# Patient Record
Sex: Female | Born: 1935
Health system: Southern US, Community
[De-identification: ages and names within clinical notes are randomized; demographics above are authoritative.]

## PROBLEM LIST (undated history)

## (undated) DIAGNOSIS — K745 Biliary cirrhosis, unspecified: Secondary | ICD-10-CM

## (undated) DIAGNOSIS — M109 Gout, unspecified: Secondary | ICD-10-CM

## (undated) DIAGNOSIS — Z85828 Personal history of other malignant neoplasm of skin: Secondary | ICD-10-CM

## (undated) DIAGNOSIS — N183 Chronic kidney disease, stage 3 unspecified: Secondary | ICD-10-CM

## (undated) DIAGNOSIS — E785 Hyperlipidemia, unspecified: Secondary | ICD-10-CM

## (undated) DIAGNOSIS — C44301 Unspecified malignant neoplasm of skin of nose: Secondary | ICD-10-CM

## (undated) DIAGNOSIS — L0390 Cellulitis, unspecified: Secondary | ICD-10-CM

## (undated) DIAGNOSIS — C44701 Unspecified malignant neoplasm of skin of unspecified lower limb, including hip: Secondary | ICD-10-CM

## (undated) DIAGNOSIS — E039 Hypothyroidism, unspecified: Secondary | ICD-10-CM

## (undated) DIAGNOSIS — K449 Diaphragmatic hernia without obstruction or gangrene: Secondary | ICD-10-CM

## (undated) DIAGNOSIS — N2581 Secondary hyperparathyroidism of renal origin: Secondary | ICD-10-CM

## (undated) DIAGNOSIS — Z8719 Personal history of other diseases of the digestive system: Secondary | ICD-10-CM

## (undated) DIAGNOSIS — I10 Essential (primary) hypertension: Secondary | ICD-10-CM

## (undated) HISTORY — DX: Chronic kidney disease, stage 3 (moderate): N18.3

## (undated) HISTORY — DX: Secondary hyperparathyroidism of renal origin: N25.81

## (undated) HISTORY — DX: Unspecified malignant neoplasm of skin of unspecified lower limb, including hip: C44.701

## (undated) HISTORY — PX: CATARACT EXTRACTION: SUR2

## (undated) HISTORY — PX: HEMORRHOID SURGERY: SHX153

## (undated) HISTORY — DX: Personal history of other diseases of the digestive system: Z87.19

## (undated) HISTORY — DX: Personal history of other malignant neoplasm of skin: Z85.828

## (undated) HISTORY — DX: Diaphragmatic hernia without obstruction or gangrene: K44.9

## (undated) HISTORY — DX: Unspecified malignant neoplasm of skin of nose: C44.301

## (undated) HISTORY — DX: Cellulitis, unspecified: L03.90

## (undated) HISTORY — DX: Hyperlipidemia, unspecified: E78.5

## (undated) HISTORY — DX: Hypothyroidism, unspecified: E03.9

## (undated) HISTORY — DX: Gout, unspecified: M10.9

## (undated) HISTORY — DX: Chronic kidney disease, stage 3 unspecified: N18.30

---

## 1993-09-26 HISTORY — PX: ABDOMINAL HYSTERECTOMY: SHX81

## 1997-09-26 HISTORY — PX: THYROID SURGERY: SHX805

## 1998-09-26 DIAGNOSIS — Z8719 Personal history of other diseases of the digestive system: Secondary | ICD-10-CM

## 1998-09-26 HISTORY — DX: Personal history of other diseases of the digestive system: Z87.19

## 1998-09-26 HISTORY — PX: FOOT SURGERY: SHX648

## 1998-11-05 ENCOUNTER — Ambulatory Visit (HOSPITAL_COMMUNITY): Admission: RE | Admit: 1998-11-05 | Discharge: 1998-11-05 | Payer: Self-pay

## 1999-11-09 ENCOUNTER — Ambulatory Visit (HOSPITAL_COMMUNITY): Admission: RE | Admit: 1999-11-09 | Discharge: 1999-11-09 | Payer: Self-pay | Admitting: Family Medicine

## 2004-11-13 ENCOUNTER — Emergency Department: Payer: Self-pay | Admitting: Unknown Physician Specialty

## 2004-11-14 ENCOUNTER — Ambulatory Visit: Payer: Self-pay

## 2006-06-12 ENCOUNTER — Ambulatory Visit: Payer: Self-pay | Admitting: Internal Medicine

## 2008-08-28 ENCOUNTER — Ambulatory Visit: Payer: Self-pay | Admitting: Internal Medicine

## 2012-12-26 ENCOUNTER — Ambulatory Visit: Payer: Self-pay | Admitting: Unknown Physician Specialty

## 2012-12-26 LAB — CREATININE, SERUM
Creatinine: 1.26 mg/dL (ref 0.60–1.30)
EGFR (African American): 47 — ABNORMAL LOW
EGFR (Non-African Amer.): 40 — ABNORMAL LOW

## 2014-06-03 ENCOUNTER — Ambulatory Visit: Payer: Self-pay | Admitting: Family Medicine

## 2014-06-03 DIAGNOSIS — R0602 Shortness of breath: Secondary | ICD-10-CM

## 2014-06-26 DIAGNOSIS — K449 Diaphragmatic hernia without obstruction or gangrene: Secondary | ICD-10-CM | POA: Insufficient documentation

## 2014-06-26 HISTORY — DX: Diaphragmatic hernia without obstruction or gangrene: K44.9

## 2014-07-21 ENCOUNTER — Ambulatory Visit: Payer: Self-pay | Admitting: Family Medicine

## 2014-09-05 ENCOUNTER — Ambulatory Visit: Payer: Self-pay | Admitting: Family Medicine

## 2015-03-10 ENCOUNTER — Emergency Department
Admission: EM | Admit: 2015-03-10 | Discharge: 2015-03-10 | Disposition: A | Discharge status: Home / Self Care | Payer: Commercial Managed Care - HMO | Attending: Emergency Medicine | Admitting: Emergency Medicine

## 2015-03-10 ENCOUNTER — Encounter: Payer: Self-pay | Admitting: Emergency Medicine

## 2015-03-10 ENCOUNTER — Telehealth: Payer: Self-pay | Admitting: Family Medicine

## 2015-03-10 DIAGNOSIS — J029 Acute pharyngitis, unspecified: Secondary | ICD-10-CM | POA: Diagnosis present

## 2015-03-10 DIAGNOSIS — I10 Essential (primary) hypertension: Secondary | ICD-10-CM | POA: Diagnosis not present

## 2015-03-10 DIAGNOSIS — J209 Acute bronchitis, unspecified: Secondary | ICD-10-CM | POA: Diagnosis not present

## 2015-03-10 HISTORY — DX: Essential (primary) hypertension: I10

## 2015-03-10 HISTORY — DX: Biliary cirrhosis, unspecified: K74.5

## 2015-03-10 LAB — POCT RAPID STREP A: Streptococcus, Group A Screen (Direct): NEGATIVE

## 2015-03-10 MED ORDER — MAGIC MOUTHWASH W/LIDOCAINE: 5.0000 mL | Freq: Four times a day (QID) | ORAL | Status: DC

## 2015-03-10 MED ORDER — PSEUDOEPH-BROMPHEN-DM 30-2-10 MG/5ML PO SYRP
5.0000 mL | ORAL_SOLUTION | Freq: Four times a day (QID) | ORAL | Status: DC | PRN
Start: 2015-03-10 — End: 2015-06-19

## 2015-03-10 MED ORDER — SULFAMETHOXAZOLE-TRIMETHOPRIM 800-160 MG PO TABS: 1.0000 | ORAL_TABLET | Freq: Two times a day (BID) | ORAL | Status: DC

## 2015-03-10 NOTE — Telephone Encounter (Signed)
No, I would not have any idea what to call in for her without evaluating her I agree with previous recommendation for urgent care We hope she is feeling better soon

## 2015-03-10 NOTE — ED Provider Notes (Signed)
[facility] Emergency Department Provider Note  ____________________________________________  Time seen: Approximately 12:39 PM  I have reviewed the triage vital signs and the nursing notes.   HISTORY  Chief Complaint Sore Throat    HPI Caitlin Jenkins is a 81 y.o. female chief complaint of sore throat for 3 days. Patient states she has a productive greenish brown cough. Patient states she's been having fever but no chills she denies any nausea vomiting diarrhea. Patient states she's been using throat spray and warm salt water gargles but has not helped at this time.   Past Medical History  Diagnosis Date  . Hypertension   . Biliary cirrhosis     There are no active problems to display for this patient.   History reviewed. No pertinent past surgical history.  No current outpatient prescriptions on file.  Allergies Review of patient's allergies indicates not on file.  No family history on file.  Social History History  Substance Use Topics  . Smoking status: Never Smoker   . Smokeless tobacco: Not on file  . Alcohol Use: No    Review of Systems Constitutional: No fever/chills Eyes: No visual changes. ENT sore throat Cardiovascular: Denies chest pain. Respiratory: Denies shortness of breath. Productive cough Gastrointestinal: No abdominal pain.  No nausea, no vomiting.  No diarrhea.  No constipation. Genitourinary: Negative for dysuria. Musculoskeletal: Negative for back pain. Skin: Negative for rash. Neurological: Negative for headaches, focal weakness or numbness. 10-point ROS otherwise negative.  ____________________________________________   PHYSICAL EXAM:  VITAL SIGNS: ED Triage Vitals  Enc Vitals Group     BP 03/10/15 1214 136/69 mmHg     Pulse --      Resp 03/10/15 1214 18     Temp 03/10/15 1214 97.9 F (36.6 C)     Temp Source 03/10/15 1214 Oral     SpO2 03/10/15 1214 97 %     Weight 03/10/15 1214 161 lb (73.029  kg)     Height 03/10/15 1214 5\' 5"  (1.651 m)     Head Cir --      Peak Flow --      Pain Score 03/10/15 1222 5     Pain Loc --      Pain Edu? --      Excl. in [place]? --     Constitutional: Alert and oriented. Well appearing and in no acute distress. Eyes: Conjunctivae are normal. PERRL. EOMI. Head: Atraumatic. Nose: No congestion/rhinnorhea. Mouth/Throat: Mucous membranes are moist.  Oropharynx non-erythematous. Neck: No stridor.  No deformity for nuchal range of motion nontender palpation. Hematological/Lymphatic/Immunilogical: No cervical lymphadenopathy. Cardiovascular: Normal rate, regular rhythm. Grossly normal heart sounds.  Good peripheral circulation. Respiratory: Normal respiratory effort.  No retractions. Lungs CTAB. Gastrointestinal: Soft and nontender. No distention. No abdominal bruits. No CVA tenderness. Musculoskeletal: No lower extremity tenderness nor edema.  No joint effusions. Neurologic:  Normal speech and language. No gross focal neurologic deficits are appreciated. Speech is normal. No gait instability. Skin:  Skin is warm, dry and intact. No rash noted. Psychiatric: Mood and affect are normal. Speech and behavior are normal.  ____________________________________________   LABS (all labs ordered are listed, but only abnormal results are displayed)  Labs Reviewed  CULTURE, GROUP A STREP ([facility] ONLY)   ____________________________________________  EKG  ____________________________________________  RADIOLOGY   ____________________________________________   PROCEDURES  Procedure(s) performed: None  Critical Care performed: No  ____________________________________________   INITIAL IMPRESSION / ASSESSMENT AND PLAN / ED COURSE  Pertinent labs &  imaging results that were available during my care of the patient were reviewed by me and considered in my medical decision making (see chart for details).  Pharyngitis and  bronchitis. ____________________________________________   FINAL CLINICAL IMPRESSION(S) / ED DIAGNOSES  Final diagnoses:  Acute pharyngitis, unspecified pharyngitis type  Acute bronchitis, unspecified organism      Sable Feil, PA-C 03/10/15 [place], PA-C 03/10/15 [place], MD 03/10/15 [phone]

## 2015-03-10 NOTE — Telephone Encounter (Signed)
Left message for patient to go to urgent care, no antibiotics called in without an appt.

## 2015-03-10 NOTE — ED Notes (Signed)
Pt reports that she developed a sore throat on Sunday. Pt has a cough that the phlem is greenish brown. She states that she has been running a fever. She has been using throat spray warm salt water and it isnt helping. She is able to speak in complete clear sentences.

## 2015-03-10 NOTE — Discharge Instructions (Signed)

## 2015-03-10 NOTE — Telephone Encounter (Signed)
Pt called stated she is very sick. Pt states she is having fever, chills, and a bad sore throat. I instructed pt we do not have any openings today and she may want to [facility] or an urgent care. Pt wants to know if something can be called in for her. Pharm is [facility]. Thanks.

## 2015-03-12 DIAGNOSIS — N183 Chronic kidney disease, stage 3 unspecified: Secondary | ICD-10-CM | POA: Insufficient documentation

## 2015-03-12 DIAGNOSIS — I10 Essential (primary) hypertension: Secondary | ICD-10-CM | POA: Insufficient documentation

## 2015-03-12 DIAGNOSIS — M109 Gout, unspecified: Secondary | ICD-10-CM | POA: Insufficient documentation

## 2015-03-12 DIAGNOSIS — E039 Hypothyroidism, unspecified: Secondary | ICD-10-CM | POA: Insufficient documentation

## 2015-03-12 DIAGNOSIS — E785 Hyperlipidemia, unspecified: Secondary | ICD-10-CM | POA: Insufficient documentation

## 2015-03-12 LAB — CULTURE, GROUP A STREP (THRC)

## 2015-03-13 ENCOUNTER — Encounter: Payer: Self-pay | Admitting: Family Medicine

## 2015-03-13 ENCOUNTER — Ambulatory Visit
Admission: RE | Admit: 2015-03-13 | Discharge: 2015-03-13 | Disposition: A | Discharge status: Home / Self Care | Payer: Commercial Managed Care - HMO | Source: Ambulatory Visit | Attending: Family Medicine | Admitting: Family Medicine

## 2015-03-13 ENCOUNTER — Ambulatory Visit (INDEPENDENT_AMBULATORY_CARE_PROVIDER_SITE_OTHER): Payer: Commercial Managed Care - HMO | Admitting: Family Medicine

## 2015-03-13 VITALS — BP 132/78 | HR 87 | Temp 97.6°F | Ht 63.0 in | Wt 161.0 lb

## 2015-03-13 DIAGNOSIS — J180 Bronchopneumonia, unspecified organism: Secondary | ICD-10-CM | POA: Diagnosis not present

## 2015-03-13 DIAGNOSIS — J189 Pneumonia, unspecified organism: Secondary | ICD-10-CM | POA: Diagnosis not present

## 2015-03-13 DIAGNOSIS — M1A0720 Idiopathic chronic gout, left ankle and foot, without tophus (tophi): Secondary | ICD-10-CM

## 2015-03-13 DIAGNOSIS — K449 Diaphragmatic hernia without obstruction or gangrene: Secondary | ICD-10-CM | POA: Insufficient documentation

## 2015-03-13 DIAGNOSIS — I10 Essential (primary) hypertension: Secondary | ICD-10-CM | POA: Diagnosis not present

## 2015-03-13 DIAGNOSIS — D485 Neoplasm of uncertain behavior of skin: Secondary | ICD-10-CM

## 2015-03-13 DIAGNOSIS — K743 Primary biliary cirrhosis: Secondary | ICD-10-CM | POA: Diagnosis not present

## 2015-03-13 DIAGNOSIS — E785 Hyperlipidemia, unspecified: Secondary | ICD-10-CM

## 2015-03-13 DIAGNOSIS — R05 Cough: Secondary | ICD-10-CM | POA: Diagnosis not present

## 2015-03-13 DIAGNOSIS — N183 Chronic kidney disease, stage 3 unspecified: Secondary | ICD-10-CM

## 2015-03-13 DIAGNOSIS — R059 Cough, unspecified: Secondary | ICD-10-CM

## 2015-03-13 DIAGNOSIS — E890 Postprocedural hypothyroidism: Secondary | ICD-10-CM

## 2015-03-13 NOTE — Assessment & Plan Note (Signed)
Suspect acute attack; check uric acid; avoid purines

## 2015-03-13 NOTE — Assessment & Plan Note (Signed)
BP controlled; avoid HCTZ

## 2015-03-13 NOTE — Progress Notes (Signed)
BP 132/78 mmHg  Pulse 87  Temp(Src) 97.6 F (36.4 C)  Ht 5\' 3"  (1.6 m)  Wt 161 lb (73.029 kg)  BMI 28.53 kg/m2  SpO2 96%   Subjective:    Patient ID: Caitlin Jenkins, female    DOB: Mar 04, 1932, 81 y.o.   MRN: [id number]  HPI: Caitlin Jenkins is a 81 y.o. female  Chief Complaint  Patient presents with  . URI     since Sunday, went to the ER and was given meds  . Foot Injury    knot on foot, gout?   She has been sick since Sunday, today is Friday Coughing and coughing, bringing up green ugly stuff; fever and chills She doesn't think she has been sicker than she has been this week She went to the ER earlier this week; was seen there and is better now; she was put on doxycycline; no chest xray; throat tests were negative for strep; throat is better now No fevers since starting antibiotic but had some chills No rash No travel No recent visits to hospital or nursing home Daughter was with her on trip to [place] and had a sore throat, no bronchitis; one of daughter's grandchildren who lived with her had had strep  She also has something on her left foot and right index finger; ankles hurt so bad, all she could to walk to the bathroom; was using a walker yesterday; right index finger swollen and painful and limited ROM with flexion; tender spot on medial left foot; she used up all of her biofreeze and used some arthritis cream on her foot twice; quit using it when it turned red; not taking any gout medicine though; previous attack of gout was a long time ago since eating cherries; she says she is the "gout queen"; taking tylenol; has colchicine to take when flares come up  She has been taking only one of the ursodiol and would like liver tests checked again  Relevant past medical, surgical, family and social history reviewed and updated as indicated. Interim medical history since our last visit reviewed. Allergies and medications reviewed and updated.  Review of Systems   Constitutional: Positive for fever (but not since starting antibiotics), chills and fatigue.  HENT: Positive for sneezing (just once in a while) and sore throat. Negative for ear pain and mouth sores.   Eyes: Positive for discharge (worse in the mornings).  Respiratory: Positive for cough (productive of greenish sputum) and shortness of breath (trying to take deep breaths).   Skin:       Bump on the right ear, been there six months   Per HPI unless specifically indicated above  The patient does not have a history of falls Depression screen [facility] 2/9 03/13/2015  Decreased Interest 0  Down, Depressed, Hopeless 0  PHQ - 2 Score 0       Objective:    BP 132/78 mmHg  Pulse 87  Temp(Src) 97.6 F (36.4 C)  Ht 5\' 3"  (1.6 m)  Wt 161 lb (73.029 kg)  BMI 28.53 kg/m2  SpO2 96%  Wt Readings from Last 3 Encounters:  03/13/15 161 lb (73.029 kg)  11/24/14 167 lb (75.751 kg)  03/10/15 161 lb (73.029 kg)    Physical Exam  Constitutional: She appears well-developed and well-nourished. No distress.  HENT:  Head: Normocephalic and atraumatic.  Right Ear: There is drainage and swelling. Tympanic membrane is injected, erythematous and retracted. A middle ear effusion is present. Decreased hearing is noted.  Left Ear: There is drainage and swelling. Tympanic membrane is injected, erythematous and retracted. A middle ear effusion is present. Decreased hearing is noted.  Nose: No mucosal edema, rhinorrhea or septal deviation. No epistaxis.  Mouth/Throat: Uvula is midline, oropharynx is clear and moist and mucous membranes are normal.  Slightly erythematous papule on the lower antehelix of the right ear, telangiectasia present  Eyes: EOM are normal. No scleral icterus.  Neck: Neck supple. No tracheal deviation present. No thyromegaly present.  Cardiovascular: Normal rate, regular rhythm and normal heart sounds.   No murmur heard. Pulmonary/Chest: Effort normal. No respiratory distress. She has no  wheezes. She has rales (right lower lobe with decreased breath sounds).  Abdominal: Soft. She exhibits no distension.  Musculoskeletal: Normal range of motion. She exhibits no edema.  Lymphadenopathy:    She has no cervical adenopathy.  Neurological: She is alert. She exhibits normal muscle tone.  Skin: Skin is warm and dry. She is not diaphoretic. No pallor.  Psychiatric: She has a normal mood and affect. Her behavior is normal. Judgment and thought content normal.   Reviewed ER labs: Results for orders placed or performed during the hospital encounter of 03/10/15  Culture, group A strep ([place] only)  Result Value Ref Range   Specimen Description THROAT    Special Requests NONE    Culture      NO BETA HEMOLYTIC STREPTOCOCCI ISOLATED IN 64 HOURS   Report Status 03/12/2015 FINAL   POCT rapid strep A [facility])  Result Value Ref Range   Streptococcus, Group A Screen (Direct) NEGATIVE NEGATIVE      Assessment & Plan:   Problem List Items Addressed This Visit      Cardiovascular and Mediastinum   Hypertension    BP controlled; avoid HCTZ        Digestive   Primary biliary cirrhosis    Pt only taking ursodiol once a day; check levels of liver enzymes at her request      Relevant Orders   Comprehensive metabolic panel     Endocrine   Hypothyroidism    Last TSH in Feb was normal; check yearly or if symptomatic        Genitourinary   CKD (chronic kidney disease) stage 3, GFR 30-59 ml/min    Avoid NSAIDs; stay hydrated; check GFR and Cr today        Other   Hyperlipidemia    Patient wishes to avoid pills; avoid / limit saturated fats; politely declined offer to check lipids today      Gout    Suspect acute attack; check uric acid; avoid purines      Relevant Orders   Uric acid    Other Visit Diagnoses    Cough    -  Primary    Relevant Orders    DG Chest 2 View    CBC with Differential/Platelet    Bronchopneumonia        Relevant Medications     doxycycline (VIBRAMYCIN) 100 MG capsule    Other Relevant Orders    DG Chest 2 View    CBC with Differential/Platelet    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 doxycycline (VIBRAMYCIN) 100 MG capsule  Sig:   . DISCONTD: STOOL SOFTENER 100 MG capsule    Sig: 2 (two) times daily.  . Multiple Vitamins-Minerals (SENIOR MULTIVITAMIN PLUS PO)    Sig:      Follow up plan: Return in about 3 months (around 06/13/2015) for regular follow-up.

## 2015-03-13 NOTE — Assessment & Plan Note (Signed)
Patient wishes to avoid pills; avoid / limit saturated fats; politely declined offer to check lipids today

## 2015-03-13 NOTE — Assessment & Plan Note (Signed)
Pt only taking ursodiol once a day; check levels of liver enzymes at her request

## 2015-03-13 NOTE — Assessment & Plan Note (Signed)
Last TSH in Feb was normal; check yearly or if symptomatic

## 2015-03-13 NOTE — Assessment & Plan Note (Signed)
Avoid NSAIDs; stay hydrated; check GFR and Cr today

## 2015-03-13 NOTE — Patient Instructions (Addendum)
Please have the chest xray done If you feel worse over the weekend, contact our nurse triage line through our main number [phone] and ask to speak to the doctor covering if needed Hydration, vitamin C, green tea, and rest are recommended We'll let you know about the lab results and chest xray results when they are received Avoid NSAIDs If you need something for aches or pains, try to use Tylenol (acetaminphen) instead of non-steroidals (which include Aleve, ibuprofen, Advil, Motrin, and naproxen); non-steroidals can cause long-term kidney damage You can safely take up to 2,000 mg of acetaminophen if you absolutely need something for pain and/or fever Avoid / limit foods rich in purines (chicken, chicken soup, [place], gravies, etc.)   Gout Gout is an inflammatory arthritis caused by a buildup of uric acid crystals in the joints. Uric acid is a chemical that is normally present in the blood. When the level of uric acid in the blood is too high it can form crystals that deposit in your joints and tissues. This causes joint redness, soreness, and swelling (inflammation). Repeat attacks are common. Over time, uric acid crystals can form into masses (tophi) near a joint, destroying bone and causing disfigurement. Gout is treatable and often preventable. CAUSES  The disease begins with elevated levels of uric acid in the blood. Uric acid is produced by your body when it breaks down a naturally found substance called purines. Certain foods you eat, such as meats and fish, contain high amounts of purines. Causes of an elevated uric acid level include:  Being passed down from parent to child (heredity).  Diseases that cause increased uric acid production (such as obesity, psoriasis, and certain cancers).  Excessive alcohol use.  Diet, especially diets rich in meat and seafood.  Medicines, including certain cancer-fighting medicines (chemotherapy), water pills (diuretics), and aspirin.  Chronic  kidney disease. The kidneys are no longer able to remove uric acid well.  Problems with metabolism. Conditions strongly associated with gout include:  Obesity.  High blood pressure.  High cholesterol.  Diabetes. Not everyone with elevated uric acid levels gets gout. It is not understood why some people get gout and others do not. Surgery, joint injury, and eating too much of certain foods are some of the factors that can lead to gout attacks. SYMPTOMS   An attack of gout comes on quickly. It causes intense pain with redness, swelling, and warmth in a joint.  Fever can occur.  Often, only one joint is involved. Certain joints are more commonly involved:  Base of the big toe.  Knee.  Ankle.  Wrist.  Finger. Without treatment, an attack usually goes away in a few days to weeks. Between attacks, you usually will not have symptoms, which is different from many other forms of arthritis. DIAGNOSIS  Your caregiver will suspect gout based on your symptoms and exam. In some cases, tests may be recommended. The tests may include:  Blood tests.  Urine tests.  X-rays.  Joint fluid exam. This exam requires a needle to remove fluid from the joint (arthrocentesis). Using a microscope, gout is confirmed when uric acid crystals are seen in the joint fluid. TREATMENT  There are two phases to gout treatment: treating the sudden onset (acute) attack and preventing attacks (prophylaxis).  Treatment of an Acute Attack.  Medicines are used. These include anti-inflammatory medicines or steroid medicines.  An injection of steroid medicine into the affected joint is sometimes necessary.  The painful joint is rested. Movement can worsen the  arthritis.  You may use warm or cold treatments on painful joints, depending which works best for you.  Treatment to Prevent Attacks.  If you suffer from frequent gout attacks, your caregiver may advise preventive medicine. These medicines are started  after the acute attack subsides. These medicines either help your kidneys eliminate uric acid from your body or decrease your uric acid production. You may need to stay on these medicines for a very long time.  The early phase of treatment with preventive medicine can be associated with an increase in acute gout attacks. For this reason, during the first few months of treatment, your caregiver may also advise you to take medicines usually used for acute gout treatment. Be sure you understand your caregiver's directions. Your caregiver may make several adjustments to your medicine dose before these medicines are effective.  Discuss dietary treatment with your caregiver or dietitian. Alcohol and drinks high in sugar and fructose and foods such as meat, poultry, and seafood can increase uric acid levels. Your caregiver or dietitian can advise you on drinks and foods that should be limited. HOME CARE INSTRUCTIONS   Do not take aspirin to relieve pain. This raises uric acid levels.  Only take over-the-counter or prescription medicines for pain, discomfort, or fever as directed by your caregiver.  Rest the joint as much as possible. When in bed, keep sheets and blankets off painful areas.  Keep the affected joint raised (elevated).  Apply warm or cold treatments to painful joints. Use of warm or cold treatments depends on which works best for you.  Use crutches if the painful joint is in your leg.  Drink enough fluids to keep your urine clear or pale yellow. This helps your body get rid of uric acid. Limit alcohol, sugary drinks, and fructose drinks.  Follow your dietary instructions. Pay careful attention to the amount of protein you eat. Your daily diet should emphasize fruits, vegetables, whole grains, and fat-free or low-fat milk products. Discuss the use of coffee, vitamin C, and cherries with your caregiver or dietitian. These may be helpful in lowering uric acid levels.  Maintain a healthy  body weight. SEEK MEDICAL CARE IF:   You develop diarrhea, vomiting, or any side effects from medicines.  You do not feel better in 24 hours, or you are getting worse. SEEK IMMEDIATE MEDICAL CARE IF:   Your joint becomes suddenly more tender, and you have chills or a fever. MAKE SURE YOU:   Understand these instructions.  Will watch your condition.  Will get help right away if you are not doing well or get worse. Document Released: 09/09/2000 Document Revised: 01/27/2014 Document Reviewed: 04/25/2012 [facility] Patient Information 2015 [place], [place]. This information is not intended to replace advice given to you by your health care provider. Make sure you discuss any questions you have with your health care provider.

## 2015-03-14 LAB — CBC WITH DIFFERENTIAL/PLATELET
BASOS ABS: 0 10*3/uL (ref 0.0–0.2)
Basos: 0 %
EOS (ABSOLUTE): 0.2 10*3/uL (ref 0.0–0.4)
EOS: 2 %
HEMOGLOBIN: 12.3 g/dL (ref 11.1–15.9)
Hematocrit: 36.9 % (ref 34.0–46.6)
Immature Grans (Abs): 0.2 10*3/uL — ABNORMAL HIGH (ref 0.0–0.1)
Immature Granulocytes: 1 %
Lymphocytes Absolute: 2 10*3/uL (ref 0.7–3.1)
Lymphs: 15 %
MCH: 27.1 pg (ref 26.6–33.0)
MCHC: 33.3 g/dL (ref 31.5–35.7)
MCV: 81 fL (ref 79–97)
MONOS ABS: 1.4 10*3/uL — AB (ref 0.1–0.9)
Monocytes: 10 %
NEUTROS ABS: 9.9 10*3/uL — AB (ref 1.4–7.0)
NEUTROS PCT: 72 %
Platelets: 385 10*3/uL — ABNORMAL HIGH (ref 150–379)
RBC: 4.54 x10E6/uL (ref 3.77–5.28)
RDW: 14.1 % (ref 12.3–15.4)
WBC: 13.8 10*3/uL — ABNORMAL HIGH (ref 3.4–10.8)

## 2015-03-14 LAB — URIC ACID: Uric Acid: 8.3 mg/dL — ABNORMAL HIGH (ref 2.5–7.1)

## 2015-03-14 LAB — COMPREHENSIVE METABOLIC PANEL
A/G RATIO: 1.1 (ref 1.1–2.5)
ALT: 7 IU/L (ref 0–32)
AST: 12 IU/L (ref 0–40)
Albumin: 3.9 g/dL (ref 3.5–4.7)
Alkaline Phosphatase: 128 IU/L — ABNORMAL HIGH (ref 39–117)
BILIRUBIN TOTAL: 0.3 mg/dL (ref 0.0–1.2)
BUN/Creatinine Ratio: 18 (ref 11–26)
BUN: 27 mg/dL (ref 8–27)
CO2: 23 mmol/L (ref 18–29)
Calcium: 9.8 mg/dL (ref 8.7–10.3)
Chloride: 93 mmol/L — ABNORMAL LOW (ref 97–108)
Creatinine, Ser: 1.46 mg/dL — ABNORMAL HIGH (ref 0.57–1.00)
GFR, EST AFRICAN AMERICAN: 39 mL/min/{1.73_m2} — AB (ref >59)
GFR, EST NON AFRICAN AMERICAN: 34 mL/min/{1.73_m2} — AB (ref >59)
GLUCOSE: 106 mg/dL — AB (ref 65–99)
Globulin, Total: 3.4 g/dL (ref 1.5–4.5)
Potassium: 4.3 mmol/L (ref 3.5–5.2)
SODIUM: 129 mmol/L — AB (ref 134–144)
TOTAL PROTEIN: 7.3 g/dL (ref 6.0–8.5)

## 2015-03-16 ENCOUNTER — Telehealth: Payer: Self-pay | Admitting: Family Medicine

## 2015-03-16 DIAGNOSIS — J181 Lobar pneumonia, unspecified organism: Principal | ICD-10-CM

## 2015-03-16 DIAGNOSIS — J189 Pneumonia, unspecified organism: Secondary | ICD-10-CM

## 2015-03-16 NOTE — Telephone Encounter (Signed)
Please let patient know I tried to call her a couple of times Saturday, and there was a problem with her line I think I'd like to see her back in the office next week mid-week to see her again, recheck some labs, and have her repeat a CXR IF she'd like to get the CXR a few hours before her visit or even the day before, we would have it to go over

## 2015-03-17 ENCOUNTER — Other Ambulatory Visit: Payer: Self-pay

## 2015-03-17 DIAGNOSIS — D485 Neoplasm of uncertain behavior of skin: Secondary | ICD-10-CM

## 2015-03-17 MED ORDER — LOSARTAN POTASSIUM 100 MG PO TABS: 100.0000 mg | ORAL_TABLET | Freq: Every day | ORAL | Status: DC

## 2015-03-17 NOTE — Telephone Encounter (Signed)
She needs a 90 day supply of Losartan sent to mail order. She also needs a referral to Dr. Evorn Gong at [facility] for skin check. She has appointment scheduled, just needs referral.

## 2015-03-17 NOTE — Telephone Encounter (Signed)
Patient notified, appointment scheduled for Monday, June 27th. She will go get xray before she comes here.

## 2015-03-20 ENCOUNTER — Telehealth: Payer: Self-pay | Admitting: Family Medicine

## 2015-03-20 ENCOUNTER — Ambulatory Visit: Admission: RE | Admit: 2015-03-20 | Payer: Commercial Managed Care - HMO | Source: Ambulatory Visit

## 2015-03-20 ENCOUNTER — Ambulatory Visit
Admission: RE | Admit: 2015-03-20 | Discharge: 2015-03-20 | Disposition: A | Discharge status: Home / Self Care | Payer: Commercial Managed Care - HMO | Source: Ambulatory Visit | Attending: Family Medicine | Admitting: Family Medicine

## 2015-03-20 DIAGNOSIS — J189 Pneumonia, unspecified organism: Secondary | ICD-10-CM | POA: Diagnosis not present

## 2015-03-20 DIAGNOSIS — J181 Lobar pneumonia, unspecified organism: Principal | ICD-10-CM

## 2015-03-20 DIAGNOSIS — K449 Diaphragmatic hernia without obstruction or gangrene: Secondary | ICD-10-CM | POA: Diagnosis not present

## 2015-03-20 NOTE — Telephone Encounter (Signed)
I talked with patient; pneumonia is improving She will come in to appt on monday

## 2015-03-23 ENCOUNTER — Ambulatory Visit (INDEPENDENT_AMBULATORY_CARE_PROVIDER_SITE_OTHER): Payer: Commercial Managed Care - HMO | Admitting: Family Medicine

## 2015-03-23 ENCOUNTER — Encounter: Payer: Self-pay | Admitting: Family Medicine

## 2015-03-23 VITALS — BP 143/78 | HR 86 | Temp 97.5°F | Wt 161.0 lb

## 2015-03-23 DIAGNOSIS — J189 Pneumonia, unspecified organism: Secondary | ICD-10-CM | POA: Diagnosis not present

## 2015-03-23 DIAGNOSIS — E878 Other disorders of electrolyte and fluid balance, not elsewhere classified: Secondary | ICD-10-CM

## 2015-03-23 DIAGNOSIS — E871 Hypo-osmolality and hyponatremia: Secondary | ICD-10-CM

## 2015-03-23 DIAGNOSIS — N183 Chronic kidney disease, stage 3 unspecified: Secondary | ICD-10-CM

## 2015-03-23 DIAGNOSIS — J181 Lobar pneumonia, unspecified organism: Secondary | ICD-10-CM

## 2015-03-23 DIAGNOSIS — M1A0721 Idiopathic chronic gout, left ankle and foot, with tophus (tophi): Secondary | ICD-10-CM

## 2015-03-23 DIAGNOSIS — K743 Primary biliary cirrhosis: Secondary | ICD-10-CM | POA: Diagnosis not present

## 2015-03-23 LAB — MICROALBUMIN, URINE WAIVED
Creatinine, Urine Waived: 50 mg/dL (ref 10–300)
Microalb, Ur Waived: 30 mg/L — ABNORMAL HIGH (ref 0–19)

## 2015-03-23 LAB — MICROSCOPIC EXAMINATION: RBC MICROSCOPIC, UA: NONE SEEN /HPF (ref 0–?)

## 2015-03-23 MED ORDER — LEVOTHYROXINE SODIUM 112 MCG PO TABS
112.0000 ug | ORAL_TABLET | Freq: Every day | ORAL | Status: DC
Start: 2015-03-23 — End: 2015-06-19

## 2015-03-23 NOTE — Assessment & Plan Note (Signed)
Reviewed SGOT, SGPT, and alk phos and much better than last fall; pt to check in with GI doctor to see if appt needed every 6-12 months

## 2015-03-23 NOTE — Assessment & Plan Note (Signed)
Chronic and probably strong genetic component, likely getting worse as kidney function declined; suggest nephrology visit for consultation and medication recommendations; avoid NSAIDs; she is already following a diet low in purines

## 2015-03-23 NOTE — Assessment & Plan Note (Signed)
Avoid NSAIDs; recheck creatinine and GFR today; hydration encourged; refer to kidney doctor for consultation for renal function in conjunction with gout

## 2015-03-23 NOTE — Progress Notes (Signed)
BP 143/78 mmHg  Pulse 86  Temp(Src) 97.5 F (36.4 C)  Wt 161 lb (73.029 kg)  SpO2 98%   Subjective:    Patient ID: Caitlin Jenkins, female    DOB: 1932-01-03, 81 y.o.   MRN: [id number]  HPI: KALIANA ALBINO is a 81 y.o. female  Chief Complaint  Patient presents with  . Pneumonia    recheck   Patient here for f/u of RLL pneumonia; feeling much better Energy is back to about 85% of the original level No discolored phlegm No fevers No rash Finished up antibiotics on Thursday She had her f/u CXR last week, already discussed  She's had gout chronically for years Reviewed uric acid; last gout flare a week ago or a little more; while sick with pneumonia; she is really watching her diet, knows to avoid [place] and gravies, etc.  CKD, stage 3; avoiding NSAIDs; reviewed recent readings; she has never seen a kidney doctor   Relevant past medical, surgical, family and social history reviewed and updated as indicated. Interim medical history since our last visit reviewed. Allergies and medications reviewed and updated.  Review of Systems  Constitutional: Positive for fatigue (energy level back to 85% original level). Negative for fever.   Per HPI unless specifically indicated above     Objective:    BP 143/78 mmHg  Pulse 86  Temp(Src) 97.5 F (36.4 C)  Wt 161 lb (73.029 kg)  SpO2 98%  Wt Readings from Last 3 Encounters:  03/23/15 161 lb (73.029 kg)  03/13/15 161 lb (73.029 kg)  11/24/14 167 lb (75.751 kg)    Physical Exam  Constitutional: She appears well-developed and well-nourished. No distress.  HENT:  Head: Normocephalic and atraumatic.  Mouth/Throat: No oropharyngeal exudate.  Eyes: EOM are normal. No scleral icterus.  Neck: No JVD present.  Pulmonary/Chest: Effort normal and breath sounds normal. No respiratory distress. She has no wheezes. She has no rales.  Abdominal: She exhibits no distension.  Musculoskeletal: She exhibits no edema.       Left  foot: There is deformity (bony enlargement medial aspect left foot).  Neurological: She is alert.  Skin: She is not diaphoretic.   Results for orders placed or performed in visit on 03/13/15  Uric acid  Result Value Ref Range   Uric Acid 8.3 (H) 2.5 - 7.1 mg/dL  Comprehensive metabolic panel  Result Value Ref Range   Glucose 106 (H) 65 - 99 mg/dL   BUN 27 8 - 27 mg/dL   Creatinine, Ser 1.46 (H) 0.57 - 1.00 mg/dL   GFR calc non Af Amer 34 (L) >59 mL/min/1.73   GFR calc Af Amer 39 (L) >59 mL/min/1.73   BUN/Creatinine Ratio 18 11 - 26   Sodium 129 (L) 134 - 144 mmol/L   Potassium 4.3 3.5 - 5.2 mmol/L   Chloride 93 (L) 97 - 108 mmol/L   CO2 23 18 - 29 mmol/L   Calcium 9.8 8.7 - 10.3 mg/dL   Total Protein 7.3 6.0 - 8.5 g/dL   Albumin 3.9 3.5 - 4.7 g/dL   Globulin, Total 3.4 1.5 - 4.5 g/dL   Albumin/Globulin Ratio 1.1 1.1 - 2.5   Bilirubin Total 0.3 0.0 - 1.2 mg/dL   Alkaline Phosphatase 128 (H) 39 - 117 IU/L   AST 12 0 - 40 IU/L   ALT 7 0 - 32 IU/L  CBC with Differential/Platelet  Result Value Ref Range   WBC 13.8 (H) 3.4 - 10.8 x10E3/uL  RBC 4.54 3.77 - 5.28 x10E6/uL   Hemoglobin 12.3 11.1 - 15.9 g/dL   Hematocrit 36.9 34.0 - 46.6 %   MCV 81 79 - 97 fL   MCH 27.1 26.6 - 33.0 pg   MCHC 33.3 31.5 - 35.7 g/dL   RDW 14.1 12.3 - 15.4 %   Platelets 385 (H) 150 - 379 x10E3/uL   NEUTROPHILS 72 %   Lymphs 15 %   Monocytes 10 %   Eos 2 %   Basos 0 %   Neutrophils Absolute 9.9 (H) 1.4 - 7.0 x10E3/uL   Lymphocytes Absolute 2.0 0.7 - 3.1 x10E3/uL   Monocytes Absolute 1.4 (H) 0.1 - 0.9 x10E3/uL   EOS (ABSOLUTE) 0.2 0.0 - 0.4 x10E3/uL   Basophils Absolute 0.0 0.0 - 0.2 x10E3/uL   Immature Granulocytes 1 %   Immature Grans (Abs) 0.2 (H) 0.0 - 0.1 x10E3/uL      Assessment & Plan:   Problem List Items Addressed This Visit      Digestive   Primary biliary cirrhosis    Reviewed SGOT, SGPT, and alk phos and much better than last fall; pt to check in with GI doctor to see if appt  needed every 6-12 months        Genitourinary   CKD (chronic kidney disease) stage 3, GFR 30-59 ml/min    Avoid NSAIDs; recheck creatinine and GFR today; hydration encourged; refer to kidney doctor for consultation for renal function in conjunction with gout      Relevant Orders   Basic Metabolic Panel (BMET)   UA/M w/rflx Culture, Routine   Microalbumin / creatinine urine ratio     Other   Gout - Primary    Chronic and probably strong genetic component, likely getting worse as kidney function declined; suggest nephrology visit for consultation and medication recommendations; avoid NSAIDs; she is already following a diet low in purines       Other Visit Diagnoses    Right lower lobe pneumonia        so glad this is resolving; PCV-13 and PPSV-23 are up-to-date    Hyponatremia        recheck BMP today, suspect related to recent illness    Hypochloremia        recheck BMP today, suspect related to recent illness       Orders Placed This Encounter  Procedures  . Basic Metabolic Panel (BMET)  . UA/M w/rflx Culture, Routine  . Microalbumin / creatinine urine ratio    Follow up plan: Return in about 3 months (around 06/23/2015) for regular follow-up. An After Visit Summary was printed and given to the patient.

## 2015-03-23 NOTE — Patient Instructions (Signed)
We'll let you know about the appointment to see the kidney doctor (kidney function and gout) We'll let you know about the lab results Avoid NSAIDs and just use acetaminophen if needed for aches and pains, limit to 2,000 mg per day Do call Dr. Vira Agar to see if he'd like to see you every 6-12 months or so

## 2015-03-24 LAB — BASIC METABOLIC PANEL
BUN/Creatinine Ratio: 23 (ref 11–26)
BUN: 26 mg/dL (ref 8–27)
CO2: 19 mmol/L (ref 18–29)
CREATININE: 1.11 mg/dL — AB (ref 0.57–1.00)
Calcium: 9.4 mg/dL (ref 8.7–10.3)
Chloride: 98 mmol/L (ref 97–108)
GFR, EST AFRICAN AMERICAN: 54 mL/min/{1.73_m2} — AB (ref >59)
GFR, EST NON AFRICAN AMERICAN: 47 mL/min/{1.73_m2} — AB (ref >59)
GLUCOSE: 103 mg/dL — AB (ref 65–99)
Potassium: 5.3 mmol/L — ABNORMAL HIGH (ref 3.5–5.2)
Sodium: 136 mmol/L (ref 134–144)

## 2015-03-25 LAB — UA/M W/RFLX CULTURE, ROUTINE

## 2015-06-15 ENCOUNTER — Ambulatory Visit: Payer: Commercial Managed Care - HMO | Admitting: Family Medicine

## 2015-06-19 ENCOUNTER — Encounter: Payer: Self-pay | Admitting: Family Medicine

## 2015-06-19 ENCOUNTER — Ambulatory Visit: Payer: Commercial Managed Care - HMO | Admitting: Family Medicine

## 2015-06-19 ENCOUNTER — Ambulatory Visit (INDEPENDENT_AMBULATORY_CARE_PROVIDER_SITE_OTHER): Payer: Commercial Managed Care - HMO | Admitting: Family Medicine

## 2015-06-19 VITALS — BP 156/89 | HR 68 | Ht 63.25 in | Wt 164.0 lb

## 2015-06-19 DIAGNOSIS — J189 Pneumonia, unspecified organism: Secondary | ICD-10-CM | POA: Diagnosis not present

## 2015-06-19 DIAGNOSIS — Z23 Encounter for immunization: Secondary | ICD-10-CM | POA: Diagnosis not present

## 2015-06-19 DIAGNOSIS — I10 Essential (primary) hypertension: Secondary | ICD-10-CM | POA: Diagnosis not present

## 2015-06-19 DIAGNOSIS — R809 Proteinuria, unspecified: Secondary | ICD-10-CM | POA: Insufficient documentation

## 2015-06-19 DIAGNOSIS — R55 Syncope and collapse: Secondary | ICD-10-CM | POA: Diagnosis not present

## 2015-06-19 DIAGNOSIS — E890 Postprocedural hypothyroidism: Secondary | ICD-10-CM

## 2015-06-19 DIAGNOSIS — N183 Chronic kidney disease, stage 3 unspecified: Secondary | ICD-10-CM

## 2015-06-19 DIAGNOSIS — K743 Primary biliary cirrhosis: Secondary | ICD-10-CM

## 2015-06-19 DIAGNOSIS — E875 Hyperkalemia: Secondary | ICD-10-CM | POA: Diagnosis not present

## 2015-06-19 DIAGNOSIS — C44202 Unspecified malignant neoplasm of skin of right ear and external auricular canal: Secondary | ICD-10-CM

## 2015-06-19 DIAGNOSIS — J181 Lobar pneumonia, unspecified organism: Secondary | ICD-10-CM

## 2015-06-19 MED ORDER — LEVOTHYROXINE SODIUM 112 MCG PO TABS: 112.0000 ug | ORAL_TABLET | Freq: Every day | ORAL | Status: DC

## 2015-06-19 MED ORDER — AMLODIPINE BESYLATE 10 MG PO TABS: 10.0000 mg | ORAL_TABLET | Freq: Every day | ORAL | Status: DC

## 2015-06-19 MED ORDER — LOSARTAN POTASSIUM 100 MG PO TABS: 100.0000 mg | ORAL_TABLET | Freq: Every day | ORAL | Status: DC

## 2015-06-19 NOTE — Assessment & Plan Note (Signed)
Last TSH reviewed, normal; due for TSH recheck in Feb 2017 or just after; refills of meds given

## 2015-06-19 NOTE — Assessment & Plan Note (Signed)
She is awaiting Moh's procedure for this (likely [place])

## 2015-06-19 NOTE — Assessment & Plan Note (Signed)
Avoid fruit smoothies, high-K+ foods, explained why with her being on an ARB that she can retain more potassium than another person not on ARB

## 2015-06-19 NOTE — Progress Notes (Signed)
BP 156/89 mmHg  Pulse 68  Ht 5' 3.25" (1.607 m)  Wt 164 lb (74.39 kg)  BMI 28.81 kg/m2  SpO2 99%   Subjective:    Patient ID: Caitlin Jenkins, female    DOB: 12-11-1931, 81 y.o.   MRN: [id number]  HPI: Caitlin Jenkins is a 80 y.o. female  Chief Complaint  Patient presents with  . Hypertension    Needs refills Losartin and Amodipine  . Hypothyroidism    Needs refills on med   She went to see Dr. Vira Agar; she thinks she'll doing well; he said so too; kept her on the medicine; we could not find his August note in [place] or Care Everywhere; he will see again in March, no date set yet; she wonders if she needs another referral; soreness across her abdomen, sore; she did not talk to Dr. Vira Agar about it; he did check her liver and said it was not enlarged; she says she will just watch it, did not want to do anything  She is going to a new dentist; not sure if she needs a referral; she just goes once a year; no dental problems  She saw Dr. Evorn Gong, every six months for follow-up; hx of skin cancer and goes for regular follow-ups; he sent her to [facility] specialist but not in the network; she talked to [facility], and they have called her back from [facility] and it has been approved, she will go in October; she was advised by me to call [facility] and confirm; right ear (tragus)  Hypertension; insurance stuff gets her upset; she does not check blood pressure at home; trying to watch salt intake; does not resalt anything; does not cook much with salt, it would be in the food that eats out; does not want to cook often for just one person; she uses loratidine, just plain, no decongestants; has some postnasal drip and phlegm, maybe coming from milk  Hypothyroidism; weight has been stable, lost 10 pounds with pneumonia and kept it off; always has constipation, takes two colace every day; last colonoscopy years ago, does not want another; no blood in the stool  She was walking along in a store, then everything  went black as could be, like she would have passed out or fallen; her lips felt cold and tingly; it has happened before, been a good while; happens when working in the yard; no funny heart beats, no abnormal rhythm; she says it passes and she is okay; she held on to the cart; just lasted several seconds, maybe a minute; no chest pain; no weakness or numbness in extremities; no headache, no trouble speaking or swallowing; did not feel confused; sometimes she yawns and it catches in the right side of the neck; she then says that that episode was 6 months ago and she has not had it since; she thinks maybe dehydrated; she thinks she didn't skip a meal; only happens when working out in the yard or hot  Relevant past medical, surgical, family and social history reviewed and updated as indicated. Interim medical history since our last visit reviewed. Allergies and medications reviewed and updated.  Review of Systems Per HPI unless specifically indicated above     Objective:    BP 156/89 mmHg  Pulse 68  Ht 5' 3.25" (1.607 m)  Wt 164 lb (74.39 kg)  BMI 28.81 kg/m2  SpO2 99%  Wt Readings from Last 3 Encounters:  06/19/15 164 lb (74.39 kg)  03/23/15 161 lb (73.029 kg)  03/13/15 161 lb (73.029 kg)    Today's Vitals   06/19/15 1042 06/19/15 1135  BP: 166/88 156/89  Pulse: 80 68  Height: 5' 3.25" (1.607 m)   Weight: 164 lb (74.39 kg)   SpO2: 99%     Physical Exam  Constitutional: She appears well-developed and well-nourished. No distress.  HENT:  Head: Normocephalic and atraumatic.  Nose: No rhinorrhea.  Mouth/Throat: Mucous membranes are normal.  Domed flesh-to-erythematous colored papule on the left ear with telangiectasia  Eyes: EOM are normal. No scleral icterus.  Neck: Carotid bruit is not present. No thyromegaly present.  Cardiovascular: Normal rate, regular rhythm and normal heart sounds.   No murmur heard. Pulmonary/Chest: Effort normal and breath sounds normal. No respiratory  distress. She has no wheezes.  Abdominal: Soft. Bowel sounds are normal. She exhibits no distension.  Musculoskeletal: Normal range of motion. She exhibits no edema.  Neurological: She is alert. She exhibits normal muscle tone.  Skin: Skin is warm and dry. She is not diaphoretic. No pallor.  Psychiatric: She has a normal mood and affect. Her behavior is normal. Judgment and thought content normal.   Results for orders placed or performed in visit on 03/23/15  Microscopic Examination  Result Value Ref Range   WBC, UA 0-5 0 -  5 /hpf   RBC, UA None seen 0 -  2 /hpf   Epithelial Cells (non renal) 0-10 0 - 10 /hpf   Renal Epithel, UA 0-10 (A) None seen /hpf   Mucus, UA Present Not Estab.   Bacteria, UA Few None seen/Few  Basic Metabolic Panel (BMET)  Result Value Ref Range   Glucose 103 (H) 65 - 99 mg/dL   BUN 26 8 - 27 mg/dL   Creatinine, Ser 1.11 (H) 0.57 - 1.00 mg/dL   GFR calc non Af Amer 47 (L) >59 mL/min/1.73   GFR calc Af Amer 54 (L) >59 mL/min/1.73   BUN/Creatinine Ratio 23 11 - 26   Sodium 136 134 - 144 mmol/L   Potassium 5.3 (H) 3.5 - 5.2 mmol/L   Chloride 98 97 - 108 mmol/L   CO2 19 18 - 29 mmol/L   Calcium 9.4 8.7 - 10.3 mg/dL  UA/M w/rflx Culture, Routine  Result Value Ref Range   Urine Culture, Routine Final report    Urine Culture result 1 Comment   Microalbumin, Urine Waived  Result Value Ref Range   Microalb, Ur Waived 30 (H) 0 - 19 mg/L   Creatinine, Urine Waived 50 10 - 300 mg/dL   Microalb/Creat Ratio 30-300 (H) <30 mg/g      Assessment & Plan:   Problem List Items Addressed This Visit      Cardiovascular and Mediastinum   Near syncope    6 months ago; sounds as though she might have been dehydrated; EKG done today; did not show any evidence of cardiac conduction problems; no carotid bruits; I am reluctant to put her on a holter monitor if she hasn't had any symptoms for 6 months; she stay hydrated and seek care for new symptoms      Relevant  Medications   losartan (COZAAR) 100 MG tablet   amLODipine (NORVASC) 10 MG tablet   Other Relevant Orders   EKG 12-Lead (Completed)   Essential hypertension, benign - Primary    Not to goal; 1st BP was 166/88, recheck BP was 156/89; increase amlodipine; with her ARB, do NOT use salt substitutes; limit sodium, check sodium amount at restaurants; do not add extra  salt to food      Relevant Medications   losartan (COZAAR) 100 MG tablet   amLODipine (NORVASC) 10 MG tablet     Digestive   Primary biliary cirrhosis    She is worried about insurance issues, so I am documenting that I give unlimited approval for patient to see Dr. Vira Agar (GI) for this condition; should not need a new referral with every visit, but we can do so if needed; this is a chronic condition and she will need lifelong monitoring by a GI specialist        Endocrine   Hypothyroidism    Last TSH reviewed, normal; due for TSH recheck in Feb 2017 or just after; refills of meds given      Relevant Medications   levothyroxine (SYNTHROID, LEVOTHROID) 112 MCG tablet     Nervous and Auditory   Malignant neoplasm of right external ear    She is awaiting Moh's procedure for this (likely [facility])        Genitourinary   CKD (chronic kidney disease) stage 3, GFR 30-59 ml/min    Limit animal protein; stay hydrated; recheck GFR and creatinine on or after Sept 27th      Relevant Orders   Basic metabolic panel   Microalbumin / creatinine urine ratio     Other   Hyperkalemia    Avoid fruit smoothies, high-K+ foods, explained why with her being on an ARB that she can retain more potassium than another person not on ARB      Relevant Orders   Basic metabolic panel   Microalbuminuria    Continue ARB; limit animal protein      Relevant Orders   Basic metabolic panel   Microalbumin / creatinine urine ratio    Other Visit Diagnoses    Encounter for immunization        flu shot given today    Right lower lobe pneumonia            Follow up plan: Return after Sept 27th for labs, for early January to see me for visit.  Orders Placed This Encounter  Procedures  . Flu Vaccine QUAD 36+ mos IM  . Basic metabolic panel  . Microalbumin / creatinine urine ratio  . EKG 12-Lead   Meds ordered this encounter  Medications  . losartan (COZAAR) 100 MG tablet    Sig: Take 1 tablet (100 mg total) by mouth daily. Avoid salt substitutes, no fruit smoothies    Dispense:  90 tablet    Refill:  1  . levothyroxine (SYNTHROID, LEVOTHROID) 112 MCG tablet    Sig: Take 1 tablet (112 mcg total) by mouth daily before breakfast. Lab due October 2016    Dispense:  90 tablet    Refill:  1  . amLODipine (NORVASC) 10 MG tablet    Sig: Take 1 tablet (10 mg total) by mouth daily.    Dispense:  90 tablet    Refill:  3    I am increasing the dose to 10 mg

## 2015-06-19 NOTE — Assessment & Plan Note (Signed)
Continue ARB; limit animal protein

## 2015-06-19 NOTE — Patient Instructions (Addendum)
Try going completely dairy free for 2 weeks, all foods and liquids You can use dairy substitutes that are made from soy, cashews, almonds, coconut, etc. Check out [employer] NON-dairy ice cream You can try soy milk or almond milk [employer] Balance makes a butter / margarine substitute Limit red meat and try to get more plant-based proteins (kidney beans, chickpeas, lima beans, peas, tofu, tempeh, seitan, substitute hot dog, chick patties, veggie burgers Stay hydrated Avoid salt substitutes and fruit smoothies Return on or after Sept 27th for labs; check with Dr. Vira Agar to see if he wants me to do liver tests with my bloodwork Increase amlodipine from 5 mg to 10 mg daily Return end of December or early January

## 2015-06-19 NOTE — Assessment & Plan Note (Signed)
Not to goal; 1st BP was 166/88, recheck BP was 156/89; increase amlodipine; with her ARB, do NOT use salt substitutes; limit sodium, check sodium amount at restaurants; do not add extra salt to food

## 2015-06-19 NOTE — Assessment & Plan Note (Addendum)
She is worried about insurance issues, so I am documenting that I give unlimited approval for patient to see Dr. Vira Agar (GI) for this condition; should not need a new referral with every visit, but we can do so if needed; this is a chronic condition and she will need lifelong monitoring by a GI specialist

## 2015-06-19 NOTE — Assessment & Plan Note (Signed)
6 months ago; sounds as though she might have been dehydrated; EKG done today; did not show any evidence of cardiac conduction problems; no carotid bruits; I am reluctant to put her on a holter monitor if she hasn't had any symptoms for 6 months; she stay hydrated and seek care for new symptoms

## 2015-06-19 NOTE — Assessment & Plan Note (Signed)
Limit animal protein; stay hydrated; recheck GFR and creatinine on or after Sept 27th

## 2015-06-23 ENCOUNTER — Telehealth: Payer: Self-pay | Admitting: Family Medicine

## 2015-06-23 DIAGNOSIS — M1A0721 Idiopathic chronic gout, left ankle and foot, with tophus (tophi): Secondary | ICD-10-CM

## 2015-06-23 DIAGNOSIS — K743 Primary biliary cirrhosis: Secondary | ICD-10-CM

## 2015-06-23 NOTE — Telephone Encounter (Signed)
Pt wanted to let Lada know that Dr Tiffany Kocher would like to get labs for liver panel and she would like to be checked out for gout(euric acid)

## 2015-06-24 NOTE — Telephone Encounter (Signed)
New orders added; she still has old orders as well; can have all drawn together

## 2015-06-24 NOTE — Telephone Encounter (Signed)
Routing to provider  

## 2015-06-24 NOTE — Telephone Encounter (Signed)
Left detailed message for patient.

## 2015-06-24 NOTE — Assessment & Plan Note (Signed)
Check uric acid level 

## 2015-06-24 NOTE — Assessment & Plan Note (Signed)
Check LFTs 

## 2015-06-26 ENCOUNTER — Other Ambulatory Visit: Payer: Commercial Managed Care - HMO

## 2015-06-26 DIAGNOSIS — R809 Proteinuria, unspecified: Secondary | ICD-10-CM

## 2015-06-26 DIAGNOSIS — N183 Chronic kidney disease, stage 3 unspecified: Secondary | ICD-10-CM

## 2015-06-26 DIAGNOSIS — M1A0721 Idiopathic chronic gout, left ankle and foot, with tophus (tophi): Secondary | ICD-10-CM

## 2015-06-26 DIAGNOSIS — K743 Primary biliary cirrhosis: Secondary | ICD-10-CM

## 2015-06-26 DIAGNOSIS — E875 Hyperkalemia: Secondary | ICD-10-CM

## 2015-06-27 LAB — URIC ACID: Uric Acid: 8.2 mg/dL — ABNORMAL HIGH (ref 2.5–7.1)

## 2015-06-27 LAB — HEPATIC FUNCTION PANEL
ALT: 11 IU/L (ref 0–32)
AST: 21 IU/L (ref 0–40)
Albumin: 3.9 g/dL (ref 3.5–4.7)
Alkaline Phosphatase: 101 IU/L (ref 39–117)
BILIRUBIN, DIRECT: 0.1 mg/dL (ref 0.00–0.40)
Bilirubin Total: 0.4 mg/dL (ref 0.0–1.2)
Total Protein: 7 g/dL (ref 6.0–8.5)

## 2015-06-27 LAB — BASIC METABOLIC PANEL
BUN / CREAT RATIO: 14 (ref 11–26)
BUN: 18 mg/dL (ref 8–27)
CHLORIDE: 96 mmol/L — AB (ref 97–108)
CO2: 22 mmol/L (ref 18–29)
Calcium: 9.6 mg/dL (ref 8.7–10.3)
Creatinine, Ser: 1.28 mg/dL — ABNORMAL HIGH (ref 0.57–1.00)
GFR calc Af Amer: 45 mL/min/{1.73_m2} — ABNORMAL LOW (ref >59)
GFR calc non Af Amer: 39 mL/min/{1.73_m2} — ABNORMAL LOW (ref >59)
GLUCOSE: 121 mg/dL — AB (ref 65–99)
POTASSIUM: 4.9 mmol/L (ref 3.5–5.2)
SODIUM: 134 mmol/L (ref 134–144)

## 2015-06-27 LAB — MICROALBUMIN / CREATININE URINE RATIO
CREATININE, UR: 40.4 mg/dL
MICROALB/CREAT RATIO: 15.6 mg/g creat (ref 0.0–30.0)
Microalbumin, Urine: 6.3 ug/mL

## 2015-06-30 ENCOUNTER — Telehealth: Payer: Self-pay | Admitting: Family Medicine

## 2015-06-30 NOTE — Telephone Encounter (Signed)
I left msg, calling about labs

## 2015-07-01 NOTE — Telephone Encounter (Signed)
I left msg, calling about labs

## 2015-07-03 NOTE — Telephone Encounter (Signed)
I talked with patient She slacked off a little with her cherries; we discused meds vs dietary options for controlling uric acid; she will watch her diet instead of starting pill Liver enzymes normal Kidneys hanging steady; avoid NSAIDs She gave me her cell [phone] to try in future if needed (she was on vacation)

## 2015-08-27 ENCOUNTER — Telehealth: Payer: Self-pay

## 2015-08-27 NOTE — Telephone Encounter (Signed)
I spoke with patient, she was getting her hair done this morning, started feeling sick, got very hot. Managed to get home and immediately started having diarrhea and vomiting. She said she had sat on a stool to change her clothes and she states she "passed out" and woke up and she was on the floor. She said she was feeling a little better now. I advised patient that with her rapid symptoms and syncope episode and the fact that she is home alone, she needs to call 911 for further treatment and eval. I advised her that she could get worse and possibly pass out again and hit her head and no one would know, she seemed to understand and agreed to call 911 for further treatment.

## 2015-08-27 NOTE — Telephone Encounter (Signed)
I got a voice mail from a lady stating that the patient was very sick this morning and to call her. She did not leave her name or any contact info.

## 2015-09-10 ENCOUNTER — Telehealth: Payer: Self-pay | Admitting: Family Medicine

## 2015-09-10 NOTE — Telephone Encounter (Signed)
Pt called has question about labs she was supposed to have in October. Please call pt ASAP. Thanks.

## 2015-09-11 NOTE — Telephone Encounter (Signed)
I spoke with patient, she says she had a bottle of her thyroid med that said she needed labs in Oct., but she had enough and did not come in and since she has an appointment here in Jan, she will just wait and get labs done then.

## 2015-10-07 DIAGNOSIS — X32XXXA Exposure to sunlight, initial encounter: Secondary | ICD-10-CM | POA: Diagnosis not present

## 2015-10-07 DIAGNOSIS — C44612 Basal cell carcinoma of skin of right upper limb, including shoulder: Secondary | ICD-10-CM | POA: Diagnosis not present

## 2015-10-07 DIAGNOSIS — Z85828 Personal history of other malignant neoplasm of skin: Secondary | ICD-10-CM | POA: Diagnosis not present

## 2015-10-07 DIAGNOSIS — D485 Neoplasm of uncertain behavior of skin: Secondary | ICD-10-CM | POA: Diagnosis not present

## 2015-10-07 DIAGNOSIS — L570 Actinic keratosis: Secondary | ICD-10-CM | POA: Diagnosis not present

## 2015-10-07 DIAGNOSIS — L821 Other seborrheic keratosis: Secondary | ICD-10-CM | POA: Diagnosis not present

## 2015-10-09 ENCOUNTER — Ambulatory Visit
Admission: RE | Admit: 2015-10-09 | Discharge: 2015-10-09 | Disposition: A | Discharge status: Home / Self Care | Payer: PPO | Source: Ambulatory Visit | Attending: Family Medicine | Admitting: Family Medicine

## 2015-10-09 ENCOUNTER — Encounter: Payer: Self-pay | Admitting: Family Medicine

## 2015-10-09 ENCOUNTER — Ambulatory Visit (INDEPENDENT_AMBULATORY_CARE_PROVIDER_SITE_OTHER): Payer: PPO | Admitting: Family Medicine

## 2015-10-09 VITALS — BP 130/80 | HR 70 | Ht 63.25 in | Wt 165.0 lb

## 2015-10-09 DIAGNOSIS — K743 Primary biliary cirrhosis: Secondary | ICD-10-CM

## 2015-10-09 DIAGNOSIS — M2550 Pain in unspecified joint: Secondary | ICD-10-CM | POA: Insufficient documentation

## 2015-10-09 DIAGNOSIS — I779 Disorder of arteries and arterioles, unspecified: Secondary | ICD-10-CM | POA: Diagnosis not present

## 2015-10-09 DIAGNOSIS — E785 Hyperlipidemia, unspecified: Secondary | ICD-10-CM | POA: Diagnosis not present

## 2015-10-09 DIAGNOSIS — K449 Diaphragmatic hernia without obstruction or gangrene: Secondary | ICD-10-CM | POA: Insufficient documentation

## 2015-10-09 DIAGNOSIS — M1A0721 Idiopathic chronic gout, left ankle and foot, with tophus (tophi): Secondary | ICD-10-CM

## 2015-10-09 DIAGNOSIS — E890 Postprocedural hypothyroidism: Secondary | ICD-10-CM | POA: Diagnosis not present

## 2015-10-09 DIAGNOSIS — L609 Nail disorder, unspecified: Secondary | ICD-10-CM | POA: Insufficient documentation

## 2015-10-09 DIAGNOSIS — R55 Syncope and collapse: Secondary | ICD-10-CM | POA: Diagnosis not present

## 2015-10-09 DIAGNOSIS — Z5181 Encounter for therapeutic drug level monitoring: Secondary | ICD-10-CM

## 2015-10-09 DIAGNOSIS — I739 Peripheral vascular disease, unspecified: Principal | ICD-10-CM

## 2015-10-09 DIAGNOSIS — Z8701 Personal history of pneumonia (recurrent): Secondary | ICD-10-CM

## 2015-10-09 DIAGNOSIS — J189 Pneumonia, unspecified organism: Secondary | ICD-10-CM | POA: Diagnosis not present

## 2015-10-09 MED ORDER — AMLODIPINE BESYLATE 5 MG PO TABS: 5.0000 mg | ORAL_TABLET | Freq: Every day | ORAL | Status: DC

## 2015-10-09 NOTE — Assessment & Plan Note (Signed)
Order carotid US, she had previous screening test through [facility] that showed mild to moderate plaque

## 2015-10-09 NOTE — Patient Instructions (Addendum)
Decrease your amlodipine from 10 mg a day to 5 mg daily; you can cut the 10 mg pill in half to use up the pills you have, and then there will be 5 mg pills at your mail order pharmacy for the next prescription Monitor your blood pressure at home and call me if trending up above 150 on top We'll get the scan of your carotid arteries soon If you have another episode, call 911 and go to the hospital Try to limit saturated fats in your diet (bologna, hot dogs, barbeque, cheeseburgers, hamburgers, steak, bacon, sausage, cheese, etc.) and get more fresh fruits, vegetables, and whole grains We'll get labs today If you have not heard anything from my staff in a week about any orders/referrals/studies from today, please contact us here to follow-up ([phone] Please go to [facility] for the last chest xray

## 2015-10-09 NOTE — Progress Notes (Signed)
BP 130/80 mmHg  Pulse 70  Ht 5' 3.25" (1.607 m)  Wt 165 lb (74.844 kg)  BMI 28.98 kg/m2  SpO2 97%   Subjective:    Patient ID: Caitlin Jenkins, female    DOB: 1931/12/27, 82 y.o.   MRN: [id number]  HPI: [name] is a 82 y.o. female  Chief Complaint  Patient presents with  . Hypertension    "she's just checking me up"  . Hypothyroidism    wonders if her nail growth could be caused by her thyroid issues    Hypothyroidism; she notices nail changes; joint pains but no worse  Ganglion cyst on the right hand, affecting nerve in the right thumb; felt electric shock; cyst in the right  Chronic kidney disease; just aspirin and acetaminophen; stays well-hydrated (except for the GI incident, see below)  Hypertension; controlled today; uses regular salt if at all, just a little  Primary biliary cirrhosis; doing well; on medicine; sees GI doctor Dr. Vira Agar, last visit was in the fall, everything was good  I asked about fatty foods; she eats about 3 eggs per week; does cheese, not many hamburgers or cheeseburgers; does not eat steak often; found man who makes homemade sausage and has been having some of that Had a deviled egg this morning, sausage ball, pumpkin bread, coffee (black)  Relevant past medical, surgical, family and social history reviewed and updated as indicated Past Medical History  Diagnosis Date  . Biliary cirrhosis ([place])   . Hypertension   . Hyperlipidemia   . Hypothyroidism   . Gout   . Skin cancer of nose   . Cancer of skin of leg   . CKD (chronic kidney disease) stage 3, GFR 30-59 ml/min   . Hiatal hernia Oct 2015    8.5cm  . History of diverticulitis 2000   Interim medical history since our last visit reviewed. Phone note dated 08/27/15 said that patient had syncopal episode and was supposed to call 911 She went to get her hair fixed and when she got out of there, she got real hot; actually started home and didn't stop for food like she usually  dose; when she hit the back door, she hit the bathroom and the diarrhea started and she started vomiting; vomited all day and had diarrhea all day; went in her dressed room to change clothes again; sat on the stool and then passed out; she woke up and came to, then got up out of the floor, then called Butch Penny who used to be a [employer] scientist; she was so sick, she had friend call here, and staff here suggested she call EMS; EMS offered to take her to the hospital; she refused to go and brought in papers; the very next day, every thing was okay; the vomiting and diarrhea was all gone the next day; she thinks it was out of date applesauce, ate all of it; had a fried egg too before going; BP per EMS was 110/70, heart rate 78, respirations 20, and blood sugar 120; she has felt like she was going to pass out in the summer when she is working in the yard and bends over and then bends back up; that is not unusual that she feels like things are going black; she says they have checked her heart and she has had all the testing, the EKG, the echocardiogram, holter monitor; she does not want any more heart work-up  Allergies and medications reviewed and updated.  Review of Systems Per HPI  unless specifically indicated above     Objective:    BP 130/80 mmHg  Pulse 70  Ht 5' 3.25" (1.607 m)  Wt 165 lb (74.844 kg)  BMI 28.98 kg/m2  SpO2 97%  Wt Readings from Last 3 Encounters:  10/09/15 165 lb (74.844 kg)  06/19/15 164 lb (74.39 kg)  03/23/15 161 lb (73.029 kg)    Physical Exam  Constitutional: She appears well-developed and well-nourished. No distress.  HENT:  Head: Normocephalic and atraumatic.  Eyes: EOM are normal. No scleral icterus.  Neck: No thyromegaly present.  Cardiovascular: Normal rate, regular rhythm and normal heart sounds.   No murmur heard. Pulmonary/Chest: Effort normal and breath sounds normal. No respiratory distress. She has no wheezes.  Abdominal: Soft. She exhibits no distension. There is no  tenderness.  Musculoskeletal: She exhibits no edema.       Right hand: She exhibits decreased range of motion and bony tenderness. Decreased sensation noted.  Neurological: She is alert. She exhibits normal muscle tone.  Reflex Scores:      Patellar reflexes are 2+ on the right side and 2+ on the left side. Skin: Skin is warm and dry. No rash noted. She is not diaphoretic. No pallor.  Psychiatric: She has a normal mood and affect. Her behavior is normal. Judgment and thought content normal.      Assessment & Plan:   Problem List Items Addressed This Visit      Cardiovascular and Mediastinum   Near syncope    Happened again; reviewed EKG, echocardiogram, patient says she has had holter monitor; does not desire to see heart doctor; advised garden cart and to raise up slowly; stay hydrated; will check carotids      Relevant Medications   amLODipine (NORVASC) 5 MG tablet   Other Relevant Orders   CBC with Differential/Platelet (Completed)   Carotid artery disease ([place]) - Primary    Order carotid US, she had previous screening test through [facility] that showed mild to moderate plaque      Relevant Medications   amLODipine (NORVASC) 5 MG tablet   Other Relevant Orders   Lipid Panel w/o Chol/HDL Ratio (Completed)   US Carotid Duplex Bilateral (Completed)     Digestive   Primary biliary cirrhosis ([place])    Follow with Dr. Vira Agar, check labs today      Relevant Orders   Comprehensive metabolic panel (Completed)     Endocrine   Hypothyroidism    Check TSH today      Relevant Orders   TSH (Completed)     Musculoskeletal and Integument   Nail abnormalities   Relevant Orders   ANA w/Reflex if Positive (Completed)     Other   Hyperlipidemia    Check lipids; with known carotic atherosclerosis, new LDL goal is less than 70      Relevant Medications   amLODipine (NORVASC) 5 MG tablet   Gout    Check uric acid, continue tart cherries      Relevant Orders   Uric acid  (Completed)   Medication monitoring encounter   Arthralgia of multiple joints   Relevant Orders   ANA w/Reflex if Positive (Completed)   Hx of bacterial pneumonia   Relevant Orders   DG Chest 2 View (Completed)      Follow up plan: Return in about 3 weeks (around 10/30/2015).   An after-visit summary was printed and given to the patient at [facility].  Please see the patient instructions which may contain other information  and recommendations beyond what is mentioned above in the assessment and plan.  Orders Placed This Encounter  Procedures  . DG Chest 2 View  . US Carotid Duplex Bilateral  . Lipid Panel w/o Chol/HDL Ratio  . CBC with Differential/Platelet  . TSH  . Comprehensive metabolic panel  . Uric acid  . ANA w/Reflex if Positive

## 2015-10-09 NOTE — Assessment & Plan Note (Signed)
Check TSH today

## 2015-10-09 NOTE — Assessment & Plan Note (Signed)
Check lipids; with known carotic atherosclerosis, new LDL goal is less than 70

## 2015-10-09 NOTE — Assessment & Plan Note (Signed)
Check uric acid, continue tart cherries

## 2015-10-09 NOTE — Assessment & Plan Note (Signed)
Follow with Dr. Vira Agar, check labs today

## 2015-10-09 NOTE — Assessment & Plan Note (Signed)
Happened again; reviewed EKG, echocardiogram, patient says she has had holter monitor; does not desire to see heart doctor; advised garden cart and to raise up slowly; stay hydrated; will check carotids

## 2015-10-10 LAB — COMPREHENSIVE METABOLIC PANEL
A/G RATIO: 1.7 (ref 1.1–2.5)
ALT: 12 IU/L (ref 0–32)
AST: 24 IU/L (ref 0–40)
Albumin: 4.3 g/dL (ref 3.5–4.7)
Alkaline Phosphatase: 111 IU/L (ref 39–117)
BUN/Creatinine Ratio: 21 (ref 11–26)
BUN: 25 mg/dL (ref 8–27)
Bilirubin Total: 0.3 mg/dL (ref 0.0–1.2)
CALCIUM: 9.7 mg/dL (ref 8.7–10.3)
CO2: 22 mmol/L (ref 18–29)
CREATININE: 1.18 mg/dL — AB (ref 0.57–1.00)
Chloride: 99 mmol/L (ref 96–106)
GFR, EST AFRICAN AMERICAN: 50 mL/min/{1.73_m2} — AB (ref >59)
GFR, EST NON AFRICAN AMERICAN: 43 mL/min/{1.73_m2} — AB (ref >59)
Globulin, Total: 2.6 g/dL (ref 1.5–4.5)
Glucose: 109 mg/dL — ABNORMAL HIGH (ref 65–99)
POTASSIUM: 4.5 mmol/L (ref 3.5–5.2)
Sodium: 138 mmol/L (ref 134–144)
TOTAL PROTEIN: 6.9 g/dL (ref 6.0–8.5)

## 2015-10-10 LAB — LIPID PANEL W/O CHOL/HDL RATIO
CHOLESTEROL TOTAL: 236 mg/dL — AB (ref 100–199)
HDL: 50 mg/dL (ref >39)
LDL Calculated: 143 mg/dL — ABNORMAL HIGH (ref 0–99)
TRIGLYCERIDES: 215 mg/dL — AB (ref 0–149)
VLDL CHOLESTEROL CAL: 43 mg/dL — AB (ref 5–40)

## 2015-10-10 LAB — CBC WITH DIFFERENTIAL/PLATELET
BASOS ABS: 0 10*3/uL (ref 0.0–0.2)
BASOS: 0 %
EOS (ABSOLUTE): 0.3 10*3/uL (ref 0.0–0.4)
Eos: 3 %
HEMOGLOBIN: 12.6 g/dL (ref 11.1–15.9)
Hematocrit: 37.1 % (ref 34.0–46.6)
IMMATURE GRANS (ABS): 0 10*3/uL (ref 0.0–0.1)
Immature Granulocytes: 0 %
LYMPHS: 24 %
Lymphocytes Absolute: 2.2 10*3/uL (ref 0.7–3.1)
MCH: 28.2 pg (ref 26.6–33.0)
MCHC: 34 g/dL (ref 31.5–35.7)
MCV: 83 fL (ref 79–97)
MONOCYTES: 7 %
Monocytes Absolute: 0.6 10*3/uL (ref 0.1–0.9)
NEUTROS ABS: 6.1 10*3/uL (ref 1.4–7.0)
Neutrophils: 66 %
Platelets: 324 10*3/uL (ref 150–379)
RBC: 4.47 x10E6/uL (ref 3.77–5.28)
RDW: 16.5 % — ABNORMAL HIGH (ref 12.3–15.4)
WBC: 9.2 10*3/uL (ref 3.4–10.8)

## 2015-10-10 LAB — URIC ACID: Uric Acid: 7.8 mg/dL — ABNORMAL HIGH (ref 2.5–7.1)

## 2015-10-10 LAB — TSH: TSH: 0.95 u[IU]/mL (ref 0.450–4.500)

## 2015-10-10 LAB — ANA W/REFLEX IF POSITIVE: Anti Nuclear Antibody(ANA): NEGATIVE

## 2015-10-13 ENCOUNTER — Telehealth: Payer: Self-pay | Admitting: Family Medicine

## 2015-10-13 NOTE — Telephone Encounter (Signed)
I left msg; calling about labs

## 2015-10-20 ENCOUNTER — Ambulatory Visit
Admission: RE | Admit: 2015-10-20 | Discharge: 2015-10-20 | Disposition: A | Discharge status: Home / Self Care | Payer: PPO | Source: Ambulatory Visit | Attending: Family Medicine | Admitting: Family Medicine

## 2015-10-20 ENCOUNTER — Telehealth: Payer: Self-pay | Admitting: Family Medicine

## 2015-10-20 DIAGNOSIS — I779 Disorder of arteries and arterioles, unspecified: Secondary | ICD-10-CM | POA: Insufficient documentation

## 2015-10-20 DIAGNOSIS — I6523 Occlusion and stenosis of bilateral carotid arteries: Secondary | ICD-10-CM | POA: Insufficient documentation

## 2015-10-20 DIAGNOSIS — I739 Peripheral vascular disease, unspecified: Principal | ICD-10-CM

## 2015-10-20 DIAGNOSIS — Z5181 Encounter for therapeutic drug level monitoring: Secondary | ICD-10-CM

## 2015-10-20 NOTE — Telephone Encounter (Signed)
I spoke with patient We talked about cholesterol; she is worried about side effects of the cholesterol medicine; lower saturated fats; encouraged healthier eating; if she has plaque (carotids today), then I'll twist her arm and really press her to reconsider, b/c we'll want LDL under 70 She'll try to bring down uric acid on her own; discussed Uloric, but she'd like to try diet changes (low purine diet) and recheck uric acid in 3 months

## 2015-10-20 NOTE — Telephone Encounter (Signed)
Amy, Please call her GI doctor first -- see if any problem with me starting her on atorvastatin 10 mg and rechecking liver enzymes in 6 weeks THEN... Please let patient know that her carotid scan showed some blockages in both sides of the neck, but the good news is that both sides are LESS than 50% It takes years for these build-ups to happen, so less than 106% at 82 years of age is not bad However, we want to keep them from building more if we can help it We would usually put people on a statin like Lipitor or Crestor; we talked about medicine and she's not a big fan of medicine, but would she be willing to try a low dose instead of the usual higher dose? I would be willing to start with just 10 mg instead of 40 mg and then recheck her cholesterol in [redacted] weeks along with her liver enzymes and see how that does; I'm willing to use the lowest dose we can get away with; goal LDL under 70 Back to me if she opts for the cholesterol medicine

## 2015-10-21 NOTE — Telephone Encounter (Signed)
Left a detailed message for Dr. Percell Boston nurse. They will call me back.

## 2015-10-21 NOTE — Telephone Encounter (Signed)
Dr. Vira Agar said that would be fine to start.

## 2015-10-22 NOTE — Telephone Encounter (Signed)
Patient states she is willing to try the low dose of atorvastatin.

## 2015-10-23 MED ORDER — ATORVASTATIN CALCIUM 10 MG PO TABS: 10.0000 mg | ORAL_TABLET | Freq: Every day | ORAL | Status: DC

## 2015-10-23 NOTE — Assessment & Plan Note (Signed)
Start low dose statin; recheck fasting lipids and liver function panel in 6-8 weeks

## 2015-10-23 NOTE — Telephone Encounter (Signed)
Rx sent Lab orders entered Please let patient know to return for fasting labs on or right around March 14th If she gets a gout flare and has to use colchicine, STOP the cholesterol medicine for 4-5 days; they interact with each other

## 2015-10-23 NOTE — Telephone Encounter (Signed)
Patient notified

## 2015-10-23 NOTE — Assessment & Plan Note (Signed)
Check liver function tests in 6-8 weeks after initiation of statin

## 2015-10-28 ENCOUNTER — Other Ambulatory Visit: Payer: Self-pay

## 2015-10-28 NOTE — Telephone Encounter (Signed)
Pharmacy wants to know if you can change her Lipitor to a 90 day supply.

## 2015-10-29 NOTE — Telephone Encounter (Signed)
No, we're going to just do local refill for short time and recheck labs; 90 days may not be appropriate; if follow-up labs are normal, then I would be comfortable doing a 90 day supply; not right now

## 2015-10-29 NOTE — Telephone Encounter (Signed)
Pharmacy notified.

## 2015-10-30 ENCOUNTER — Ambulatory Visit: Payer: PPO | Admitting: Family Medicine

## 2015-11-04 DIAGNOSIS — L570 Actinic keratosis: Secondary | ICD-10-CM | POA: Diagnosis not present

## 2015-11-04 DIAGNOSIS — C44612 Basal cell carcinoma of skin of right upper limb, including shoulder: Secondary | ICD-10-CM | POA: Diagnosis not present

## 2015-11-05 ENCOUNTER — Encounter: Payer: Self-pay | Admitting: Family Medicine

## 2015-11-05 ENCOUNTER — Ambulatory Visit (INDEPENDENT_AMBULATORY_CARE_PROVIDER_SITE_OTHER): Payer: PPO | Admitting: Family Medicine

## 2015-11-05 VITALS — BP 158/82 | HR 86 | Temp 98.2°F | Wt 168.8 lb

## 2015-11-05 DIAGNOSIS — J208 Acute bronchitis due to other specified organisms: Secondary | ICD-10-CM | POA: Diagnosis not present

## 2015-11-05 DIAGNOSIS — E785 Hyperlipidemia, unspecified: Secondary | ICD-10-CM | POA: Diagnosis not present

## 2015-11-05 DIAGNOSIS — I10 Essential (primary) hypertension: Secondary | ICD-10-CM | POA: Diagnosis not present

## 2015-11-05 DIAGNOSIS — N183 Chronic kidney disease, stage 3 unspecified: Secondary | ICD-10-CM

## 2015-11-05 DIAGNOSIS — K743 Primary biliary cirrhosis: Secondary | ICD-10-CM

## 2015-11-05 DIAGNOSIS — E890 Postprocedural hypothyroidism: Secondary | ICD-10-CM | POA: Diagnosis not present

## 2015-11-05 MED ORDER — BENZONATATE 100 MG PO CAPS: 100.0000 mg | ORAL_CAPSULE | Freq: Three times a day (TID) | ORAL | Status: DC | PRN

## 2015-11-05 MED ORDER — AMLODIPINE BESYLATE 5 MG PO TABS: 7.5000 mg | ORAL_TABLET | Freq: Every day | ORAL | Status: DC

## 2015-11-05 MED ORDER — DOXYCYCLINE HYCLATE 100 MG PO TABS: 100.0000 mg | ORAL_TABLET | Freq: Two times a day (BID) | ORAL | Status: DC

## 2015-11-05 NOTE — Patient Instructions (Addendum)
Try to use PLAIN allergy medicine without the decongestant Avoid: phenylephrine, phenylpropanolamine, and pseudoephredine Okay to use Mucinex plain -- do NOT use Mucinex DM with the other cold medicine Okay to use Coricidin HBP with DM and chlorpheniramine Use the antibiotic ONLY if needed If you start the antibiotics... Please do eat yogurt daily or take a probiotic daily for the next month or two We want to replace the healthy germs in the gut If you notice foul, watery diarrhea in the next two months, schedule an appointment RIGHT AWAY  Feel free to call and speak with the provider on-call over the weekend or at night if needed Go to Urgent Care if you are getting much worse Try vitamin C (orange juice if not diabetic or vitamin C tablets) and drink green tea to help your immune system during your illness Get plenty of rest and hydration Increase the amlodipine from 5 mg daily to 7.5 mg daily (that's one and one-half pills daily)  Try to limit saturated fats in your diet (bologna, hot dogs, barbeque, cheeseburgers, hamburgers, steak, bacon, sausage, cheese, etc.) and get more fresh fruits, vegetables, and whole grains  DASH Eating Plan DASH stands for "Dietary Approaches to Stop Hypertension." The DASH eating plan is a healthy eating plan that has been shown to reduce high blood pressure (hypertension). Additional health benefits may include reducing the risk of type 2 diabetes mellitus, heart disease, and stroke. The DASH eating plan may also help with weight loss. WHAT DO I NEED TO KNOW ABOUT THE DASH EATING PLAN? For the DASH eating plan, you will follow these general guidelines:  Choose foods with a percent daily value for sodium of less than 5% (as listed on the food label).  Use salt-free seasonings or herbs instead of table salt or sea salt.  Check with your health care provider or pharmacist before using salt substitutes.  Eat lower-sodium products, often labeled as "lower  sodium" or "no salt added."  Eat fresh foods.  Eat more vegetables, fruits, and low-fat dairy products.  Choose whole grains. Look for the word "whole" as the first word in the ingredient list.  Choose fish and skinless chicken or [place] more often than red meat. Limit fish, poultry, and meat to 6 oz (170 g) each day.  Limit sweets, desserts, sugars, and sugary drinks.  Choose heart-healthy fats.  Limit cheese to 1 oz (28 g) per day.  Eat more home-cooked food and less restaurant, buffet, and fast food.  Limit fried foods.  Cook foods using methods other than frying.  Limit canned vegetables. If you do use them, rinse them well to decrease the sodium.  When eating at a restaurant, ask that your food be prepared with less salt, or no salt if possible. WHAT FOODS CAN I EAT? Seek help from a dietitian for individual calorie needs. Grains Whole grain or whole wheat bread. Brown rice. Whole grain or whole wheat pasta. Quinoa, bulgur, and whole grain cereals. Low-sodium cereals. Corn or whole wheat flour tortillas. Whole grain cornbread. Whole grain crackers. Low-sodium crackers. Vegetables Fresh or frozen vegetables (raw, steamed, roasted, or grilled). Low-sodium or reduced-sodium tomato and vegetable juices. Low-sodium or reduced-sodium tomato sauce and paste. Low-sodium or reduced-sodium canned vegetables.  Fruits All fresh, canned (in natural juice), or frozen fruits. Meat and Other Protein Products Ground beef (85% or leaner), grass-fed beef, or beef trimmed of fat. Skinless chicken or [place]. Ground chicken or [place]. Pork trimmed of fat. All fish and seafood. Eggs. Dried  beans, peas, or lentils. Unsalted nuts and seeds. Unsalted canned beans. Dairy Low-fat dairy products, such as skim or 1% milk, 2% or reduced-fat cheeses, low-fat ricotta or cottage cheese, or plain low-fat yogurt. Low-sodium or reduced-sodium cheeses. Fats and Oils Tub margarines without trans fats. Light or  reduced-fat mayonnaise and salad dressings (reduced sodium). Avocado. Safflower, olive, or canola oils. Natural peanut or almond butter. Other Unsalted popcorn and pretzels. The items listed above may not be a complete list of recommended foods or beverages. Contact your dietitian for more options. WHAT FOODS ARE NOT RECOMMENDED? Grains White bread. White pasta. White rice. Refined cornbread. Bagels and croissants. Crackers that contain trans fat. Vegetables Creamed or fried vegetables. Vegetables in a cheese sauce. Regular canned vegetables. Regular canned tomato sauce and paste. Regular tomato and vegetable juices. Fruits Dried fruits. Canned fruit in light or heavy syrup. Fruit juice. Meat and Other Protein Products Fatty cuts of meat. Ribs, chicken wings, bacon, sausage, bologna, salami, chitterlings, fatback, hot dogs, bratwurst, and packaged luncheon meats. Salted nuts and seeds. Canned beans with salt. Dairy Whole or 2% milk, cream, half-and-half, and cream cheese. Whole-fat or sweetened yogurt. Full-fat cheeses or blue cheese. Nondairy creamers and whipped toppings. Processed cheese, cheese spreads, or cheese curds. Condiments Onion and garlic salt, seasoned salt, table salt, and sea salt. Canned and packaged gravies. Worcestershire sauce. Tartar sauce. Barbecue sauce. Teriyaki sauce. Soy sauce, including reduced sodium. Steak sauce. Fish sauce. Oyster sauce. Cocktail sauce. Horseradish. Ketchup and mustard. Meat flavorings and tenderizers. Bouillon cubes. Hot sauce. Tabasco sauce. Marinades. Taco seasonings. Relishes. Fats and Oils Butter, stick margarine, lard, shortening, ghee, and bacon fat. Coconut, palm kernel, or palm oils. Regular salad dressings. Other Pickles and olives. Salted popcorn and pretzels. The items listed above may not be a complete list of foods and beverages to avoid. Contact your dietitian for more information. WHERE CAN I FIND MORE INFORMATION? National Heart,  Lung, and Blood Institute: travelstabloid.com   This information is not intended to replace advice given to you by your health care provider. Make sure you discuss any questions you have with your health care provider.   Document Released: 09/01/2011 Document Revised: 10/03/2014 Document Reviewed: 07/17/2013 Elsevier Interactive Patient Education [employer].

## 2015-11-05 NOTE — Progress Notes (Signed)
BP 158/82 mmHg  Pulse 86  Temp(Src) 98.2 F (36.8 C)  Wt 168 lb 12.8 oz (76.567 kg)  SpO2 96%   Subjective:    Patient ID: Caitlin Jenkins, female    DOB: 1931/11/23, 82 y.o.   MRN: [id number]  HPI: Caitlin Jenkins is a 82 y.o. female  Chief Complaint  Patient presents with  . Follow-up    follow up from near syncope episodes. She states she is doing better, no other episodes.  [name] URI    she's developed a scratchy throat last Friday, and now cough, chest congestion, some runny nose. wants to make sure she doesn't get bronchitis. She's been taking OTC meds and had an old partial bottle of Tussionex and finished it and would like a refill.  . Hyperlipidemia    She got the Atorvastatin rx in yesterday and she is really afriad to start it due to the side effects listed.   No more feelings of near syncope; completely resolved, feeling fine  Started with scratchy throat; coughing, having coughing spasms; taking coricidin HBP and plain mucinex-D She has hoarseness; no rash; no visits to hospitals and daycares; no travel No new ear problems No swollen glands  She got the prescription of the atorvastatin and read about things Having issues with high blood pressure; up and down; checking at home; 133/99, 142/117, 169/101 Her bottom number with her own machine today against ours was 92 versus 82 100/72 on Jan 25th with a pulse 73 She was on 10 mg am  Relevant past medical, surgical, family and social history reviewed and updated as indicated. Interim medical history since our last visit reviewed. She went to the dermatologist this week and they took off place and she'll go back in June; he did the one on her arm that was cancer, and he thought he got it all this time; she saw Dr. Lovell Sheehan (eyes) the last time; has a new dentist, Dr. Ruthy Dick  Allergies and medications reviewed and updated.  Review of Systems Per HPI unless specifically indicated above     Objective:    BP  158/82 mmHg  Pulse 86  Temp(Src) 98.2 F (36.8 C)  Wt 168 lb 12.8 oz (76.567 kg)  SpO2 96%  Wt Readings from Last 3 Encounters:  11/05/15 168 lb 12.8 oz (76.567 kg)  10/09/15 165 lb (74.844 kg)  06/19/15 164 lb (74.39 kg)    Physical Exam  Constitutional: She appears well-developed and well-nourished. No distress.  Eyes: EOM are normal. No scleral icterus.  Neck: No thyromegaly present.  Cardiovascular: Normal rate.   Pulmonary/Chest: Effort normal.  Abdominal: She exhibits no distension.  Skin: No pallor.  Psychiatric: She has a normal mood and affect. Her behavior is normal. Judgment and thought content normal.    Results for orders placed or performed in visit on 10/09/15  Lipid Panel w/o Chol/HDL Ratio  Result Value Ref Range   Cholesterol, Total 236 (H) 100 - 199 mg/dL   Triglycerides 215 (H) 0 - 149 mg/dL   HDL 50 >39 mg/dL   VLDL Cholesterol Cal 43 (H) 5 - 40 mg/dL   LDL Calculated 143 (H) 0 - 99 mg/dL  CBC with Differential/Platelet  Result Value Ref Range   WBC 9.2 3.4 - 10.8 x10E3/uL   RBC 4.47 3.77 - 5.28 x10E6/uL   Hemoglobin 12.6 11.1 - 15.9 g/dL   Hematocrit 37.1 34.0 - 46.6 %   MCV 83 79 - 97 fL   MCH 28.2  26.6 - 33.0 pg   MCHC 34.0 31.5 - 35.7 g/dL   RDW 16.5 (H) 12.3 - 15.4 %   Platelets 324 150 - 379 x10E3/uL   Neutrophils 66 %   Lymphs 24 %   Monocytes 7 %   Eos 3 %   Basos 0 %   Neutrophils Absolute 6.1 1.4 - 7.0 x10E3/uL   Lymphocytes Absolute 2.2 0.7 - 3.1 x10E3/uL   Monocytes Absolute 0.6 0.1 - 0.9 x10E3/uL   EOS (ABSOLUTE) 0.3 0.0 - 0.4 x10E3/uL   Basophils Absolute 0.0 0.0 - 0.2 x10E3/uL   Immature Granulocytes 0 %   Immature Grans (Abs) 0.0 0.0 - 0.1 x10E3/uL  TSH  Result Value Ref Range   TSH 0.950 0.450 - 4.500 uIU/mL  Comprehensive metabolic panel  Result Value Ref Range   Glucose 109 (H) 65 - 99 mg/dL   BUN 25 8 - 27 mg/dL   Creatinine, Ser 1.18 (H) 0.57 - 1.00 mg/dL   GFR calc non Af Amer 43 (L) >59 mL/min/1.73   GFR calc Af  Amer 50 (L) >59 mL/min/1.73   BUN/Creatinine Ratio 21 11 - 26   Sodium 138 134 - 144 mmol/L   Potassium 4.5 3.5 - 5.2 mmol/L   Chloride 99 96 - 106 mmol/L   CO2 22 18 - 29 mmol/L   Calcium 9.7 8.7 - 10.3 mg/dL   Total Protein 6.9 6.0 - 8.5 g/dL   Albumin 4.3 3.5 - 4.7 g/dL   Globulin, Total 2.6 1.5 - 4.5 g/dL   Albumin/Globulin Ratio 1.7 1.1 - 2.5   Bilirubin Total 0.3 0.0 - 1.2 mg/dL   Alkaline Phosphatase 111 39 - 117 IU/L   AST 24 0 - 40 IU/L   ALT 12 0 - 32 IU/L  Uric acid  Result Value Ref Range   Uric Acid 7.8 (H) 2.5 - 7.1 mg/dL  ANA w/Reflex if Positive  Result Value Ref Range   Anit Nuclear Antibody(ANA) Negative Negative      Assessment & Plan:   Problem List Items Addressed This Visit      Cardiovascular and Mediastinum   Hypertension    Avoid decongestants; not to goal; DASH guidelines; f/u in 3 weeks; see med list      Relevant Medications   amLODipine (NORVASC) 5 MG tablet     Digestive   Primary biliary cirrhosis ([place])    Managed by GI; reviewed recent labs with her        Endocrine   Hypothyroidism    Recent TSH 0.95; continue med at same dose        Genitourinary   CKD (chronic kidney disease) stage 3, GFR 30-59 ml/min    GFR 43, dose meds accordingly; avoid NSAIDs        Other   Hyperlipidemia    We talked about statin; healthy eating encouraged; GI had given okay for this; up to patient      Relevant Medications   amLODipine (NORVASC) 5 MG tablet    Other Visit Diagnoses    Acute bronchitis due to other specified organisms    -  Primary    rest, hydration, doxy, vit C, green tea; call if needed       Follow up plan: Return in about 3 weeks (around 11/26/2015) for hypertension.  Meds ordered this encounter  Medications  . dextromethorphan-guaiFENesin (MUCINEX DM) 30-600 MG 12hr tablet    Sig: Take 1 tablet by mouth 2 (two) times daily as needed for cough.  [name]  Chlorpheniramine-DM (CORICIDIN COUGH/COLD) 4-30 MG TABS    Sig: Take  by mouth daily as needed.  . Ascorbic Acid (VITAMIN C) 1000 MG tablet    Sig: Take 1,000 mg by mouth daily.  [name] amLODipine (NORVASC) 5 MG tablet    Sig: Take 1.5 tablets (7.5 mg total) by mouth daily.    Dispense:  135 tablet    Refill:  1    Pharmacy - I am changing her dose, please cancel old 10 mg refills  . doxycycline (VIBRA-TABS) 100 MG tablet    Sig: Take 1 tablet (100 mg total) by mouth 2 (two) times daily.    Dispense:  20 tablet    Refill:  0  . benzonatate (TESSALON PERLES) 100 MG capsule    Sig: Take 1 capsule (100 mg total) by mouth every 8 (eight) hours as needed for cough.    Dispense:  30 capsule    Refill:  0   An after-visit summary was printed and given to the patient at [facility].  Please see the patient instructions which may contain other information and recommendations beyond what is mentioned above in the assessment and plan.

## 2015-11-21 NOTE — Assessment & Plan Note (Signed)
Recent TSH 0.95; continue med at same dose

## 2015-11-21 NOTE — Assessment & Plan Note (Signed)
Avoid decongestants; not to goal; DASH guidelines; f/u in 3 weeks; see med list

## 2015-11-21 NOTE — Assessment & Plan Note (Signed)
GFR 43, dose meds accordingly; avoid NSAIDs

## 2015-11-21 NOTE — Assessment & Plan Note (Signed)
We talked about statin; healthy eating encouraged; GI had given okay for this; up to patient

## 2015-11-21 NOTE — Assessment & Plan Note (Signed)
Managed by GI; reviewed recent labs with her

## 2015-11-26 ENCOUNTER — Ambulatory Visit (INDEPENDENT_AMBULATORY_CARE_PROVIDER_SITE_OTHER): Payer: PPO | Admitting: Family Medicine

## 2015-11-26 ENCOUNTER — Encounter: Payer: Self-pay | Admitting: Family Medicine

## 2015-11-26 VITALS — BP 137/80 | HR 86 | Ht 63.35 in | Wt 166.0 lb

## 2015-11-26 DIAGNOSIS — E663 Overweight: Secondary | ICD-10-CM | POA: Diagnosis not present

## 2015-11-26 DIAGNOSIS — I10 Essential (primary) hypertension: Secondary | ICD-10-CM | POA: Diagnosis not present

## 2015-11-26 DIAGNOSIS — E785 Hyperlipidemia, unspecified: Secondary | ICD-10-CM | POA: Diagnosis not present

## 2015-11-26 MED ORDER — ATORVASTATIN CALCIUM 10 MG PO TABS: 10.0000 mg | ORAL_TABLET | Freq: Every day | ORAL | Status: DC

## 2015-11-26 NOTE — Progress Notes (Signed)
BP 137/80 mmHg  Pulse 86  Ht 5' 3.35" (1.609 m)  Wt 166 lb (75.297 kg)  BMI 29.08 kg/m2  SpO2 99%   Subjective:    Patient ID: Caitlin Jenkins, female    DOB: [birth year], 82 y.o.   MRN: [id number]  HPI: Caitlin Jenkins is a 82 y.o. female  Chief Complaint  Patient presents with  . Hypertension    follow up  . Medication Refill    she needs a refill on Atorvastatin   She is taking the atorvastatin; no problems, no abd pain, no aches, no jaundice She is doing so good with her diet, eating oatmeal; walking 1.5 miles every day She'll pick up the 2nd bottle of the cholesterol medicine soon  She is monitoring blood pressure and brought her readings with her for review; first day of checking was high, 173/102 on Feb 7th Pressures have come down gradually and nicely to 150s then 140s and now 130/77 at home yesterday No ankle swelling; constipation is not new; taking two colace every day Will start fiber  Overweight; we reviewed her BMI and got her height without shoes  Relevant past medical, surgical history reviewed Past Medical History  Diagnosis Date  . Biliary cirrhosis ([place])   . Hypertension   . Hyperlipidemia   . Hypothyroidism   . Gout   . Skin cancer of nose   . Cancer of skin of leg   . CKD (chronic kidney disease) stage 3, GFR 30-59 ml/min   . Hiatal hernia Oct 2015    8.5cm  . History of diverticulitis 2000   Past Surgical History  Procedure Laterality Date  . Abdominal hysterectomy  1995  . Hemorrhoid surgery    . Cataract extraction    . Foot surgery  2000  . Thyroid surgery  1999    para thyroid   Allergies and medications reviewed  Review of Systems Per HPI unless specifically indicated above     Objective:    BP 137/80 mmHg  Pulse 86  Ht 5' 3.35" (1.609 m)  Wt 166 lb (75.297 kg)  BMI 29.08 kg/m2  SpO2 99%  Wt Readings from Last 3 Encounters:  11/26/15 166 lb (75.297 kg)  11/05/15 168 lb 12.8 oz (76.567 kg)  10/09/15 165 lb (74.844  kg)    Physical Exam  Constitutional: She appears well-developed and well-nourished. No distress.  Weight down 2+ pounds since last visit 4 weeks ago  Eyes: No scleral icterus.  Cardiovascular: Normal rate and regular rhythm.   Pulmonary/Chest: Effort normal and breath sounds normal.  Abdominal: She exhibits no distension.  Musculoskeletal: She exhibits no edema.  Psychiatric: She has a normal mood and affect.    Results for orders placed or performed in visit on 10/09/15  Lipid Panel w/o Chol/HDL Ratio  Result Value Ref Range   Cholesterol, Total 236 (H) 100 - 199 mg/dL   Triglycerides 215 (H) 0 - 149 mg/dL   HDL 50 >39 mg/dL   VLDL Cholesterol Cal 43 (H) 5 - 40 mg/dL   LDL Calculated 143 (H) 0 - 99 mg/dL  CBC with Differential/Platelet  Result Value Ref Range   WBC 9.2 3.4 - 10.8 x10E3/uL   RBC 4.47 3.77 - 5.28 x10E6/uL   Hemoglobin 12.6 11.1 - 15.9 g/dL   Hematocrit 37.1 34.0 - 46.6 %   MCV 83 79 - 97 fL   MCH 28.2 26.6 - 33.0 pg   MCHC 34.0 31.5 - 35.7 g/dL   RDW  16.5 (H) 12.3 - 15.4 %   Platelets 324 150 - 379 x10E3/uL   Neutrophils 66 %   Lymphs 24 %   Monocytes 7 %   Eos 3 %   Basos 0 %   Neutrophils Absolute 6.1 1.4 - 7.0 x10E3/uL   Lymphocytes Absolute 2.2 0.7 - 3.1 x10E3/uL   Monocytes Absolute 0.6 0.1 - 0.9 x10E3/uL   EOS (ABSOLUTE) 0.3 0.0 - 0.4 x10E3/uL   Basophils Absolute 0.0 0.0 - 0.2 x10E3/uL   Immature Granulocytes 0 %   Immature Grans (Abs) 0.0 0.0 - 0.1 x10E3/uL  TSH  Result Value Ref Range   TSH 0.950 0.450 - 4.500 uIU/mL  Comprehensive metabolic panel  Result Value Ref Range   Glucose 109 (H) 65 - 99 mg/dL   BUN 25 8 - 27 mg/dL   Creatinine, Ser 1.18 (H) 0.57 - 1.00 mg/dL   GFR calc non Af Amer 43 (L) >59 mL/min/1.73   GFR calc Af Amer 50 (L) >59 mL/min/1.73   BUN/Creatinine Ratio 21 11 - 26   Sodium 138 134 - 144 mmol/L   Potassium 4.5 3.5 - 5.2 mmol/L   Chloride 99 96 - 106 mmol/L   CO2 22 18 - 29 mmol/L   Calcium 9.7 8.7 - 10.3 mg/dL    Total Protein 6.9 6.0 - 8.5 g/dL   Albumin 4.3 3.5 - 4.7 g/dL   Globulin, Total 2.6 1.5 - 4.5 g/dL   Albumin/Globulin Ratio 1.7 1.1 - 2.5   Bilirubin Total 0.3 0.0 - 1.2 mg/dL   Alkaline Phosphatase 111 39 - 117 IU/L   AST 24 0 - 40 IU/L   ALT 12 0 - 32 IU/L  Uric acid  Result Value Ref Range   Uric Acid 7.8 (H) 2.5 - 7.1 mg/dL  ANA w/Reflex if Positive  Result Value Ref Range   Anit Nuclear Antibody(ANA) Negative Negative      Assessment & Plan:   Problem List Items Addressed This Visit      Cardiovascular and Mediastinum   Essential hypertension, benign - Primary    Doing much better with dose adjustment, along with healthy eating, weight loss, activity; praise given for her healthy lifestyle      Relevant Medications   atorvastatin (LIPITOR) 10 MG tablet     Other   Hyperlipidemia    Tolerating statin; will see for close f/u in 4-6 weeks to recheck liver functions and lipid panel; praise given for activity, weight loss, healthy eating      Relevant Medications   atorvastatin (LIPITOR) 10 MG tablet   Overweight (BMI 25.0-29.9)    Praised her for walking, working on weight loss, and healthier eating         Follow up plan: Return 4-6 weeks, for thirty minute follow-up with fasting labs.  An after-visit summary was printed and given to the patient at [place].  Please see the patient instructions which may contain other information and recommendations beyond what is mentioned above in the assessment and plan.

## 2015-11-26 NOTE — Patient Instructions (Addendum)
I am so pleased with your blood pressure Continue the cholesterol medicine Recheck liver enzymes and fasting cholesterol in about 4-6 weeks; we'll include a uric acid level then too Keep up the great job you are doing with diet and weight loss Avoid chicken stock; use vegetable stock instead I'll be moving to another practice, [facility], on December 28, 2015 I'll be here until December 25, 2015

## 2015-11-29 DIAGNOSIS — E663 Overweight: Secondary | ICD-10-CM | POA: Insufficient documentation

## 2015-11-29 NOTE — Assessment & Plan Note (Signed)
Tolerating statin; will see for close f/u in 4-6 weeks to recheck liver functions and lipid panel; praise given for activity, weight loss, healthy eating

## 2015-11-29 NOTE — Assessment & Plan Note (Signed)
Praised her for walking, working on weight loss, and healthier eating

## 2015-11-29 NOTE — Assessment & Plan Note (Signed)
Doing much better with dose adjustment, along with healthy eating, weight loss, activity; praise given for her healthy lifestyle

## 2015-12-21 ENCOUNTER — Ambulatory Visit (INDEPENDENT_AMBULATORY_CARE_PROVIDER_SITE_OTHER): Payer: PPO | Admitting: Family Medicine

## 2015-12-21 ENCOUNTER — Encounter: Payer: Self-pay | Admitting: Family Medicine

## 2015-12-21 VITALS — BP 136/87 | HR 75 | Wt 164.0 lb

## 2015-12-21 DIAGNOSIS — E663 Overweight: Secondary | ICD-10-CM

## 2015-12-21 DIAGNOSIS — K743 Primary biliary cirrhosis: Secondary | ICD-10-CM | POA: Diagnosis not present

## 2015-12-21 DIAGNOSIS — I10 Essential (primary) hypertension: Secondary | ICD-10-CM

## 2015-12-21 DIAGNOSIS — E785 Hyperlipidemia, unspecified: Secondary | ICD-10-CM

## 2015-12-21 DIAGNOSIS — I739 Peripheral vascular disease, unspecified: Secondary | ICD-10-CM

## 2015-12-21 DIAGNOSIS — Z5181 Encounter for therapeutic drug level monitoring: Secondary | ICD-10-CM | POA: Diagnosis not present

## 2015-12-21 DIAGNOSIS — I779 Disorder of arteries and arterioles, unspecified: Secondary | ICD-10-CM

## 2015-12-21 NOTE — Assessment & Plan Note (Addendum)
Well-controlled today; reviewed her recent readings; limit sodium

## 2015-12-21 NOTE — Assessment & Plan Note (Signed)
Tolerating statin well; check liver function today

## 2015-12-21 NOTE — Patient Instructions (Addendum)
Keep up the great work Try to limit saturated fats in your diet (bologna, hot dogs, barbeque, cheeseburgers, hamburgers, steak, bacon, sausage, cheese, etc.) and get more fresh fruits, vegetables, and whole grains We'll contact you with the lab results If you have not heard anything from my staff in a week about any orders/referrals/studies from today, please contact us here to follow-up [phone] through March 31st; call me at the new office April 3rd or later at [phone] Next carotid scan will be due in January of 2018 Limit salt

## 2015-12-21 NOTE — Progress Notes (Signed)
BP 136/87 mmHg  Pulse 75  Wt 164 lb (74.39 kg)  SpO2 98%   Subjective:    Patient ID: Caitlin Jenkins, female    DOB: Apr 05, 1932, 82 y.o.   MRN: [id number]  HPI: Caitlin Jenkins is a 82 y.o. female  Chief Complaint  Patient presents with  . Hyperlipidemia    follow up, labs due  . Hypertension    follow up   Patient is here for f/u of hypertension She is checking her pressures at home, brought in readings; we reviewed them together; one of her readings was off, but she says her machine was misbehaving She does not re-salt anything; not much fast food She knows to watch sodium content in things Ankles swell sometimes in the evenings  Still going to the gym 6 days a week and is very active  Taking cholesterol medicine; no pain in the legs or arms; tolerating statin just fine; she has started eating oatmeal for breakfast; lots of fresh fruits and cottage cheese; limiting bacon and sausage; eating avocados and loves them; her for recheck of lipids and sgpt now that's been on the statin; has carotid artery atherosclerosis; we reviewed last scan from Jan 2017, less than 50% blockages  She sees Dr. Vira Agar for primary biliary cirrhosis; getting due for follow-up  Relevant past medical, surgical, family and social history reviewed and updated as indicated. Interim medical history since our last visit reviewed. Allergies and medications reviewed and updated.  Review of Systems Per HPI unless specifically indicated above     Objective:    BP 136/87 mmHg  Pulse 75  Wt 164 lb (74.39 kg)  SpO2 98%  Wt Readings from Last 3 Encounters:  12/21/15 164 lb (74.39 kg)  11/26/15 166 lb (75.297 kg)  11/05/15 168 lb 12.8 oz (76.567 kg)    Physical Exam  Constitutional: She appears well-developed and well-nourished.  HENT:  Mouth/Throat: Mucous membranes are normal.  Eyes: EOM are normal. No scleral icterus.  Cardiovascular: Normal rate and regular rhythm.   Pulmonary/Chest: Effort  normal and breath sounds normal.  Abdominal: Soft. She exhibits no distension.  Musculoskeletal: She exhibits no edema.  Neurological: She is alert.  Psychiatric: She has a normal mood and affect. Her behavior is normal.   Results for orders placed or performed in visit on 10/09/15  Lipid Panel w/o Chol/HDL Ratio  Result Value Ref Range   Cholesterol, Total 236 (H) 100 - 199 mg/dL   Triglycerides 215 (H) 0 - 149 mg/dL   HDL 50 >39 mg/dL   VLDL Cholesterol Cal 43 (H) 5 - 40 mg/dL   LDL Calculated 143 (H) 0 - 99 mg/dL  CBC with Differential/Platelet  Result Value Ref Range   WBC 9.2 3.4 - 10.8 x10E3/uL   RBC 4.47 3.77 - 5.28 x10E6/uL   Hemoglobin 12.6 11.1 - 15.9 g/dL   Hematocrit 37.1 34.0 - 46.6 %   MCV 83 79 - 97 fL   MCH 28.2 26.6 - 33.0 pg   MCHC 34.0 31.5 - 35.7 g/dL   RDW 16.5 (H) 12.3 - 15.4 %   Platelets 324 150 - 379 x10E3/uL   Neutrophils 66 %   Lymphs 24 %   Monocytes 7 %   Eos 3 %   Basos 0 %   Neutrophils Absolute 6.1 1.4 - 7.0 x10E3/uL   Lymphocytes Absolute 2.2 0.7 - 3.1 x10E3/uL   Monocytes Absolute 0.6 0.1 - 0.9 x10E3/uL   EOS (ABSOLUTE) 0.3 0.0 - 0.4  x10E3/uL   Basophils Absolute 0.0 0.0 - 0.2 x10E3/uL   Immature Granulocytes 0 %   Immature Grans (Abs) 0.0 0.0 - 0.1 x10E3/uL  TSH  Result Value Ref Range   TSH 0.950 0.450 - 4.500 uIU/mL  Comprehensive metabolic panel  Result Value Ref Range   Glucose 109 (H) 65 - 99 mg/dL   BUN 25 8 - 27 mg/dL   Creatinine, Ser 1.18 (H) 0.57 - 1.00 mg/dL   GFR calc non Af Amer 43 (L) >59 mL/min/1.73   GFR calc Af Amer 50 (L) >59 mL/min/1.73   BUN/Creatinine Ratio 21 11 - 26   Sodium 138 134 - 144 mmol/L   Potassium 4.5 3.5 - 5.2 mmol/L   Chloride 99 96 - 106 mmol/L   CO2 22 18 - 29 mmol/L   Calcium 9.7 8.7 - 10.3 mg/dL   Total Protein 6.9 6.0 - 8.5 g/dL   Albumin 4.3 3.5 - 4.7 g/dL   Globulin, Total 2.6 1.5 - 4.5 g/dL   Albumin/Globulin Ratio 1.7 1.1 - 2.5   Bilirubin Total 0.3 0.0 - 1.2 mg/dL   Alkaline  Phosphatase 111 39 - 117 IU/L   AST 24 0 - 40 IU/L   ALT 12 0 - 32 IU/L  Uric acid  Result Value Ref Range   Uric Acid 7.8 (H) 2.5 - 7.1 mg/dL  ANA w/Reflex if Positive  Result Value Ref Range   Anit Nuclear Antibody(ANA) Negative Negative      Assessment & Plan:   Problem List Items Addressed This Visit      Cardiovascular and Mediastinum   Essential hypertension, benign - Primary    Well-controlled today; reviewed her recent readings; limit sodium      Carotid artery disease ([facility])    Monitor lipids today; goal to get LDL under 70 ideally; next carotid scan Jan 2018        Digestive   Primary biliary cirrhosis ([place])    Tolerating statin well; check liver function today        Other   Hyperlipidemia    Praise given for her activity level, trying to eat right, weight loss; check lipids today on statin; goal LDL under 100 at least and under 70 ideally; decrease saturated fats      Medication monitoring encounter    Check liver function on statin      Overweight (BMI 25.0-29.9)    Encouragement given; so glad she feels well, able to exercise         Follow up plan: Return in about 6 months (around 06/22/2016) for fasting labs and visit with Dr. Sanda [facility] at [facility].  An after-visit summary was printed and given to the patient at [place].  Please see the patient instructions which may contain other information and recommendations beyond what is mentioned above in the assessment and plan.

## 2015-12-21 NOTE — Assessment & Plan Note (Addendum)
Praise given for her activity level, trying to eat right, weight loss; check lipids today on statin; goal LDL under 100 at least and under 70 ideally; decrease saturated fats

## 2015-12-21 NOTE — Assessment & Plan Note (Signed)
Check liver function on statin

## 2015-12-21 NOTE — Assessment & Plan Note (Signed)
Encouragement given; so glad she feels well, able to exercise

## 2015-12-21 NOTE — Assessment & Plan Note (Signed)
Monitor lipids today; goal to get LDL under 70 ideally; next carotid scan Jan 2018

## 2015-12-22 ENCOUNTER — Other Ambulatory Visit: Payer: Self-pay | Admitting: Family Medicine

## 2015-12-22 LAB — HEPATIC FUNCTION PANEL
ALT: 18 IU/L (ref 0–32)
AST: 31 IU/L (ref 0–40)
Albumin: 4.4 g/dL (ref 3.5–4.7)
Alkaline Phosphatase: 121 IU/L — ABNORMAL HIGH (ref 39–117)
BILIRUBIN TOTAL: 0.4 mg/dL (ref 0.0–1.2)
BILIRUBIN, DIRECT: 0.12 mg/dL (ref 0.00–0.40)
Total Protein: 7.3 g/dL (ref 6.0–8.5)

## 2015-12-22 LAB — LIPID PANEL W/O CHOL/HDL RATIO
CHOLESTEROL TOTAL: 142 mg/dL (ref 100–199)
HDL: 52 mg/dL (ref >39)
LDL Calculated: 63 mg/dL (ref 0–99)
Triglycerides: 135 mg/dL (ref 0–149)
VLDL Cholesterol Cal: 27 mg/dL (ref 5–40)

## 2015-12-22 MED ORDER — ATORVASTATIN CALCIUM 10 MG PO TABS: 10.0000 mg | ORAL_TABLET | Freq: Every day | ORAL | Status: DC

## 2016-01-26 ENCOUNTER — Other Ambulatory Visit: Payer: Self-pay

## 2016-01-26 MED ORDER — LEVOTHYROXINE SODIUM 112 MCG PO TABS: 112.0000 ug | ORAL_TABLET | Freq: Every day | ORAL | Status: DC

## 2016-01-26 NOTE — Telephone Encounter (Signed)
Normal TSH 10/09/15; Rx approved

## 2016-02-02 ENCOUNTER — Other Ambulatory Visit: Payer: Self-pay | Admitting: Nurse Practitioner

## 2016-02-02 DIAGNOSIS — K743 Primary biliary cirrhosis: Secondary | ICD-10-CM | POA: Diagnosis not present

## 2016-02-03 ENCOUNTER — Other Ambulatory Visit: Payer: Self-pay

## 2016-02-03 ENCOUNTER — Other Ambulatory Visit
Admission: RE | Admit: 2016-02-03 | Discharge: 2016-02-03 | Disposition: A | Discharge status: Home / Self Care | Payer: PPO | Source: Ambulatory Visit | Attending: *Deleted | Admitting: *Deleted

## 2016-02-03 ENCOUNTER — Telehealth: Payer: Self-pay

## 2016-02-03 ENCOUNTER — Ambulatory Visit
Admission: RE | Admit: 2016-02-03 | Discharge: 2016-02-03 | Disposition: A | Discharge status: Home / Self Care | Payer: PPO | Source: Ambulatory Visit | Attending: Family Medicine | Admitting: Family Medicine

## 2016-02-03 ENCOUNTER — Other Ambulatory Visit: Payer: Self-pay | Admitting: Family Medicine

## 2016-02-03 DIAGNOSIS — E878 Other disorders of electrolyte and fluid balance, not elsewhere classified: Secondary | ICD-10-CM | POA: Insufficient documentation

## 2016-02-03 DIAGNOSIS — R05 Cough: Secondary | ICD-10-CM | POA: Insufficient documentation

## 2016-02-03 DIAGNOSIS — R059 Cough, unspecified: Secondary | ICD-10-CM | POA: Insufficient documentation

## 2016-02-03 DIAGNOSIS — E871 Hypo-osmolality and hyponatremia: Secondary | ICD-10-CM | POA: Insufficient documentation

## 2016-02-03 DIAGNOSIS — K449 Diaphragmatic hernia without obstruction or gangrene: Secondary | ICD-10-CM | POA: Insufficient documentation

## 2016-02-03 LAB — CBC WITH DIFFERENTIAL/PLATELET
Basophils Absolute: 0 10*3/uL (ref 0–0.1)
EOS ABS: 0.2 10*3/uL (ref 0–0.7)
Eosinophils Relative: 2 %
HCT: 36.9 % (ref 35.0–47.0)
HEMOGLOBIN: 12.8 g/dL (ref 12.0–16.0)
LYMPHS ABS: 2.4 10*3/uL (ref 1.0–3.6)
Lymphocytes Relative: 23 %
MCH: 29.2 pg (ref 26.0–34.0)
MCHC: 34.7 g/dL (ref 32.0–36.0)
MCV: 84.2 fL (ref 80.0–100.0)
MONO ABS: 0.8 10*3/uL (ref 0.2–0.9)
Neutro Abs: 6.7 10*3/uL — ABNORMAL HIGH (ref 1.4–6.5)
Neutrophils Relative %: 66 %
Platelets: 361 10*3/uL (ref 150–440)
RBC: 4.38 MIL/uL (ref 3.80–5.20)
RDW: 14.5 % (ref 11.5–14.5)
WBC: 10.1 10*3/uL (ref 3.6–11.0)

## 2016-02-03 LAB — BASIC METABOLIC PANEL
Anion gap: 9 (ref 5–15)
BUN: 23 mg/dL — AB (ref 6–20)
CALCIUM: 9.5 mg/dL (ref 8.9–10.3)
CHLORIDE: 97 mmol/L — AB (ref 101–111)
CO2: 24 mmol/L (ref 22–32)
CREATININE: 1.34 mg/dL — AB (ref 0.44–1.00)
GFR calc Af Amer: 42 mL/min — ABNORMAL LOW (ref >60)
GFR calc non Af Amer: 36 mL/min — ABNORMAL LOW (ref >60)
GLUCOSE: 164 mg/dL — AB (ref 65–99)
Potassium: 5 mmol/L (ref 3.5–5.1)
Sodium: 130 mmol/L — ABNORMAL LOW (ref 135–145)

## 2016-02-03 NOTE — Telephone Encounter (Signed)
Pt was yesterday at [facility] they did blood work and wanted to let you know her sodium was low at 128

## 2016-02-03 NOTE — Telephone Encounter (Signed)
Pt.notified

## 2016-02-03 NOTE — Telephone Encounter (Signed)
Please have patient go for repeat lab today or tomorrow morning at the hospital to recheck this Find out if any fluid pills have been prescribed to her Also find out if she is drinking excessive liquids; if so, limit fluids to 1500 ml per day Let's recheck this before I order more tests

## 2016-02-04 ENCOUNTER — Telehealth: Payer: Self-pay | Admitting: Family Medicine

## 2016-02-04 DIAGNOSIS — N183 Chronic kidney disease, stage 3 unspecified: Secondary | ICD-10-CM

## 2016-02-04 DIAGNOSIS — E871 Hypo-osmolality and hyponatremia: Secondary | ICD-10-CM

## 2016-02-04 DIAGNOSIS — E878 Other disorders of electrolyte and fluid balance, not elsewhere classified: Secondary | ICD-10-CM

## 2016-02-04 MED ORDER — LOSARTAN POTASSIUM 100 MG PO TABS: 100.0000 mg | ORAL_TABLET | Freq: Every day | ORAL | Status: DC

## 2016-02-04 MED ORDER — ATORVASTATIN CALCIUM 10 MG PO TABS: 10.0000 mg | ORAL_TABLET | Freq: Every day | ORAL | Status: DC

## 2016-02-04 MED ORDER — BENZONATATE 100 MG PO CAPS: 100.0000 mg | ORAL_CAPSULE | Freq: Three times a day (TID) | ORAL | Status: DC | PRN

## 2016-02-04 NOTE — Telephone Encounter (Signed)
Labs were not sent to me through our system; reported to office manager I called patient Na+ better but still low, Cl- better but still low; GFR worse, Cr worse Will refer to nephrologist Discussed hiatal hernia, CXR results

## 2016-02-05 ENCOUNTER — Ambulatory Visit
Admission: RE | Admit: 2016-02-05 | Discharge: 2016-02-05 | Disposition: A | Discharge status: Home / Self Care | Payer: PPO | Source: Ambulatory Visit | Attending: Nurse Practitioner | Admitting: Nurse Practitioner

## 2016-02-05 ENCOUNTER — Encounter: Payer: Self-pay | Admitting: Family Medicine

## 2016-02-05 ENCOUNTER — Telehealth: Payer: Self-pay | Admitting: Family Medicine

## 2016-02-05 DIAGNOSIS — K743 Primary biliary cirrhosis: Secondary | ICD-10-CM | POA: Insufficient documentation

## 2016-02-05 DIAGNOSIS — K7460 Unspecified cirrhosis of liver: Secondary | ICD-10-CM | POA: Diagnosis not present

## 2016-02-05 NOTE — Telephone Encounter (Signed)
Pt states her Tessalon should be sent to [facility].

## 2016-02-08 NOTE — Telephone Encounter (Signed)
Miel, can you help with this? This was one of those cases where the labs were never sent to me. I had to go hunt them down. Can you get those to her and respond to her and help figure out what happened? Thank you

## 2016-03-02 DIAGNOSIS — D225 Melanocytic nevi of trunk: Secondary | ICD-10-CM | POA: Diagnosis not present

## 2016-03-02 DIAGNOSIS — D2262 Melanocytic nevi of left upper limb, including shoulder: Secondary | ICD-10-CM | POA: Diagnosis not present

## 2016-03-02 DIAGNOSIS — X32XXXA Exposure to sunlight, initial encounter: Secondary | ICD-10-CM | POA: Diagnosis not present

## 2016-03-02 DIAGNOSIS — D485 Neoplasm of uncertain behavior of skin: Secondary | ICD-10-CM | POA: Diagnosis not present

## 2016-03-02 DIAGNOSIS — D045 Carcinoma in situ of skin of trunk: Secondary | ICD-10-CM | POA: Diagnosis not present

## 2016-03-02 DIAGNOSIS — Z85828 Personal history of other malignant neoplasm of skin: Secondary | ICD-10-CM | POA: Diagnosis not present

## 2016-03-02 DIAGNOSIS — L570 Actinic keratosis: Secondary | ICD-10-CM | POA: Diagnosis not present

## 2016-03-02 DIAGNOSIS — D2271 Melanocytic nevi of right lower limb, including hip: Secondary | ICD-10-CM | POA: Diagnosis not present

## 2016-03-07 DIAGNOSIS — E871 Hypo-osmolality and hyponatremia: Secondary | ICD-10-CM | POA: Diagnosis not present

## 2016-03-07 DIAGNOSIS — I129 Hypertensive chronic kidney disease with stage 1 through stage 4 chronic kidney disease, or unspecified chronic kidney disease: Secondary | ICD-10-CM | POA: Diagnosis not present

## 2016-03-07 DIAGNOSIS — E785 Hyperlipidemia, unspecified: Secondary | ICD-10-CM | POA: Diagnosis not present

## 2016-03-07 DIAGNOSIS — E781 Pure hyperglyceridemia: Secondary | ICD-10-CM | POA: Diagnosis not present

## 2016-03-07 DIAGNOSIS — N183 Chronic kidney disease, stage 3 (moderate): Secondary | ICD-10-CM | POA: Diagnosis not present

## 2016-03-08 DIAGNOSIS — N183 Chronic kidney disease, stage 3 (moderate): Secondary | ICD-10-CM | POA: Diagnosis not present

## 2016-03-21 ENCOUNTER — Other Ambulatory Visit: Payer: Self-pay | Admitting: Family Medicine

## 2016-03-21 ENCOUNTER — Encounter: Payer: Self-pay | Admitting: Family Medicine

## 2016-03-21 MED ORDER — BENZONATATE 100 MG PO CAPS: 100.0000 mg | ORAL_CAPSULE | Freq: Three times a day (TID) | ORAL | Status: DC | PRN

## 2016-03-21 NOTE — Telephone Encounter (Signed)
I sent another Rx; it has been sent, then faxed and I'm sending again "normal", not print

## 2016-04-12 DIAGNOSIS — D045 Carcinoma in situ of skin of trunk: Secondary | ICD-10-CM | POA: Diagnosis not present

## 2016-04-15 DIAGNOSIS — N2581 Secondary hyperparathyroidism of renal origin: Secondary | ICD-10-CM | POA: Diagnosis not present

## 2016-04-15 DIAGNOSIS — M109 Gout, unspecified: Secondary | ICD-10-CM | POA: Diagnosis not present

## 2016-04-15 DIAGNOSIS — N183 Chronic kidney disease, stage 3 (moderate): Secondary | ICD-10-CM | POA: Diagnosis not present

## 2016-04-15 DIAGNOSIS — I129 Hypertensive chronic kidney disease with stage 1 through stage 4 chronic kidney disease, or unspecified chronic kidney disease: Secondary | ICD-10-CM | POA: Diagnosis not present

## 2016-05-24 ENCOUNTER — Encounter: Payer: Self-pay | Admitting: Family Medicine

## 2016-05-24 ENCOUNTER — Other Ambulatory Visit: Payer: Self-pay | Admitting: Family Medicine

## 2016-05-24 ENCOUNTER — Ambulatory Visit (INDEPENDENT_AMBULATORY_CARE_PROVIDER_SITE_OTHER): Payer: PPO | Admitting: Family Medicine

## 2016-05-24 VITALS — BP 128/84 | HR 81 | Temp 97.9°F | Wt 164.0 lb

## 2016-05-24 DIAGNOSIS — I779 Disorder of arteries and arterioles, unspecified: Secondary | ICD-10-CM

## 2016-05-24 DIAGNOSIS — E663 Overweight: Secondary | ICD-10-CM | POA: Diagnosis not present

## 2016-05-24 DIAGNOSIS — E785 Hyperlipidemia, unspecified: Secondary | ICD-10-CM | POA: Diagnosis not present

## 2016-05-24 DIAGNOSIS — N183 Chronic kidney disease, stage 3 unspecified: Secondary | ICD-10-CM

## 2016-05-24 DIAGNOSIS — Z5181 Encounter for therapeutic drug level monitoring: Secondary | ICD-10-CM

## 2016-05-24 DIAGNOSIS — E890 Postprocedural hypothyroidism: Secondary | ICD-10-CM

## 2016-05-24 DIAGNOSIS — I10 Essential (primary) hypertension: Secondary | ICD-10-CM

## 2016-05-24 DIAGNOSIS — M2550 Pain in unspecified joint: Secondary | ICD-10-CM | POA: Diagnosis not present

## 2016-05-24 DIAGNOSIS — I739 Peripheral vascular disease, unspecified: Secondary | ICD-10-CM

## 2016-05-24 DIAGNOSIS — M1A0721 Idiopathic chronic gout, left ankle and foot, with tophus (tophi): Secondary | ICD-10-CM

## 2016-05-24 DIAGNOSIS — K743 Primary biliary cirrhosis: Secondary | ICD-10-CM | POA: Diagnosis not present

## 2016-05-24 DIAGNOSIS — Z23 Encounter for immunization: Secondary | ICD-10-CM

## 2016-05-24 MED ORDER — FLUTICASONE PROPIONATE 50 MCG/ACT NA SUSP: 2.0000 | Freq: Every day | NASAL | 6 refills | Status: DC

## 2016-05-24 NOTE — Patient Instructions (Addendum)
Stop loratidine Start fluticasone for sinus drainage and allergies We'll get labs today Try to limit saturated fats in your diet (bologna, hot dogs, barbeque, cheeseburgers, hamburgers, steak, bacon, sausage, cheese, etc.) and get more fresh fruits, vegetables, and whole grains   Gout Gout is an inflammatory arthritis caused by a buildup of uric acid crystals in the joints. Uric acid is a chemical that is normally present in the blood. When the level of uric acid in the blood is too high it can form crystals that deposit in your joints and tissues. This causes joint redness, soreness, and swelling (inflammation). Repeat attacks are common. Over time, uric acid crystals can form into masses (tophi) near a joint, destroying bone and causing disfigurement. Gout is treatable and often preventable. CAUSES  The disease begins with elevated levels of uric acid in the blood. Uric acid is produced by your body when it breaks down a naturally found substance called purines. Certain foods you eat, such as meats and fish, contain high amounts of purines. Causes of an elevated uric acid level include:  Being passed down from parent to child (heredity).  Diseases that cause increased uric acid production (such as obesity, psoriasis, and certain cancers).  Excessive alcohol use.  Diet, especially diets rich in meat and seafood.  Medicines, including certain cancer-fighting medicines (chemotherapy), water pills (diuretics), and aspirin.  Chronic kidney disease. The kidneys are no longer able to remove uric acid well.  Problems with metabolism. Conditions strongly associated with gout include:  Obesity.  High blood pressure.  High cholesterol.  Diabetes. Not everyone with elevated uric acid levels gets gout. It is not understood why some people get gout and others do not. Surgery, joint injury, and eating too much of certain foods are some of the factors that can lead to gout attacks. SYMPTOMS    An attack of gout comes on quickly. It causes intense pain with redness, swelling, and warmth in a joint.  Fever can occur.  Often, only one joint is involved. Certain joints are more commonly involved:  Base of the big toe.  Knee.  Ankle.  Wrist.  Finger. Without treatment, an attack usually goes away in a few days to weeks. Between attacks, you usually will not have symptoms, which is different from many other forms of arthritis. DIAGNOSIS  Your caregiver will suspect gout based on your symptoms and exam. In some cases, tests may be recommended. The tests may include:  Blood tests.  Urine tests.  X-rays.  Joint fluid exam. This exam requires a needle to remove fluid from the joint (arthrocentesis). Using a microscope, gout is confirmed when uric acid crystals are seen in the joint fluid. TREATMENT  There are two phases to gout treatment: treating the sudden onset (acute) attack and preventing attacks (prophylaxis).  Treatment of an Acute Attack.  Medicines are used. These include anti-inflammatory medicines or steroid medicines.  An injection of steroid medicine into the affected joint is sometimes necessary.  The painful joint is rested. Movement can worsen the arthritis.  You may use warm or cold treatments on painful joints, depending which works best for you.  Treatment to Prevent Attacks.  If you suffer from frequent gout attacks, your caregiver may advise preventive medicine. These medicines are started after the acute attack subsides. These medicines either help your kidneys eliminate uric acid from your body or decrease your uric acid production. You may need to stay on these medicines for a very long time.  The early phase  of treatment with preventive medicine can be associated with an increase in acute gout attacks. For this reason, during the first few months of treatment, your caregiver may also advise you to take medicines usually used for acute gout  treatment. Be sure you understand your caregiver's directions. Your caregiver may make several adjustments to your medicine dose before these medicines are effective.  Discuss dietary treatment with your caregiver or dietitian. Alcohol and drinks high in sugar and fructose and foods such as meat, poultry, and seafood can increase uric acid levels. Your caregiver or dietitian can advise you on drinks and foods that should be limited. HOME CARE INSTRUCTIONS   Do not take aspirin to relieve pain. This raises uric acid levels.  Only take over-the-counter or prescription medicines for pain, discomfort, or fever as directed by your caregiver.  Rest the joint as much as possible. When in bed, keep sheets and blankets off painful areas.  Keep the affected joint raised (elevated).  Apply warm or cold treatments to painful joints. Use of warm or cold treatments depends on which works best for you.  Use crutches if the painful joint is in your leg.  Drink enough fluids to keep your urine clear or pale yellow. This helps your body get rid of uric acid. Limit alcohol, sugary drinks, and fructose drinks.  Follow your dietary instructions. Pay careful attention to the amount of protein you eat. Your daily diet should emphasize fruits, vegetables, whole grains, and fat-free or low-fat milk products. Discuss the use of coffee, vitamin C, and cherries with your caregiver or dietitian. These may be helpful in lowering uric acid levels.  Maintain a healthy body weight. SEEK MEDICAL CARE IF:   You develop diarrhea, vomiting, or any side effects from medicines.  You do not feel better in 24 hours, or you are getting worse. SEEK IMMEDIATE MEDICAL CARE IF:   Your joint becomes suddenly more tender, and you have chills or a fever. MAKE SURE YOU:   Understand these instructions.  Will watch your condition.  Will get help right away if you are not doing well or get worse.   This information is not  intended to replace advice given to you by your health care provider. Make sure you discuss any questions you have with your health care provider.   Document Released: 09/09/2000 Document Revised: 10/03/2014 Document Reviewed: 04/25/2012 Elsevier Interactive Patient Education 2016 [place] DASH stands for "Dietary Approaches to Stop Hypertension." The DASH eating plan is a healthy eating plan that has been shown to reduce high blood pressure (hypertension). Additional health benefits may include reducing the risk of type 2 diabetes mellitus, heart disease, and stroke. The DASH eating plan may also help with weight loss. WHAT DO I NEED TO KNOW ABOUT THE DASH EATING PLAN? For the DASH eating plan, you will follow these general guidelines:  Choose foods with a percent daily value for sodium of less than 5% (as listed on the food label).  Use salt-free seasonings or herbs instead of table salt or sea salt.  Check with your health care provider or pharmacist before using salt substitutes.  Eat lower-sodium products, often labeled as "lower sodium" or "no salt added."  Eat fresh foods.  Eat more vegetables, fruits, and low-fat dairy products.  Choose whole grains. Look for the word "whole" as the first word in the ingredient list.  Choose fish and skinless chicken or [place] more often than red meat. Limit fish, poultry, and  meat to 6 oz (170 g) each day.  Limit sweets, desserts, sugars, and sugary drinks.  Choose heart-healthy fats.  Limit cheese to 1 oz (28 g) per day.  Eat more home-cooked food and less restaurant, buffet, and fast food.  Limit fried foods.  Cook foods using methods other than frying.  Limit canned vegetables. If you do use them, rinse them well to decrease the sodium.  When eating at a restaurant, ask that your food be prepared with less salt, or no salt if possible. WHAT FOODS CAN I EAT? Seek help from a dietitian for individual calorie  needs. Grains Whole grain or whole wheat bread. Brown rice. Whole grain or whole wheat pasta. Quinoa, bulgur, and whole grain cereals. Low-sodium cereals. Corn or whole wheat flour tortillas. Whole grain cornbread. Whole grain crackers. Low-sodium crackers. Vegetables Fresh or frozen vegetables (raw, steamed, roasted, or grilled). Low-sodium or reduced-sodium tomato and vegetable juices. Low-sodium or reduced-sodium tomato sauce and paste. Low-sodium or reduced-sodium canned vegetables.  Fruits All fresh, canned (in natural juice), or frozen fruits. Meat and Other Protein Products Ground beef (85% or leaner), grass-fed beef, or beef trimmed of fat. Skinless chicken or [place]. Ground chicken or [place]. Pork trimmed of fat. All fish and seafood. Eggs. Dried beans, peas, or lentils. Unsalted nuts and seeds. Unsalted canned beans. Dairy Low-fat dairy products, such as skim or 1% milk, 2% or reduced-fat cheeses, low-fat ricotta or cottage cheese, or plain low-fat yogurt. Low-sodium or reduced-sodium cheeses. Fats and Oils Tub margarines without trans fats. Light or reduced-fat mayonnaise and salad dressings (reduced sodium). Avocado. Safflower, olive, or canola oils. Natural peanut or almond butter. Other Unsalted popcorn and pretzels. The items listed above may not be a complete list of recommended foods or beverages. Contact your dietitian for more options. WHAT FOODS ARE NOT RECOMMENDED? Grains White bread. White pasta. White rice. Refined cornbread. Bagels and croissants. Crackers that contain trans fat. Vegetables Creamed or fried vegetables. Vegetables in a cheese sauce. Regular canned vegetables. Regular canned tomato sauce and paste. Regular tomato and vegetable juices. Fruits Dried fruits. Canned fruit in light or heavy syrup. Fruit juice. Meat and Other Protein Products Fatty cuts of meat. Ribs, chicken wings, bacon, sausage, bologna, salami, chitterlings, fatback, hot dogs, bratwurst,  and packaged luncheon meats. Salted nuts and seeds. Canned beans with salt. Dairy Whole or 2% milk, cream, half-and-half, and cream cheese. Whole-fat or sweetened yogurt. Full-fat cheeses or blue cheese. Nondairy creamers and whipped toppings. Processed cheese, cheese spreads, or cheese curds. Condiments Onion and garlic salt, seasoned salt, table salt, and sea salt. Canned and packaged gravies. Worcestershire sauce. Tartar sauce. Barbecue sauce. Teriyaki sauce. Soy sauce, including reduced sodium. Steak sauce. Fish sauce. Oyster sauce. Cocktail sauce. Horseradish. Ketchup and mustard. Meat flavorings and tenderizers. Bouillon cubes. Hot sauce. Tabasco sauce. Marinades. Taco seasonings. Relishes. Fats and Oils Butter, stick margarine, lard, shortening, ghee, and bacon fat. Coconut, palm kernel, or palm oils. Regular salad dressings. Other Pickles and olives. Salted popcorn and pretzels. The items listed above may not be a complete list of foods and beverages to avoid. Contact your dietitian for more information. WHERE CAN I FIND MORE INFORMATION? National Heart, Lung, and Blood Institute: travelstabloid.com   This information is not intended to replace advice given to you by your health care provider. Make sure you discuss any questions you have with your health care provider.   Document Released: 09/01/2011 Document Revised: 10/03/2014 Document Reviewed: 07/17/2013 Elsevier Interactive Patient Education [employer].

## 2016-05-24 NOTE — Assessment & Plan Note (Signed)
Tylenol; offered referral to rheum if worse

## 2016-05-24 NOTE — Assessment & Plan Note (Addendum)
Avoid chicken soup, liver, [place], gravies; do not take statin if gout flare and using colchicine

## 2016-05-24 NOTE — Assessment & Plan Note (Signed)
Check TSH today

## 2016-05-24 NOTE — Progress Notes (Signed)
BP 128/84   Pulse 81   Temp 97.9 F (36.6 C) (Oral)   Wt 164 lb (74.4 kg)   SpO2 95%   BMI 28.73 kg/m    Subjective:    Patient ID: Caitlin Jenkins, female    DOB: [birth year], 82 y.o.   MRN: [id number]  HPI: Caitlin Jenkins is a 82 y.o. female  Chief Complaint  Patient presents with  . Follow-up   She is here for follow-up  She saw Dr. Juleen China about two months ago and he reduced the amlodipine; she had been swelling in the ankles but none now; no excessive salt but not avoiding completely; not using artificial salts  She has not been back to see GI recently; had the [place] and saw him after that;   She was taking acetaminophen for arthritis, now taking one a day; that worked for about two weeks, then cut it in half again and then had a bad flare of gout or arthritis; had pain in her knees, couldn't hardly walk; took three acetaminophen that day; taking 650 mg tylenol twice a day, will try to cut it down to 325 mg twice a day; no belly pain or jaundice on the tylenol  Instead of fish oil, she is using omega red  She is using loratidine; not taking take care of this drainage; never used a nasal spray  Thyroid is okay; energy is fair; no weight gain; stools are regular  High cholesterol; on statin; does eat [employer] sausage biscuit 1-2x a week; does eat cheese; no more than 3 eggs a week  Depression screen [facility] 2/9 05/24/2016 03/13/2015  Decreased Interest 0 0  Down, Depressed, Hopeless 0 0  PHQ - 2 Score 0 0   Relevant past medical, surgical, family and social history reviewed Past Medical History:  Diagnosis Date  . Biliary cirrhosis ([place])   . Cancer of skin of leg   . CKD (chronic kidney disease) stage 3, GFR 30-59 ml/min   . Gout   . Hiatal hernia Oct 2015   8.5cm  . History of diverticulitis 2000  . Hyperlipidemia   . Hypertension   . Hypothyroidism   . Skin cancer of nose    Past Surgical History:  Procedure Laterality Date  . ABDOMINAL HYSTERECTOMY   1995  . CATARACT EXTRACTION    . FOOT SURGERY  2000  . HEMORRHOID SURGERY    . THYROID SURGERY  1999   para thyroid   Family History  Problem Relation Age of Onset  . Emphysema Father   . Asthma Father   . Hypertension Daughter   . Hypertension Son   . Cancer Son     prostate   Social History  Substance Use Topics  . Smoking status: Never Smoker  . Smokeless tobacco: Never Used  . Alcohol use No    Interim medical history since last visit reviewed. Allergies and medications reviewed  Review of Systems Per HPI unless specifically indicated above     Objective:    BP 128/84   Pulse 81   Temp 97.9 F (36.6 C) (Oral)   Wt 164 lb (74.4 kg)   SpO2 95%   BMI 28.73 kg/m   Wt Readings from Last 3 Encounters:  05/24/16 164 lb (74.4 kg)  12/21/15 164 lb (74.4 kg)  11/26/15 166 lb (75.3 kg)    Physical Exam  Constitutional: She appears well-developed and well-nourished.  HENT:  Mouth/Throat: Mucous membranes are normal.  Eyes: EOM are normal.  No scleral icterus.  Cardiovascular: Normal rate and regular rhythm.   Pulmonary/Chest: Effort normal and breath sounds normal.  Abdominal: Soft. She exhibits no distension. There is no tenderness.  Musculoskeletal: She exhibits no edema.  Few heberdens/bouchards nodes; no MCP deviation or swelling  Neurological: She is alert.  Skin: Skin is warm and dry. No pallor.  Psychiatric: She has a normal mood and affect. Her behavior is normal.   Results for orders placed or performed during the hospital encounter of 0000000  Basic metabolic panel  Result Value Ref Range   Sodium 130 (L) 135 - 145 mmol/L   Potassium 5.0 3.5 - 5.1 mmol/L   Chloride 97 (L) 101 - 111 mmol/L   CO2 24 22 - 32 mmol/L   Glucose, Bld 164 (H) 65 - 99 mg/dL   BUN 23 (H) 6 - 20 mg/dL   Creatinine, Ser 1.34 (H) 0.44 - 1.00 mg/dL   Calcium 9.5 8.9 - 10.3 mg/dL   GFR calc non Af Amer 36 (L) >60 mL/min   GFR calc Af Amer 42 (L) >60 mL/min   Anion gap 9 5 -  15  CBC with Differential/Platelet  Result Value Ref Range   WBC 10.1 3.6 - 11.0 K/uL   RBC 4.38 3.80 - 5.20 MIL/uL   Hemoglobin 12.8 12.0 - 16.0 g/dL   HCT 36.9 35.0 - 47.0 %   MCV 84.2 80.0 - 100.0 fL   MCH 29.2 26.0 - 34.0 pg   MCHC 34.7 32.0 - 36.0 g/dL   RDW 14.5 11.5 - 14.5 %   Platelets 361 150 - 440 K/uL   Neutrophils Relative % 66% %   Neutro Abs 6.7 (H) 1.4 - 6.5 K/uL   Lymphocytes Relative 23% %   Lymphs Abs 2.4 1.0 - 3.6 K/uL   Monocytes Relative 8% %   Monocytes Absolute 0.8 0.2 - 0.9 K/uL   Eosinophils Relative 2% %   Eosinophils Absolute 0.2 0 - 0.7 K/uL   Basophils Relative 1% %   Basophils Absolute 0.0 0 - 0.1 K/uL      Assessment & Plan:   Problem List Items Addressed This Visit      Cardiovascular and Mediastinum   Essential hypertension, benign    Well-controlled; try DASH guidelines       Relevant Medications   amLODipine (NORVASC) 5 MG tablet   Carotid artery disease (HCC)    Check lipids today; next scan in Jan 2018; try limit saturated fats; aspirin daily      Relevant Medications   amLODipine (NORVASC) 5 MG tablet   Other Relevant Orders   Lipid panel     Digestive   Primary biliary cirrhosis (HCC)    Check LFTs and f/u with GI as directed      Relevant Orders   COMPLETE METABOLIC PANEL WITH GFR     Endocrine   Hypothyroidism    Check TSH today      Relevant Orders   TSH     Genitourinary   CKD (chronic kidney disease) stage 3, GFR 30-59 ml/min    Monitored by Dr. Juleen China; avoid NSAIDs        Other   Overweight (BMI 25.0-29.9)    stable      Hyperlipidemia - Primary    Check lipids      Relevant Medications   amLODipine (NORVASC) 5 MG tablet   Other Relevant Orders   Lipid panel   Gout    Avoid chicken soup, liver, [place],  gravies; do not take statin if gout flare and using colchicine      Relevant Orders   Uric acid   Encounter for medication monitoring    Check labs today      Relevant Orders   CBC  with Differential/Platelet   Arthralgia of multiple joints    Tylenol; offered referral to rheum if worse       Other Visit Diagnoses    Needs flu shot       Relevant Orders   Flu vaccine HIGH DOSE PF (Fluzone High dose) (Completed)       Follow up plan: Return in about 6 months (around 11/23/2016) for follow-up; sooner if needed.  An after-visit summary was printed and given to the patient at [facility].  Please see the patient instructions which may contain other information and recommendations beyond what is mentioned above in the assessment and plan.  Meds ordered this encounter  Medications  . meclizine (ANTIVERT) 25 MG tablet    Sig: Take by mouth.  . quiNINE (QUALAQUIN) 324 MG capsule    Sig: Take by mouth.  . fluticasone (FLONASE) 50 MCG/ACT nasal spray    Sig: Place 2 sprays into both nostrils daily.    Dispense:  16 g    Refill:  6  . amLODipine (NORVASC) 5 MG tablet    Sig: Take 5 mg by mouth daily.    Orders Placed This Encounter  Procedures  . Flu vaccine HIGH DOSE PF (Fluzone High dose)  . TSH  . CBC with Differential/Platelet  . Lipid panel  . Uric acid  . COMPLETE METABOLIC PANEL WITH GFR

## 2016-05-24 NOTE — Assessment & Plan Note (Signed)
Check lipids today; next scan in Jan 2018; try limit saturated fats; aspirin daily

## 2016-05-24 NOTE — Assessment & Plan Note (Signed)
Monitored by Dr. Juleen China; avoid NSAIDs

## 2016-05-24 NOTE — Assessment & Plan Note (Signed)
Well-controlled; try DASH guidelines 

## 2016-05-24 NOTE — Assessment & Plan Note (Signed)
Check labs today.

## 2016-05-24 NOTE — Assessment & Plan Note (Signed)
stable °

## 2016-05-24 NOTE — Assessment & Plan Note (Signed)
Check lipids 

## 2016-05-24 NOTE — Assessment & Plan Note (Signed)
Check LFTs and f/u with GI as directed

## 2016-05-25 LAB — CBC WITH DIFFERENTIAL/PLATELET
BASOS: 0 %
Basophils Absolute: 0 10*3/uL (ref 0.0–0.2)
EOS (ABSOLUTE): 0.3 10*3/uL (ref 0.0–0.4)
EOS: 3 %
HEMATOCRIT: 38.9 % (ref 34.0–46.6)
HEMOGLOBIN: 13.2 g/dL (ref 11.1–15.9)
IMMATURE GRANULOCYTES: 0 %
Immature Grans (Abs): 0 10*3/uL (ref 0.0–0.1)
LYMPHS ABS: 2.2 10*3/uL (ref 0.7–3.1)
Lymphs: 24 %
MCH: 28.3 pg (ref 26.6–33.0)
MCHC: 33.9 g/dL (ref 31.5–35.7)
MCV: 83 fL (ref 79–97)
MONOCYTES: 8 %
Monocytes Absolute: 0.7 10*3/uL (ref 0.1–0.9)
NEUTROS PCT: 65 %
Neutrophils Absolute: 6 10*3/uL (ref 1.4–7.0)
Platelets: 345 10*3/uL (ref 150–379)
RBC: 4.67 x10E6/uL (ref 3.77–5.28)
RDW: 14.1 % (ref 12.3–15.4)
WBC: 9.2 10*3/uL (ref 3.4–10.8)

## 2016-05-25 LAB — COMPREHENSIVE METABOLIC PANEL
A/G RATIO: 1.3 (ref 1.2–2.2)
ALBUMIN: 4.3 g/dL (ref 3.5–4.7)
ALT: 14 IU/L (ref 0–32)
AST: 26 IU/L (ref 0–40)
Alkaline Phosphatase: 129 IU/L — ABNORMAL HIGH (ref 39–117)
BUN / CREAT RATIO: 19 (ref 12–28)
BUN: 24 mg/dL (ref 8–27)
Bilirubin Total: 0.4 mg/dL (ref 0.0–1.2)
CALCIUM: 9.8 mg/dL (ref 8.7–10.3)
CO2: 21 mmol/L (ref 18–29)
CREATININE: 1.26 mg/dL — AB (ref 0.57–1.00)
Chloride: 97 mmol/L (ref 96–106)
GFR, EST AFRICAN AMERICAN: 46 mL/min/{1.73_m2} — AB (ref >59)
GFR, EST NON AFRICAN AMERICAN: 40 mL/min/{1.73_m2} — AB (ref >59)
GLOBULIN, TOTAL: 3.2 g/dL (ref 1.5–4.5)
Glucose: 95 mg/dL (ref 65–99)
Potassium: 5.2 mmol/L (ref 3.5–5.2)
SODIUM: 135 mmol/L (ref 134–144)
Total Protein: 7.5 g/dL (ref 6.0–8.5)

## 2016-05-25 LAB — LIPID PANEL W/O CHOL/HDL RATIO
Cholesterol, Total: 146 mg/dL (ref 100–199)
HDL: 43 mg/dL (ref >39)
LDL CALC: 73 mg/dL (ref 0–99)
Triglycerides: 152 mg/dL — ABNORMAL HIGH (ref 0–149)
VLDL Cholesterol Cal: 30 mg/dL (ref 5–40)

## 2016-05-25 LAB — TSH: TSH: 0.421 u[IU]/mL — AB (ref 0.450–4.500)

## 2016-05-25 LAB — URIC ACID: Uric Acid: 8.4 mg/dL — ABNORMAL HIGH (ref 2.5–7.1)

## 2016-06-02 ENCOUNTER — Other Ambulatory Visit: Payer: Self-pay | Admitting: Family Medicine

## 2016-06-02 DIAGNOSIS — E890 Postprocedural hypothyroidism: Secondary | ICD-10-CM

## 2016-06-02 MED ORDER — LEVOTHYROXINE SODIUM 100 MCG PO TABS: 100.0000 ug | ORAL_TABLET | Freq: Every day | ORAL | 1 refills | Status: DC

## 2016-06-02 NOTE — Progress Notes (Signed)
Lowering dose of thyroid med; recheck TSH in 6-8 weeks

## 2016-08-01 DIAGNOSIS — N2581 Secondary hyperparathyroidism of renal origin: Secondary | ICD-10-CM | POA: Diagnosis not present

## 2016-08-01 DIAGNOSIS — M109 Gout, unspecified: Secondary | ICD-10-CM | POA: Diagnosis not present

## 2016-08-01 DIAGNOSIS — N183 Chronic kidney disease, stage 3 (moderate): Secondary | ICD-10-CM | POA: Diagnosis not present

## 2016-08-01 DIAGNOSIS — I129 Hypertensive chronic kidney disease with stage 1 through stage 4 chronic kidney disease, or unspecified chronic kidney disease: Secondary | ICD-10-CM | POA: Diagnosis not present

## 2016-08-04 ENCOUNTER — Encounter: Payer: Self-pay | Admitting: Family Medicine

## 2016-08-08 MED ORDER — FLUTICASONE PROPIONATE 50 MCG/ACT NA SUSP: 2.0000 | Freq: Every day | NASAL | 3 refills | Status: DC

## 2016-08-08 MED ORDER — AMLODIPINE BESYLATE 5 MG PO TABS: 5.0000 mg | ORAL_TABLET | Freq: Every day | ORAL | 3 refills | Status: DC

## 2016-08-08 MED ORDER — ATORVASTATIN CALCIUM 10 MG PO TABS: 10.0000 mg | ORAL_TABLET | Freq: Every day | ORAL | 1 refills | Status: DC

## 2016-08-08 MED ORDER — LOSARTAN POTASSIUM 100 MG PO TABS: 100.0000 mg | ORAL_TABLET | Freq: Every day | ORAL | 1 refills | Status: DC

## 2016-08-08 MED ORDER — LEVOTHYROXINE SODIUM 100 MCG PO TABS: 100.0000 ug | ORAL_TABLET | Freq: Every day | ORAL | 1 refills | Status: DC

## 2016-08-11 ENCOUNTER — Telehealth: Payer: Self-pay | Admitting: Family Medicine

## 2016-08-11 ENCOUNTER — Encounter: Payer: Self-pay | Admitting: Family Medicine

## 2016-08-11 ENCOUNTER — Other Ambulatory Visit: Payer: Self-pay

## 2016-08-11 DIAGNOSIS — K743 Primary biliary cirrhosis: Secondary | ICD-10-CM | POA: Diagnosis not present

## 2016-08-11 DIAGNOSIS — E890 Postprocedural hypothyroidism: Secondary | ICD-10-CM

## 2016-08-11 MED ORDER — LORATADINE 10 MG PO TABS: 10.0000 mg | ORAL_TABLET | Freq: Every day | ORAL | 3 refills | Status: DC

## 2016-08-11 NOTE — Telephone Encounter (Signed)
done

## 2016-08-11 NOTE — Telephone Encounter (Signed)
Pt forgot that she also needs her Loratadine 10mg  refilled. She forgot when she told you of her others. Pharm is [facility] order.

## 2016-09-05 DIAGNOSIS — Z961 Presence of intraocular lens: Secondary | ICD-10-CM | POA: Diagnosis not present

## 2016-09-12 DIAGNOSIS — D225 Melanocytic nevi of trunk: Secondary | ICD-10-CM | POA: Diagnosis not present

## 2016-09-12 DIAGNOSIS — Z85828 Personal history of other malignant neoplasm of skin: Secondary | ICD-10-CM | POA: Diagnosis not present

## 2016-09-12 DIAGNOSIS — D224 Melanocytic nevi of scalp and neck: Secondary | ICD-10-CM | POA: Diagnosis not present

## 2016-09-12 DIAGNOSIS — D2261 Melanocytic nevi of right upper limb, including shoulder: Secondary | ICD-10-CM | POA: Diagnosis not present

## 2016-10-04 ENCOUNTER — Encounter: Payer: Self-pay | Admitting: Family Medicine

## 2016-10-04 ENCOUNTER — Ambulatory Visit (INDEPENDENT_AMBULATORY_CARE_PROVIDER_SITE_OTHER): Payer: PPO | Admitting: Family Medicine

## 2016-10-04 DIAGNOSIS — E890 Postprocedural hypothyroidism: Secondary | ICD-10-CM

## 2016-10-04 DIAGNOSIS — E782 Mixed hyperlipidemia: Secondary | ICD-10-CM

## 2016-10-04 DIAGNOSIS — N183 Chronic kidney disease, stage 3 unspecified: Secondary | ICD-10-CM

## 2016-10-04 DIAGNOSIS — I779 Disorder of arteries and arterioles, unspecified: Secondary | ICD-10-CM

## 2016-10-04 DIAGNOSIS — I739 Peripheral vascular disease, unspecified: Secondary | ICD-10-CM

## 2016-10-04 DIAGNOSIS — K743 Primary biliary cirrhosis: Secondary | ICD-10-CM | POA: Diagnosis not present

## 2016-10-04 DIAGNOSIS — M1A0721 Idiopathic chronic gout, left ankle and foot, with tophus (tophi): Secondary | ICD-10-CM | POA: Diagnosis not present

## 2016-10-04 LAB — TSH: TSH: 0.44 m[IU]/L

## 2016-10-04 NOTE — Assessment & Plan Note (Signed)
Managed by nephrologist; avoid NSAID

## 2016-10-04 NOTE — Assessment & Plan Note (Signed)
Much improved with diet and medicine; check lipids

## 2016-10-04 NOTE — Assessment & Plan Note (Signed)
Check TSH and adjust medicine if needed 

## 2016-10-04 NOTE — Assessment & Plan Note (Addendum)
Check lipids; order carotid US for monitoring; continue daily aspirin, try to keep LDL down under 100 at least, under 70 would be ideal

## 2016-10-04 NOTE — Patient Instructions (Addendum)
Avoid chicken stock and beef stock Avoid gravies Let's get labs If you have not heard anything from my staff in a week about any orders/referrals/studies from today, please contact us here to follow-up [phone] Monitor your blood pressure and call me if dizzy or light-headed or pressures going low If you do have a day with low pressure and feeling light-headed, do not take your amlodipine and call us

## 2016-10-04 NOTE — Assessment & Plan Note (Signed)
Check uric acid. 

## 2016-10-04 NOTE — Assessment & Plan Note (Signed)
Managed by GI; check liver enzymes and send to them

## 2016-10-04 NOTE — Progress Notes (Signed)
BP 116/74   Pulse 97   Temp 98.1 F (36.7 C) (Oral)   Resp 14   Wt 165 lb (74.8 kg)   SpO2 97%   BMI 28.91 kg/m    Subjective:    Patient ID: Caitlin Jenkins, female    DOB: [birth year], 83 y.o.   MRN: [id number]  HPI: Caitlin Jenkins is a 83 y.o. female  Chief Complaint  Patient presents with  . Follow-up   She is here for follow-up; doing well overall, nothing major since last visit  She has had BP issues for years, on medicine, feeling fine; controlled today; does not feel dizzy, though today's reading a little lower for her than normal  Hypothyroidism; s/p parathyroidectomy, since then needed replacement; energy level is not too good, she's lazy, just too cold to do a lot; been reading some good books, like to read, relax, no worries  Primary biliary cirrhosis followed by GI; no RUQ pain other than along ribs along both sides, going on for many years  High cholesterol; taking omega red instead of fish oil; last lipids reviewed;   Spell of gout or something middle finger on the left hand, right index and right ring; sore and stiff; fever and swollen in the fingers; juice from aloe plants  Depression screen [facility] 2/9 10/04/2016 05/24/2016 03/13/2015  Decreased Interest 0 0 0  Down, Depressed, Hopeless 0 0 0  PHQ - 2 Score 0 0 0   Relevant past medical, surgical, family and social history reviewed Past Medical History:  Diagnosis Date  . Biliary cirrhosis ([place])   . Cancer of skin of leg   . CKD (chronic kidney disease) stage 3, GFR 30-59 ml/min   . Gout   . Hiatal hernia Oct 2015   8.5cm  . History of diverticulitis 2000  . Hyperlipidemia   . Hypertension   . Hypothyroidism   . Skin cancer of nose    Past Surgical History:  Procedure Laterality Date  . ABDOMINAL HYSTERECTOMY  1995  . CATARACT EXTRACTION    . FOOT SURGERY  2000  . HEMORRHOID SURGERY    . THYROID SURGERY  1999   para thyroid   Family History  Problem Relation Age of Onset  . Emphysema  Father   . Asthma Father   . Hypertension Daughter   . Hypertension Son   . Cancer Son     prostate   Social History  Substance Use Topics  . Smoking status: Never Smoker  . Smokeless tobacco: Never Used  . Alcohol use No    Interim medical history since last visit reviewed. Allergies and medications reviewed  Review of Systems Per HPI unless specifically indicated above     Objective:    BP 116/74   Pulse 97   Temp 98.1 F (36.7 C) (Oral)   Resp 14   Wt 165 lb (74.8 kg)   SpO2 97%   BMI 28.91 kg/m   Wt Readings from Last 3 Encounters:  10/04/16 165 lb (74.8 kg)  05/24/16 164 lb (74.4 kg)  12/21/15 164 lb (74.4 kg)    Physical Exam  Constitutional: She appears well-developed and well-nourished.  HENT:  Mouth/Throat: Mucous membranes are normal.  Eyes: EOM are normal. No scleral icterus.  Cardiovascular: Normal rate and regular rhythm.   Pulmonary/Chest: Effort normal and breath sounds normal.  Abdominal: Soft. She exhibits no distension. There is no tenderness.  Musculoskeletal: She exhibits no edema.  Few heberdens/bouchards nodes; no MCP deviation  or swelling  Neurological: She is alert.  Skin: Skin is warm and dry. No pallor.  Psychiatric: She has a normal mood and affect. Her behavior is normal.      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[place])    Managed by GI; check liver enzymes and send to them      Relevant Orders   COMPLETE METABOLIC PANEL WITH GFR (Completed)     Endocrine   Hypothyroidism    Check TSH and adjust medicine if needed      Relevant Orders   TSH (Completed)      Genitourinary   CKD (chronic kidney disease) stage 3, GFR 30-59 ml/min    Managed by nephrologist; avoid NSAID        Other   Hyperlipidemia    Much improved with diet and medicine; check lipids      Relevant Orders   Lipid panel (Completed)   Gout    Check uric acid      Relevant Orders   Uric acid (Completed)      Follow up plan: Return in about 6 months (around 04/03/2017) for follow-up and fasting labs.  An after-visit summary was printed and given to the patient at [place].  Please see the patient instructions which may contain other information and recommendations beyond what is mentioned above in the assessment and plan.  Meds ordered this encounter  Medications  . magnesium 30 MG tablet    Sig: Take 30 mg by mouth 2 (two) times daily.    Orders Placed This Encounter  Procedures  . US Carotid Bilateral  . Lipid panel  . COMPLETE METABOLIC PANEL WITH GFR  . TSH  . Uric acid

## 2016-10-05 LAB — LIPID PANEL
CHOL/HDL RATIO: 3.9 ratio (ref <5.0)
CHOLESTEROL: 152 mg/dL (ref <200)
HDL: 39 mg/dL — AB (ref >50)
LDL Cholesterol: 61 mg/dL (ref <100)
Triglycerides: 261 mg/dL — ABNORMAL HIGH (ref <150)
VLDL: 52 mg/dL — ABNORMAL HIGH (ref <30)

## 2016-10-05 LAB — COMPLETE METABOLIC PANEL WITH GFR
ALBUMIN: 4.1 g/dL (ref 3.6–5.1)
ALK PHOS: 100 U/L (ref 33–130)
ALT: 19 U/L (ref 6–29)
AST: 34 U/L (ref 10–35)
BUN: 18 mg/dL (ref 7–25)
CALCIUM: 9.6 mg/dL (ref 8.6–10.4)
CO2: 22 mmol/L (ref 20–31)
Chloride: 103 mmol/L (ref 98–110)
Creat: 1.26 mg/dL — ABNORMAL HIGH (ref 0.60–0.88)
GFR, Est African American: 46 mL/min — ABNORMAL LOW (ref >=60)
GFR, Est Non African American: 39 mL/min — ABNORMAL LOW (ref >=60)
Glucose, Bld: 96 mg/dL (ref 65–99)
POTASSIUM: 5 mmol/L (ref 3.5–5.3)
Sodium: 140 mmol/L (ref 135–146)
Total Bilirubin: 0.5 mg/dL (ref 0.2–1.2)
Total Protein: 7.4 g/dL (ref 6.1–8.1)

## 2016-10-05 LAB — URIC ACID: Uric Acid, Serum: 7.8 mg/dL — ABNORMAL HIGH (ref 2.5–7.0)

## 2016-10-10 ENCOUNTER — Ambulatory Visit
Admission: RE | Admit: 2016-10-10 | Discharge: 2016-10-10 | Disposition: A | Discharge status: Home / Self Care | Payer: PPO | Source: Ambulatory Visit | Attending: Family Medicine | Admitting: Family Medicine

## 2016-10-10 DIAGNOSIS — I6523 Occlusion and stenosis of bilateral carotid arteries: Secondary | ICD-10-CM | POA: Insufficient documentation

## 2016-11-02 DIAGNOSIS — C44219 Basal cell carcinoma of skin of left ear and external auricular canal: Secondary | ICD-10-CM | POA: Diagnosis not present

## 2016-11-02 DIAGNOSIS — L608 Other nail disorders: Secondary | ICD-10-CM | POA: Diagnosis not present

## 2016-11-02 DIAGNOSIS — D485 Neoplasm of uncertain behavior of skin: Secondary | ICD-10-CM | POA: Diagnosis not present

## 2016-12-13 ENCOUNTER — Encounter: Payer: Self-pay | Admitting: Family Medicine

## 2016-12-13 DIAGNOSIS — R899 Unspecified abnormal finding in specimens from other organs, systems and tissues: Secondary | ICD-10-CM | POA: Diagnosis not present

## 2016-12-13 DIAGNOSIS — K743 Primary biliary cirrhosis: Secondary | ICD-10-CM | POA: Diagnosis not present

## 2016-12-13 MED ORDER — ATORVASTATIN CALCIUM 10 MG PO TABS: 10.0000 mg | ORAL_TABLET | Freq: Every day | ORAL | 2 refills | Status: DC

## 2016-12-13 MED ORDER — LEVOTHYROXINE SODIUM 100 MCG PO TABS: 100.0000 ug | ORAL_TABLET | Freq: Every day | ORAL | 2 refills | Status: DC

## 2016-12-13 MED ORDER — LOSARTAN POTASSIUM 100 MG PO TABS: 100.0000 mg | ORAL_TABLET | Freq: Every day | ORAL | 2 refills | Status: DC

## 2016-12-13 NOTE — Telephone Encounter (Signed)
Last lipids, sgpt, TSH, Cr, and K+ reviewed Rxs approved

## 2016-12-14 ENCOUNTER — Encounter: Payer: Self-pay | Admitting: Family Medicine

## 2016-12-14 ENCOUNTER — Ambulatory Visit (INDEPENDENT_AMBULATORY_CARE_PROVIDER_SITE_OTHER): Payer: PPO | Admitting: Family Medicine

## 2016-12-14 VITALS — BP 138/82 | HR 87 | Temp 98.4°F | Resp 16 | Wt 167.4 lb

## 2016-12-14 DIAGNOSIS — R252 Cramp and spasm: Secondary | ICD-10-CM | POA: Diagnosis not present

## 2016-12-14 DIAGNOSIS — M1A0610 Idiopathic chronic gout, right knee, without tophus (tophi): Secondary | ICD-10-CM | POA: Diagnosis not present

## 2016-12-14 DIAGNOSIS — H6123 Impacted cerumen, bilateral: Secondary | ICD-10-CM

## 2016-12-14 MED ORDER — BENZONATATE 100 MG PO CAPS: 100.0000 mg | ORAL_CAPSULE | ORAL | 3 refills | Status: DC | PRN

## 2016-12-14 MED ORDER — MECLIZINE HCL 25 MG PO TABS: 25.0000 mg | ORAL_TABLET | Freq: Three times a day (TID) | ORAL | 3 refills | Status: DC | PRN

## 2016-12-14 MED ORDER — COLCHICINE 0.6 MG PO TABS: ORAL_TABLET | ORAL | 3 refills | Status: DC

## 2016-12-14 NOTE — Progress Notes (Signed)
BP 138/82   Pulse 87   Temp 98.4 F (36.9 C) (Oral)   Resp 16   Wt 167 lb 6.4 oz (75.9 kg)   SpO2 95%   BMI 29.33 kg/m    Subjective:    Patient ID: Caitlin Jenkins, female    DOB: 11-27-1931, 83 y.o.   MRN: [id number]  HPI: Caitlin Jenkins is a 83 y.o. female  Chief Complaint  Patient presents with  . Cerumen Impaction  . Gout    left knee   She had her hearing aides checked yesterday and they said she had wax and needs to be cleaned out  She says last night, she was reading and sitting in her chair; felt like she might have twisted her knee a little bit; biofreeze on the left knee; overnight, it turned into full blown gout  She would also like a refill of the medicine she uses for occasional cough; no weight loss, no night sweats  Occasionally gets muscle cramps, just wants to know what to take  Depression screen [facility] 2/9 12/14/2016 10/04/2016 05/24/2016 03/13/2015  Decreased Interest 0 0 0 0  Down, Depressed, Hopeless 0 0 0 0  PHQ - 2 Score 0 0 0 0   Relevant past medical, surgical, family and social history reviewed Past Medical History:  Diagnosis Date  . Biliary cirrhosis ([place])   . Cancer of skin of leg   . CKD (chronic kidney disease) stage 3, GFR 30-59 ml/min   . Gout   . Hiatal hernia Oct 2015   8.5cm  . History of diverticulitis 2000  . Hyperlipidemia   . Hypertension   . Hypothyroidism   . Skin cancer of nose    Family History  Problem Relation Age of Onset  . Emphysema Father   . Asthma Father   . Hypertension Daughter   . Hypertension Son   . Cancer Son     prostate   Social History  Substance Use Topics  . Smoking status: Never Smoker  . Smokeless tobacco: Never Used  . Alcohol use No   Interim medical history since last visit reviewed. Allergies and medications reviewed  Review of Systems Per HPI unless specifically indicated above     Objective:    BP 138/82   Pulse 87   Temp 98.4 F (36.9 C) (Oral)   Resp 16   Wt 167 lb  6.4 oz (75.9 kg)   SpO2 95%   BMI 29.33 kg/m   Wt Readings from Last 3 Encounters:  12/14/16 167 lb 6.4 oz (75.9 kg)  10/04/16 165 lb (74.8 kg)  05/24/16 164 lb (74.4 kg)    Physical Exam  Constitutional: She appears well-developed and well-nourished.  HENT:  Right Ear: Decreased hearing is noted.  Left Ear: Decreased hearing is noted.  Mouth/Throat: Oropharynx is clear and moist.  Both ears with cerumen impaction; removed with irriigation, tolerated well  Cardiovascular: Normal rate.   Pulmonary/Chest: Effort normal.  Musculoskeletal:       Left knee: She exhibits swelling. She exhibits no erythema.  Lymphadenopathy:    She has no cervical adenopathy.      Assessment & Plan:   Problem List Items Addressed This Visit      Other   Gout    Colchicine for flares; avoidance of purine-rich foods       Other Visit Diagnoses    Bilateral impacted cerumen    -  Primary   irrigated, removed, tolerated well  Relevant Orders   Ear Lavage   Muscle cramps       may try magnesium lower dose or tonic water; if persistent, let me know and we'll get labs       Follow up plan: No Follow-up on file.  An after-visit summary was printed and given to the patient at [place].  Please see the patient instructions which may contain other information and recommendations beyond what is mentioned above in the assessment and plan.  Meds ordered this encounter  Medications  . DISCONTD: benzonatate (TESSALON) 100 MG capsule    Sig: Take 100 mg by mouth as needed for cough.  . colchicine 0.6 MG tablet    Sig: Take two pills at onset of gout flare, followed by one more one hour later, max of three pills per flare.    Dispense:  15 tablet    Refill:  3  . meclizine (ANTIVERT) 25 MG tablet    Sig: Take 1 tablet (25 mg total) by mouth 3 (three) times daily as needed.    Dispense:  30 tablet    Refill:  3  . benzonatate (TESSALON) 100 MG capsule    Sig: Take 1 capsule (100 mg total) by  mouth as needed for cough.    Dispense:  30 capsule    Refill:  3    Orders Placed This Encounter  Procedures  . Ear Lavage

## 2016-12-14 NOTE — Patient Instructions (Signed)
If you do take the colchicine, don't take your cholesterol pill for 3 days Try magnesium oxide 250 mg daily if needed for cramps You could also try tonic water, just 2-4 ounces once a day, for cramps

## 2016-12-27 DIAGNOSIS — C44311 Basal cell carcinoma of skin of nose: Secondary | ICD-10-CM | POA: Diagnosis not present

## 2017-01-02 NOTE — Assessment & Plan Note (Signed)
Colchicine for flares; avoidance of purine-rich foods

## 2017-01-30 DIAGNOSIS — R809 Proteinuria, unspecified: Secondary | ICD-10-CM | POA: Diagnosis not present

## 2017-01-30 DIAGNOSIS — I129 Hypertensive chronic kidney disease with stage 1 through stage 4 chronic kidney disease, or unspecified chronic kidney disease: Secondary | ICD-10-CM | POA: Diagnosis not present

## 2017-01-30 DIAGNOSIS — N183 Chronic kidney disease, stage 3 (moderate): Secondary | ICD-10-CM | POA: Diagnosis not present

## 2017-01-30 DIAGNOSIS — M109 Gout, unspecified: Secondary | ICD-10-CM | POA: Diagnosis not present

## 2017-03-20 ENCOUNTER — Other Ambulatory Visit: Payer: Self-pay | Admitting: Nurse Practitioner

## 2017-03-20 DIAGNOSIS — K743 Primary biliary cirrhosis: Secondary | ICD-10-CM | POA: Diagnosis not present

## 2017-03-23 ENCOUNTER — Ambulatory Visit
Admission: RE | Admit: 2017-03-23 | Discharge: 2017-03-23 | Disposition: A | Discharge status: Home / Self Care | Payer: PPO | Source: Ambulatory Visit | Attending: Nurse Practitioner | Admitting: Nurse Practitioner

## 2017-03-23 DIAGNOSIS — K769 Liver disease, unspecified: Secondary | ICD-10-CM | POA: Diagnosis not present

## 2017-03-23 DIAGNOSIS — K760 Fatty (change of) liver, not elsewhere classified: Secondary | ICD-10-CM | POA: Diagnosis not present

## 2017-03-23 DIAGNOSIS — K743 Primary biliary cirrhosis: Secondary | ICD-10-CM | POA: Diagnosis not present

## 2017-03-23 DIAGNOSIS — N289 Disorder of kidney and ureter, unspecified: Secondary | ICD-10-CM | POA: Insufficient documentation

## 2017-03-24 ENCOUNTER — Encounter: Payer: Self-pay | Admitting: Family Medicine

## 2017-03-24 DIAGNOSIS — M1A0610 Idiopathic chronic gout, right knee, without tophus (tophi): Secondary | ICD-10-CM

## 2017-03-24 DIAGNOSIS — E782 Mixed hyperlipidemia: Secondary | ICD-10-CM

## 2017-03-24 DIAGNOSIS — I779 Disorder of arteries and arterioles, unspecified: Secondary | ICD-10-CM

## 2017-03-24 DIAGNOSIS — E890 Postprocedural hypothyroidism: Secondary | ICD-10-CM

## 2017-03-24 DIAGNOSIS — I739 Peripheral vascular disease, unspecified: Principal | ICD-10-CM

## 2017-03-24 DIAGNOSIS — Z5181 Encounter for therapeutic drug level monitoring: Secondary | ICD-10-CM

## 2017-03-30 NOTE — Addendum Note (Signed)
Addended by: LADA, Satira Anis on: 03/30/2017 05:27 PM   Modules accepted: Orders

## 2017-03-31 DIAGNOSIS — E782 Mixed hyperlipidemia: Secondary | ICD-10-CM | POA: Diagnosis not present

## 2017-03-31 DIAGNOSIS — M1A0610 Idiopathic chronic gout, right knee, without tophus (tophi): Secondary | ICD-10-CM | POA: Diagnosis not present

## 2017-03-31 DIAGNOSIS — Z5181 Encounter for therapeutic drug level monitoring: Secondary | ICD-10-CM | POA: Diagnosis not present

## 2017-03-31 DIAGNOSIS — E890 Postprocedural hypothyroidism: Secondary | ICD-10-CM | POA: Diagnosis not present

## 2017-04-01 LAB — BASIC METABOLIC PANEL
BUN / CREAT RATIO: 18 (ref 12–28)
BUN: 22 mg/dL (ref 8–27)
CO2: 23 mmol/L (ref 20–29)
CREATININE: 1.25 mg/dL — AB (ref 0.57–1.00)
Calcium: 10 mg/dL (ref 8.7–10.3)
Chloride: 94 mmol/L — ABNORMAL LOW (ref 96–106)
GFR calc Af Amer: 46 mL/min/{1.73_m2} — ABNORMAL LOW (ref >59)
GFR, EST NON AFRICAN AMERICAN: 40 mL/min/{1.73_m2} — AB (ref >59)
Glucose: 100 mg/dL — ABNORMAL HIGH (ref 65–99)
Potassium: 5 mmol/L (ref 3.5–5.2)
SODIUM: 131 mmol/L — AB (ref 134–144)

## 2017-04-01 LAB — LIPID PANEL
CHOL/HDL RATIO: 2.8 ratio (ref 0.0–4.4)
Cholesterol, Total: 149 mg/dL (ref 100–199)
HDL: 54 mg/dL (ref >39)
LDL CALC: 75 mg/dL (ref 0–99)
Triglycerides: 102 mg/dL (ref 0–149)
VLDL Cholesterol Cal: 20 mg/dL (ref 5–40)

## 2017-04-01 LAB — URIC ACID: Uric Acid: 6.6 mg/dL (ref 2.5–7.1)

## 2017-04-01 LAB — TSH: TSH: 2.96 u[IU]/mL (ref 0.450–4.500)

## 2017-04-04 ENCOUNTER — Ambulatory Visit (INDEPENDENT_AMBULATORY_CARE_PROVIDER_SITE_OTHER): Payer: PPO | Admitting: Family Medicine

## 2017-04-04 ENCOUNTER — Encounter: Payer: Self-pay | Admitting: Family Medicine

## 2017-04-04 DIAGNOSIS — I10 Essential (primary) hypertension: Secondary | ICD-10-CM | POA: Diagnosis not present

## 2017-04-04 DIAGNOSIS — N183 Chronic kidney disease, stage 3 unspecified: Secondary | ICD-10-CM

## 2017-04-04 DIAGNOSIS — L609 Nail disorder, unspecified: Secondary | ICD-10-CM | POA: Diagnosis not present

## 2017-04-04 DIAGNOSIS — K743 Primary biliary cirrhosis: Secondary | ICD-10-CM

## 2017-04-04 DIAGNOSIS — M2550 Pain in unspecified joint: Secondary | ICD-10-CM

## 2017-04-04 DIAGNOSIS — M1A0610 Idiopathic chronic gout, right knee, without tophus (tophi): Secondary | ICD-10-CM

## 2017-04-04 DIAGNOSIS — E782 Mixed hyperlipidemia: Secondary | ICD-10-CM | POA: Diagnosis not present

## 2017-04-04 MED ORDER — AMLODIPINE BESYLATE 5 MG PO TABS: 5.0000 mg | ORAL_TABLET | Freq: Every day | ORAL | 3 refills | Status: DC

## 2017-04-04 NOTE — Assessment & Plan Note (Signed)
Consider turmeric instead of NSAIDs (cannot take b/c of renal disease)

## 2017-04-04 NOTE — Assessment & Plan Note (Signed)
Has seen specialist; does not wish for procedure to left thumbnail

## 2017-04-04 NOTE — Assessment & Plan Note (Signed)
Well-controlled; continue medicine; refills sent

## 2017-04-04 NOTE — Assessment & Plan Note (Signed)
Managed by liver specialist; reviewed [place] with patient

## 2017-04-04 NOTE — Progress Notes (Signed)
BP 134/74 (BP Location: Left Arm, Patient Position: Sitting, Cuff Size: Normal)   Pulse 94   Temp 97.7 F (36.5 C) (Oral)   Resp 16   Ht 5\' 3"  (1.6 m)   Wt 164 lb 9.6 oz (74.7 kg)   SpO2 99%   BMI 29.16 kg/m    Subjective:    Patient ID: Caitlin Jenkins, female    DOB: 02-24-1932, 83 y.o.   MRN: [id number]  HPI: Caitlin Jenkins is a 83 y.o. female  Chief Complaint  Patient presents with  . Follow-up    6 mo  . Hyperlipidemia  . Hypothyroidism  . Gout   HPI Patient is here for 6 month f/u She had labs done last week Not taking any NSAIDs Gout; uric acid is the best it has been on for 2 years; was eating cherries every day for breakfast; off for two months Hypothyroidism; thyroid test was normal; energy is terrible; wants to take B12; moiving bowels okay; no hair loss Cyst at base of left thumb; saw specialist Feels like a crick in the right side of the neck; no numbness in the right arm; [place] felt of it when she saw her kidney doctor; he said arthritis;  She sees the gastroenterologist for her liver disease, primary biliary cirrhosis US done in June, 2018-- IMPRESSION: 1. No gallstones. 2. Inhomogeneous hepatic parenchyma may represent fatty infiltration but subtle changes of cirrhosis cannot be excluded. 3. Echogenic renal parenchyma bilaterally may indicate chronic renal medical disease. Correlate were renal laboratory values.  Electronically Signed   By: Ivar Drape M.D.   On: 03/23/2017 10:25 Reviewed entire report with patient today  Depression screen [facility] 2/9 04/04/2017 12/14/2016 10/04/2016 05/24/2016 03/13/2015  Decreased Interest 0 0 0 0 0  Down, Depressed, Hopeless 0 0 0 0 0  PHQ - 2 Score 0 0 0 0 0    Relevant past medical, surgical, family and social history reviewed Past Medical History:  Diagnosis Date  . Biliary cirrhosis ([place])   . Cancer of skin of leg   . CKD (chronic kidney disease) stage 3, GFR 30-59 ml/min   . Gout   . Hiatal hernia  Oct 2015   8.5cm  . History of diverticulitis 2000  . Hyperlipidemia   . Hypertension   . Hypothyroidism   . Skin cancer of nose    Past Surgical History:  Procedure Laterality Date  . ABDOMINAL HYSTERECTOMY  1995  . CATARACT EXTRACTION    . FOOT SURGERY  2000  . HEMORRHOID SURGERY    . THYROID SURGERY  1999   para thyroid   Family History  Problem Relation Age of Onset  . Emphysema Father   . Asthma Father   . Hypertension Daughter   . Heart disease Daughter        3 stents  . Hypertension Son   . Cancer Son        prostate  . Diabetes Son   . Heart disease Daughter        heart attack, 2 stents  . Heart disease Daughter        undergoing work-up   Social History   Social History  . Marital status: Widowed    Spouse name: N/A  . Number of children: N/A  . Years of education: N/A   Occupational History  . Not on file.   Social History Main Topics  . Smoking status: Never Smoker  . Smokeless tobacco: Never Used  .  Alcohol use No  . Drug use: No  . Sexual activity: Not on file   Other Topics Concern  . Not on file   Social History Narrative  . No narrative on file   Interim medical history since last visit reviewed. Allergies and medications reviewed  Review of Systems Per HPI unless specifically indicated above     Objective:    BP 134/74 (BP Location: Left Arm, Patient Position: Sitting, Cuff Size: Normal)   Pulse 94   Temp 97.7 F (36.5 C) (Oral)   Resp 16   Ht 5\' 3"  (1.6 m)   Wt 164 lb 9.6 oz (74.7 kg)   SpO2 99%   BMI 29.16 kg/m   Wt Readings from Last 3 Encounters:  04/04/17 164 lb 9.6 oz (74.7 kg)  12/14/16 167 lb 6.4 oz (75.9 kg)  10/04/16 165 lb (74.8 kg)    Physical Exam  Constitutional: She appears well-developed and well-nourished. No distress.  HENT:  Head: Normocephalic and atraumatic.  Eyes: EOM are normal. No scleral icterus.  Neck: No thyromegaly present.  Cardiovascular: Normal rate, regular rhythm and normal heart  sounds.   No murmur heard. Pulmonary/Chest: Effort normal and breath sounds normal. No respiratory distress. She has no wheezes.  Abdominal: She exhibits no distension.  Musculoskeletal: Normal range of motion. She exhibits no edema.  Neurological: She is alert. She exhibits normal muscle tone.  Skin: Skin is warm and dry. She is not diaphoretic. No pallor.  Cystic growth at the base of the LEFT thumb; longitudinal ridges in fingernails  Psychiatric: She has a normal mood and affect. Her behavior is normal. Judgment and thought content normal. Her mood appears not anxious. Cognition and memory are not impaired. She does not exhibit a depressed mood.    Results for orders placed or performed in visit on 03/24/17  Uric acid  Result Value Ref Range   Uric Acid 6.6 2.5 - 7.1 mg/dL  Basic Metabolic Panel (BMET)  Result Value Ref Range   Glucose 100 (H) 65 - 99 mg/dL   BUN 22 8 - 27 mg/dL   Creatinine, Ser 1.25 (H) 0.57 - 1.00 mg/dL   GFR calc non Af Amer 40 (L) >59 mL/min/1.73   GFR calc Af Amer 46 (L) >59 mL/min/1.73   BUN/Creatinine Ratio 18 12 - 28   Sodium 131 (L) 134 - 144 mmol/L   Potassium 5.0 3.5 - 5.2 mmol/L   Chloride 94 (L) 96 - 106 mmol/L   CO2 23 20 - 29 mmol/L   Calcium 10.0 8.7 - 10.3 mg/dL  Lipid panel  Result Value Ref Range   Cholesterol, Total 149 100 - 199 mg/dL   Triglycerides 102 0 - 149 mg/dL   HDL 54 >39 mg/dL   VLDL Cholesterol Cal 20 5 - 40 mg/dL   LDL Calculated 75 0 - 99 mg/dL   Chol/HDL Ratio 2.8 0.0 - 4.4 ratio  TSH  Result Value Ref Range   TSH 2.960 0.450 - 4.500 uIU/mL      Assessment & Plan:   Problem List Items Addressed This Visit      Cardiovascular and Mediastinum   Essential hypertension, benign    Well-controlled; continue medicine; refills sent      Relevant Medications   amLODipine (NORVASC) 5 MG tablet     Digestive   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 Juleen China; avoid NSAIDs        Other   Hyperlipidemia    Doing well on small dose of statin; liver enzymes monitored by liver specialist      Relevant Medications   amLODipine (NORVASC) 5 MG tablet   Gout    Discussed tart cherry juice, avoidance of organ meats and gravies      Arthralgia of multiple joints    Consider turmeric instead of NSAIDs (cannot take b/c of renal disease)          Follow up plan: Return in about 6 months (around 10/05/2017) for follow-up visit with Dr. Sanda Klein; have fasting labs done a few days prior.  An after-visit summary was printed and given to the patient at [place].  Please see the patient instructions which may contain other information and recommendations beyond what is mentioned above in the assessment and plan.  Meds ordered this encounter  Medications  . Niacinamide 500 MG TBCR    Sig: Take by mouth.  [name] amLODipine (NORVASC) 5 MG tablet    Sig: Take 1 tablet (5 mg total) by mouth daily.    Dispense:  90 tablet    Refill:  3    No orders of the defined types were placed in this encounter.

## 2017-04-04 NOTE — Assessment & Plan Note (Signed)
Doing well on small dose of statin; liver enzymes monitored by liver specialist

## 2017-04-04 NOTE — Assessment & Plan Note (Signed)
Discussed tart cherry juice, avoidance of organ meats and gravies

## 2017-04-04 NOTE — Patient Instructions (Addendum)
You can take your B12 vitamins for energy Try to limit saturated fats in your diet (bologna, hot dogs, barbeque, cheeseburgers, hamburgers, steak, bacon, sausage, cheese, etc.) and get more fresh fruits, vegetables, and whole grains Practice good neck stretches Try turmeric as a natural anti-inflammatory (for pain and arthritis). It comes in capsules where you buy aspirin and fish oil, but also as a spice where you buy pepper and garlic powder. If you need something for aches or pains, try to use Tylenol (acetaminophen) instead of non-steroidals (which include Aleve, ibuprofen, Advil, Motrin, and naproxen); non-steroidals can cause long-term kidney damage

## 2017-04-04 NOTE — Assessment & Plan Note (Signed)
Followed by Dr. Juleen China; avoid NSAIDs

## 2017-06-13 DIAGNOSIS — D2261 Melanocytic nevi of right upper limb, including shoulder: Secondary | ICD-10-CM | POA: Diagnosis not present

## 2017-06-13 DIAGNOSIS — D225 Melanocytic nevi of trunk: Secondary | ICD-10-CM | POA: Diagnosis not present

## 2017-06-13 DIAGNOSIS — Z85828 Personal history of other malignant neoplasm of skin: Secondary | ICD-10-CM | POA: Diagnosis not present

## 2017-06-13 DIAGNOSIS — L821 Other seborrheic keratosis: Secondary | ICD-10-CM | POA: Diagnosis not present

## 2017-06-13 DIAGNOSIS — L820 Inflamed seborrheic keratosis: Secondary | ICD-10-CM | POA: Diagnosis not present

## 2017-06-18 IMAGING — US US CAROTID DUPLEX BILAT
1 series · 13 of 24 positions shown · non-contrast
Comparison: No prior.

CLINICAL DATA: Carotid artery disease.

EXAM:
BILATERAL CAROTID DUPLEX ULTRASOUND
TECHNIQUE: Gray scale imaging, color Doppler and duplex ultrasound were
performed of bilateral carotid and vertebral arteries in the neck.

[Series 1: us carotid duplex bilat · 0.06mm/px · 13 of 62 slices shown]
[im 1/62]
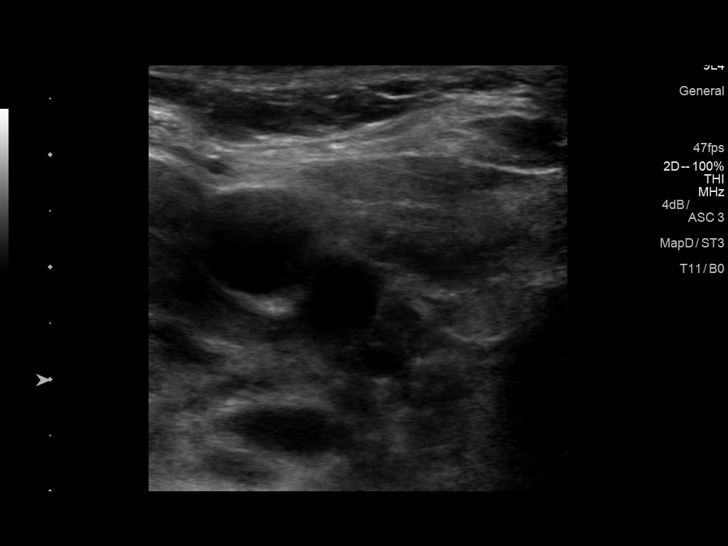
[im 6/62]
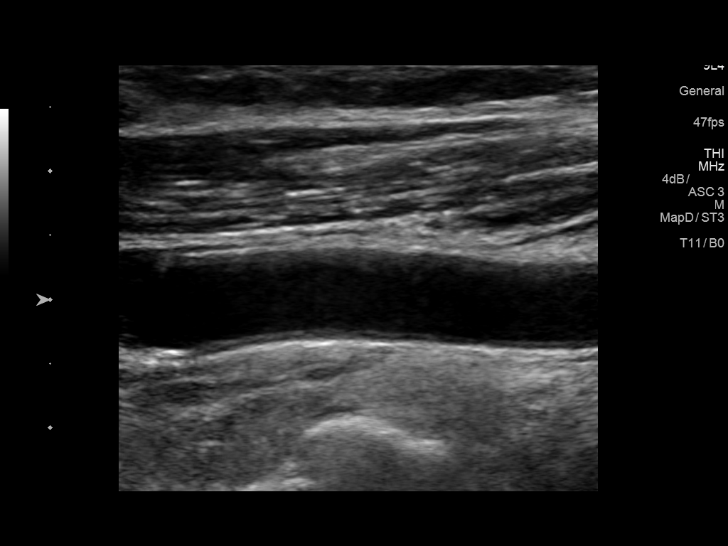
[im 11/62]
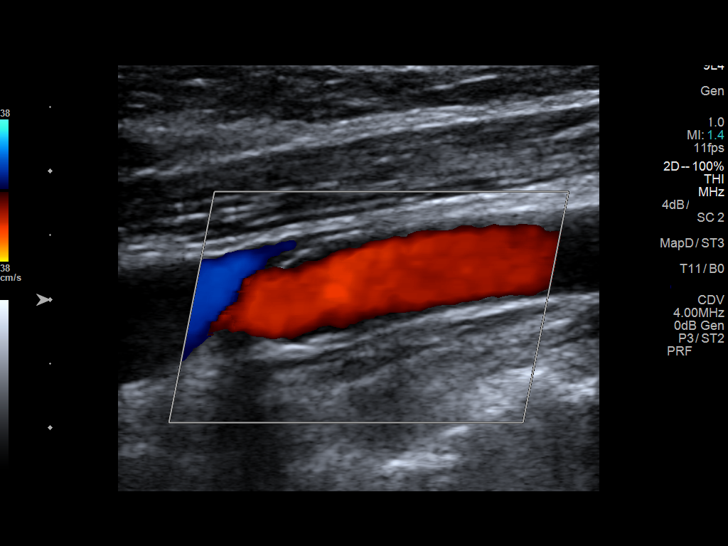
[im 16/62]
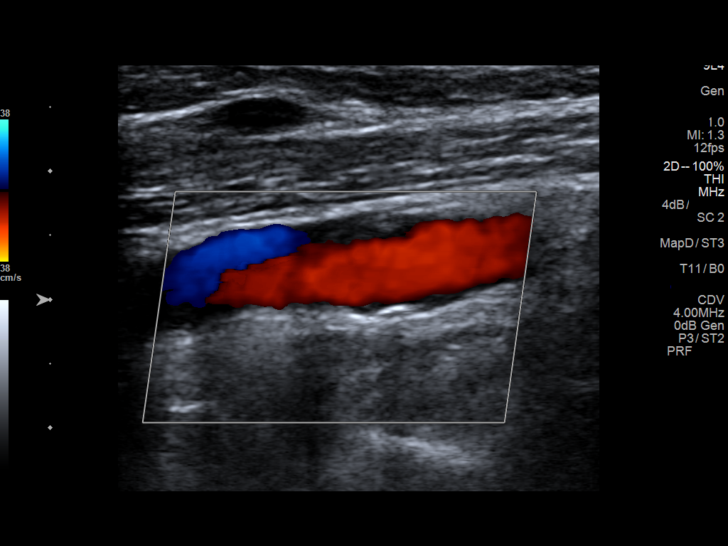
[im 22/62]
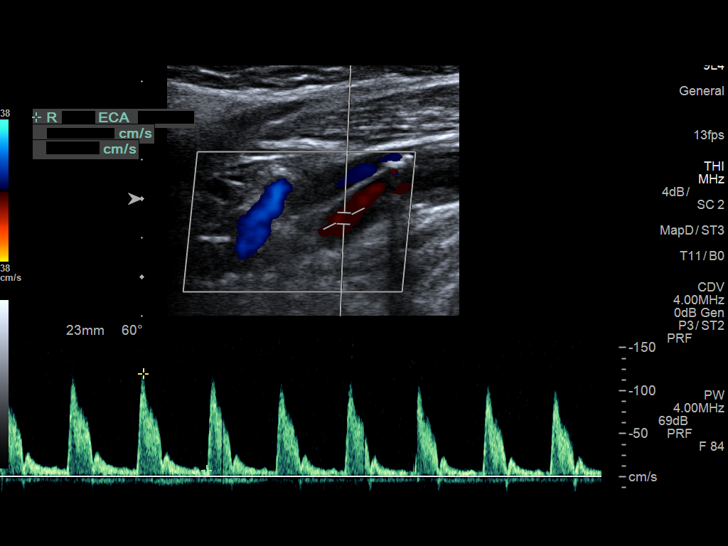
[im 27/62]
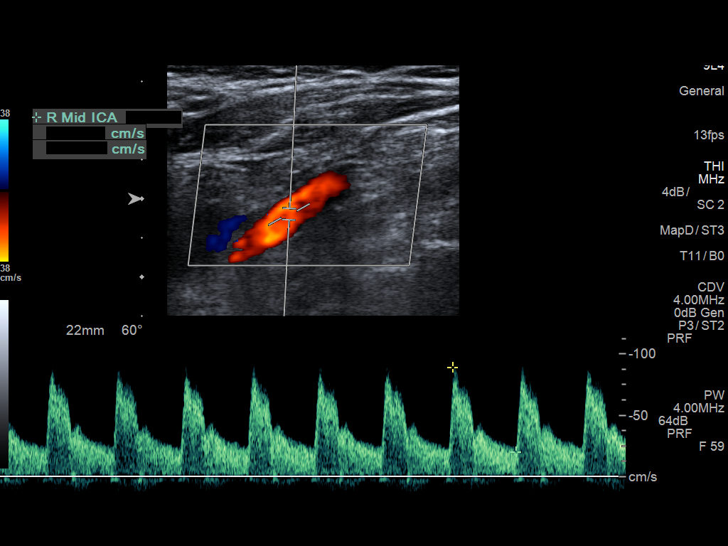
[im 32/62]
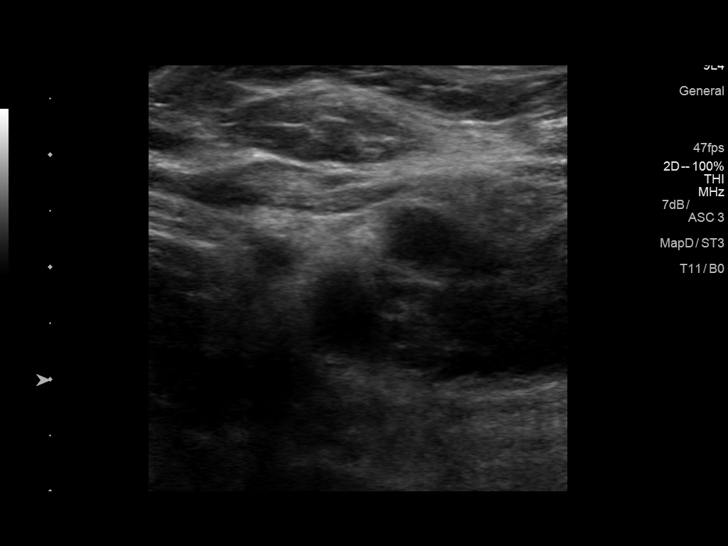
[im 35/62]
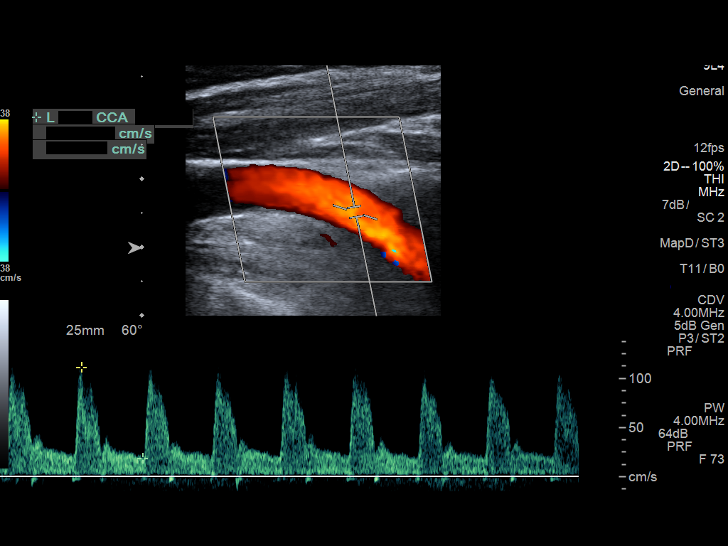
[im 40/62]
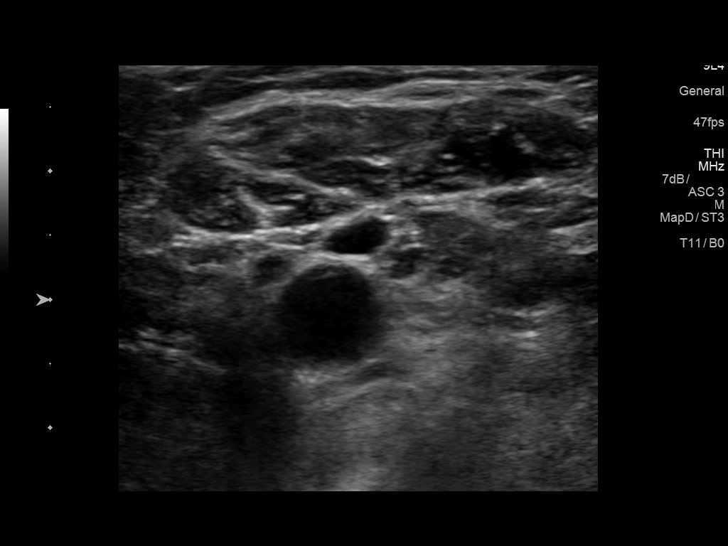
[im 46/62]
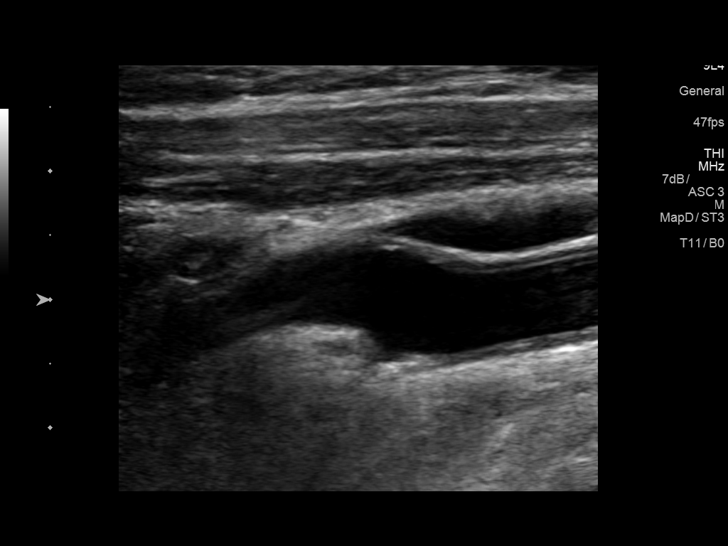
[im 51/62]
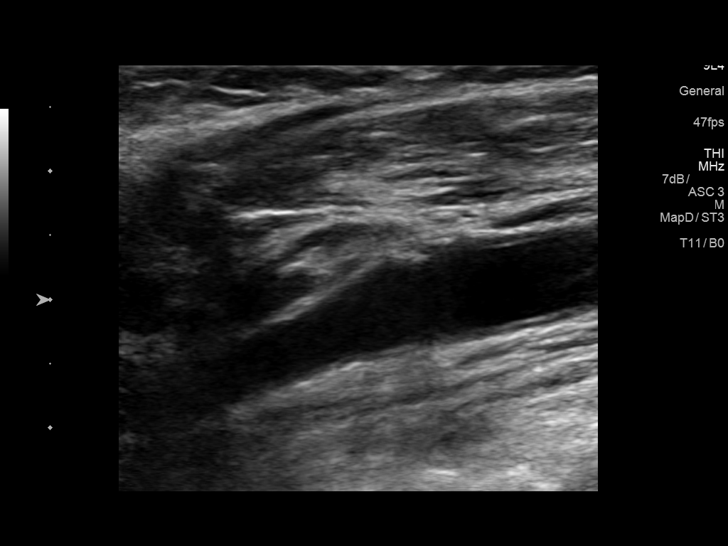
[im 56/62]
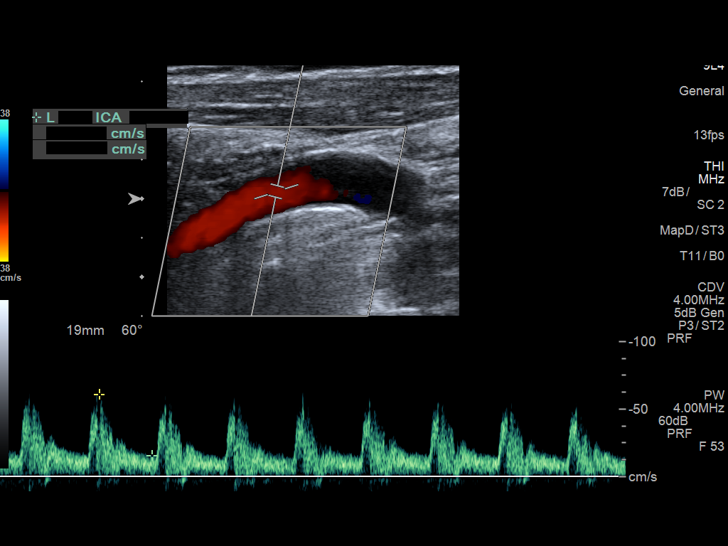
[im 62/62]
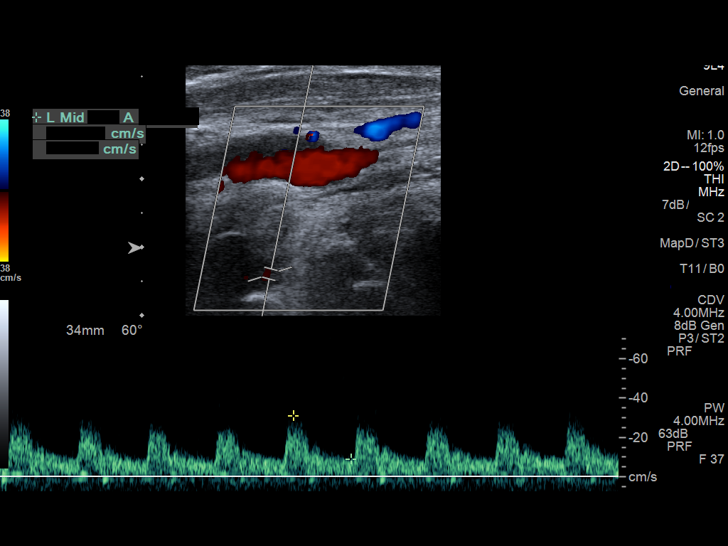

[13 of 24 positions shown; findings below may reference images not displayed]

FINDINGS: Criteria: Quantification of carotid stenosis is based on velocity
parameters that correlate the residual internal carotid diameter
with NASCET-based stenosis levels, using the diameter of the distal
internal carotid lumen as the denominator for stenosis measurement.

The following velocity measurements were obtained:

RIGHT

ICA:  97/27 cm/sec

CCA:  89/15 cm/sec

SYSTOLIC ICA/CCA RATIO:

DIASTOLIC ICA/CCA RATIO:

ECA:  120 cm/sec

LEFT

ICA:  76/22 cm/sec

CCA:  71/15 cm/sec

SYSTOLIC ICA/CCA RATIO:

DIASTOLIC ICA/CCA RATIO:

ECA:  101 cm/sec

RIGHT CAROTID ARTERY: Mild right carotid bifurcation atherosclerotic
vascular disease. No flow limiting stenosis.

RIGHT VERTEBRAL ARTERY:  Patent with antegrade flow.

LEFT CAROTID ARTERY: Mild left carotid bifurcation atherosclerotic
vascular disease.

LEFT VERTEBRAL ARTERY:  Patent with antegrade flow .
IMPRESSION: 1. Mild bilateral carotid atherosclerotic vascular disease. No flow
limiting stenosis. Degree of stenosis both carotid bifurcations less
than 50%.

2. Vertebral arteries are patent antegrade flow.

## 2017-07-31 ENCOUNTER — Encounter: Payer: Self-pay | Admitting: Family Medicine

## 2017-08-07 DIAGNOSIS — E875 Hyperkalemia: Secondary | ICD-10-CM | POA: Diagnosis not present

## 2017-08-07 DIAGNOSIS — M109 Gout, unspecified: Secondary | ICD-10-CM | POA: Diagnosis not present

## 2017-08-07 DIAGNOSIS — I129 Hypertensive chronic kidney disease with stage 1 through stage 4 chronic kidney disease, or unspecified chronic kidney disease: Secondary | ICD-10-CM | POA: Diagnosis not present

## 2017-08-07 DIAGNOSIS — N183 Chronic kidney disease, stage 3 (moderate): Secondary | ICD-10-CM | POA: Diagnosis not present

## 2017-08-27 ENCOUNTER — Encounter: Payer: Self-pay | Admitting: Family Medicine

## 2017-08-29 ENCOUNTER — Ambulatory Visit: Payer: PPO | Admitting: Family Medicine

## 2017-08-29 ENCOUNTER — Encounter: Payer: Self-pay | Admitting: Family Medicine

## 2017-08-29 VITALS — BP 140/84 | HR 90 | Temp 97.6°F | Resp 14 | Ht 64.0 in | Wt 163.3 lb

## 2017-08-29 DIAGNOSIS — R5383 Other fatigue: Secondary | ICD-10-CM | POA: Diagnosis not present

## 2017-08-29 DIAGNOSIS — R059 Cough, unspecified: Secondary | ICD-10-CM

## 2017-08-29 DIAGNOSIS — E890 Postprocedural hypothyroidism: Secondary | ICD-10-CM

## 2017-08-29 DIAGNOSIS — M2550 Pain in unspecified joint: Secondary | ICD-10-CM

## 2017-08-29 DIAGNOSIS — E663 Overweight: Secondary | ICD-10-CM | POA: Diagnosis not present

## 2017-08-29 DIAGNOSIS — Z5181 Encounter for therapeutic drug level monitoring: Secondary | ICD-10-CM

## 2017-08-29 DIAGNOSIS — R05 Cough: Secondary | ICD-10-CM

## 2017-08-29 DIAGNOSIS — N183 Chronic kidney disease, stage 3 unspecified: Secondary | ICD-10-CM

## 2017-08-29 DIAGNOSIS — E782 Mixed hyperlipidemia: Secondary | ICD-10-CM | POA: Diagnosis not present

## 2017-08-29 DIAGNOSIS — M1A0610 Idiopathic chronic gout, right knee, without tophus (tophi): Secondary | ICD-10-CM | POA: Diagnosis not present

## 2017-08-29 MED ORDER — BENZONATATE 100 MG PO CAPS: 100.0000 mg | ORAL_CAPSULE | Freq: Three times a day (TID) | ORAL | 0 refills | Status: DC | PRN

## 2017-08-29 MED ORDER — DOXYCYCLINE HYCLATE 100 MG PO TABS: 100.0000 mg | ORAL_TABLET | Freq: Two times a day (BID) | ORAL | 0 refills | Status: DC

## 2017-08-29 NOTE — Assessment & Plan Note (Signed)
Intentional weight loss 

## 2017-08-29 NOTE — Assessment & Plan Note (Signed)
Keep an eye on liver and kidneys, checked by other providers

## 2017-08-29 NOTE — Progress Notes (Signed)
BP 140/84 (BP Location: Right Arm, Patient Position: Sitting, Cuff Size: Normal)   Pulse 90   Temp 97.6 F (36.4 C) (Oral)   Resp 14   Ht 5\' 4"  (1.626 m)   Wt 163 lb 4.8 oz (74.1 kg)   SpO2 98%   BMI 28.03 kg/m    Subjective:    Patient ID: Caitlin Jenkins, female    DOB: [birth year], 83 y.o.   MRN: [id number]  HPI: Caitlin Jenkins is a 83 y.o. female  No chief complaint on file. CC: "I'm coughing up all this mess"  HPI Patient got sick 3 weeks ago today Started with cough, then sneezing Took golden seal treatment at the beach Then took tessalon perles The whole time, she has been coughing it up and blowing it out; coming out the whole time Beginning to make her throat sore from coughing so much No fevers, but does not usually run one No body aches Took flowers to hospital before getting sick, so she may have been exposed to something there She did start to get better and was actually much better over Thanksgiving, then got worse again Hx of pneumonia  She would like orders to take to do in January so the results will be back in January Liver doctor is checking her liver enzymes, but that was months ago Kidney doctor is checking her kidney function about 3 weeks; she says they are "stable"; avoiding NSAIDs She would like uric acid checked; no recent gout flares Really tired; no hx of anemia; taking B12 Hypothyroidism; would like thyroid checked  Depression screen [facility] 2/9 04/04/2017 12/14/2016 10/04/2016 05/24/2016 03/13/2015  Decreased Interest 0 0 0 0 0  Down, Depressed, Hopeless 0 0 0 0 0  PHQ - 2 Score 0 0 0 0 0    Relevant past medical, surgical, family and social history reviewed Past Medical History:  Diagnosis Date  . Biliary cirrhosis ([place])   . Cancer of skin of leg   . CKD (chronic kidney disease) stage 3, GFR 30-59 ml/min (HCC)   . Gout   . Hiatal hernia Oct 2015   8.5cm  . History of diverticulitis 2000  . Hyperlipidemia   . Hypertension   .  Hypothyroidism   . Skin cancer of nose    Past Surgical History:  Procedure Laterality Date  . ABDOMINAL HYSTERECTOMY  1995  . CATARACT EXTRACTION    . FOOT SURGERY  2000  . HEMORRHOID SURGERY    . THYROID SURGERY  1999   para thyroid   Family History  Problem Relation Age of Onset  . Emphysema Father   . Asthma Father   . Hypertension Daughter   . Heart disease Daughter        3 stents  . Hypertension Son   . Cancer Son        prostate  . Diabetes Son   . Heart disease Daughter        heart attack, 2 stents  . Heart disease Daughter        undergoing work-up   Social History   Tobacco Use  . Smoking status: Never Smoker  . Smokeless tobacco: Never Used  Substance Use Topics  . Alcohol use: No  . Drug use: No   Interim medical history since last visit reviewed. Allergies and medications reviewed  Review of Systems Per HPI unless specifically indicated above     Objective:    BP 140/84 (BP Location: Right Arm, Patient Position:  Sitting, Cuff Size: Normal)   Pulse 90   Temp 97.6 F (36.4 C) (Oral)   Resp 14   Ht 5\' 4"  (1.626 m)   Wt 163 lb 4.8 oz (74.1 kg)   SpO2 98%   BMI 28.03 kg/m   Wt Readings from Last 3 Encounters:  08/29/17 163 lb 4.8 oz (74.1 kg)  04/04/17 164 lb 9.6 oz (74.7 kg)  12/14/16 167 lb 6.4 oz (75.9 kg)    Physical Exam  Constitutional: She appears well-developed and well-nourished.  HENT:  Right Ear: Tympanic membrane and ear canal normal. Tympanic membrane is not erythematous. No middle ear effusion. Decreased hearing is noted.  Left Ear: Tympanic membrane and ear canal normal. Tympanic membrane is not erythematous.  No middle ear effusion. Decreased hearing is noted.  Nose: Rhinorrhea present.  Mouth/Throat: Oropharynx is clear and moist and mucous membranes are normal. No posterior oropharyngeal edema or posterior oropharyngeal erythema.  Bilateral hearing aides removed for exam  Eyes: EOM are normal. Right eye exhibits no  discharge. Left eye exhibits no discharge. Right conjunctiva is not injected. Right conjunctiva has no hemorrhage. Left conjunctiva is not injected. Left conjunctiva has no hemorrhage. No scleral icterus.  Cardiovascular: Normal rate and regular rhythm.  Pulmonary/Chest: Effort normal. No accessory muscle usage. No respiratory distress. She has no decreased breath sounds. She has rhonchi (scattered).  Lymphadenopathy:    She has no cervical adenopathy.  Psychiatric: She has a normal mood and affect. Her behavior is normal. Her mood appears not anxious. She does not exhibit a depressed mood.    Results for orders placed or performed in visit on 03/24/17  Uric acid  Result Value Ref Range   Uric Acid 6.6 2.5 - 7.1 mg/dL  Basic Metabolic Panel (BMET)  Result Value Ref Range   Glucose 100 (H) 65 - 99 mg/dL   BUN 22 8 - 27 mg/dL   Creatinine, Ser 1.25 (H) 0.57 - 1.00 mg/dL   GFR calc non Af Amer 40 (L) >59 mL/min/1.73   GFR calc Af Amer 46 (L) >59 mL/min/1.73   BUN/Creatinine Ratio 18 12 - 28   Sodium 131 (L) 134 - 144 mmol/L   Potassium 5.0 3.5 - 5.2 mmol/L   Chloride 94 (L) 96 - 106 mmol/L   CO2 23 20 - 29 mmol/L   Calcium 10.0 8.7 - 10.3 mg/dL  Lipid panel  Result Value Ref Range   Cholesterol, Total 149 100 - 199 mg/dL   Triglycerides 102 0 - 149 mg/dL   HDL 54 >39 mg/dL   VLDL Cholesterol Cal 20 5 - 40 mg/dL   LDL Calculated 75 0 - 99 mg/dL   Chol/HDL Ratio 2.8 0.0 - 4.4 ratio  TSH  Result Value Ref Range   TSH 2.960 0.450 - 4.500 uIU/mL      Assessment & Plan:   Problem List Items Addressed This Visit      Endocrine   Hypothyroidism    Last TSH was in July      Relevant Orders   TSH     Genitourinary   CKD (chronic kidney disease) stage 3, GFR 30-59 ml/min (HCC)    Managed by kidney doctor; just had labs checked 3 weeks ago        Other   Overweight (BMI 25.0-29.9)    Intentional weight loss      Medication monitoring encounter    Keep an eye on liver  and kidneys, checked by other providers  Hyperlipidemia    Check lipids in January, fasting; limit saturated fats      Relevant Orders   Lipid panel   Gout    Check uric acid; using tart cherries      Relevant Orders   Uric acid   Arthralgia of multiple joints    Check vit D      Relevant Orders   VITAMIN D 25 Hydroxy (Vit-D Deficiency, Fractures)    Other Visit Diagnoses    Cough    -  Primary   likely started viral process, now secondary bacterial process; start antibiotics; tessalon perles; c diff precautions given   Other fatigue       Relevant Orders   VITAMIN D 25 Hydroxy (Vit-D Deficiency, Fractures)   B12   CBC with Differential/Platelet   TSH       Follow up plan: No Follow-up on file.  An after-visit summary was printed and given to the patient at [place].  Please see the patient instructions which may contain other information and recommendations beyond what is mentioned above in the assessment and plan.  Meds ordered this encounter  Medications  . benzonatate (TESSALON PERLES) 100 MG capsule    Sig: Take 1 capsule (100 mg total) by mouth every 8 (eight) hours as needed for cough.    Dispense:  30 capsule    Refill:  0  . doxycycline (VIBRA-TABS) 100 MG tablet    Sig: Take 1 tablet (100 mg total) by mouth 2 (two) times daily.    Dispense:  20 tablet    Refill:  0    Orders Placed This Encounter  Procedures  . Uric acid  . Lipid panel  . VITAMIN D 25 Hydroxy (Vit-D Deficiency, Fractures)  . B12  . CBC with Differential/Platelet  . TSH

## 2017-08-29 NOTE — Assessment & Plan Note (Signed)
Check vit D 

## 2017-08-29 NOTE — Assessment & Plan Note (Signed)
Managed by kidney doctor; just had labs checked 3 weeks ago

## 2017-08-29 NOTE — Assessment & Plan Note (Signed)
Check lipids in January, fasting; limit saturated fats

## 2017-08-29 NOTE — Assessment & Plan Note (Signed)
Last TSH was in July

## 2017-08-29 NOTE — Assessment & Plan Note (Signed)
Check uric acid; using tart cherries

## 2017-08-29 NOTE — Patient Instructions (Signed)
Try vitamin C (orange juice if not diabetic or vitamin C tablets) and drink green tea to help your immune system during your illness Get plenty of rest and hydration Start the antibiotics Use the cough medicine if needed Please do eat yogurt or kimchi or take a probiotic daily for the next month We want to replace the healthy germs in the gut If you notice foul, watery diarrhea in the next two months, schedule an appointment RIGHT AWAY or go to an urgent care or the emergency room if a holiday or over a weekend

## 2017-09-06 DIAGNOSIS — Z961 Presence of intraocular lens: Secondary | ICD-10-CM | POA: Diagnosis not present

## 2017-09-13 ENCOUNTER — Telehealth: Payer: Self-pay | Admitting: Family Medicine

## 2017-09-13 NOTE — Telephone Encounter (Signed)
Please ask patient to come in soon for her labs

## 2017-09-13 NOTE — Telephone Encounter (Signed)
Pt.notified

## 2017-09-29 ENCOUNTER — Telehealth: Payer: Self-pay

## 2017-09-29 ENCOUNTER — Other Ambulatory Visit: Payer: Self-pay | Admitting: Family Medicine

## 2017-09-29 DIAGNOSIS — E890 Postprocedural hypothyroidism: Secondary | ICD-10-CM | POA: Diagnosis not present

## 2017-09-29 DIAGNOSIS — E782 Mixed hyperlipidemia: Secondary | ICD-10-CM | POA: Diagnosis not present

## 2017-09-29 DIAGNOSIS — M1A0610 Idiopathic chronic gout, right knee, without tophus (tophi): Secondary | ICD-10-CM | POA: Diagnosis not present

## 2017-09-29 DIAGNOSIS — M2550 Pain in unspecified joint: Secondary | ICD-10-CM | POA: Diagnosis not present

## 2017-09-29 DIAGNOSIS — K743 Primary biliary cirrhosis: Secondary | ICD-10-CM

## 2017-09-29 DIAGNOSIS — R5383 Other fatigue: Secondary | ICD-10-CM | POA: Diagnosis not present

## 2017-09-29 NOTE — Assessment & Plan Note (Signed)
Add on alpha-fetoprotein

## 2017-09-29 NOTE — Telephone Encounter (Signed)
Copied from [place] #[id number]. Topic: General - Other >> Sep 29, 2017  3:38 PM Yvette Rack wrote: Reason for CRM: Lanelle Bal from [facility] [phone] call to see if you can add a Alpha Fetal Protein to patients labs that was done today I tried to reach someone in the office they had me on hold for the longest Time so I advise [facility] that someone will give them a call back today to add onto the rest of the blood work   Dr.Lada are you aware of any orders needing to be added for this pt? Please advise. Thanks!

## 2017-09-29 NOTE — Telephone Encounter (Signed)
Yes, fine to ADD ON I have entered the orders and they just need to be released and given to [facility] Thank you

## 2017-09-29 NOTE — Progress Notes (Signed)
Note to pt through MyChart about labs

## 2017-10-02 ENCOUNTER — Other Ambulatory Visit: Payer: Self-pay | Admitting: Nurse Practitioner

## 2017-10-02 DIAGNOSIS — K7460 Unspecified cirrhosis of liver: Secondary | ICD-10-CM

## 2017-10-02 DIAGNOSIS — K743 Primary biliary cirrhosis: Secondary | ICD-10-CM

## 2017-10-02 NOTE — Telephone Encounter (Signed)
Order released and given to lab technician.

## 2017-10-03 LAB — CBC WITH DIFFERENTIAL/PLATELET
BASOS PCT: 0.5 %
Basophils Absolute: 38 cells/uL (ref 0–200)
EOS ABS: 263 {cells}/uL (ref 15–500)
Eosinophils Relative: 3.5 %
HCT: 39 % (ref 35.0–45.0)
HEMOGLOBIN: 13.1 g/dL (ref 11.7–15.5)
Lymphs Abs: 2633 cells/uL (ref 850–3900)
MCH: 29.7 pg (ref 27.0–33.0)
MCHC: 33.6 g/dL (ref 32.0–36.0)
MCV: 88.4 fL (ref 80.0–100.0)
MPV: 8.9 fL (ref 7.5–12.5)
Monocytes Relative: 8.1 %
Neutro Abs: 3960 cells/uL (ref 1500–7800)
Neutrophils Relative %: 52.8 %
Platelets: 253 10*3/uL (ref 140–400)
RBC: 4.41 10*6/uL (ref 3.80–5.10)
RDW: 12.8 % (ref 11.0–15.0)
Total Lymphocyte: 35.1 %
WBC: 7.5 10*3/uL (ref 3.8–10.8)
WBCMIX: 608 {cells}/uL (ref 200–950)

## 2017-10-03 LAB — TSH: TSH: 2.36 mIU/L (ref 0.40–4.50)

## 2017-10-03 LAB — LIPID PANEL
CHOLESTEROL: 172 mg/dL (ref <200)
HDL: 65 mg/dL (ref >50)
LDL CHOLESTEROL (CALC): 86 mg/dL
Non-HDL Cholesterol (Calc): 107 mg/dL (calc) (ref <130)
Total CHOL/HDL Ratio: 2.6 (calc) (ref <5.0)
Triglycerides: 117 mg/dL (ref <150)

## 2017-10-03 LAB — TEST AUTHORIZATION

## 2017-10-03 LAB — VITAMIN B12

## 2017-10-03 LAB — VITAMIN D 25 HYDROXY (VIT D DEFICIENCY, FRACTURES): Vit D, 25-Hydroxy: 36 ng/mL (ref 30–100)

## 2017-10-03 LAB — AFP TUMOR MARKER: AFP TUMOR MARKER: 2.5 ng/mL

## 2017-10-03 LAB — URIC ACID: URIC ACID, SERUM: 7.4 mg/dL — AB (ref 2.5–7.0)

## 2017-10-05 ENCOUNTER — Ambulatory Visit (INDEPENDENT_AMBULATORY_CARE_PROVIDER_SITE_OTHER): Payer: PPO | Admitting: Family Medicine

## 2017-10-05 ENCOUNTER — Encounter: Payer: Self-pay | Admitting: Family Medicine

## 2017-10-05 VITALS — BP 140/78 | HR 89 | Temp 97.8°F | Ht 64.0 in | Wt 163.7 lb

## 2017-10-05 DIAGNOSIS — M1A0610 Idiopathic chronic gout, right knee, without tophus (tophi): Secondary | ICD-10-CM | POA: Diagnosis not present

## 2017-10-05 DIAGNOSIS — E782 Mixed hyperlipidemia: Secondary | ICD-10-CM

## 2017-10-05 DIAGNOSIS — R05 Cough: Secondary | ICD-10-CM

## 2017-10-05 DIAGNOSIS — N183 Chronic kidney disease, stage 3 unspecified: Secondary | ICD-10-CM

## 2017-10-05 DIAGNOSIS — E890 Postprocedural hypothyroidism: Secondary | ICD-10-CM

## 2017-10-05 DIAGNOSIS — R059 Cough, unspecified: Secondary | ICD-10-CM

## 2017-10-05 DIAGNOSIS — W57XXXA Bitten or stung by nonvenomous insect and other nonvenomous arthropods, initial encounter: Secondary | ICD-10-CM

## 2017-10-05 DIAGNOSIS — K743 Primary biliary cirrhosis: Secondary | ICD-10-CM

## 2017-10-05 DIAGNOSIS — S30861A Insect bite (nonvenomous) of abdominal wall, initial encounter: Secondary | ICD-10-CM | POA: Diagnosis not present

## 2017-10-05 MED ORDER — LEVOTHYROXINE SODIUM 100 MCG PO TABS: 100.0000 ug | ORAL_TABLET | Freq: Every day | ORAL | 3 refills | Status: DC

## 2017-10-05 MED ORDER — LOSARTAN POTASSIUM 100 MG PO TABS: 100.0000 mg | ORAL_TABLET | Freq: Every day | ORAL | 3 refills | Status: DC

## 2017-10-05 MED ORDER — ATORVASTATIN CALCIUM 10 MG PO TABS: 10.0000 mg | ORAL_TABLET | Freq: Every day | ORAL | 3 refills | Status: DC

## 2017-10-05 MED ORDER — NIACINAMIDE ER 500 MG PO TBCR: 500.0000 mg | EXTENDED_RELEASE_TABLET | Freq: Two times a day (BID) | ORAL | 1 refills | Status: DC

## 2017-10-05 NOTE — Assessment & Plan Note (Signed)
Seeing nephrologist; reviewed his last office note; labs done there

## 2017-10-05 NOTE — Assessment & Plan Note (Signed)
Reviewed last labs with her; f/u with GI

## 2017-10-05 NOTE — Assessment & Plan Note (Signed)
Reviewed last labs with her, including normal alpha-fetoprotein; f/u with GI specialist

## 2017-10-05 NOTE — Progress Notes (Signed)
BP 140/78 (BP Location: Left Arm, Patient Position: Sitting, Cuff Size: Normal)   Pulse 89   Temp 97.8 F (36.6 C) (Oral)   Ht 5\' 4"  (1.626 m)   Wt 163 lb 11.2 oz (74.3 kg)   SpO2 97%   BMI 28.10 kg/m    Subjective:    Patient ID: Caitlin Jenkins, female    DOB: [birth year], 84 y.o.   MRN: [id number]  HPI: Caitlin Jenkins is a 84 y.o. female  Chief Complaint  Patient presents with  . Cough    Still coughing up yellow mucus, noticing mostly when eating or drinking  . Insect Bite    Possible spider bite on abdomen, x 2 weeks   . Medication Refill  . Cerumen Impaction    Wants ears checked possible wash     HPI Patient is here for several concerns  She thinks she was bitten by a spider on her abdomen; started two weeks ago; she came out of hte shower and was drying off and felt something bite her on the stomach; never saw it; put some neosporin on it; started itching again; now two red spots; more neosporin; no fevers; belly never got hard; no night sweats  She wants to have her ears checked; would like them washed out if needed; she wears hearing aides   She is still coughing; bringing up yellow mucous; more noticeable when eating or drinking; not as much of a cough; still having phlegm; does consume considerable dairy; does not want chest xray  She needs refills of medicines; reviewed her list and sent refills  She has gout; avoiding [place]; does eat gravy and chicken; cooks with chicken soup, trying to do vegetable broth instead of chicken stock; she has not tolerated allopurinol and does not want to try Uloric; just saw the nephrologist and this was discussed, noted in his office visit  Alpha-fetoprotein was normal; reviewed all the other labs with her today  She has high cholesterol and is on low dose statin, carefully monitored because of her PBC; she says her cancer doctor wants her on two niacin a day (BID), and she would like refills of that  Depression screen  [facility] 2/9 04/04/2017 12/14/2016 10/04/2016 05/24/2016 03/13/2015  Decreased Interest 0 0 0 0 0  Down, Depressed, Hopeless 0 0 0 0 0  PHQ - 2 Score 0 0 0 0 0    Relevant past medical, surgical, family and social history reviewed Past Medical History:  Diagnosis Date  . Biliary cirrhosis ([place])   . Cancer of skin of leg   . CKD (chronic kidney disease) stage 3, GFR 30-59 ml/min (HCC)   . Gout   . Hiatal hernia Oct 2015   8.5cm  . History of diverticulitis 2000  . Hyperlipidemia   . Hypertension   . Hypothyroidism   . Skin cancer of nose    Past Surgical History:  Procedure Laterality Date  . ABDOMINAL HYSTERECTOMY  1995  . CATARACT EXTRACTION    . FOOT SURGERY  2000  . HEMORRHOID SURGERY    . THYROID SURGERY  1999   para thyroid   Family History  Problem Relation Age of Onset  . Emphysema Father   . Asthma Father   . Hypertension Daughter   . Heart disease Daughter        3 stents  . Hypertension Son   . Cancer Son        prostate  . Diabetes Son   .  Heart disease Daughter        heart attack, 2 stents  . Heart disease Daughter        undergoing work-up   Social History   Tobacco Use  . Smoking status: Never Smoker  . Smokeless tobacco: Never Used  Substance Use Topics  . Alcohol use: No  . Drug use: No    Interim medical history since last visit reviewed. Allergies and medications reviewed  Review of Systems Per HPI unless specifically indicated above     Objective:    BP 140/78 (BP Location: Left Arm, Patient Position: Sitting, Cuff Size: Normal)   Pulse 89   Temp 97.8 F (36.6 C) (Oral)   Ht 5\' 4"  (1.626 m)   Wt 163 lb 11.2 oz (74.3 kg)   SpO2 97%   BMI 28.10 kg/m   Wt Readings from Last 3 Encounters:  10/05/17 163 lb 11.2 oz (74.3 kg)  08/29/17 163 lb 4.8 oz (74.1 kg)  04/04/17 164 lb 9.6 oz (74.7 kg)    Physical Exam  Constitutional: She appears well-developed and well-nourished.  HENT:  Right Ear: External ear normal. Decreased hearing is  noted.  Left Ear: External ear normal. Decreased hearing is noted.  Nose: No rhinorrhea.  Mouth/Throat: Oropharynx is clear and moist and mucous membranes are normal.  Hearing aides bilaterally; cerumen removed under direct visualization by the MD with curette from the RIGHT ear; left ear canal was clear  Eyes: EOM are normal. No scleral icterus.  Cardiovascular: Normal rate and regular rhythm.  Pulmonary/Chest: Effort normal and breath sounds normal. No accessory muscle usage. No respiratory distress. She has no decreased breath sounds. She has no wheezes. She has no rhonchi.  Abdominal: Soft. She exhibits no distension. There is no tenderness.  Two superficial bite marks; no induration, no abscess  Psychiatric: She has a normal mood and affect. Her behavior is normal.    Results for orders placed or performed in visit on 08/29/17  Uric acid  Result Value Ref Range   Uric Acid, Serum 7.4 (H) 2.5 - 7.0 mg/dL  Lipid panel  Result Value Ref Range   Cholesterol 172 <200 mg/dL   HDL 65 >50 mg/dL   Triglycerides 117 <150 mg/dL   LDL Cholesterol (Calc) 86 mg/dL (calc)   Total CHOL/HDL Ratio 2.6 <5.0 (calc)   Non-HDL Cholesterol (Calc) 107 <130 mg/dL (calc)  VITAMIN D 25 Hydroxy (Vit-D Deficiency, Fractures)  Result Value Ref Range   Vit D, 25-Hydroxy 36 30 - 100 ng/mL  B12  Result Value Ref Range   Vitamin B-12 >2,000 (H) 200 - 1,100 pg/mL  CBC with Differential/Platelet  Result Value Ref Range   WBC 7.5 3.8 - 10.8 Thousand/uL   RBC 4.41 3.80 - 5.10 Million/uL   Hemoglobin 13.1 11.7 - 15.5 g/dL   HCT 39.0 35.0 - 45.0 %   MCV 88.4 80.0 - 100.0 fL   MCH 29.7 27.0 - 33.0 pg   MCHC 33.6 32.0 - 36.0 g/dL   RDW 12.8 11.0 - 15.0 %   Platelets 253 140 - 400 Thousand/uL   MPV 8.9 7.5 - 12.5 fL   Neutro Abs 3,960 1,500 - 7,800 cells/uL   Lymphs Abs 2,633 850 - 3,900 cells/uL   WBC mixed population 608 200 - 950 cells/uL   Eosinophils Absolute 263 15 - 500 cells/uL   Basophils Absolute  38 0 - 200 cells/uL   Neutrophils Relative % 52.8 %   Total Lymphocyte 35.1 %  Monocytes Relative 8.1 %   Eosinophils Relative 3.5 %   Basophils Relative 0.5 %  TSH  Result Value Ref Range   TSH 2.36 0.40 - 4.50 mIU/L  TEST AUTHORIZATION  Result Value Ref Range   TEST NAME: ALPHA FETOPROTEIN,    TEST CODE: [id number]    CLIENT CONTACT: TELISA TATUM    REPORT ALWAYS MESSAGE SIGNATURE    AFP tumor marker  Result Value Ref Range   AFP-Tumor Marker 2.5 ng/mL      Assessment & Plan:   Problem List Items Addressed This Visit      Digestive   Primary biliary cholangitis ([place])    Reviewed last labs with her; f/u with GI      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[place])    Reviewed last labs with her, including normal alpha-fetoprotein; f/u with GI specialist        Endocrine   Hypothyroidism    Normal TSH, continue medicine      Relevant Medications   levothyroxine (SYNTHROID, LEVOTHROID) 100 MCG tablet     Genitourinary   CKD (chronic kidney disease) stage 3, GFR 30-59 ml/min (HCC)    Seeing nephrologist; reviewed his last office note; labs done there        Other   Hyperlipidemia - Primary    Reviewed great response to her cholesterol medicine; refills provided      Relevant Medications   atorvastatin (LIPITOR) 10 MG tablet   losartan (COZAAR) 100 MG tablet   Gout    She has not tolerated allopurinol; she does not want Uloric; discussed dietary adjustments she could try; if she needs colchicine, do not take statin for 3 days       Other Visit Diagnoses    Arthropod bite of abdominal wall, initial encounter       no indicatio for antibiotics based on today's assessment; should resolve on its own   Cough       residual; try avoiding all dairy for one month; she was not interested in CXR; if cough not resolving soon, let me know for work-up       Follow up plan: No Follow-up on file.  An after-visit summary was printed and given to the  patient at [place].  Please see the patient instructions which may contain other information and recommendations beyond what is mentioned above in the assessment and plan.  Meds ordered this encounter  Medications  . Niacinamide 500 MG TBCR    Sig: Take 500 mg by mouth 2 (two) times daily.    Dispense:  180 tablet    Refill:  1  . levothyroxine (SYNTHROID, LEVOTHROID) 100 MCG tablet    Sig: Take 1 tablet (100 mcg total) by mouth daily.    Dispense:  90 tablet    Refill:  3  . atorvastatin (LIPITOR) 10 MG tablet    Sig: Take 1 tablet (10 mg total) by mouth at bedtime.    Dispense:  90 tablet    Refill:  3  . losartan (COZAAR) 100 MG tablet    Sig: Take 1 tablet (100 mg total) by mouth daily. Avoid salt substitutes, no fruit smoothies    Dispense:  90 tablet    Refill:  3    No orders of the defined types were placed in this encounter.

## 2017-10-05 NOTE — Assessment & Plan Note (Signed)
Reviewed great response to her cholesterol medicine; refills provided

## 2017-10-05 NOTE — Assessment & Plan Note (Signed)
Normal TSH, continue medicine

## 2017-10-05 NOTE — Patient Instructions (Addendum)
If you do have to take your colchicine, do NOT take the atorvastatin for three days I'll suggest that you try giving up dairy for one month to see if that helps the drainage

## 2017-10-05 NOTE — Assessment & Plan Note (Signed)
She has not tolerated allopurinol; she does not want Uloric; discussed dietary adjustments she could try; if she needs colchicine, do not take statin for 3 days

## 2017-10-06 ENCOUNTER — Encounter: Payer: Self-pay | Admitting: Family Medicine

## 2017-10-07 ENCOUNTER — Encounter: Payer: Self-pay | Admitting: Family Medicine

## 2017-10-09 ENCOUNTER — Ambulatory Visit
Admission: RE | Admit: 2017-10-09 | Discharge: 2017-10-09 | Disposition: A | Discharge status: Home / Self Care | Payer: PPO | Source: Ambulatory Visit | Attending: Nurse Practitioner | Admitting: Nurse Practitioner

## 2017-10-09 DIAGNOSIS — K743 Primary biliary cirrhosis: Secondary | ICD-10-CM | POA: Diagnosis not present

## 2017-10-09 DIAGNOSIS — R93429 Abnormal radiologic findings on diagnostic imaging of unspecified kidney: Secondary | ICD-10-CM | POA: Insufficient documentation

## 2017-10-09 DIAGNOSIS — K7460 Unspecified cirrhosis of liver: Secondary | ICD-10-CM | POA: Diagnosis not present

## 2017-10-09 NOTE — Telephone Encounter (Signed)
Duplicate message. 

## 2017-10-20 ENCOUNTER — Telehealth: Payer: Self-pay | Admitting: Family Medicine

## 2017-10-20 NOTE — Telephone Encounter (Signed)
[facility] called in to get clarity on medication request for Niacinamide. They would like to know if provider is requesting the OTC (not covered by insurance) OR a Rx ?    Please advise.   Lattie Haw [phone] - Okay to lvm

## 2017-10-20 NOTE — Telephone Encounter (Signed)
Called Caitlin Jenkins at [facility], no answer. LM for pt informing her that we would like Niacinamide to be an RX. However if it is not covered by insurance pt can get over the counter.

## 2017-10-23 ENCOUNTER — Telehealth: Payer: Self-pay | Admitting: Family Medicine

## 2017-10-23 MED ORDER — NIACIN ER 500 MG PO CPCR: 500.0000 mg | ORAL_CAPSULE | Freq: Two times a day (BID) | ORAL | 1 refills | Status: DC

## 2017-10-23 NOTE — Telephone Encounter (Signed)
Please see below and clarify if rx needs to be changed. Thanks!

## 2017-10-23 NOTE — Telephone Encounter (Signed)
Niacin    Medication   question     LOV   10-05-2017      Last  fill   10-05-2017     Pharmacy   [place]

## 2017-10-23 NOTE — Telephone Encounter (Signed)
Copied from [place]. Topic: Quick Communication - See Telephone Encounter >> Oct 23, 2017  9:15 AM Boyd Kerbs wrote: CRM for notification. See Telephone encounter for:   Lisa, [place] [phone] asking about prescription clarification : Niacinamide is over the counter and ins. Does not cover..    Asking doctor if he wants this or Niacin Extended release ins will cover and is a prescription.  Pharmacy can take verbal if for Niacin ER  10/23/17.

## 2017-10-23 NOTE — Telephone Encounter (Signed)
Rx sent 

## 2017-11-11 NOTE — Progress Notes (Signed)
Closing out lab/order note open since:  08/11/16

## 2017-11-11 NOTE — Telephone Encounter (Signed)
Patient has been seen Closing out old Mychart note

## 2017-11-11 NOTE — Telephone Encounter (Signed)
Signing off on old [place] note Health maintenance tab shows that she did in fact receive her flu shot

## 2017-11-13 ENCOUNTER — Other Ambulatory Visit: Payer: Self-pay | Admitting: Family Medicine

## 2017-11-13 MED ORDER — NIACINAMIDE 500 MG PO TABS: 500.0000 mg | ORAL_TABLET | Freq: Two times a day (BID) | ORAL | 1 refills | Status: DC

## 2017-11-13 NOTE — Progress Notes (Signed)
I spoke with patient; she wishes to stay on the tablets That's what [facility] mailed her anyway She'll stay with what they sent her Form ready to send to [facility]

## 2017-11-22 ENCOUNTER — Telehealth: Payer: Self-pay | Admitting: Family Medicine

## 2017-11-22 NOTE — Telephone Encounter (Signed)
Copied from [place]. Topic: Quick Communication - See Telephone Encounter >> Nov 22, 2017  4:26 PM [place], Laban Emperor wrote: CRM for notification. See Telephone encounter for: Melissa with [facility] states she needs to verify the pts niacinamide 500 MG tablet [[id number]].  Contact number [phone]  11/22/17.

## 2017-11-23 NOTE — Telephone Encounter (Signed)
Left a Detailed voicemail with Melissa from pharmacy.

## 2017-12-12 ENCOUNTER — Encounter: Payer: Self-pay | Admitting: Family Medicine

## 2018-01-09 DIAGNOSIS — Z08 Encounter for follow-up examination after completed treatment for malignant neoplasm: Secondary | ICD-10-CM | POA: Diagnosis not present

## 2018-01-09 DIAGNOSIS — D2271 Melanocytic nevi of right lower limb, including hip: Secondary | ICD-10-CM | POA: Diagnosis not present

## 2018-01-09 DIAGNOSIS — B351 Tinea unguium: Secondary | ICD-10-CM | POA: Diagnosis not present

## 2018-01-09 DIAGNOSIS — D2272 Melanocytic nevi of left lower limb, including hip: Secondary | ICD-10-CM | POA: Diagnosis not present

## 2018-01-09 DIAGNOSIS — D2262 Melanocytic nevi of left upper limb, including shoulder: Secondary | ICD-10-CM | POA: Diagnosis not present

## 2018-01-09 DIAGNOSIS — D225 Melanocytic nevi of trunk: Secondary | ICD-10-CM | POA: Diagnosis not present

## 2018-01-09 DIAGNOSIS — L821 Other seborrheic keratosis: Secondary | ICD-10-CM | POA: Diagnosis not present

## 2018-01-09 DIAGNOSIS — Z85828 Personal history of other malignant neoplasm of skin: Secondary | ICD-10-CM | POA: Diagnosis not present

## 2018-01-09 DIAGNOSIS — D2261 Melanocytic nevi of right upper limb, including shoulder: Secondary | ICD-10-CM | POA: Diagnosis not present

## 2018-02-12 DIAGNOSIS — N183 Chronic kidney disease, stage 3 (moderate): Secondary | ICD-10-CM | POA: Diagnosis not present

## 2018-02-12 DIAGNOSIS — I129 Hypertensive chronic kidney disease with stage 1 through stage 4 chronic kidney disease, or unspecified chronic kidney disease: Secondary | ICD-10-CM | POA: Diagnosis not present

## 2018-02-12 DIAGNOSIS — R601 Generalized edema: Secondary | ICD-10-CM | POA: Diagnosis not present

## 2018-02-12 DIAGNOSIS — N2581 Secondary hyperparathyroidism of renal origin: Secondary | ICD-10-CM | POA: Diagnosis not present

## 2018-02-12 DIAGNOSIS — M109 Gout, unspecified: Secondary | ICD-10-CM | POA: Diagnosis not present

## 2018-02-25 ENCOUNTER — Encounter: Payer: Self-pay | Admitting: Family Medicine

## 2018-03-02 ENCOUNTER — Ambulatory Visit (INDEPENDENT_AMBULATORY_CARE_PROVIDER_SITE_OTHER): Payer: PPO

## 2018-03-02 VITALS — BP 124/68 | HR 76 | Temp 97.8°F | Resp 12 | Ht 64.0 in | Wt 161.7 lb

## 2018-03-02 DIAGNOSIS — Z0000 Encounter for general adult medical examination without abnormal findings: Secondary | ICD-10-CM | POA: Diagnosis not present

## 2018-03-02 NOTE — Progress Notes (Signed)
Subjective:   Caitlin Jenkins is a 84 y.o. female who presents for an Initial Medicare Annual Wellness Visit.  Review of Systems    N/A  Cardiac Risk Factors include: dyslipidemia;hypertension;advanced age (>87men, >54 women);sedentary lifestyle     Objective:    Today's Vitals   03/02/18 1040  BP: 124/68  Pulse: 76  Resp: 12  Temp: 97.8 F (36.6 C)  TempSrc: Oral  SpO2: 97%  Weight: 161 lb 11.2 oz (73.3 kg)  Height: 5\' 4"  (1.626 m)   Body mass index is 27.76 kg/m.  Advanced Directives 03/02/2018 04/04/2017 12/14/2016 10/04/2016 05/24/2016 03/10/2015  Does Patient Have a Medical Advance Directive? Yes Yes Yes Yes Yes Yes  Type of Paramedic of [place];Living will [place];Living will - - Living will [place] in Chart? No - copy requested - - - No - copy requested No - copy requested    Current Medications (verified) Outpatient Encounter Medications as of 03/02/2018  Medication Sig  . acetaminophen (RA ACETAMINOPHEN) 650 MG CR tablet Take 650 mg by mouth every 12 (twelve) hours as needed.   [name] amLODipine (NORVASC) 5 MG tablet Take 1 tablet (5 mg total) by mouth daily.  [name] aspirin EC 81 MG tablet Take 81 mg by mouth daily.   [name] atorvastatin (LIPITOR) 10 MG tablet Take 1 tablet (10 mg total) by mouth at bedtime.  [name] BIOTIN PO Take 250 mg by mouth 2 (two) times daily.   . Cholecalciferol (VITAMIN D-1000 MAX ST) 1000 UNITS tablet Take 1,000 Units by mouth daily.   [name] COLACE 100 MG capsule 100 mg daily.  . colchicine 0.6 MG tablet Take two pills at onset of gout flare, followed by one more one hour later, max of three pills per flare.  . levothyroxine (SYNTHROID, LEVOTHROID) 100 MCG tablet Take 1 tablet (100 mcg total) by mouth daily.  [name] losartan (COZAAR) 100 MG tablet Take 1 tablet (100 mg total) by mouth daily. Avoid salt substitutes, no fruit smoothies  . meclizine (ANTIVERT) 25 MG  tablet Take 1 tablet (25 mg total) by mouth 3 (three) times daily as needed.  . Multiple Vitamins-Minerals (SENIOR MULTIVITAMIN PLUS PO) Take 1 tablet by mouth daily.   . niacinamide 500 MG tablet Take 1 tablet (500 mg total) by mouth 2 (two) times daily with a meal.  . Turmeric 450 MG CAPS Take 450 mg by mouth 2 (two) times daily.  . ursodiol (ACTIGALL) 300 MG capsule Take 300 mg by mouth daily.   . vitamin B-12 (CYANOCOBALAMIN) 250 MCG tablet Take 250 mcg by mouth daily.   No facility-administered encounter medications on file as of 03/02/2018.     Allergies (verified) Indomethacin; Ace inhibitors; Augmentin [amoxicillin-pot clavulanate]; Penicillins; and Sulfur   History: Past Medical History:  Diagnosis Date  . Biliary cirrhosis ([place])   . Cancer of skin of leg   . CKD (chronic kidney disease) stage 3, GFR 30-59 ml/min (HCC)   . Gout   . Hiatal hernia Oct 2015   8.5cm  . History of diverticulitis 2000  . Hyperlipidemia   . Hypertension   . Hypothyroidism   . Skin cancer of nose    Past Surgical History:  Procedure Laterality Date  . ABDOMINAL HYSTERECTOMY  1995  . CATARACT EXTRACTION    . FOOT SURGERY  2000  . HEMORRHOID SURGERY    . THYROID SURGERY  1999   para thyroid  Family History  Problem Relation Age of Onset  . Emphysema Father   . Asthma Father   . Hypertension Daughter   . Heart disease Daughter        3 stents  . Diabetes Daughter   . Hypertension Son   . Cancer Son        prostate  . Diabetes Son   . Heart disease Daughter        heart attack, 2 stents  . Multiple sclerosis Daughter   . Diabetes Daughter   . Fibromyalgia Daughter   . Diabetes Daughter    Social History   Socioeconomic History  . Marital status: Widowed    Spouse name: Joe  . Number of children: 4  . Years of education: some college  . Highest education level: 12th grade  Occupational History  . Occupation: Retired  Scientific laboratory technician  . Financial resource strain: Not hard at  all  . Food insecurity:    Worry: Never true    Inability: Never true  . Transportation needs:    Medical: No    Non-medical: No  Tobacco Use  . Smoking status: Never Smoker  . Smokeless tobacco: Never Used  . Tobacco comment: smoking cessation materials not required  Substance and Sexual Activity  . Alcohol use: No  . Drug use: No  . Sexual activity: Not Currently  Lifestyle  . Physical activity:    Days per week: 0 days    Minutes per session: 0 min  . Stress: Not at all  Relationships  . Social connections:    Talks on phone: Patient refused    Gets together: Patient refused    Attends religious service: Patient refused    Active member of club or organization: Patient refused    Attends meetings of clubs or organizations: Patient refused    Relationship status: Widowed  Other Topics Concern  . Not on file  Social History Narrative  . Not on file    Tobacco Counseling Counseling given: No Comment: smoking cessation materials not required  Clinical Intake:  Pre-visit preparation completed: Yes  Pain : No/denies pain   BMI - recorded: 27.76 Nutritional Status: BMI 25 -29 Overweight Nutritional Risks: None Diabetes: No  How often do you need to have someone help you when you read instructions, pamphlets, or other written materials from your doctor or pharmacy?: 1 - Never  Interpreter Needed?: No  Information entered by :: AE[place], LPN   Activities of Daily Living In your present state of health, do you have any difficulty performing the following activities: 03/02/2018 08/29/2017  Hearing? N N  Comment B hearing aids -  Vision? N N  Comment denies eyeglasses -  Difficulty concentrating or making decisions? Y N  Comment short term memory loss -  Walking or climbing stairs? Y N  Comment dyspnea and joint pain -  Dressing or bathing? N N  Doing errands, shopping? N N  Preparing Food and eating ? N -  Comment denies dentures -  Using the Toilet? N -    In the past six months, have you accidently leaked urine? Y -  Comment stress incontinence -  Do you have problems with loss of bowel control? N -  Managing your Medications? N -  Managing your Finances? N -  Housekeeping or managing your Housekeeping? N -  Some recent data might be hidden     Immunizations and Health Maintenance Immunization History  Administered Date(s) Administered  . Influenza, High  Dose Seasonal PF 05/24/2016, 08/02/2017  . Influenza, Seasonal, Injecte, Preservative Fre 07/22/2013, 07/15/2014  . Influenza,inj,Quad PF,6+ Mos 06/19/2015  . Influenza-Unspecified 07/18/2012, 07/21/2014, 08/02/2017  . Pneumococcal Conjugate-13 05/16/2014  . Pneumococcal Polysaccharide-23 09/26/2013  . Zoster 09/26/2010   There are no preventive care reminders to display for this patient.  Patient Care Team: Lada, Satira Anis, MD as PCP - General (Family Medicine) Dasher, Rayvon Char, MD (Dermatology) Lavonia Dana, MD as Consulting Physician (Internal Medicine) Marval Regal, NP as Nurse Practitioner (Nurse Practitioner)  Indicate any recent Medical Services you may have received from other than [facility] providers in the past year (date may be approximate).     Assessment:   This is a routine wellness examination for Caitlin Jenkins.  Hearing/Vision screen Vision Screening Comments: Sees Dr. Ellin Mayhew for annual eye exams  Dietary issues and exercise activities discussed: Current Exercise Habits: The patient does not participate in regular exercise at present, Exercise limited by: Other - see comments(Gout)  Goals    . DIET - INCREASE WATER INTAKE     Recommend to drink at least 6-8 8oz glasses of water per day.      Depression Screen PHQ 2/9 Scores 03/02/2018 04/04/2017 12/14/2016 10/04/2016 05/24/2016 03/13/2015  PHQ - 2 Score 0 0 0 0 0 0  PHQ- 9 Score 0 - - - - -    Fall Risk Fall Risk  03/02/2018 08/29/2017 04/04/2017 12/14/2016 10/04/2016  Falls in the past year? No No No No No  Risk  for fall due to : History of fall(s);Impaired balance/gait - - - -  Risk for fall due to: Comment tends to trip a lot - - - -    [place]: Is your home free of loose throw rugs in walkways, pet beds, electrical cords, etc? Yes Is there adequate lighting in your home to reduce risk of falls?  Yes Are there stairs in or around your home WITH handrails? Yes  ASSISTIVE DEVICES UTILIZED TO PREVENT FALLS: Use of a cane, walker or w/c? No Grab bars in the bathroom? Yes  Shower chair or a place to sit while bathing? Yes An elevated toilet seat or a handicapped toilet? Yes  Timed Get Up and Go Performed: Yes. Pt ambulated 10 feet within 7 sec. Gait stead-fast and without the use of an assistive device. No intervention required at this time. Fall risk prevention has been discussed.  Community Resource Referral:  [place] Referral not required at this time.   Cognitive Function:     6CIT Screen 03/02/2018  What Year? 0 points  What month? 0 points  What time? 0 points  Count back from 20 0 points  Months in reverse 0 points  Repeat phrase 0 points  Total Score 0    Screening Tests Health Maintenance  Topic Date Due  . TETANUS/TDAP  03/03/2019 (Originally 10/03/1950)  . INFLUENZA VACCINE  04/26/2018  . PNA vac Low Risk Adult  Completed  . DEXA SCAN  Addressed    Qualifies for Shingles Vaccine? Yes. Zostavax completed 09/26/10. Due for Shingrix. Education has been provided regarding the importance of this vaccine. Pt has been advised to call her insurance company to determine her out of pocket expense. Advised she may also receive this vaccine at her local pharmacy or Health Dept. Verbalized acceptance and understanding.  Due for Tdap vaccine. Education has been provided regarding the importance of this vaccine. Pt has been advised she may receive this vaccine at her local  pharmacy or Health Dept. Also advised to provide a copy of her vaccination  record if she chooses to receive this vaccine at her local pharmacy. Verbalized acceptance and understanding.  Cancer Screenings: Lung: Low Dose CT Chest recommended if Age 49-80 years, 30 pack-year currently smoking OR have quit w/in 15years. Patient does not qualify. Breast Screening: No longer required  Bone Density/Dexa: No longer required Colorectal: No longer required  Additional Screenings: Hepatitis C Screening: Does not qualify   Plan:  I have personally reviewed and addressed the Medicare Annual Wellness questionnaire and have noted the following in the patient's chart:  A. Medical and social history B. Use of alcohol, tobacco or illicit drugs  C. Current medications and supplements D. Functional ability and status E.  Nutritional status F.  Physical activity G. Advance directives H. List of other physicians I.  Hospitalizations, surgeries, and ER visits in previous 12 months J.  [place] such as hearing and vision if needed, cognitive and depression L. Referrals and appointments  In addition, I have reviewed and discussed with patient certain preventive protocols, quality metrics, and best practice recommendations. A written personalized care plan for preventive services as well as general preventive health recommendations were provided to patient.  See attached scanned questionnaire for additional information.   Signed,  Aleatha Borer, LPN Nurse Health Advisor

## 2018-03-02 NOTE — Patient Instructions (Signed)
Caitlin Jenkins , Thank you for taking time to come for your Medicare Wellness Visit. I appreciate your ongoing commitment to your health goals. Please review the following plan we discussed and let me know if I can assist you in the future.   Screening recommendations/referrals: Colorectal Screening: No longer required Mammogram: No longer required Bone Density: No longer required Lung Cancer Screening: You do not qualify for this screening Hepatitis C Screening: You do not qualify for this screening  Vision and Dental Exams: Recommended annual ophthalmology exams for early detection of glaucoma and other disorders of the eye Recommended annual dental exams for proper oral hygiene  Vaccinations: Influenza vaccine: Up to date  Pneumococcal vaccine: Up to date Tdap vaccine: Declined. Please call your insurance company to determine your out of pocket expense. You may also receive this vaccine at your local pharmacy or Health Dept. Shingles vaccine: Please call your insurance company to determine your out of pocket expense for the Shingrix vaccine. You may also receive this vaccine at your local pharmacy or Health Dept.  Advanced directives: Please bring a copy of your POA (Power of Attorney) and/or Living Will to your next appointment.  Conditions/risks identified: Recommend to drink at least 6-8 8oz glasses of water per day.  Next appointment: Please schedule your Annual Wellness Visit with your Nurse Health Advisor in one year.  Preventive Care 82 Years and Older, Female Preventive care refers to lifestyle choices and visits with your health care provider that can promote health and wellness. What does preventive care include?  A yearly physical exam. This is also called an annual well check.  Dental exams once or twice a year.  Routine eye exams. Ask your health care provider how often you should have your eyes checked.  Personal lifestyle choices, including:  Daily care of your  teeth and gums.  Regular physical activity.  Eating a healthy diet.  Avoiding tobacco and drug use.  Limiting alcohol use.  Practicing safe sex.  Taking low-dose aspirin every day.  Taking vitamin and mineral supplements as recommended by your health care provider. What happens during an annual well check? The services and screenings done by your health care provider during your annual well check will depend on your age, overall health, lifestyle risk factors, and family history of disease. Counseling  Your health care provider may ask you questions about your:  Alcohol use.  Tobacco use.  Drug use.  Emotional well-being.  Home and relationship well-being.  Sexual activity.  Eating habits.  History of falls.  Memory and ability to understand (cognition).  Work and work Statistician.  Reproductive health. Screening  You may have the following tests or measurements:  Height, weight, and BMI.  Blood pressure.  Lipid and cholesterol levels. These may be checked every 5 years, or more frequently if you are over 74 years old.  Skin check.  Lung cancer screening. You may have this screening every year starting at age 53 if you have a 30-pack-year history of smoking and currently smoke or have quit within the past 15 years.  Fecal occult blood test (FOBT) of the stool. You may have this test every year starting at age 35.  Flexible sigmoidoscopy or colonoscopy. You may have a sigmoidoscopy every 5 years or a colonoscopy every 10 years starting at age 77.  Hepatitis C blood test.  Hepatitis B blood test.  Sexually transmitted disease (STD) testing.  Diabetes screening. This is done by checking your blood sugar (glucose) after you  have not eaten for a while (fasting). You may have this done every 1-3 years.  Bone density scan. This is done to screen for osteoporosis. You may have this done starting at age 33.  Mammogram. This may be done every 1-2 years. Talk  to your health care provider about how often you should have regular mammograms. Talk with your health care provider about your test results, treatment options, and if necessary, the need for more tests. Vaccines  Your health care provider may recommend certain vaccines, such as:  Influenza vaccine. This is recommended every year.  Tetanus, diphtheria, and acellular pertussis (Tdap, Td) vaccine. You may need a Td booster every 10 years.  Zoster vaccine. You may need this after age 85.  Pneumococcal 13-valent conjugate (PCV13) vaccine. One dose is recommended after age 49.  Pneumococcal polysaccharide (PPSV23) vaccine. One dose is recommended after age 49. Talk to your health care provider about which screenings and vaccines you need and how often you need them. This information is not intended to replace advice given to you by your health care provider. Make sure you discuss any questions you have with your health care provider. Document Released: 10/09/2015 Document Revised: 06/01/2016 Document Reviewed: 07/14/2015 Elsevier Interactive Patient Education  2017 [place] Prevention in the Home Falls can cause injuries. They can happen to people of all ages. There are many things you can do to make your home safe and to help prevent falls. What can I do on the outside of my home?  Regularly fix the edges of walkways and driveways and fix any cracks.  Remove anything that might make you trip as you walk through a door, such as a raised step or threshold.  Trim any bushes or trees on the path to your home.  Use bright outdoor lighting.  Clear any walking paths of anything that might make someone trip, such as rocks or tools.  Regularly check to see if handrails are loose or broken. Make sure that both sides of any steps have handrails.  Any raised decks and porches should have guardrails on the edges.  Have any leaves, snow, or ice cleared regularly.  Use sand or salt on  walking paths during winter.  Clean up any spills in your garage right away. This includes oil or grease spills. What can I do in the bathroom?  Use night lights.  Install grab bars by the toilet and in the tub and shower. Do not use towel bars as grab bars.  Use non-skid mats or decals in the tub or shower.  If you need to sit down in the shower, use a plastic, non-slip stool.  Keep the floor dry. Clean up any water that spills on the floor as soon as it happens.  Remove soap buildup in the tub or shower regularly.  Attach bath mats securely with double-sided non-slip rug tape.  Do not have throw rugs and other things on the floor that can make you trip. What can I do in the bedroom?  Use night lights.  Make sure that you have a light by your bed that is easy to reach.  Do not use any sheets or blankets that are too big for your bed. They should not hang down onto the floor.  Have a firm chair that has side arms. You can use this for support while you get dressed.  Do not have throw rugs and other things on the floor that can make you trip. What  can I do in the kitchen?  Clean up any spills right away.  Avoid walking on wet floors.  Keep items that you use a lot in easy-to-reach places.  If you need to reach something above you, use a strong step stool that has a grab bar.  Keep electrical cords out of the way.  Do not use floor polish or wax that makes floors slippery. If you must use wax, use non-skid floor wax.  Do not have throw rugs and other things on the floor that can make you trip. What can I do with my stairs?  Do not leave any items on the stairs.  Make sure that there are handrails on both sides of the stairs and use them. Fix handrails that are broken or loose. Make sure that handrails are as long as the stairways.  Check any carpeting to make sure that it is firmly attached to the stairs. Fix any carpet that is loose or worn.  Avoid having throw  rugs at the top or bottom of the stairs. If you do have throw rugs, attach them to the floor with carpet tape.  Make sure that you have a light switch at the top of the stairs and the bottom of the stairs. If you do not have them, ask someone to add them for you. What else can I do to help prevent falls?  Wear shoes that:  Do not have high heels.  Have rubber bottoms.  Are comfortable and fit you well.  Are closed at the toe. Do not wear sandals.  If you use a stepladder:  Make sure that it is fully opened. Do not climb a closed stepladder.  Make sure that both sides of the stepladder are locked into place.  Ask someone to hold it for you, if possible.  Clearly mark and make sure that you can see:  Any grab bars or handrails.  First and last steps.  Where the edge of each step is.  Use tools that help you move around (mobility aids) if they are needed. These include:  Canes.  Walkers.  Scooters.  Crutches.  Turn on the lights when you go into a dark area. Replace any light bulbs as soon as they burn out.  Set up your furniture so you have a clear path. Avoid moving your furniture around.  If any of your floors are uneven, fix them.  If there are any pets around you, be aware of where they are.  Review your medicines with your doctor. Some medicines can make you feel dizzy. This can increase your chance of falling. Ask your doctor what other things that you can do to help prevent falls. This information is not intended to replace advice given to you by your health care provider. Make sure you discuss any questions you have with your health care provider. Document Released: 07/09/2009 Document Revised: 02/18/2016 Document Reviewed: 10/17/2014 Elsevier Interactive Patient Education  2017 [employer].

## 2018-03-08 DIAGNOSIS — K743 Primary biliary cirrhosis: Secondary | ICD-10-CM | POA: Diagnosis not present

## 2018-03-30 ENCOUNTER — Encounter: Payer: Self-pay | Admitting: Family Medicine

## 2018-03-30 DIAGNOSIS — M1A0610 Idiopathic chronic gout, right knee, without tophus (tophi): Secondary | ICD-10-CM

## 2018-03-30 DIAGNOSIS — E782 Mixed hyperlipidemia: Secondary | ICD-10-CM

## 2018-03-30 DIAGNOSIS — N183 Chronic kidney disease, stage 3 unspecified: Secondary | ICD-10-CM

## 2018-03-30 DIAGNOSIS — K743 Primary biliary cirrhosis: Secondary | ICD-10-CM

## 2018-03-30 DIAGNOSIS — Z5181 Encounter for therapeutic drug level monitoring: Secondary | ICD-10-CM

## 2018-04-02 DIAGNOSIS — M1A0610 Idiopathic chronic gout, right knee, without tophus (tophi): Secondary | ICD-10-CM | POA: Diagnosis not present

## 2018-04-02 DIAGNOSIS — K743 Primary biliary cirrhosis: Secondary | ICD-10-CM | POA: Diagnosis not present

## 2018-04-02 DIAGNOSIS — E782 Mixed hyperlipidemia: Secondary | ICD-10-CM | POA: Diagnosis not present

## 2018-04-02 DIAGNOSIS — Z5181 Encounter for therapeutic drug level monitoring: Secondary | ICD-10-CM | POA: Diagnosis not present

## 2018-04-02 LAB — COMPLETE METABOLIC PANEL WITH GFR
AG Ratio: 1.5 (calc) (ref 1.0–2.5)
ALBUMIN MSPROF: 4.6 g/dL (ref 3.6–5.1)
ALKALINE PHOSPHATASE (APISO): 109 U/L (ref 33–130)
ALT: 12 U/L (ref 6–29)
AST: 28 U/L (ref 10–35)
BILIRUBIN TOTAL: 0.6 mg/dL (ref 0.2–1.2)
BUN / CREAT RATIO: 20 (calc) (ref 6–22)
BUN: 30 mg/dL — AB (ref 7–25)
CO2: 25 mmol/L (ref 20–32)
CREATININE: 1.49 mg/dL — AB (ref 0.60–0.88)
Calcium: 10.3 mg/dL (ref 8.6–10.4)
Chloride: 99 mmol/L (ref 98–110)
GFR, EST AFRICAN AMERICAN: 37 mL/min/{1.73_m2} — AB (ref >=60)
GFR, Est Non African American: 32 mL/min/{1.73_m2} — ABNORMAL LOW (ref >=60)
GLOBULIN: 3.1 g/dL (ref 1.9–3.7)
Glucose, Bld: 133 mg/dL (ref 65–139)
Potassium: 4.7 mmol/L (ref 3.5–5.3)
SODIUM: 135 mmol/L (ref 135–146)
Total Protein: 7.7 g/dL (ref 6.1–8.1)

## 2018-04-02 LAB — URIC ACID: Uric Acid, Serum: 8 mg/dL — ABNORMAL HIGH (ref 2.5–7.0)

## 2018-04-02 LAB — LIPID PANEL
CHOL/HDL RATIO: 2.5 (calc) (ref <5.0)
CHOLESTEROL: 170 mg/dL (ref <200)
HDL: 68 mg/dL (ref >50)
LDL CHOLESTEROL (CALC): 82 mg/dL
Non-HDL Cholesterol (Calc): 102 mg/dL (calc) (ref <130)
Triglycerides: 100 mg/dL (ref <150)

## 2018-04-04 ENCOUNTER — Encounter: Payer: Self-pay | Admitting: Family Medicine

## 2018-04-04 ENCOUNTER — Ambulatory Visit (INDEPENDENT_AMBULATORY_CARE_PROVIDER_SITE_OTHER): Payer: PPO | Admitting: Family Medicine

## 2018-04-04 VITALS — BP 122/62 | HR 91 | Temp 97.9°F | Resp 12 | Ht 64.0 in | Wt 161.2 lb

## 2018-04-04 DIAGNOSIS — N183 Chronic kidney disease, stage 3 unspecified: Secondary | ICD-10-CM

## 2018-04-04 DIAGNOSIS — R1032 Left lower quadrant pain: Secondary | ICD-10-CM

## 2018-04-04 DIAGNOSIS — I10 Essential (primary) hypertension: Secondary | ICD-10-CM | POA: Diagnosis not present

## 2018-04-04 DIAGNOSIS — M1A0610 Idiopathic chronic gout, right knee, without tophus (tophi): Secondary | ICD-10-CM | POA: Diagnosis not present

## 2018-04-04 DIAGNOSIS — R1031 Right lower quadrant pain: Secondary | ICD-10-CM

## 2018-04-04 DIAGNOSIS — L602 Onychogryphosis: Secondary | ICD-10-CM | POA: Diagnosis not present

## 2018-04-04 DIAGNOSIS — E782 Mixed hyperlipidemia: Secondary | ICD-10-CM | POA: Diagnosis not present

## 2018-04-04 MED ORDER — FEBUXOSTAT 40 MG PO TABS
40.0000 mg | ORAL_TABLET | Freq: Every day | ORAL | 3 refills | Status: DC
Start: 2018-04-04 — End: 2018-05-11

## 2018-04-04 NOTE — Patient Instructions (Addendum)
Let's get urine today Start the uloric Return for labs two weeks after your start the new gout medicine Return the stool cards when you are finished Try Vicks Vap-O-Rub on the affected nails Call me for the Penlac after your cruise if needed Try more hydration Increase fiber in the diet  High-Fiber Diet Fiber, also called dietary fiber, is a type of carbohydrate found in fruits, vegetables, whole grains, and beans. A high-fiber diet can have many health benefits. Your health care provider may recommend a high-fiber diet to help:  Prevent constipation. Fiber can make your bowel movements more regular.  Lower your cholesterol.  Relieve hemorrhoids, uncomplicated diverticulosis, or irritable bowel syndrome.  Prevent overeating as part of a weight-loss plan.  Prevent heart disease, type 2 diabetes, and certain cancers.  What is my plan? The recommended daily intake of fiber includes:  38 grams for men under age 50.  44 grams for men over age 90.  92 grams for women under age 20.  6 grams for women over age 72.  You can get the recommended daily intake of dietary fiber by eating a variety of fruits, vegetables, grains, and beans. Your health care provider may also recommend a fiber supplement if it is not possible to get enough fiber through your diet. What do I need to know about a high-fiber diet?  Fiber supplements have not been widely studied for their effectiveness, so it is better to get fiber through food sources.  Always check the fiber content on thenutrition facts label of any prepackaged food. Look for foods that contain at least 5 grams of fiber per serving.  Ask your dietitian if you have questions about specific foods that are related to your condition, especially if those foods are not listed in the following section.  Increase your daily fiber consumption gradually. Increasing your intake of dietary fiber too quickly may cause bloating, cramping, or  gas.  Drink plenty of water. Water helps you to digest fiber. What foods can I eat? Grains Whole-grain breads. Multigrain cereal. Oats and oatmeal. Brown rice. Barley. Bulgur wheat. [place]. Bran muffins. Popcorn. Rye wafer crackers. Vegetables Sweet potatoes. Spinach. Kale. Artichokes. Cabbage. Broccoli. Green peas. Carrots. Squash. Fruits Berries. Pears. Apples. Oranges. Avocados. Prunes and raisins. Dried figs. Meats and Other Protein Sources Navy, kidney, pinto, and soy beans. Split peas. Lentils. Nuts and seeds. Dairy Fiber-fortified yogurt. Beverages Fiber-fortified soy milk. Fiber-fortified orange juice. Other Fiber bars. The items listed above may not be a complete list of recommended foods or beverages. Contact your dietitian for more options. What foods are not recommended? Grains White bread. Pasta made with refined flour. White rice. Vegetables Fried potatoes. Canned vegetables. Well-cooked vegetables. Fruits Fruit juice. Cooked, strained fruit. Meats and Other Protein Sources Fatty cuts of meat. Fried Sales executive or fried fish. Dairy Milk. Yogurt. Cream cheese. Sour cream. Beverages Soft drinks. Other Cakes and pastries. Butter and oils. The items listed above may not be a complete list of foods and beverages to avoid. Contact your dietitian for more information. What are some tips for including high-fiber foods in my diet?  Eat a wide variety of high-fiber foods.  Make sure that half of all grains consumed each day are whole grains.  Replace breads and cereals made from refined flour or white flour with whole-grain breads and cereals.  Replace white rice with brown rice, bulgur wheat, or millet.  Start the day with a breakfast that is high in fiber, such as a cereal that contains  at least 5 grams of fiber per serving.  Use beans in place of meat in soups, salads, or pasta.  Eat high-fiber snacks, such as berries, raw vegetables, nuts, or popcorn. This  information is not intended to replace advice given to you by your health care provider. Make sure you discuss any questions you have with your health care provider. Document Released: 09/12/2005 Document Revised: 02/18/2016 Document Reviewed: 02/25/2014 Elsevier Interactive Patient Education  [employer].

## 2018-04-04 NOTE — Assessment & Plan Note (Signed)
Well-controlled with ARB and CCB

## 2018-04-04 NOTE — Assessment & Plan Note (Signed)
Elevated uric acid; she will continue tart cherries; start uloric; will not use allopurinol b/c of renal function; start uloric and then return in 2 weeks for recheck BMP and uric acid

## 2018-04-04 NOTE — Assessment & Plan Note (Signed)
Continue statin at current dose; tolerating well

## 2018-04-04 NOTE — Assessment & Plan Note (Signed)
Patient will continue to see nephrologist; cannot use allopurinol b/c of GFR; will use uloric for gout and have her return 2 weeks after starting medicine to recheck Cr, GFR, and uric acid; encouraged more hydration

## 2018-04-04 NOTE — Progress Notes (Signed)
BP 122/62   Pulse 91   Temp 97.9 F (36.6 C) (Oral)   Resp 12   Ht 5\' 4"  (1.626 m)   Wt 161 lb 3.2 oz (73.1 kg)   SpO2 93%   BMI 27.67 kg/m    Subjective:    Patient ID: Milagros Loll, female    DOB: Jun 07, 1932, 84 y.o.   MRN: [id number]  HPI: JENNIFIER SMITHERMAN is a 84 y.o. female  Chief Complaint  Patient presents with  . Follow-up  . Nail Problem    wants to talk about a medication for that    HPI  Patient is here for f/u; she had labs drawn a few days ago and is here to review those  Gout; had a flare last week; no known trigger Doing tart cherries Uric acid is up to 8  CKD stage 3, but GFR has dropped to 32; stays cold much of the time; not drinking water unless hot and working in the yard; does not sweat much, hardly ever annot use allopurinol Colchicine just when needed  High cholesterol, gorgeous now; reviewed with her; she is taking lipitor  Glucose was NON-fasting when drawn earlier this week  Liver enzymes are normal; she has biliary cirrhosis (primary biliary cholangitis) with cirrhosis; she really does not want any more liver scans; she says what is she going to do at her age  Everything in the inside hurts for five minutes when she first gets up; then after 5 minutes, she feels fine; she sleeps good; 9-10 hours a night; not sure if constipation; just kind of sore; goes away; doesn't want to worry about it, just thught she would mention it; no blood in stool or urine; urine does not have a strong odor in the morning, just real yellow  She has toenail fungus great and middle toes both feet and left ring finger; she is going on a cruise in September; would like to treat when she gets back; tree oil has been tried and that has helped some  HTN; taking losartan and amlodipine; well-controlled  Depression screen [facility] 2/9 04/04/2018 03/02/2018 04/04/2017 12/14/2016 10/04/2016  Decreased Interest 0 0 0 0 0  Down, Depressed, Hopeless 0 0 0 0 0  PHQ - 2 Score 0 0  0 0 0  Altered sleeping - 0 - - -  Tired, decreased energy - 0 - - -  Change in appetite - 0 - - -  Feeling bad or failure about yourself  - 0 - - -  Trouble concentrating - 0 - - -  Moving slowly or fidgety/restless - 0 - - -  Suicidal thoughts - 0 - - -  PHQ-9 Score - 0 - - -  Difficult doing work/chores - Not difficult at all - - -    Relevant past medical, surgical, family and social history reviewed Past Medical History:  Diagnosis Date  . Biliary cirrhosis ([place])   . Cancer of skin of leg   . CKD (chronic kidney disease) stage 3, GFR 30-59 ml/min (HCC)   . Gout   . Hiatal hernia Oct 2015   8.5cm  . History of diverticulitis 2000  . Hyperlipidemia   . Hypertension   . Hypothyroidism   . Skin cancer of nose    Past Surgical History:  Procedure Laterality Date  . ABDOMINAL HYSTERECTOMY  1995  . CATARACT EXTRACTION    . FOOT SURGERY  2000  . HEMORRHOID SURGERY    . THYROID SURGERY  1999   para thyroid   Family History  Problem Relation Age of Onset  . Emphysema Father   . Asthma Father   . Hypertension Daughter   . Heart disease Daughter        3 stents  . Diabetes Daughter   . Hypertension Son   . Cancer Son        prostate  . Diabetes Son   . Heart disease Daughter        heart attack, 2 stents  . Multiple sclerosis Daughter   . Diabetes Daughter   . Fibromyalgia Daughter   . Diabetes Daughter    Social History   Tobacco Use  . Smoking status: Never Smoker  . Smokeless tobacco: Never Used  . Tobacco comment: smoking cessation materials not required  Substance Use Topics  . Alcohol use: No  . Drug use: No    Interim medical history since last visit reviewed. Allergies and medications reviewed  Review of Systems Per HPI unless specifically indicated above     Objective:    BP 122/62   Pulse 91   Temp 97.9 F (36.6 C) (Oral)   Resp 12   Ht 5\' 4"  (1.626 m)   Wt 161 lb 3.2 oz (73.1 kg)   SpO2 93%   BMI 27.67 kg/m   Wt Readings from  Last 3 Encounters:  04/04/18 161 lb 3.2 oz (73.1 kg)  03/02/18 161 lb 11.2 oz (73.3 kg)  10/05/17 163 lb 11.2 oz (74.3 kg)    Physical Exam  Constitutional: She appears well-developed and well-nourished. No distress.  HENT:  Head: Normocephalic and atraumatic.  Right Ear: Tympanic membrane and ear canal normal.  Left Ear: Tympanic membrane and ear canal normal.  Eyes: EOM are normal. No scleral icterus.  Neck: No thyromegaly present.  Cardiovascular: Normal rate, regular rhythm and normal heart sounds.  No murmur heard. Pulmonary/Chest: Effort normal and breath sounds normal. No respiratory distress. She has no wheezes.  Abdominal: Soft. Bowel sounds are normal. She exhibits no distension and no pulsatile midline mass. There is no tenderness.  Musculoskeletal: Normal range of motion. She exhibits no edema.  Neurological: She is alert. She exhibits normal muscle tone.  Skin: Skin is warm and dry. She is not diaphoretic. No pallor.  Thickened nails on great toes and middle toes of both feet, and ring fingernail LEFT hand  Psychiatric: She has a normal mood and affect. Her behavior is normal. Judgment and thought content normal. Her mood appears not anxious. She does not exhibit a depressed mood.    Results for orders placed or performed in visit on 03/30/18  Uric acid  Result Value Ref Range   Uric Acid, Serum 8.0 (H) 2.5 - 7.0 mg/dL  COMPLETE METABOLIC PANEL WITH GFR  Result Value Ref Range   Glucose, Bld 133 65 - 139 mg/dL   BUN 30 (H) 7 - 25 mg/dL   Creat 1.49 (H) 0.60 - 0.88 mg/dL   GFR, Est Non African American 32 (L) > OR = 60 mL/min/1.99m2   GFR, Est African American 37 (L) > OR = 60 mL/min/1.37m2   BUN/Creatinine Ratio 20 6 - 22 (calc)   Sodium 135 135 - 146 mmol/L   Potassium 4.7 3.5 - 5.3 mmol/L   Chloride 99 98 - 110 mmol/L   CO2 25 20 - 32 mmol/L   Calcium 10.3 8.6 - 10.4 mg/dL   Total Protein 7.7 6.1 - 8.1 g/dL   Albumin 4.6 3.6 -  5.1 g/dL   Globulin 3.1 1.9 -  3.7 g/dL (calc)   AG Ratio 1.5 1.0 - 2.5 (calc)   Total Bilirubin 0.6 0.2 - 1.2 mg/dL   Alkaline phosphatase (APISO) 109 33 - 130 U/L   AST 28 10 - 35 U/L   ALT 12 6 - 29 U/L  Lipid panel  Result Value Ref Range   Cholesterol 170 <200 mg/dL   HDL 68 >50 mg/dL   Triglycerides 100 <150 mg/dL   LDL Cholesterol (Calc) 82 mg/dL (calc)   Total CHOL/HDL Ratio 2.5 <5.0 (calc)   Non-HDL Cholesterol (Calc) 102 <130 mg/dL (calc)      Assessment & Plan:   Problem List Items Addressed This Visit      Cardiovascular and Mediastinum   Essential hypertension, benign (Chronic)    Well-controlled with ARB and CCB        Genitourinary   CKD (chronic kidney disease) stage 3, GFR 30-59 ml/min (HCC) (Chronic)    Patient will continue to see nephrologist; cannot use allopurinol b/c of GFR; will use uloric for gout and have her return 2 weeks after starting medicine to recheck Cr, GFR, and uric acid; encouraged more hydration      Relevant Orders   Basic metabolic panel     Other   Hyperlipidemia (Chronic)    Continue statin at current dose; tolerating well      Gout - Primary (Chronic)    Elevated uric acid; she will continue tart cherries; start uloric; will not use allopurinol b/c of renal function; start uloric and then return in 2 weeks for recheck BMP and uric acid      Relevant Orders   Uric acid    Other Visit Diagnoses    Bilateral lower abdominal discomfort       stool cards given; check urine today; she does not want scans; increase hydration and fiber in diet in case constipation   Relevant Orders   Urinalysis w microscopic + reflex cultur   Nail disorder (onychogryphosis)       with probable fungus great and middle toes both feet, left ring finger; she can try vicks vap-o-rub, then penlac after cruise; would NOT use oral agent       Follow up plan: Return in about 4 months (around 08/05/2018) for follow-up visit with Dr. Sanda Klein.  An after-visit summary was printed and  given to the patient at [facility].  Please see the patient instructions which may contain other information and recommendations beyond what is mentioned above in the assessment and plan.  Meds ordered this encounter  Medications  . febuxostat (ULORIC) 40 MG tablet    Sig: Take 1 tablet (40 mg total) by mouth daily.    Dispense:  90 tablet    Refill:  3    Orders Placed This Encounter  Procedures  . Basic metabolic panel  . Uric acid  . Urinalysis w microscopic + reflex cultur   cc: Dr. Juleen China with labs

## 2018-04-05 ENCOUNTER — Other Ambulatory Visit: Payer: Self-pay | Admitting: Family Medicine

## 2018-04-05 MED ORDER — DOXYCYCLINE HYCLATE 100 MG PO TABS: 100.0000 mg | ORAL_TABLET | Freq: Two times a day (BID) | ORAL | 0 refills | Status: DC

## 2018-04-05 NOTE — Progress Notes (Signed)
Start Rx for probably bladder infection

## 2018-04-06 ENCOUNTER — Encounter: Payer: Self-pay | Admitting: Family Medicine

## 2018-04-06 LAB — CULTURE INDICATED

## 2018-04-06 LAB — URINE CULTURE
MICRO NUMBER: 90822256
SPECIMEN QUALITY:: ADEQUATE

## 2018-04-06 LAB — URINALYSIS W MICROSCOPIC + REFLEX CULTURE
BACTERIA UA: NONE SEEN /HPF
Bilirubin Urine: NEGATIVE
GLUCOSE, UA: NEGATIVE
HYALINE CAST: NONE SEEN /LPF
Hgb urine dipstick: NEGATIVE
Ketones, ur: NEGATIVE
Nitrites, Initial: NEGATIVE
RBC / HPF: NONE SEEN /HPF (ref 0–2)
SPECIFIC GRAVITY, URINE: 1.022 (ref 1.001–1.03)
pH: 7 (ref 5.0–8.0)

## 2018-04-10 ENCOUNTER — Encounter: Payer: Self-pay | Admitting: Family Medicine

## 2018-04-11 ENCOUNTER — Ambulatory Visit: Payer: Self-pay

## 2018-04-11 ENCOUNTER — Ambulatory Visit (INDEPENDENT_AMBULATORY_CARE_PROVIDER_SITE_OTHER): Payer: PPO

## 2018-04-11 DIAGNOSIS — R1031 Right lower quadrant pain: Secondary | ICD-10-CM

## 2018-04-11 DIAGNOSIS — R1032 Left lower quadrant pain: Secondary | ICD-10-CM

## 2018-04-11 LAB — POC HEMOCCULT BLD/STL (HOME/3-CARD/SCREEN)
CARD #2 DATE: 7152019
Card #1 Date: 7142019
Card #2 Fecal Occult Blod, POC: NEGATIVE
Card #3 Date: 7162019
FECAL OCCULT BLD: NEGATIVE
Fecal Occult Blood, POC: NEGATIVE

## 2018-04-16 ENCOUNTER — Encounter: Payer: Self-pay | Admitting: Family Medicine

## 2018-04-17 ENCOUNTER — Encounter: Payer: Self-pay | Admitting: Family Medicine

## 2018-05-11 ENCOUNTER — Other Ambulatory Visit: Payer: Self-pay

## 2018-05-11 ENCOUNTER — Encounter: Payer: Self-pay | Admitting: Family Medicine

## 2018-05-11 ENCOUNTER — Encounter: Payer: Self-pay | Admitting: Emergency Medicine

## 2018-05-11 ENCOUNTER — Encounter: Payer: Self-pay | Admitting: Nurse Practitioner

## 2018-05-11 ENCOUNTER — Inpatient Hospital Stay
Admission: EM | Admit: 2018-05-11 | Discharge: 2018-05-16 | DRG: 872 | Disposition: A | Discharge status: Home Health | Payer: PPO | Attending: Internal Medicine | Admitting: Internal Medicine

## 2018-05-11 ENCOUNTER — Emergency Department: Payer: PPO

## 2018-05-11 ENCOUNTER — Telehealth: Payer: Self-pay | Admitting: Nurse Practitioner

## 2018-05-11 ENCOUNTER — Ambulatory Visit (INDEPENDENT_AMBULATORY_CARE_PROVIDER_SITE_OTHER): Payer: PPO | Admitting: Nurse Practitioner

## 2018-05-11 VITALS — BP 132/70 | HR 93 | Temp 98.5°F | Ht 64.0 in | Wt 159.3 lb

## 2018-05-11 DIAGNOSIS — R918 Other nonspecific abnormal finding of lung field: Secondary | ICD-10-CM | POA: Diagnosis not present

## 2018-05-11 DIAGNOSIS — R509 Fever, unspecified: Secondary | ICD-10-CM | POA: Diagnosis not present

## 2018-05-11 DIAGNOSIS — N3090 Cystitis, unspecified without hematuria: Secondary | ICD-10-CM | POA: Diagnosis not present

## 2018-05-11 DIAGNOSIS — Z85828 Personal history of other malignant neoplasm of skin: Secondary | ICD-10-CM | POA: Diagnosis not present

## 2018-05-11 DIAGNOSIS — I129 Hypertensive chronic kidney disease with stage 1 through stage 4 chronic kidney disease, or unspecified chronic kidney disease: Secondary | ICD-10-CM | POA: Diagnosis present

## 2018-05-11 DIAGNOSIS — N183 Chronic kidney disease, stage 3 unspecified: Secondary | ICD-10-CM

## 2018-05-11 DIAGNOSIS — Z880 Allergy status to penicillin: Secondary | ICD-10-CM | POA: Diagnosis not present

## 2018-05-11 DIAGNOSIS — M1A0610 Idiopathic chronic gout, right knee, without tophus (tophi): Secondary | ICD-10-CM

## 2018-05-11 DIAGNOSIS — Z882 Allergy status to sulfonamides status: Secondary | ICD-10-CM | POA: Diagnosis not present

## 2018-05-11 DIAGNOSIS — Z888 Allergy status to other drugs, medicaments and biological substances status: Secondary | ICD-10-CM | POA: Diagnosis not present

## 2018-05-11 DIAGNOSIS — Z79890 Hormone replacement therapy (postmenopausal): Secondary | ICD-10-CM | POA: Diagnosis not present

## 2018-05-11 DIAGNOSIS — E785 Hyperlipidemia, unspecified: Secondary | ICD-10-CM | POA: Diagnosis present

## 2018-05-11 DIAGNOSIS — Z7982 Long term (current) use of aspirin: Secondary | ICD-10-CM

## 2018-05-11 DIAGNOSIS — J449 Chronic obstructive pulmonary disease, unspecified: Secondary | ICD-10-CM | POA: Diagnosis not present

## 2018-05-11 DIAGNOSIS — E790 Hyperuricemia without signs of inflammatory arthritis and tophaceous disease: Secondary | ICD-10-CM | POA: Diagnosis not present

## 2018-05-11 DIAGNOSIS — K743 Primary biliary cirrhosis: Secondary | ICD-10-CM

## 2018-05-11 DIAGNOSIS — E039 Hypothyroidism, unspecified: Secondary | ICD-10-CM | POA: Diagnosis not present

## 2018-05-11 DIAGNOSIS — M109 Gout, unspecified: Secondary | ICD-10-CM | POA: Diagnosis present

## 2018-05-11 DIAGNOSIS — E860 Dehydration: Secondary | ICD-10-CM | POA: Diagnosis not present

## 2018-05-11 DIAGNOSIS — Z8249 Family history of ischemic heart disease and other diseases of the circulatory system: Secondary | ICD-10-CM

## 2018-05-11 DIAGNOSIS — Z79899 Other long term (current) drug therapy: Secondary | ICD-10-CM | POA: Diagnosis not present

## 2018-05-11 DIAGNOSIS — N390 Urinary tract infection, site not specified: Secondary | ICD-10-CM | POA: Diagnosis present

## 2018-05-11 DIAGNOSIS — K745 Biliary cirrhosis, unspecified: Secondary | ICD-10-CM | POA: Diagnosis present

## 2018-05-11 DIAGNOSIS — A419 Sepsis, unspecified organism: Secondary | ICD-10-CM | POA: Diagnosis not present

## 2018-05-11 DIAGNOSIS — R6883 Chills (without fever): Secondary | ICD-10-CM

## 2018-05-11 DIAGNOSIS — R300 Dysuria: Secondary | ICD-10-CM

## 2018-05-11 DIAGNOSIS — R822 Biliuria: Secondary | ICD-10-CM

## 2018-05-11 DIAGNOSIS — R062 Wheezing: Secondary | ICD-10-CM

## 2018-05-11 LAB — CBC WITH DIFFERENTIAL/PLATELET
BASOS ABS: 0 10*3/uL (ref 0–0.1)
BASOS PCT: 0 %
EOS PCT: 0 %
Eosinophils Absolute: 0 10*3/uL (ref 0–0.7)
HCT: 38.5 % (ref 35.0–47.0)
Hemoglobin: 13.4 g/dL (ref 12.0–16.0)
LYMPHS ABS: 2.6 10*3/uL (ref 1.0–3.6)
Lymphocytes Relative: 11 %
MCH: 31.4 pg (ref 26.0–34.0)
MCHC: 34.7 g/dL (ref 32.0–36.0)
MCV: 90.4 fL (ref 80.0–100.0)
Monocytes Absolute: 1.2 10*3/uL — ABNORMAL HIGH (ref 0.2–0.9)
Monocytes Relative: 5 %
NEUTROS PCT: 84 %
Neutro Abs: 19.3 10*3/uL — ABNORMAL HIGH (ref 1.4–6.5)
PLATELETS: 290 10*3/uL (ref 150–440)
RBC: 4.26 MIL/uL (ref 3.80–5.20)
RDW: 14.4 % (ref 11.5–14.5)
WBC: 23.1 10*3/uL — AB (ref 3.6–11.0)

## 2018-05-11 LAB — COMPREHENSIVE METABOLIC PANEL
ALT: 11 U/L (ref 0–44)
AST: 34 U/L (ref 15–41)
Albumin: 4.3 g/dL (ref 3.5–5.0)
Alkaline Phosphatase: 87 U/L (ref 38–126)
Anion gap: 11 (ref 5–15)
BILIRUBIN TOTAL: 1.2 mg/dL (ref 0.3–1.2)
BUN: 29 mg/dL — ABNORMAL HIGH (ref 8–23)
CHLORIDE: 100 mmol/L (ref 98–111)
CO2: 22 mmol/L (ref 22–32)
CREATININE: 1.52 mg/dL — AB (ref 0.44–1.00)
Calcium: 9.6 mg/dL (ref 8.9–10.3)
GFR, EST AFRICAN AMERICAN: 35 mL/min — AB (ref >60)
GFR, EST NON AFRICAN AMERICAN: 30 mL/min — AB (ref >60)
Glucose, Bld: 96 mg/dL (ref 70–99)
Potassium: 4.4 mmol/L (ref 3.5–5.1)
Sodium: 133 mmol/L — ABNORMAL LOW (ref 135–145)
TOTAL PROTEIN: 8.1 g/dL (ref 6.5–8.1)

## 2018-05-11 LAB — POCT URINALYSIS DIPSTICK
Appearance: ABNORMAL
Glucose, UA: NEGATIVE
KETONES UA: NEGATIVE
Nitrite, UA: POSITIVE
PH UA: 7 (ref 5.0–8.0)
Protein, UA: POSITIVE — AB
Spec Grav, UA: 1.01 (ref 1.010–1.025)
UROBILINOGEN UA: 0.2 U/dL

## 2018-05-11 LAB — LACTIC ACID, PLASMA
LACTIC ACID, VENOUS: 2.1 mmol/L — AB (ref 0.5–1.9)
LACTIC ACID, VENOUS: 1.1 mmol/L (ref 0.5–1.9)

## 2018-05-11 MED ORDER — VITAMIN B-12 100 MCG PO TABS
250.0000 ug | ORAL_TABLET | Freq: Every day | ORAL | Status: DC
  Administered 2018-05-12 – 2018-05-16 (×5): 250 ug via ORAL
  Filled 2018-05-11 (×5): qty 3

## 2018-05-11 MED ORDER — NIACINAMIDE 500 MG PO TABS: 500.0000 mg | ORAL_TABLET | Freq: Two times a day (BID) | ORAL | Status: DC

## 2018-05-11 MED ORDER — SODIUM CHLORIDE 0.9 % IV SOLN
INTRAVENOUS | Status: DC
  Administered 2018-05-11 – 2018-05-14 (×5): via INTRAVENOUS

## 2018-05-11 MED ORDER — COLCHICINE 0.6 MG PO TABS: 0.6000 mg | ORAL_TABLET | Freq: Every day | ORAL | Status: DC | PRN

## 2018-05-11 MED ORDER — ACETAMINOPHEN 500 MG PO TABS
ORAL_TABLET | ORAL | Status: AC
  Filled 2018-05-11: qty 2

## 2018-05-11 MED ORDER — AMLODIPINE BESYLATE 5 MG PO TABS
5.0000 mg | ORAL_TABLET | Freq: Every day | ORAL | Status: DC
  Administered 2018-05-12 – 2018-05-16 (×5): 5 mg via ORAL
  Filled 2018-05-11 (×5): qty 1

## 2018-05-11 MED ORDER — ATORVASTATIN CALCIUM 10 MG PO TABS
10.0000 mg | ORAL_TABLET | Freq: Every day | ORAL | Status: DC
  Administered 2018-05-11 – 2018-05-15 (×5): 10 mg via ORAL
  Filled 2018-05-11 (×5): qty 1

## 2018-05-11 MED ORDER — ADULT MULTIVITAMIN W/MINERALS CH
ORAL_TABLET | Freq: Every day | ORAL | Status: DC
  Administered 2018-05-12 – 2018-05-16 (×5): 1 via ORAL
  Filled 2018-05-11 (×5): qty 1

## 2018-05-11 MED ORDER — URSODIOL 300 MG PO CAPS
300.0000 mg | ORAL_CAPSULE | Freq: Every day | ORAL | Status: DC
  Administered 2018-05-12 – 2018-05-16 (×5): 300 mg via ORAL
  Filled 2018-05-11 (×5): qty 1

## 2018-05-11 MED ORDER — HYDROCODONE-ACETAMINOPHEN 5-325 MG PO TABS: 1.0000 | ORAL_TABLET | ORAL | Status: DC | PRN

## 2018-05-11 MED ORDER — ACETAMINOPHEN 500 MG PO TABS
1000.0000 mg | ORAL_TABLET | Freq: Once | ORAL | Status: AC
  Administered 2018-05-11: 1000 mg via ORAL

## 2018-05-11 MED ORDER — DOCUSATE SODIUM 100 MG PO CAPS
100.0000 mg | ORAL_CAPSULE | Freq: Every day | ORAL | Status: DC
  Administered 2018-05-12 – 2018-05-16 (×5): 100 mg via ORAL
  Filled 2018-05-11 (×5): qty 1

## 2018-05-11 MED ORDER — ONDANSETRON HCL 4 MG PO TABS: 4.0000 mg | ORAL_TABLET | Freq: Four times a day (QID) | ORAL | Status: DC | PRN

## 2018-05-11 MED ORDER — ONDANSETRON HCL 4 MG/2ML IJ SOLN: 4.0000 mg | Freq: Four times a day (QID) | INTRAMUSCULAR | Status: DC | PRN

## 2018-05-11 MED ORDER — SENNOSIDES-DOCUSATE SODIUM 8.6-50 MG PO TABS: 1.0000 | ORAL_TABLET | Freq: Every evening | ORAL | Status: DC | PRN

## 2018-05-11 MED ORDER — LOSARTAN POTASSIUM 50 MG PO TABS
100.0000 mg | ORAL_TABLET | Freq: Every day | ORAL | Status: DC
  Administered 2018-05-12 – 2018-05-16 (×5): 100 mg via ORAL
  Filled 2018-05-11 (×5): qty 2

## 2018-05-11 MED ORDER — VITAMIN D 1000 UNITS PO TABS
1000.0000 [IU] | ORAL_TABLET | Freq: Every day | ORAL | Status: DC
  Administered 2018-05-12 – 2018-05-16 (×5): 1000 [IU] via ORAL
  Filled 2018-05-11 (×9): qty 1

## 2018-05-11 MED ORDER — SODIUM CHLORIDE 0.9 % IV SOLN: 1.0000 g | INTRAVENOUS | Status: DC

## 2018-05-11 MED ORDER — MAGNESIUM GLUCONATE 500 MG PO TABS
500.0000 mg | ORAL_TABLET | Freq: Every day | ORAL | Status: DC
  Administered 2018-05-12 – 2018-05-16 (×5): 500 mg via ORAL
  Filled 2018-05-11 (×5): qty 1

## 2018-05-11 MED ORDER — ACETAMINOPHEN 650 MG RE SUPP: 650.0000 mg | Freq: Four times a day (QID) | RECTAL | Status: DC | PRN

## 2018-05-11 MED ORDER — HEPARIN SODIUM (PORCINE) 5000 UNIT/ML IJ SOLN
5000.0000 [IU] | Freq: Three times a day (TID) | INTRAMUSCULAR | Status: DC
  Administered 2018-05-11 – 2018-05-15 (×9): 5000 [IU] via SUBCUTANEOUS
  Filled 2018-05-11 (×12): qty 1

## 2018-05-11 MED ORDER — ACETAMINOPHEN 325 MG PO TABS
650.0000 mg | ORAL_TABLET | Freq: Four times a day (QID) | ORAL | Status: DC | PRN
  Administered 2018-05-12 – 2018-05-14 (×7): 650 mg via ORAL
  Filled 2018-05-11 (×7): qty 2

## 2018-05-11 MED ORDER — SULFAMETHOXAZOLE-TRIMETHOPRIM 800-160 MG PO TABS: 1.0000 | ORAL_TABLET | Freq: Two times a day (BID) | ORAL | 0 refills | Status: DC

## 2018-05-11 MED ORDER — SODIUM CHLORIDE 0.9 % IV SOLN
1.0000 g | Freq: Once | INTRAVENOUS | Status: AC
  Administered 2018-05-11: 1 g via INTRAVENOUS
  Filled 2018-05-11: qty 10

## 2018-05-11 MED ORDER — SODIUM CHLORIDE 0.9 % IV BOLUS
1000.0000 mL | Freq: Once | INTRAVENOUS | Status: AC
  Administered 2018-05-11: 1000 mL via INTRAVENOUS

## 2018-05-11 MED ORDER — LEVOTHYROXINE SODIUM 100 MCG PO TABS
100.0000 ug | ORAL_TABLET | Freq: Every day | ORAL | Status: DC
  Administered 2018-05-12 – 2018-05-16 (×5): 100 ug via ORAL
  Filled 2018-05-11 (×5): qty 1

## 2018-05-11 MED ORDER — ASPIRIN EC 81 MG PO TBEC
81.0000 mg | DELAYED_RELEASE_TABLET | Freq: Every day | ORAL | Status: DC
  Administered 2018-05-12 – 2018-05-16 (×5): 81 mg via ORAL
  Filled 2018-05-11 (×5): qty 1

## 2018-05-11 NOTE — Telephone Encounter (Signed)
Spoke with patient and she is coming in today at 11:20

## 2018-05-11 NOTE — Progress Notes (Signed)
Advanced care plan. Purpose of the Encounter: CODE STATUS Parties in Attendance: Patient and family Patient's Decision Capacity: Good Subjective/Patient's story: Presented to emergency room for fever, chills Objective/Medical story Has UTI and sepsis needs IV antibiotics and IV fluids Goals of care determination:  Advance care directives goals of care discussed With patient and family want everything done which includes CPR and intubation if the need arises CODE STATUS: Full code Time spent discussing advanced care planning: 16 minutes

## 2018-05-11 NOTE — ED Provider Notes (Signed)
[facility] Emergency Department Provider Note  ____________________________________________  Time seen: Approximately 5:53 PM  I have reviewed the triage vital signs and the nursing notes.   HISTORY  Chief Complaint Fever   HPI Caitlin Jenkins is a 84 y.o. female with a history of biliary cirrhosis, chronic kidney disease stage III, hypertension, hyperlipidemia who presents for evaluation of urinary tract infection and elevated white count.  Patient went to see her primary care doctor today.  She has had 2 to 3 days of mild suprapubic discomfort with urination.  Since last night she has had chills and subjective fever.  At the primary care doctor's office she was found to have a UTI and a white count of 22,000 and he was sent to the emergency room for evaluation.  Patient denies cough or chest pain.  She reports feeling short of breath and just very weak since yesterday evening.  She denies abdominal pain or flank pain.  Patient took 500 mg of Tylenol this morning.  Past Medical History:  Diagnosis Date  . Biliary cirrhosis ([place])   . Cancer of skin of leg   . CKD (chronic kidney disease) stage 3, GFR 30-59 ml/min (HCC)   . Gout   . Hiatal hernia Oct 2015   8.5cm  . History of diverticulitis 2000  . Hyperlipidemia   . Hypertension   . Hypothyroidism   . Skin cancer of nose     Patient Active Problem List   Diagnosis Date Noted  . Sepsis ([place]) 05/11/2018  . Primary biliary cholangitis ([place]) 03/20/2017  . Overweight (BMI 25.0-29.9) 11/29/2015  . Carotid artery disease ([place]) 10/09/2015  . Medication monitoring encounter 10/09/2015  . Nail abnormalities 10/09/2015  . Arthralgia of multiple joints 10/09/2015  . Essential hypertension, benign 06/19/2015  . Malignant neoplasm of right external ear 06/19/2015  . 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 Hyperlipidemia   . Hypothyroidism   . Gout   . CKD (chronic  kidney disease) stage 3, GFR 30-59 ml/min (HCC)   . Hiatal hernia 06/26/2014    Past Surgical History:  Procedure Laterality Date  . ABDOMINAL HYSTERECTOMY  1995  . CATARACT EXTRACTION    . FOOT SURGERY  2000  . HEMORRHOID SURGERY    . THYROID SURGERY  1999   para thyroid    Prior to Admission medications   Medication Sig Start Date End Date Taking? Authorizing Provider  amLODipine (NORVASC) 5 MG tablet Take 1 tablet (5 mg total) by mouth daily. 04/04/17  Yes Lada, Satira Anis, MD  aspirin EC 81 MG tablet Take 81 mg by mouth daily.    Yes [provider]  atorvastatin (LIPITOR) 10 MG tablet Take 1 tablet (10 mg total) by mouth at bedtime. 10/05/17  Yes Lada, Satira Anis, MD  BIOTIN PO Take 250 mg by mouth 2 (two) times daily.    Yes [provider]  Cholecalciferol (VITAMIN D-1000 MAX ST) 1000 UNITS tablet Take 1,000 Units by mouth daily.    Yes [provider]  COLACE 100 MG capsule Take 100 mg by mouth daily.  01/26/15  Yes [provider]  levothyroxine (SYNTHROID, LEVOTHROID) 100 MCG tablet Take 1 tablet (100 mcg total) by mouth daily. 10/05/17  Yes Lada, Satira Anis, MD  losartan (COZAAR) 100 MG tablet Take 1 tablet (100 mg total) by mouth daily. Avoid salt substitutes, no fruit smoothies 10/05/17  Yes Lada, Satira Anis, MD  magnesium gluconate (MAGONATE) 500  MG tablet Take 500 mg by mouth daily.   Yes [provider]  Multiple Vitamins-Minerals (SENIOR MULTIVITAMIN PLUS PO) Take 1 tablet by mouth daily.  01/25/15  Yes [provider]  sulfamethoxazole-trimethoprim (BACTRIM DS,SEPTRA DS) 800-160 MG tablet Take 1 tablet by mouth 2 (two) times daily. 05/11/18  Yes Poulose, Bethel Born, NP  Turmeric 450 MG CAPS Take 450 mg by mouth 2 (two) times daily.   Yes [provider]  ursodiol (ACTIGALL) 300 MG capsule Take 300 mg by mouth daily.  12/16/14  Yes [provider]  vitamin B-12 (CYANOCOBALAMIN) 250 MCG tablet Take 250 mcg by  mouth daily.   Yes [provider]  acetaminophen (RA ACETAMINOPHEN) 650 MG CR tablet Take 650 mg by mouth every 12 (twelve) hours as needed.     [provider]  colchicine 0.6 MG tablet Take two pills at onset of gout flare, followed by one more one hour later, max of three pills per flare. 12/14/16   Arnetha Courser, MD  meclizine (ANTIVERT) 25 MG tablet Take 1 tablet (25 mg total) by mouth 3 (three) times daily as needed. Patient not taking: Reported on 05/11/2018 12/14/16   Arnetha Courser, MD  niacinamide 500 MG tablet Take 1 tablet (500 mg total) by mouth 2 (two) times daily with a meal. Patient not taking: Reported on 05/11/2018 11/13/17   Arnetha Courser, MD    Allergies Indomethacin; Ace inhibitors; Augmentin [amoxicillin-pot clavulanate]; Penicillins; and Sulfur  Family History  Problem Relation Age of Onset  . Emphysema Father   . Asthma Father   . Hypertension Daughter   . Heart disease Daughter        3 stents  . Diabetes Daughter   . Hypertension Son   . Cancer Son        prostate  . Diabetes Son   . Heart disease Daughter        heart attack, 2 stents  . Multiple sclerosis Daughter   . Diabetes Daughter   . Fibromyalgia Daughter   . Diabetes Daughter     Social History Social History   Tobacco Use  . Smoking status: Never Smoker  . Smokeless tobacco: Never Used  . Tobacco comment: smoking cessation materials not required  Substance Use Topics  . Alcohol use: No  . Drug use: No    Review of Systems  Constitutional: + fever, chills, weakness Eyes: Negative for visual changes. ENT: Negative for sore throat. Neck: No neck pain  Cardiovascular: Negative for chest pain. Respiratory: Negative for shortness of breath. Gastrointestinal: Negative for abdominal pain, vomiting or diarrhea. Genitourinary: Negative for dysuria. + discomfort with urination Musculoskeletal: Negative for back pain. Skin: Negative for rash. Neurological: Negative for  headaches, weakness or numbness. Psych: No SI or HI  ____________________________________________   PHYSICAL EXAM:  VITAL SIGNS: ED Triage Vitals  Enc Vitals Group     BP 05/11/18 1710 (!) 153/100     Pulse Rate 05/11/18 1710 (!) 122     Resp 05/11/18 1710 (!) 22     Temp 05/11/18 1710 98.8 F (37.1 C)     Temp Source 05/11/18 1710 Oral     SpO2 05/11/18 1710 100 %     Weight 05/11/18 1711 155 lb (70.3 kg)     Height 05/11/18 1711 5\' 4"  (1.626 m)     Head Circumference --      Peak Flow --      Pain Score 05/11/18 1710 0  Pain Loc --      Pain Edu? --      Excl. in [place]? --     Constitutional: Alert and oriented. Well appearing and in no apparent distress. HEENT:      Head: Normocephalic and atraumatic.         Eyes: Conjunctivae are normal. Sclera is non-icteric.       Mouth/Throat: Mucous membranes are moist.       Neck: Supple with no signs of meningismus. Cardiovascular: Tachycardic with regular rhythm. No murmurs, gallops, or rubs. 2+ symmetrical distal pulses are present in all extremities. No JVD. Respiratory: Tachypneic.Normal respiratory effort. Lungs are clear to auscultation bilaterally. No wheezes, crackles, or rhonchi.  Gastrointestinal: Soft, non tender, and non distended with positive bowel sounds. No rebound or guarding. Genitourinary: No CVA tenderness. Musculoskeletal: Nontender with normal range of motion in all extremities. No edema, cyanosis, or erythema of extremities. Neurologic: Normal speech and language. Face is symmetric. Moving all extremities. No gross focal neurologic deficits are appreciated. Skin: Skin is warm, dry and intact. No rash noted. Psychiatric: Mood and affect are normal. Speech and behavior are normal.  ____________________________________________   LABS (all labs ordered are listed, but only abnormal results are displayed)  Labs Reviewed  COMPREHENSIVE METABOLIC PANEL - Abnormal; Notable for the following components:       Result Value   Sodium 133 (*)    BUN 29 (*)    Creatinine, Ser 1.52 (*)    GFR calc non Af Amer 30 (*)    GFR calc Af Amer 35 (*)    All other components within normal limits  CBC WITH DIFFERENTIAL/PLATELET - Abnormal; Notable for the following components:   WBC 23.1 (*)    Neutro Abs 19.3 (*)    Monocytes Absolute 1.2 (*)    All other components within normal limits  LACTIC ACID, PLASMA - Abnormal; Notable for the following components:   Lactic Acid, Venous 2.1 (*)    All other components within normal limits  CULTURE, BLOOD (ROUTINE X 2)  CULTURE, BLOOD (ROUTINE X 2)  LACTIC ACID, PLASMA  BASIC METABOLIC PANEL  CBC   ____________________________________________  EKG  ED ECG REPORT I, Rudene Re, the attending physician, personally viewed and interpreted this ECG.  Sinus tachycardia, rate of 114, normal intervals, right axis deviation, no ST elevations or depressions, Q waves in V2 and V3.  Unchanged from prior from 05/2015 ____________________________________________  RADIOLOGY  I have personally reviewed the images performed during this visit and I agree with the Radiologist's read.   Interpretation by Radiologist:  Dg Chest 2 View  Result Date: 05/11/2018 CLINICAL DATA:  Leukocytosis and urinary tract infection. Fevers at home. EXAM: CHEST - 2 VIEW COMPARISON:  02/03/2016 and 10/09/2015. FINDINGS: The heart size and mediastinal contours are stable. There is a large hiatal hernia and aortic atherosclerosis. The lungs are clear. There is no pleural effusion or pneumothorax. Mild degenerative changes are present throughout the spine. No acute osseous findings are demonstrated. IMPRESSION: Stable chest without acute cardiopulmonary process. Large hiatal hernia and aortic atherosclerosis. Electronically Signed   By: Richardean Sale M.D.   On: 05/11/2018 17:47     ____________________________________________   PROCEDURES  Procedure(s) performed:  None Procedures Critical Care performed: yes  CRITICAL CARE Performed by: Rudene Re  ?  Total critical care time: 35 min  Critical care time was exclusive of separately billable procedures and treating other patients.  Critical care was necessary to treat or prevent  imminent or life-threatening deterioration.  Critical care was time spent personally by me on the following activities: development of treatment plan with patient and/or surrogate as well as nursing, discussions with consultants, evaluation of patient's response to treatment, examination of patient, obtaining history from patient or surrogate, ordering and performing treatments and interventions, ordering and review of laboratory studies, ordering and review of radiographic studies, pulse oximetry and re-evaluation of patient's condition.  ____________________________________________   INITIAL IMPRESSION / ASSESSMENT AND PLAN / ED COURSE  84 y.o. female with a history of biliary cirrhosis, chronic kidney disease stage III, hypertension, hyperlipidemia who presents for evaluation of urinary tract infection and elevated white count.  Patient meets sepsis criteria on arrival with leukocytosis, tachycardia, tachypnea, and a source of infection.  She is currently afebrile however did take Tylenol earlier today.  Will give IV fluids, check labs.  UA and culture have already been done at the PCPs office and we are able to see those results, therefore I will not repeat them.  Will start patient on Rocephin.  Anticipate admission to the hospitalist service.      As part of my medical decision making, I reviewed the following data within the [place] notes reviewed and incorporated, Labs reviewed , EKG interpreted , Old chart reviewed, Radiograph reviewed , Discussed with admitting physician , Notes from prior ED visits and [place] Controlled Substance Database    Pertinent labs & imaging results that  were available during my care of the patient were reviewed by me and considered in my medical decision making (see chart for details).    ____________________________________________   FINAL CLINICAL IMPRESSION(S) / ED DIAGNOSES  Final diagnoses:  Sepsis due to urinary tract infection ([place])      NEW MEDICATIONS STARTED DURING THIS VISIT:  ED Discharge Orders    None       Note:  This document was prepared using Dragon voice recognition software and may include unintentional dictation errors.    Rudene Re, MD 05/11/18 [phone]

## 2018-05-11 NOTE — Progress Notes (Addendum)
Name: Caitlin Jenkins   MRN: [id number]    DOB: 01-01-1932   Date:05/11/2018       Progress Note  Subjective  Chief Complaint  Chief Complaint  Patient presents with  . Urinary Tract Infection    Chills and odor with urine    HPI  Last 2 days patient felt suprapubic pressure. Stated last night had chills was very cold and pain with urination. Pt does not get uti's often. No back pain, no nausea or vomiting.  Sees GI for cirrhosis for biliary cholangitis last seen 2-3 months ago- states was told she can take acetaminophen id needed.  Sees Dr. Rolly Salter in September this year is trying to drink plenty of water.   Would like uric acid checked has been eating a lot of cherries. States hasn't noticed specific pains or swellings in joints.   Patient Active Problem List   Diagnosis Date Noted  . Primary biliary cholangitis ([place]) 03/20/2017  . Overweight (BMI 25.0-29.9) 11/29/2015  . Carotid artery disease ([place]) 10/09/2015  . Medication monitoring encounter 10/09/2015  . Nail abnormalities 10/09/2015  . Arthralgia of multiple joints 10/09/2015  . Essential hypertension, benign 06/19/2015  . Malignant neoplasm of right external ear 06/19/2015  . 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 Hyperlipidemia   . Hypothyroidism   . Gout   . CKD (chronic kidney disease) stage 3, GFR 30-59 ml/min (HCC)   . Hiatal hernia 06/26/2014    Past Medical History:  Diagnosis Date  . Biliary cirrhosis ([place])   . Cancer of skin of leg   . CKD (chronic kidney disease) stage 3, GFR 30-59 ml/min (HCC)   . Gout   . Hiatal hernia Oct 2015   8.5cm  . History of diverticulitis 2000  . Hyperlipidemia   . Hypertension   . Hypothyroidism   . Skin cancer of nose     Past Surgical History:  Procedure Laterality Date  . ABDOMINAL HYSTERECTOMY  1995  . CATARACT EXTRACTION    . FOOT SURGERY  2000  . HEMORRHOID SURGERY    . THYROID SURGERY  1999   para thyroid    Social  History   Tobacco Use  . Smoking status: Never Smoker  . Smokeless tobacco: Never Used  . Tobacco comment: smoking cessation materials not required  Substance Use Topics  . Alcohol use: No     Current Outpatient Medications:  .  acetaminophen (RA ACETAMINOPHEN) 650 MG CR tablet, Take 650 mg by mouth every 12 (twelve) hours as needed. , Disp: , Rfl:  .  amLODipine (NORVASC) 5 MG tablet, Take 1 tablet (5 mg total) by mouth daily., Disp: 90 tablet, Rfl: 3 .  aspirin EC 81 MG tablet, Take 81 mg by mouth daily. , Disp: , Rfl:  .  atorvastatin (LIPITOR) 10 MG tablet, Take 1 tablet (10 mg total) by mouth at bedtime., Disp: 90 tablet, Rfl: 3 .  BIOTIN PO, Take 250 mg by mouth 2 (two) times daily. , Disp: , Rfl:  .  Cholecalciferol (VITAMIN D-1000 MAX ST) 1000 UNITS tablet, Take 1,000 Units by mouth daily. , Disp: , Rfl:  .  COLACE 100 MG capsule, 100 mg daily., Disp: , Rfl:  .  colchicine 0.6 MG tablet, Take two pills at onset of gout flare, followed by one more one hour later, max of three pills per flare., Disp: 15 tablet, Rfl: 3 .  doxycycline (VIBRA-TABS) 100 MG tablet, Take 1 tablet (100 mg total)  by mouth 2 (two) times daily., Disp: 14 tablet, Rfl: 0 .  levothyroxine (SYNTHROID, LEVOTHROID) 100 MCG tablet, Take 1 tablet (100 mcg total) by mouth daily., Disp: 90 tablet, Rfl: 3 .  losartan (COZAAR) 100 MG tablet, Take 1 tablet (100 mg total) by mouth daily. Avoid salt substitutes, no fruit smoothies, Disp: 90 tablet, Rfl: 3 .  magnesium gluconate (MAGONATE) 500 MG tablet, Take 500 mg by mouth daily., Disp: , Rfl:  .  meclizine (ANTIVERT) 25 MG tablet, Take 1 tablet (25 mg total) by mouth 3 (three) times daily as needed., Disp: 30 tablet, Rfl: 3 .  Multiple Vitamins-Minerals (SENIOR MULTIVITAMIN PLUS PO), Take 1 tablet by mouth daily. , Disp: , Rfl:  .  niacinamide 500 MG tablet, Take 1 tablet (500 mg total) by mouth 2 (two) times daily with a meal., Disp: 180 tablet, Rfl: 1 .  Turmeric 450 MG  CAPS, Take 450 mg by mouth 2 (two) times daily., Disp: , Rfl:  .  ursodiol (ACTIGALL) 300 MG capsule, Take 300 mg by mouth daily. , Disp: , Rfl:  .  vitamin B-12 (CYANOCOBALAMIN) 250 MCG tablet, Take 250 mcg by mouth daily., Disp: , Rfl:  .  febuxostat (ULORIC) 40 MG tablet, Take 1 tablet (40 mg total) by mouth daily. (Patient not taking: Reported on 05/11/2018), Disp: 90 tablet, Rfl: 3  Allergies  Allergen Reactions  . Indomethacin Hives  . Ace Inhibitors Swelling    Other reaction(s): SWELLING Facial swelling. Facial swelling.  . Augmentin [Amoxicillin-Pot Clavulanate] Other (See Comments)    Other reaction(s): Unknown  . Penicillins Nausea And Vomiting    Other reaction(s): Unknown  . Sulfur Other (See Comments)    ROS   No other specific complaints in a complete review of systems (except as listed in HPI above).  Objective  Vitals:   05/11/18 1135  BP: 132/70  Pulse: 93  Temp: 98.5 F (36.9 C)  TempSrc: Oral  SpO2: 99%  Weight: 159 lb 4.8 oz (72.3 kg)  Height: 5\' 4"  (1.626 m)    Body mass index is 27.34 kg/m.  Nursing Note and Vital Signs reviewed.  Physical Exam  Constitutional: She is oriented to person, place, and time. She appears well-developed and well-nourished. No distress.  HENT:  Head: Normocephalic and atraumatic.  Cardiovascular: Normal rate and intact distal pulses.  Pulmonary/Chest: Effort normal and breath sounds normal.  Abdominal: Soft. Bowel sounds are normal. There is no tenderness.  No CVA tenderness   Musculoskeletal: Normal range of motion. She exhibits no tenderness or deformity.  Neurological: She is alert and oriented to person, place, and time.  Skin: Skin is warm and dry. No rash noted. She is not diaphoretic.  Psychiatric: She has a normal mood and affect. Her behavior is normal. Judgment and thought content normal.      No results found for this or any previous visit (from the past 48 hour(s)).  Assessment & Plan 1.  Dysuria - POCT Urinalysis Dipstick - Urine Culture  2. Chills - POCT Urinalysis Dipstick - CBC with Differential - COMPLETE METABOLIC PANEL WITH GFR  3. Cystitis - sulfamethoxazole-trimethoprim (BACTRIM DS,SEPTRA DS) 800-160 MG tablet; Take 1 tablet by mouth 2 (two) times daily.  Dispense: 20 tablet; Refill: 0 - Urine Culture  4. Bilirubin in urine - COMPLETE METABOLIC PANEL WITH GFR  5. Primary biliary cholangitis (HCC) - COMPLETE METABOLIC PANEL WITH GFR  6. CKD (chronic kidney disease) stage 3, GFR 30-59 ml/min (HCC) - COMPLETE METABOLIC PANEL WITH  GFR  7. Elevated uric acid in blood - Uric acid  8. Idiopathic chronic gout of right knee without tophus Asymptomatic concerned due to diet.  - Uric acid    -Red flags and when to present for emergency care or RTC including fever >101.88F, vomiting, weakness, new/worsening/un-resolving symptoms,  reviewed with patient at time of visit. Follow up and care instructions discussed and provided in AVS.  ------------------------------------ I have reviewed this encounter including the documentation in this note and/or discussed this patient with the provider, Suezanne Cheshire DNP AGNP-C. I am certifying that I agree with the content of this note as supervising physician. Enid Derry, [facility] 05/16/2018, 5:25 PM

## 2018-05-11 NOTE — Patient Instructions (Addendum)
-   Please take the antibiotic twice a day with meals for 10 days even if your symptoms resolve sooner.  - Please take a probiotic every day for one month after completion of antibiotic to restore your good gut bacteria.  - Drink plenty of water  - If you are having vomiting, fevers and chills are not controlled with medicine, or you have weakness please go to the ER    Your symptoms should begin to improve within a day of starting antibiotics. But you should finish all the antibiotic pills you get to ensure the bacteria is killed and prevent antibiotic resistance.  If you are having pain when you pee, you can also take a medicine to numb your bladder. Look for Phenazopyridine (Pyridium or AZO) over the counter at your pharmacy. This medicine eases the pain caused by urinary tract infections. It also reduces the need to urinate.  If you frequently get UTI's here are some prevention tips:  - Avoiding spermicides  - Drinking more fluid - This can help prevent bladder infections. ?Urinating right after sex - Some doctors think this helps, because it helps flush out germs that might get into the bladder during sex. There is no proof it works, but it also cannot hurt. ?Vaginal estrogen - If you are a woman who has already been through menopause, your doctor might suggest this. Vaginal estrogen comes in a cream or a flexible ring that you put into your vagina. It can help prevent bladder infections. ?Antibiotics - If you get a lot of bladder infections, and the above methods have not helped, your doctor might give you antibiotics to help prevent infection. But taking antibiotics has downsides, so doctors usually suggest trying other things first   The studies suggesting that cranberry products prevent bladder infections are not very good. Other studies suggest that cranberry products do not prevent bladder infections. But if you want to try cranberry products for this purpose, there is probably not much  harm in doing so.

## 2018-05-11 NOTE — Progress Notes (Signed)
CODE SEPSIS - PHARMACY COMMUNICATION  **Broad Spectrum Antibiotics should be administered within 1 hour of Sepsis diagnosis**  Time Code Sepsis Called/Page Received: 1739  Antibiotics Ordered: ceftriaxone  Time of 1st antibiotic administration: 1810  Additional action taken by pharmacy:   If necessary, Name of Provider/Nurse Contacted:     [place]on Form ,PharmD Clinical Pharmacist  05/11/2018  5:56 PM

## 2018-05-11 NOTE — ED Notes (Signed)
First set of blood cultures sent in triage

## 2018-05-11 NOTE — Telephone Encounter (Signed)
-----   Message from Fredderick Severance, NP sent at 05/11/2018 10:29 AM EDT ----- Will you call this patient to schedule an appointment today for her dysuria and chills. thanks

## 2018-05-11 NOTE — Telephone Encounter (Signed)
Called patient to see how she was doing noted she is weak, still having chills and feels more unwell. WBC 22 and neutrophils 18K, recommended ER. Patient agreeable states will call a ride.

## 2018-05-11 NOTE — H&P (Signed)
[facility] [place] NAME: Caitlin Jenkins    MR#:  [id number]  DATE OF BIRTH:  1932/05/27  DATE OF ADMISSION:  05/11/2018  PRIMARY CARE PHYSICIAN: Arnetha Courser, MD   REQUESTING/REFERRING PHYSICIAN:   CHIEF COMPLAINT:   Chief Complaint  Patient presents with  . Fever    HISTORY OF PRESENT ILLNESS: Caitlin Jenkins  is a 84 y.o. female with a known history of biliary cirrhosis, hyperlipidemia, hypertension, skin cancer of the nose, chronic kidney disease stage III was referred by primary care physician to the emergency room because of urinary tract infection with elevated WBC count.  Patient has fever and chills for the last couple of days.  She has been feeling weak and tired.  Has dysuria.  Patient was evaluated in the emergency room WC count was more than 20,000 and urine showed infection.  She was started on IV Rocephin antibiotic and hospitalist service was consulted.  Patient received IV fluid bolus in the emergency room.  PAST MEDICAL HISTORY:   Past Medical History:  Diagnosis Date  . Biliary cirrhosis ([place])   . Cancer of skin of leg   . CKD (chronic kidney disease) stage 3, GFR 30-59 ml/min (HCC)   . Gout   . Hiatal hernia Oct 2015   8.5cm  . History of diverticulitis 2000  . Hyperlipidemia   . Hypertension   . Hypothyroidism   . Skin cancer of nose     PAST SURGICAL HISTORY:  Past Surgical History:  Procedure Laterality Date  . ABDOMINAL HYSTERECTOMY  1995  . CATARACT EXTRACTION    . FOOT SURGERY  2000  . HEMORRHOID SURGERY    . THYROID SURGERY  1999   para thyroid    SOCIAL HISTORY:  Social History   Tobacco Use  . Smoking status: Never Smoker  . Smokeless tobacco: Never Used  . Tobacco comment: smoking cessation materials not required  Substance Use Topics  . Alcohol use: No    FAMILY HISTORY:  Family History  Problem Relation Age of Onset  . Emphysema Father   . Asthma Father   .  Hypertension Daughter   . Heart disease Daughter        3 stents  . Diabetes Daughter   . Hypertension Son   . Cancer Son        prostate  . Diabetes Son   . Heart disease Daughter        heart attack, 2 stents  . Multiple sclerosis Daughter   . Diabetes Daughter   . Fibromyalgia Daughter   . Diabetes Daughter     DRUG ALLERGIES:  Allergies  Allergen Reactions  . Indomethacin Hives  . Ace Inhibitors Swelling    Other reaction(s): SWELLING Facial swelling. Facial swelling.  . Augmentin [Amoxicillin-Pot Clavulanate] Other (See Comments)    Other reaction(s): Unknown  . Penicillins Nausea And Vomiting    Has patient had a PCN reaction causing immediate rash, facial/tongue/throat swelling, SOB or lightheadedness with hypotension: Yes Has patient had a PCN reaction causing severe rash involving mucus membranes or skin necrosis: No Has patient had a PCN reaction that required hospitalization: No Has patient had a PCN reaction occurring within the last 10 years: No If all of the above answers are "NO", then may proceed with Cephalosporin use.  [name] Sulfur Other (See Comments)    REVIEW OF SYSTEMS:   CONSTITUTIONAL: Has fever, fatigue and weakness.  EYES: No blurred or double  vision.  EARS, NOSE, AND THROAT: No tinnitus or ear pain.  RESPIRATORY: No cough, shortness of breath, wheezing or hemoptysis.  CARDIOVASCULAR: No chest pain, orthopnea, edema.  GASTROINTESTINAL: No nausea, vomiting, diarrhea or abdominal pain.  GENITOURINARY: Has dysuria,no hematuria.  ENDOCRINE: No polyuria, nocturia,  HEMATOLOGY: No anemia, easy bruising or bleeding SKIN: No rash or lesion. MUSCULOSKELETAL: No joint pain or arthritis.   NEUROLOGIC: No tingling, numbness, weakness.  PSYCHIATRY: No anxiety or depression.   MEDICATIONS AT HOME:  Prior to Admission medications   Medication Sig Start Date End Date Taking? Authorizing Provider  amLODipine (NORVASC) 5 MG tablet Take 1 tablet (5 mg total)  by mouth daily. 04/04/17  Yes Lada, Satira Anis, MD  aspirin EC 81 MG tablet Take 81 mg by mouth daily.    Yes [provider]  atorvastatin (LIPITOR) 10 MG tablet Take 1 tablet (10 mg total) by mouth at bedtime. 10/05/17  Yes Lada, Satira Anis, MD  BIOTIN PO Take 250 mg by mouth 2 (two) times daily.    Yes [provider]  Cholecalciferol (VITAMIN D-1000 MAX ST) 1000 UNITS tablet Take 1,000 Units by mouth daily.    Yes [provider]  COLACE 100 MG capsule Take 100 mg by mouth daily.  01/26/15  Yes [provider]  levothyroxine (SYNTHROID, LEVOTHROID) 100 MCG tablet Take 1 tablet (100 mcg total) by mouth daily. 10/05/17  Yes Lada, Satira Anis, MD  losartan (COZAAR) 100 MG tablet Take 1 tablet (100 mg total) by mouth daily. Avoid salt substitutes, no fruit smoothies 10/05/17  Yes Lada, Satira Anis, MD  magnesium gluconate (MAGONATE) 500 MG tablet Take 500 mg by mouth daily.   Yes [provider]  Multiple Vitamins-Minerals (SENIOR MULTIVITAMIN PLUS PO) Take 1 tablet by mouth daily.  01/25/15  Yes [provider]  sulfamethoxazole-trimethoprim (BACTRIM DS,SEPTRA DS) 800-160 MG tablet Take 1 tablet by mouth 2 (two) times daily. 05/11/18  Yes Poulose, Bethel Born, NP  Turmeric 450 MG CAPS Take 450 mg by mouth 2 (two) times daily.   Yes [provider]  ursodiol (ACTIGALL) 300 MG capsule Take 300 mg by mouth daily.  12/16/14  Yes [provider]  vitamin B-12 (CYANOCOBALAMIN) 250 MCG tablet Take 250 mcg by mouth daily.   Yes [provider]  acetaminophen (RA ACETAMINOPHEN) 650 MG CR tablet Take 650 mg by mouth every 12 (twelve) hours as needed.     [provider]  colchicine 0.6 MG tablet Take two pills at onset of gout flare, followed by one more one hour later, max of three pills per flare. 12/14/16   Arnetha Courser, MD  meclizine (ANTIVERT) 25 MG tablet Take 1 tablet (25 mg total) by mouth 3 (three) times daily as  needed. Patient not taking: Reported on 05/11/2018 12/14/16   Arnetha Courser, MD  niacinamide 500 MG tablet Take 1 tablet (500 mg total) by mouth 2 (two) times daily with a meal. Patient not taking: Reported on 05/11/2018 11/13/17   Arnetha Courser, MD      PHYSICAL EXAMINATION:   VITAL SIGNS: Blood pressure (!) 152/74, pulse (!) 108, temperature (!) 103 F (39.4 C), temperature source Axillary, resp. rate (!) 25, height 5\' 4"  (1.626 m), weight 70.3 kg, SpO2 97 %.  GENERAL:  84 y.o.-year-old patient lying in the bed with no acute distress.  EYES: Pupils equal, round, reactive to light and accommodation. No scleral icterus. Extraocular muscles intact.  HEENT: Head atraumatic, normocephalic. Oropharynx  dry and nasopharynx clear.  NECK:  Supple, no jugular venous distention. No thyroid enlargement, no tenderness.  LUNGS: Normal breath sounds bilaterally, no wheezing, rales,rhonchi or crepitation. No use of accessory muscles of respiration.  CARDIOVASCULAR: S1, S2 normal. No murmurs, rubs, or gallops.  ABDOMEN: Soft, nontender, nondistended. Bowel sounds present. No organomegaly or mass.  EXTREMITIES: No pedal edema, cyanosis, or clubbing.  NEUROLOGIC: Cranial nerves II through XII are intact. Muscle strength 5/5 in all extremities. Sensation intact. Gait not checked.  PSYCHIATRIC: The patient is alert and oriented x 3.  SKIN: No obvious rash, lesion, or ulcer.   LABORATORY PANEL:   CBC Recent Labs  Lab 05/11/18 1204 05/11/18 1715  WBC 22.1* 23.1*  HGB 13.2 13.4  HCT 38.0 38.5  PLT 284 290  MCV 87.2 90.4  MCH 30.3 31.4  MCHC 34.7 34.7  RDW 13.0 14.4  LYMPHSABS 2,431 2.6  MONOABS  --  1.2*  EOSABS 88 0.0  BASOSABS 22 0.0   ------------------------------------------------------------------------------------------------------------------  Chemistries  Recent Labs  Lab 05/11/18 1715  NA 133*  K 4.4  CL 100  CO2 22  GLUCOSE 96  BUN 29*  CREATININE 1.52*  CALCIUM 9.6  AST  34  ALT 11  ALKPHOS 87  BILITOT 1.2   ------------------------------------------------------------------------------------------------------------------ estimated creatinine clearance is 26.5 mL/min (A) (by C-G formula based on SCr of 1.52 mg/dL (H)). ------------------------------------------------------------------------------------------------------------------ No results for input(s): TSH, T4TOTAL, T3FREE, THYROIDAB in the last 72 hours.  Invalid input(s): FREET3   Coagulation profile No results for input(s): INR, PROTIME in the last 168 hours. ------------------------------------------------------------------------------------------------------------------- No results for input(s): DDIMER in the last 72 hours. -------------------------------------------------------------------------------------------------------------------  Cardiac Enzymes No results for input(s): CKMB, TROPONINI, MYOGLOBIN in the last 168 hours.  Invalid input(s): CK ------------------------------------------------------------------------------------------------------------------ Invalid input(s): POCBNP  ---------------------------------------------------------------------------------------------------------------  Urinalysis    Component Value Date/Time   COLORURINE DARK YELLOW 04/04/2018 0910   APPEARANCEUR CLEAR 04/04/2018 0910   LABSPEC 1.022 04/04/2018 0910   PHURINE 7.0 04/04/2018 0910   GLUCOSEU NEGATIVE 04/04/2018 0910   HGBUR NEGATIVE 04/04/2018 0910   BILIRUBINUR Small 05/11/2018 1152   KETONESUR NEGATIVE 04/04/2018 0910   PROTEINUR Positive (A) 05/11/2018 1152   PROTEINUR TRACE (A) 04/04/2018 0910   UROBILINOGEN 0.2 05/11/2018 1152   NITRITE Positive 05/11/2018 1152   LEUKOCYTESUR Large (3+) (A) 05/11/2018 1152     RADIOLOGY: Dg Chest 2 View  Result Date: 05/11/2018 CLINICAL DATA:  Leukocytosis and urinary tract infection. Fevers at home. EXAM: CHEST - 2 VIEW COMPARISON:   02/03/2016 and 10/09/2015. FINDINGS: The heart size and mediastinal contours are stable. There is a large hiatal hernia and aortic atherosclerosis. The lungs are clear. There is no pleural effusion or pneumothorax. Mild degenerative changes are present throughout the spine. No acute osseous findings are demonstrated. IMPRESSION: Stable chest without acute cardiopulmonary process. Large hiatal hernia and aortic atherosclerosis. Electronically Signed   By: Richardean Sale M.D.   On: 05/11/2018 17:47    EKG: Orders placed or performed during the hospital encounter of 05/11/18  . ED EKG 12-Lead  . ED EKG 12-Lead    IMPRESSION AND PLAN:  84 year old elderly female patient with biliary cirrhosis, chronic kidney disease stage III, hypertension, hyperlipidemia, skin cancer of the nose presented to the emergency room for fever and chills  -Sepsis secondary to urinary tract infection IV fluids IV Rocephin antibiotic daily Follow blood and urine cultures  -Urinary tract infection IV antibiotics and follow-up cultures  -Dehydration IV fluids  -Chronic kidney disease stage III Monitor  renal function  -DVT prophylaxis subcu heparin  All the records are reviewed and case discussed with ED provider. Management plans discussed with the patient, family and they are in agreement.  CODE STATUS:Full code   TOTAL TIME TAKING CARE OF THIS PATIENT: 53 minutes.    Saundra Shelling M.D on 05/11/2018 at 6:48 PM  Between 7am to 6pm - Pager - [phone]  After 6pm go to [email or web address] - password [email or web address] [facility]  Office  [phone]  CC: Primary care physician; Arnetha Courser, MD

## 2018-05-11 NOTE — ED Triage Notes (Signed)
Pt to ED from home , was called by PCP for elevated WBC  and UTI.  Pt has had fevers at home. Chills noted in triage, pt A&OX4

## 2018-05-11 NOTE — ED Notes (Signed)
Jessica RN, aware of bed assigned  

## 2018-05-12 LAB — CBC
HEMATOCRIT: 34.2 % — AB (ref 35.0–47.0)
HEMOGLOBIN: 11.6 g/dL — AB (ref 12.0–16.0)
MCH: 30.7 pg (ref 26.0–34.0)
MCHC: 34 g/dL (ref 32.0–36.0)
MCV: 90.4 fL (ref 80.0–100.0)
Platelets: 223 10*3/uL (ref 150–440)
RBC: 3.78 MIL/uL — AB (ref 3.80–5.20)
RDW: 14.7 % — ABNORMAL HIGH (ref 11.5–14.5)
WBC: 18.2 10*3/uL — ABNORMAL HIGH (ref 3.6–11.0)

## 2018-05-12 LAB — BASIC METABOLIC PANEL
ANION GAP: 9 (ref 5–15)
BUN: 25 mg/dL — ABNORMAL HIGH (ref 8–23)
CHLORIDE: 103 mmol/L (ref 98–111)
CO2: 23 mmol/L (ref 22–32)
Calcium: 8.8 mg/dL — ABNORMAL LOW (ref 8.9–10.3)
Creatinine, Ser: 1.41 mg/dL — ABNORMAL HIGH (ref 0.44–1.00)
GFR calc non Af Amer: 33 mL/min — ABNORMAL LOW (ref >60)
GFR, EST AFRICAN AMERICAN: 38 mL/min — AB (ref >60)
GLUCOSE: 102 mg/dL — AB (ref 70–99)
Potassium: 4.5 mmol/L (ref 3.5–5.1)
Sodium: 135 mmol/L (ref 135–145)

## 2018-05-12 MED ORDER — CEFDINIR 300 MG PO CAPS
300.0000 mg | ORAL_CAPSULE | Freq: Every day | ORAL | Status: DC
  Filled 2018-05-12: qty 1

## 2018-05-12 MED ORDER — ALBUTEROL SULFATE (2.5 MG/3ML) 0.083% IN NEBU
3.0000 mL | INHALATION_SOLUTION | Freq: Four times a day (QID) | RESPIRATORY_TRACT | Status: DC | PRN
  Administered 2018-05-12 – 2018-05-14 (×5): 3 mL via RESPIRATORY_TRACT
  Filled 2018-05-12 (×5): qty 3

## 2018-05-12 MED ORDER — IBUPROFEN 400 MG PO TABS
400.0000 mg | ORAL_TABLET | Freq: Once | ORAL | Status: AC
  Administered 2018-05-12: 400 mg via ORAL
  Filled 2018-05-12: qty 1

## 2018-05-12 MED ORDER — SODIUM CHLORIDE 0.9 % IV SOLN
1.0000 g | Freq: Two times a day (BID) | INTRAVENOUS | Status: DC
  Administered 2018-05-12 – 2018-05-14 (×5): 1 g via INTRAVENOUS
  Filled 2018-05-12 (×6): qty 1

## 2018-05-12 NOTE — Consult Note (Signed)
Pharmacy Antibiotic Note  Caitlin Jenkins is a 84 y.o. female admitted on 05/11/2018 with sepsis.  Pharmacy has been consulted for Meropenem dosing.  Plan: Start meropenem 1g IV every 12 hours based on current CrCl <14ml/min.   Height: 5\' 4"  (162.6 cm) Weight: 155 lb (70.3 kg) IBW/kg (Calculated) : 54.7  Temp (24hrs), Avg:99.9 F (37.7 C), Min:97.7 F (36.5 C), Max:103 F (39.4 C)  Recent Labs  Lab 05/11/18 1204 05/11/18 1715 05/11/18 2009 05/12/18 0447  WBC 22.1* 23.1*  --  18.2*  CREATININE 1.68* 1.52*  --  1.41*  LATICACIDVEN  --  2.1* 1.1  --     Estimated Creatinine Clearance: 28.6 mL/min (A) (by C-G formula based on SCr of 1.41 mg/dL (H)).    Allergies  Allergen Reactions  . Indomethacin Hives  . Ace Inhibitors Swelling    Other reaction(s): SWELLING Facial swelling. Facial swelling.  . Augmentin [Amoxicillin-Pot Clavulanate] Other (See Comments)    Other reaction(s): Unknown  . Penicillins Nausea And Vomiting    Has patient had a PCN reaction causing immediate rash, facial/tongue/throat swelling, SOB or lightheadedness with hypotension: Yes Has patient had a PCN reaction causing severe rash involving mucus membranes or skin necrosis: No Has patient had a PCN reaction that required hospitalization: No Has patient had a PCN reaction occurring within the last 10 years: No If all of the above answers are "NO", then may proceed with Cephalosporin use.  . Sulfur Other (See Comments)    Antimicrobials this admission: 8/16 ceftriaxone 1g >> x 1 dose  8/17 meropenem >>   Microbiology results: 8/16 BCx: pending 8/16 UCx: pending  Thank you for allowing pharmacy to be a part of this patient's care.  Pernell Dupre, PharmD, BCPS Clinical Pharmacist 05/12/2018 11:39 AM

## 2018-05-12 NOTE — Progress Notes (Signed)
Patient noted to be febrile with chills during shift today provider notified and patient treated accordingly. Fever hs come down and HR, patient no longer having chills. Will continue to monitor patient. Patient's temperature has been taken rectally as the patient's family voiced concern that in the ED the patient's oral temp was in normal range and the axillary showed that the patient was febrile.

## 2018-05-12 NOTE — Progress Notes (Signed)
Patient's family voiced concern as to why the patient had not received her antibiotic this morning and as to when the provider would come and speak with them. Nurse notified family that patient will continue to receive antibiotics and that the patient's antibiotics were not scheduled to be given at this time. Nurse also notified family after speaking with Dr. Tressia Miners via phone that she being the provider would be in to speak with them as soon as possible as she had been called to see a critical patient and was not at this time on the floor. Patient was febrile this morning and was given tylenol by third shift staff and the fever has since come down within a normal range, refer to flowsheets. WBC continue to come down, but remain at an abnormally elevated level. Will continue to monitor patient.

## 2018-05-12 NOTE — Progress Notes (Signed)
[facility] [place] NAME: Bitha Fauteux    MR#:  [id number]  DATE OF BIRTH:  05-Aug-1932  SUBJECTIVE:  CHIEF COMPLAINT:   Chief Complaint  Patient presents with  . Fever   -Spiking fevers this morning, possible upper airway wheeze. -Still has significant chills  REVIEW OF SYSTEMS:  Review of Systems  Constitutional: Positive for chills and fever. Negative for malaise/fatigue.  HENT: Negative for congestion, ear discharge, hearing loss and nosebleeds.   Eyes: Negative for blurred vision and double vision.  Respiratory: Negative for cough, shortness of breath and wheezing.   Cardiovascular: Negative for chest pain, palpitations and leg swelling.  Gastrointestinal: Negative for abdominal pain, constipation, diarrhea, nausea and vomiting.  Genitourinary: Negative for dysuria.  Musculoskeletal: Positive for myalgias.  Neurological: Negative for dizziness, focal weakness, seizures, weakness and headaches.  Psychiatric/Behavioral: Negative for depression.    DRUG ALLERGIES:   Allergies  Allergen Reactions  . Indomethacin Hives  . Ace Inhibitors Swelling    Other reaction(s): SWELLING Facial swelling. Facial swelling.  . Augmentin [Amoxicillin-Pot Clavulanate] Other (See Comments)    Other reaction(s): Unknown  . Penicillins Nausea And Vomiting    Has patient had a PCN reaction causing immediate rash, facial/tongue/throat swelling, SOB or lightheadedness with hypotension: Yes Has patient had a PCN reaction causing severe rash involving mucus membranes or skin necrosis: No Has patient had a PCN reaction that required hospitalization: No Has patient had a PCN reaction occurring within the last 10 years: No If all of the above answers are "NO", then may proceed with Cephalosporin use.  . Sulfur Other (See Comments)    VITALS:  Blood pressure 130/68, pulse (!) 113, temperature 98.6 F (37 C), temperature source Axillary, resp.  rate (!) 21, height 5\' 4"  (1.626 m), weight 70.3 kg, SpO2 97 %.  PHYSICAL EXAMINATION:  Physical Exam   GENERAL:  84 y.o.-year-old patient lying in the bed with no acute distress.  EYES: Pupils equal, round, reactive to light and accommodation. No scleral icterus. Extraocular muscles intact.  HEENT: Head atraumatic, normocephalic. Oropharynx and nasopharynx clear.  NECK:  Supple, no jugular venous distention. No thyroid enlargement, no tenderness.  LUNGS: upper airway wheeze, but  Normal breath sounds bilaterally, no wheezing, rales,rhonchi or crepitation. No use of accessory muscles of respiration.  CARDIOVASCULAR: S1, S2 normal. No murmurs, rubs, or gallops.  ABDOMEN: Soft, nontender, nondistended. Bowel sounds present. No organomegaly or mass.  EXTREMITIES: No pedal edema, cyanosis, or clubbing.  NEUROLOGIC: Cranial nerves II through XII are intact. Muscle strength 5/5 in all extremities. Sensation intact. Gait not checked.  PSYCHIATRIC: The patient is alert and oriented x 3.  SKIN: No obvious rash, lesion, or ulcer.    LABORATORY PANEL:   CBC Recent Labs  Lab 05/12/18 0447  WBC 18.2*  HGB 11.6*  HCT 34.2*  PLT 223   ------------------------------------------------------------------------------------------------------------------  Chemistries  Recent Labs  Lab 05/11/18 1715 05/12/18 0447  NA 133* 135  K 4.4 4.5  CL 100 103  CO2 22 23  GLUCOSE 96 102*  BUN 29* 25*  CREATININE 1.52* 1.41*  CALCIUM 9.6 8.8*  AST 34  --   ALT 11  --   ALKPHOS 87  --   BILITOT 1.2  --    ------------------------------------------------------------------------------------------------------------------  Cardiac Enzymes No results for input(s): TROPONINI in the last 168 hours. ------------------------------------------------------------------------------------------------------------------  RADIOLOGY:  Dg Chest 2 View  Result Date: 05/11/2018 CLINICAL DATA:  Leukocytosis and  urinary tract  infection. Fevers at home. EXAM: CHEST - 2 VIEW COMPARISON:  02/03/2016 and 10/09/2015. FINDINGS: The heart size and mediastinal contours are stable. There is a large hiatal hernia and aortic atherosclerosis. The lungs are clear. There is no pleural effusion or pneumothorax. Mild degenerative changes are present throughout the spine. No acute osseous findings are demonstrated. IMPRESSION: Stable chest without acute cardiopulmonary process. Large hiatal hernia and aortic atherosclerosis. Electronically Signed   By: Richardean Sale M.D.   On: 05/11/2018 17:47    EKG:   Orders placed or performed during the hospital encounter of 05/11/18  . ED EKG 12-Lead  . ED EKG 12-Lead    ASSESSMENT AND PLAN:   84 year old female with past medical history significant for cirrhosis, CKD stage III, hypertension and hypothyroidism presents to hospital secondary to fevers and chills  1.  Sepsis-secondary to urinary tract infection -Follow-up urine cultures and blood cultures. -Received Rocephin in the ED yesterday.  Improving white count.  Still with worsening chills and fevers -We will cover broad-spectrum with meropenem for now until culture results are available.  2.  Hypertension-on Norvasc, losartan  3.  Hypothyroidism-continue Synthroid  4.  Biliary cirrhosis-continue home medications.  5.  DVT prophylaxis-subcutaneous heparin  Physical therapy consult tomorrow   All the records are reviewed and case discussed with Care Management/Social Workerr. Management plans discussed with the patient, family and they are in agreement.  CODE STATUS: Full Code  TOTAL TIME TAKING CARE OF THIS PATIENT: 38 minutes.   POSSIBLE D/C IN 2 DAYS, DEPENDING ON CLINICAL CONDITION.   Gladstone Lighter M.D on 05/12/2018 at 12:26 PM  Between 7am to 6pm - Pager - [phone]  After 6pm go to [email or web address] - password [email or web address] [facility]  Office  [phone]  CC: Primary care  physician; Arnetha Courser, MD

## 2018-05-13 LAB — BASIC METABOLIC PANEL
Anion gap: 6 (ref 5–15)
BUN: 20 mg/dL (ref 8–23)
CO2: 22 mmol/L (ref 22–32)
Calcium: 8.5 mg/dL — ABNORMAL LOW (ref 8.9–10.3)
Chloride: 106 mmol/L (ref 98–111)
Creatinine, Ser: 1.22 mg/dL — ABNORMAL HIGH (ref 0.44–1.00)
GFR calc Af Amer: 46 mL/min — ABNORMAL LOW (ref >60)
GFR, EST NON AFRICAN AMERICAN: 40 mL/min — AB (ref >60)
GLUCOSE: 103 mg/dL — AB (ref 70–99)
POTASSIUM: 4.3 mmol/L (ref 3.5–5.1)
Sodium: 134 mmol/L — ABNORMAL LOW (ref 135–145)

## 2018-05-13 LAB — CBC
HEMATOCRIT: 33.3 % — AB (ref 35.0–47.0)
Hemoglobin: 11.4 g/dL — ABNORMAL LOW (ref 12.0–16.0)
MCH: 31.3 pg (ref 26.0–34.0)
MCHC: 34.3 g/dL (ref 32.0–36.0)
MCV: 91.2 fL (ref 80.0–100.0)
PLATELETS: 194 10*3/uL (ref 150–440)
RBC: 3.65 MIL/uL — AB (ref 3.80–5.20)
RDW: 14.5 % (ref 11.5–14.5)
WBC: 9.3 10*3/uL (ref 3.6–11.0)

## 2018-05-13 MED ORDER — GUAIFENESIN 100 MG/5ML PO SOLN
5.0000 mL | ORAL | Status: DC | PRN
  Filled 2018-05-13 (×2): qty 5

## 2018-05-13 MED ORDER — TERBINAFINE HCL 1 % EX CREA
TOPICAL_CREAM | Freq: Two times a day (BID) | CUTANEOUS | Status: AC
  Administered 2018-05-13 – 2018-05-14 (×3): via TOPICAL
  Filled 2018-05-13 (×2): qty 12

## 2018-05-13 MED ORDER — VITAMIN C 500 MG PO TABS
500.0000 mg | ORAL_TABLET | Freq: Two times a day (BID) | ORAL | Status: DC
  Administered 2018-05-13 – 2018-05-16 (×7): 500 mg via ORAL
  Filled 2018-05-13 (×8): qty 1

## 2018-05-13 MED ORDER — VITAMIN B-1 100 MG PO TABS
100.0000 mg | ORAL_TABLET | Freq: Every day | ORAL | Status: DC
  Administered 2018-05-13 – 2018-05-15 (×3): 100 mg via ORAL
  Filled 2018-05-13 (×3): qty 1

## 2018-05-13 NOTE — Evaluation (Signed)
Physical Therapy Evaluation Patient Details Name: Caitlin Jenkins MRN: [id number] DOB: March 29, 1932 Today's Date: 05/13/2018   History of Present Illness  84 yo female with onset of leukocytosis and UTI was admitted with weakness and shivering, fever.  Has been SOB and chilled mainly.  PMHx:  atherosclerosis, cirrhosis, HTN, CKD 3, skin CA, gout  Clinical Impression  Pt was seen for evaluation of mobility with shivering being a main concern for pt.  Her ability to balance and walk was limited by her chills and general weakness, and will progress with her strengthening and standing balance as tolerated acutely.  Plan for HHPT to follow her, and family has agreed to stay with her to allow a transition directly home.  If this cannot be arranged will need to follow up with SNF potentially as pt cannot walk alone yet.  Stair training for home will be done next visit.    Follow Up Recommendations Home health PT;Supervision for mobility/OOB    Equipment Recommendations  None recommended by PT    Recommendations for Other Services       Precautions / Restrictions Precautions Precautions: Fall Precaution Comments: ck pulses with mobility Restrictions Weight Bearing Restrictions: No      Mobility  Bed Mobility Overal bed mobility: Modified Independent             General bed mobility comments: able to maneuver trunk on bed  Transfers Overall transfer level: Needs assistance Equipment used: 1 person hand held assist(IV pole) Transfers: Sit to/from Stand Sit to Stand: Min guard;Min assist         General transfer comment: using edge of IV pole to steady herself  Ambulation/Gait Ambulation/Gait assistance: Min guard;Min assist Gait Distance (Feet): 70 Feet Assistive device: 1 person hand held assist;IV Pole Gait Pattern/deviations: Step-through pattern;Decreased stride length;Narrow base of support;Drifts right/left Gait velocity: reduced Gait velocity interpretation: <1.31  ft/sec, indicative of household ambulator General Gait Details: walked a short trip as she is chilled and uncomfortable with hallway cool air  Stairs            Wheelchair Mobility    Modified Rankin (Stroke Patients Only)       Balance Overall balance assessment: Needs assistance Sitting-balance support: Feet supported Sitting balance-Leahy Scale: Good     Standing balance support: Bilateral upper extremity supported;During functional activity Standing balance-Leahy Scale: Poor Standing balance comment: requires wb support of arms to maintain balance                             Pertinent Vitals/Pain Pain Assessment: No/denies pain    Home Living Family/patient expects to be discharged to:: Private residence Living Arrangements: Alone Available Help at Discharge: Family;Available 24 hours/day(temporarily) Type of Home: House Home Access: Stairs to enter Entrance Stairs-Rails: Right;Left Entrance Stairs-Number of Steps: 3 to 4 Home Layout: Two level;Other (Comment);Laundry or work area in Database administrator for basement) Home Equipment: Environmental consultant - 2 wheels;Cane - single point Additional Comments: has not needed an assistive device    Prior Function Level of Independence: Independent         Comments: has been driving      Hand Dominance   Dominant Hand: Right    Extremity/Trunk Assessment   Upper Extremity Assessment Upper Extremity Assessment: Overall WFL for tasks assessed    Lower Extremity Assessment Lower Extremity Assessment: Generalized weakness    Cervical / Trunk Assessment Cervical / Trunk Assessment: Kyphotic  Communication  Communication: No difficulties  Cognition Arousal/Alertness: Awake/alert Behavior During Therapy: WFL for tasks assessed/performed Overall Cognitive Status: Within Functional Limits for tasks assessed                                        General Comments      Exercises      Assessment/Plan    PT Assessment Patient needs continued PT services  PT Problem List Decreased strength;Decreased range of motion;Decreased activity tolerance;Decreased balance;Decreased mobility;Decreased coordination;Decreased knowledge of use of DME;Decreased safety awareness;Cardiopulmonary status limiting activity(has elevation of pulses)       PT Treatment Interventions DME instruction;Gait training;Stair training;Functional mobility training;Therapeutic activities;Therapeutic exercise;Balance training;Neuromuscular re-education;Patient/family education    PT Goals (Current goals can be found in the Care Plan section)  Acute Rehab PT Goals Patient Stated Goal: to go home and get stronger, feel better PT Goal Formulation: With patient/family Time For Goal Achievement: 05/27/18 Potential to Achieve Goals: Good    Frequency Min 2X/week   Barriers to discharge Decreased caregiver support;Inaccessible home environment home alone with stairs to enter house    Co-evaluation               AM-PAC PT "6 Clicks" Daily Activity  Outcome Measure Difficulty turning over in bed (including adjusting bedclothes, sheets and blankets)?: A Little Difficulty moving from lying on back to sitting on the side of the bed? : A Little Difficulty sitting down on and standing up from a chair with arms (e.g., wheelchair, bedside commode, etc,.)?: Unable Help needed moving to and from a bed to chair (including a wheelchair)?: A Little Help needed walking in hospital room?: A Little Help needed climbing 3-5 steps with a railing? : A Lot 6 Click Score: 15    End of Session Equipment Utilized During Treatment: Gait belt Activity Tolerance: Treatment limited secondary to medical complications (Comment);Patient limited by fatigue Patient left: in bed;with call bell/phone within reach;with bed alarm set;with family/visitor present Nurse Communication: Mobility status PT Visit Diagnosis:  Unsteadiness on feet (R26.81);Other abnormalities of gait and mobility (R26.89);Muscle weakness (generalized) (M62.81)    Time: 5747-3403 PT Time Calculation (min) (ACUTE ONLY): 28 min   Charges:   PT Evaluation $PT Eval Moderate Complexity: 1 Mod PT Treatments $Gait Training: 8-22 mins       Ramond Dial 05/13/2018, 2:33 PM   Mee Hives, PT MS Acute Rehab Dept. Number: [place] [facility] [place]

## 2018-05-13 NOTE — Progress Notes (Signed)
Nurse called to patient's room by family with concern that patient was becoming febrile again. Nurse tech went and obtained temperature and rectal temp was 100.9. Nurse went to room to administer tylenol and patient was having chills with shivering. Family had covered patient with several blankets and nurse asked if some of the blankets could be removed due to elevated temperature, family was compliant. Patient's granddaughter had questions about patients current treatment plan, nurse notified Dr. Tressia Miners and she was able to speak with the granddaughter via telephone. Will continue to monitor patient.

## 2018-05-13 NOTE — Progress Notes (Signed)
[facility] [place] NAME: Caitlin Jenkins    MR#:  [id number]  DATE OF BIRTH:  August 01, 1932  SUBJECTIVE:  CHIEF COMPLAINT:   Chief Complaint  Patient presents with  . Fever   -Feels much better today, complaints of fungal infection of fingernails and toenails. -Last fever spike, last evening  REVIEW OF SYSTEMS:  Review of Systems  Constitutional: Positive for fever. Negative for chills and malaise/fatigue.  HENT: Negative for congestion, ear discharge, hearing loss and nosebleeds.   Eyes: Negative for blurred vision and double vision.  Respiratory: Negative for cough, shortness of breath and wheezing.   Cardiovascular: Negative for chest pain, palpitations and leg swelling.  Gastrointestinal: Negative for abdominal pain, constipation, diarrhea, nausea and vomiting.  Genitourinary: Negative for dysuria.  Musculoskeletal: Positive for myalgias.  Skin: Positive for rash.  Neurological: Negative for dizziness, focal weakness, seizures, weakness and headaches.  Psychiatric/Behavioral: Negative for depression.    DRUG ALLERGIES:   Allergies  Allergen Reactions  . Indomethacin Hives  . Ace Inhibitors Swelling    Other reaction(s): SWELLING Facial swelling. Facial swelling.  . Augmentin [Amoxicillin-Pot Clavulanate] Other (See Comments)    Other reaction(s): Unknown  . Penicillins Nausea And Vomiting    Has patient had a PCN reaction causing immediate rash, facial/tongue/throat swelling, SOB or lightheadedness with hypotension: Yes Has patient had a PCN reaction causing severe rash involving mucus membranes or skin necrosis: No Has patient had a PCN reaction that required hospitalization: No Has patient had a PCN reaction occurring within the last 10 years: No If all of the above answers are "NO", then may proceed with Cephalosporin use.  . Sulfur Other (See Comments)    VITALS:  Blood pressure 131/69, pulse 87, temperature (!)  97.5 F (36.4 C), temperature source Oral, resp. rate 18, height 5\' 4"  (1.626 m), weight 70.3 kg, SpO2 96 %.  PHYSICAL EXAMINATION:  Physical Exam   GENERAL:  84 y.o.-year-old patient lying in the bed with no acute distress.  EYES: Pupils equal, round, reactive to light and accommodation. No scleral icterus. Extraocular muscles intact.  HEENT: Head atraumatic, normocephalic. Oropharynx and nasopharynx clear.  NECK:  Supple, no jugular venous distention. No thyroid enlargement, no tenderness.  LUNGS: upper airway wheeze, but  Normal breath sounds bilaterally, no wheezing, rales,rhonchi or crepitation. No use of accessory muscles of respiration.  CARDIOVASCULAR: S1, S2 normal. No murmurs, rubs, or gallops.  ABDOMEN: Soft, nontender, nondistended. Bowel sounds present. No organomegaly or mass.  EXTREMITIES: No pedal edema, cyanosis, or clubbing.  NEUROLOGIC: Cranial nerves II through XII are intact. Muscle strength 5/5 in all extremities. Sensation intact. Gait not checked.  PSYCHIATRIC: The patient is alert and oriented x 3.  SKIN: No obvious rash, lesion, or ulcer.    LABORATORY PANEL:   CBC Recent Labs  Lab 05/13/18 0530  WBC 9.3  HGB 11.4*  HCT 33.3*  PLT 194   ------------------------------------------------------------------------------------------------------------------  Chemistries  Recent Labs  Lab 05/11/18 1715  05/13/18 0530  NA 133*   < > 134*  K 4.4   < > 4.3  CL 100   < > 106  CO2 22   < > 22  GLUCOSE 96   < > 103*  BUN 29*   < > 20  CREATININE 1.52*   < > 1.22*  CALCIUM 9.6   < > 8.5*  AST 34  --   --   ALT 11  --   --  ALKPHOS 87  --   --   BILITOT 1.2  --   --    < > = values in this interval not displayed.   ------------------------------------------------------------------------------------------------------------------  Cardiac Enzymes No results for input(s): TROPONINI in the last 168  hours. ------------------------------------------------------------------------------------------------------------------  RADIOLOGY:  Dg Chest 2 View  Result Date: 05/11/2018 CLINICAL DATA:  Leukocytosis and urinary tract infection. Fevers at home. EXAM: CHEST - 2 VIEW COMPARISON:  02/03/2016 and 10/09/2015. FINDINGS: The heart size and mediastinal contours are stable. There is a large hiatal hernia and aortic atherosclerosis. The lungs are clear. There is no pleural effusion or pneumothorax. Mild degenerative changes are present throughout the spine. No acute osseous findings are demonstrated. IMPRESSION: Stable chest without acute cardiopulmonary process. Large hiatal hernia and aortic atherosclerosis. Electronically Signed   By: Richardean Sale M.D.   On: 05/11/2018 17:47    EKG:   Orders placed or performed during the hospital encounter of 05/11/18  . ED EKG 12-Lead  . ED EKG 12-Lead    ASSESSMENT AND PLAN:   84 year old female with past medical history significant for cirrhosis, CKD stage III, hypertension and hypothyroidism presents to hospital secondary to fevers and chills  1.  Sepsis-secondary to urinary tract infection -Blood cultures are negative.  Urine cultures growing gram-negative bacilli. -Received Rocephin in the ED. currently on meropenem-due to spiking fevers all day yesterday.  Continue meropenem until final sensitivities are available. -WBC normalized today -Fluids can be discontinued in a.m.  2.  Hypertension-on Norvasc, losartan  3.  Hypothyroidism-continue Synthroid  4.  Biliary cirrhosis-continue home medications.  5.  DVT prophylaxis-subcutaneous heparin  6.  Fungal nail infections-try Lamisil cream  Physical therapy consulted Likely home with health tomorrow   All the records are reviewed and case discussed with Care Management/Social Workerr. Management plans discussed with the patient, family and they are in agreement.  CODE STATUS: Full  Code  TOTAL TIME TAKING CARE OF THIS PATIENT: 36 minutes.   POSSIBLE D/C IN 2 DAYS, DEPENDING ON CLINICAL CONDITION.   Gladstone Lighter M.D on 05/13/2018 at 9:37 AM  Between 7am to 6pm - Pager - [phone]  After 6pm go to [email or web address] - password [email or web address] [facility]  Office  [phone]  CC: Primary care physician; Arnetha Courser, MD

## 2018-05-14 ENCOUNTER — Inpatient Hospital Stay: Payer: PPO

## 2018-05-14 ENCOUNTER — Telehealth: Payer: Self-pay

## 2018-05-14 LAB — COMPLETE METABOLIC PANEL WITH GFR
AG Ratio: 1.3 (calc) (ref 1.0–2.5)
ALT: 10 U/L (ref 6–29)
AST: 22 U/L (ref 10–35)
Albumin: 4.3 g/dL (ref 3.6–5.1)
Alkaline phosphatase (APISO): 96 U/L (ref 33–130)
BUN/Creatinine Ratio: 17 (calc) (ref 6–22)
BUN: 29 mg/dL — ABNORMAL HIGH (ref 7–25)
CO2: 25 mmol/L (ref 20–32)
Calcium: 10 mg/dL (ref 8.6–10.4)
Chloride: 100 mmol/L (ref 98–110)
Creat: 1.68 mg/dL — ABNORMAL HIGH (ref 0.60–0.88)
GFR, Est African American: 32 mL/min/{1.73_m2} — ABNORMAL LOW (ref >=60)
GFR, Est Non African American: 28 mL/min/{1.73_m2} — ABNORMAL LOW (ref >=60)
Globulin: 3.2 g/dL (calc) (ref 1.9–3.7)
Glucose, Bld: 122 mg/dL — ABNORMAL HIGH (ref 65–99)
Potassium: 4.6 mmol/L (ref 3.5–5.3)
Sodium: 136 mmol/L (ref 135–146)
Total Bilirubin: 0.7 mg/dL (ref 0.2–1.2)
Total Protein: 7.5 g/dL (ref 6.1–8.1)

## 2018-05-14 LAB — BASIC METABOLIC PANEL
ANION GAP: 7 (ref 5–15)
BUN: 12 mg/dL (ref 8–23)
CHLORIDE: 108 mmol/L (ref 98–111)
CO2: 21 mmol/L — ABNORMAL LOW (ref 22–32)
Calcium: 8.8 mg/dL — ABNORMAL LOW (ref 8.9–10.3)
Creatinine, Ser: 0.92 mg/dL (ref 0.44–1.00)
GFR, EST NON AFRICAN AMERICAN: 56 mL/min — AB (ref >60)
Glucose, Bld: 94 mg/dL (ref 70–99)
POTASSIUM: 3.9 mmol/L (ref 3.5–5.1)
SODIUM: 136 mmol/L (ref 135–145)

## 2018-05-14 LAB — URINE CULTURE
MICRO NUMBER:: 90978690
SPECIMEN QUALITY:: ADEQUATE

## 2018-05-14 LAB — CBC WITH DIFFERENTIAL/PLATELET
Basophils Absolute: 22 cells/uL (ref 0–200)
Basophils Relative: 0.1 %
Eosinophils Absolute: 88 cells/uL (ref 15–500)
Eosinophils Relative: 0.4 %
HCT: 38 % (ref 35.0–45.0)
Hemoglobin: 13.2 g/dL (ref 11.7–15.5)
Lymphs Abs: 2431 cells/uL (ref 850–3900)
MCH: 30.3 pg (ref 27.0–33.0)
MCHC: 34.7 g/dL (ref 32.0–36.0)
MCV: 87.2 fL (ref 80.0–100.0)
MONOS PCT: 5.3 %
MPV: 8.7 fL (ref 7.5–12.5)
NEUTROS ABS: 18387 {cells}/uL — AB (ref 1500–7800)
Neutrophils Relative %: 83.2 %
PLATELETS: 284 10*3/uL (ref 140–400)
RBC: 4.36 10*6/uL (ref 3.80–5.10)
RDW: 13 % (ref 11.0–15.0)
Total Lymphocyte: 11 %
WBC mixed population: 1171 cells/uL — ABNORMAL HIGH (ref 200–950)
WBC: 22.1 10*3/uL — ABNORMAL HIGH (ref 3.8–10.8)

## 2018-05-14 LAB — CBC
HCT: 34.2 % — ABNORMAL LOW (ref 35.0–47.0)
HEMOGLOBIN: 11.8 g/dL — AB (ref 12.0–16.0)
MCH: 31.7 pg (ref 26.0–34.0)
MCHC: 34.6 g/dL (ref 32.0–36.0)
MCV: 91.6 fL (ref 80.0–100.0)
Platelets: 208 10*3/uL (ref 150–440)
RBC: 3.73 MIL/uL — AB (ref 3.80–5.20)
RDW: 14.8 % — ABNORMAL HIGH (ref 11.5–14.5)
WBC: 12.3 10*3/uL — AB (ref 3.6–11.0)

## 2018-05-14 LAB — GLUCOSE, CAPILLARY
GLUCOSE-CAPILLARY: 104 mg/dL — AB (ref 70–99)
GLUCOSE-CAPILLARY: 179 mg/dL — AB (ref 70–99)
Glucose-Capillary: 78 mg/dL (ref 70–99)

## 2018-05-14 LAB — URIC ACID: Uric Acid, Serum: 8.5 mg/dL — ABNORMAL HIGH (ref 2.5–7.0)

## 2018-05-14 MED ORDER — ALBUTEROL SULFATE (2.5 MG/3ML) 0.083% IN NEBU
2.5000 mg | INHALATION_SOLUTION | Freq: Three times a day (TID) | RESPIRATORY_TRACT | Status: DC
  Administered 2018-05-14 – 2018-05-16 (×6): 2.5 mg via RESPIRATORY_TRACT
  Filled 2018-05-14 (×8): qty 3

## 2018-05-14 MED ORDER — LEVOFLOXACIN IN D5W 750 MG/150ML IV SOLN
750.0000 mg | INTRAVENOUS | Status: DC
  Administered 2018-05-14: 750 mg via INTRAVENOUS
  Filled 2018-05-14: qty 150

## 2018-05-14 MED ORDER — LEVOFLOXACIN IN D5W 750 MG/150ML IV SOLN
750.0000 mg | INTRAVENOUS | Status: DC
  Filled 2018-05-14: qty 150

## 2018-05-14 NOTE — Telephone Encounter (Signed)
Copied from [place] [id number]. Topic: General - Other >> May 14, 2018  1:04 PM Vernona Rieger wrote: Patient's daughter [name]) called and wanted Dr Sanda Klein to know she has been in the hospital since Friday. She said that she had an xray today and they are telling her that she has COPD and emphysema. She said she doesn't believe that is right and she wants Dr Sanda Klein to give her a call. Call back # [phone]

## 2018-05-14 NOTE — Progress Notes (Signed)
PT Cancellation Note  Patient Details Name: Caitlin Jenkins MRN: [id number] DOB: 1932/07/15   Cancelled Treatment:    Reason Eval/Treat Not Completed: Patient declined, no reason specified;Fatigue/lethargy limiting ability to participate. Treatment attempted; pt refuses noting feeling worse today with difficulty breathing, fever and general malaise. Pt notes she walked a great deal with family last evening. Pt encouraged when she does feel improved to ensure nursing is aware of pt ambulating and to pace self. Re attempt tomorrow.    Larae Grooms, PTA 05/14/2018, 1:27 PM

## 2018-05-14 NOTE — Telephone Encounter (Signed)
I'm so sorry, but I do not see that I have permission to talk to this family member in the chart. If I am missing the location of the DPR and just cannot find it, I apologize, but I do not see that I can talk with her. She was here for visits in July and August, but I do not see an updated form.

## 2018-05-14 NOTE — Care Management Important Message (Signed)
Copy of signed IM left with patient in room.  

## 2018-05-14 NOTE — Progress Notes (Signed)
PHARMACY NOTE:  ANTIMICROBIAL RENAL DOSAGE ADJUSTMENT  Current antimicrobial regimen includes a mismatch between antimicrobial dosage and estimated renal function.  As per policy approved by the Pharmacy & Therapeutics and Medical Executive Committees, the antimicrobial dosage will be adjusted accordingly.  Current antimicrobial dosage:  Levofloxacin 750mg  daily  Indication: CAP/UTI  Renal Function:  Estimated Creatinine Clearance: 43.8 mL/min (by C-G formula based on SCr of 0.92 mg/dL). []      On intermittent HD, scheduled: []      On CRRT    Antimicrobial dosage has been changed to:  levofloxacin 750mg  q 48hr  Additional comments:   Thank you for allowing pharmacy to be a part of this patient's care.  Ramond Dial, Pharm.D, BCPS Clinical Pharmacist 05/14/2018 2:23 PM

## 2018-05-14 NOTE — Progress Notes (Signed)
[facility] [place] NAME: Caitlin Jenkins    MR#:  [id number]  DATE OF BIRTH:  1932-04-19  SUBJECTIVE:  CHIEF COMPLAINT:   Chief Complaint  Patient presents with  . Fever  Complaining of wheezing, chest x-ray noted for emphysema-respiratory therapy to see, incentive spirometer given atelectasis/emphysema on chest x-ray, daughter at the bedside, E. coli noted on cultures with sensitivities-start Levaquin, discontinue meropenem  REVIEW OF SYSTEMS:  CONSTITUTIONAL: No fever, fatigue or weakness.  EYES: No blurred or double vision.  EARS, NOSE, AND THROAT: No tinnitus or ear pain.  RESPIRATORY: No cough, shortness of breath, wheezing or hemoptysis.  CARDIOVASCULAR: No chest pain, orthopnea, edema.  GASTROINTESTINAL: No nausea, vomiting, diarrhea or abdominal pain.  GENITOURINARY: No dysuria, hematuria.  ENDOCRINE: No polyuria, nocturia,  HEMATOLOGY: No anemia, easy bruising or bleeding SKIN: No rash or lesion. MUSCULOSKELETAL: No joint pain or arthritis.   NEUROLOGIC: No tingling, numbness, weakness.  PSYCHIATRY: No anxiety or depression.   ROS  DRUG ALLERGIES:   Allergies  Allergen Reactions  . Indomethacin Hives  . Ace Inhibitors Swelling    Other reaction(s): SWELLING Facial swelling. Facial swelling.  . Augmentin [Amoxicillin-Pot Clavulanate] Other (See Comments)    Other reaction(s): Unknown  . Penicillins Nausea And Vomiting    Has patient had a PCN reaction causing immediate rash, facial/tongue/throat swelling, SOB or lightheadedness with hypotension: Yes Has patient had a PCN reaction causing severe rash involving mucus membranes or skin necrosis: No Has patient had a PCN reaction that required hospitalization: No Has patient had a PCN reaction occurring within the last 10 years: No If all of the above answers are "NO", then may proceed with Cephalosporin use.  . Sulfur Other (See Comments)    VITALS:  Blood pressure  (!) 150/83, pulse 90, temperature (!) 97.4 F (36.3 C), temperature source Oral, resp. rate 19, height 5\' 4"  (1.626 m), weight 70.3 kg, SpO2 98 %.  PHYSICAL EXAMINATION:  GENERAL:  84 y.o.-year-old patient lying in the bed with no acute distress.  EYES: Pupils equal, round, reactive to light and accommodation. No scleral icterus. Extraocular muscles intact.  HEENT: Head atraumatic, normocephalic. Oropharynx and nasopharynx clear.  NECK:  Supple, no jugular venous distention. No thyroid enlargement, no tenderness.  LUNGS: Normal breath sounds bilaterally, no wheezing, rales,rhonchi or crepitation. No use of accessory muscles of respiration.  CARDIOVASCULAR: S1, S2 normal. No murmurs, rubs, or gallops.  ABDOMEN: Soft, nontender, nondistended. Bowel sounds present. No organomegaly or mass.  EXTREMITIES: No pedal edema, cyanosis, or clubbing.  NEUROLOGIC: Cranial nerves II through XII are intact. Muscle strength 5/5 in all extremities. Sensation intact. Gait not checked.  PSYCHIATRIC: The patient is alert and oriented x 3.  SKIN: No obvious rash, lesion, or ulcer.   Physical Exam LABORATORY PANEL:   CBC Recent Labs  Lab 05/14/18 0440  WBC 12.3*  HGB 11.8*  HCT 34.2*  PLT 208   ------------------------------------------------------------------------------------------------------------------  Chemistries  Recent Labs  Lab 05/11/18 1715  05/14/18 0440  NA 133*   < > 136  K 4.4   < > 3.9  CL 100   < > 108  CO2 22   < > 21*  GLUCOSE 96   < > 94  BUN 29*   < > 12  CREATININE 1.52*   < > 0.92  CALCIUM 9.6   < > 8.8*  AST 34  --   --   ALT 11  --   --  ALKPHOS 87  --   --   BILITOT 1.2  --   --    < > = values in this interval not displayed.   ------------------------------------------------------------------------------------------------------------------  Cardiac Enzymes No results for input(s): TROPONINI in the last 168  hours. ------------------------------------------------------------------------------------------------------------------  RADIOLOGY:  Dg Chest 2 View  Result Date: 05/14/2018 CLINICAL DATA:  Wheezing EXAM: CHEST - 2 VIEW COMPARISON:  05/13/2018 FINDINGS: The lungs are clear without focal pneumonia, edema, pneumothorax or pleural effusion. Interstitial markings are diffusely coarsened with chronic features. Hyperexpansion suggests emphysema. The cardiopericardial silhouette is within normal limits for size. Moderate hiatal hernia, stable. The visualized bony structures of the thorax are intact. IMPRESSION: Chronic interstitial coarsening with hyperexpansion. Features suggest emphysema. No substantial pleural effusion or focal airspace consolidation. There is some minimal basilar airspace opacity which may be related to atelectasis or pneumonia. Hiatal hernia. Electronically Signed   By: Misty Stanley M.D.   On: 05/14/2018 09:45    ASSESSMENT AND PLAN:  84 year old female with past medical history significant for cirrhosis, CKD stage III, hypertension and hypothyroidism presents to hospital secondary to fevers and chills  *Acute sepsis Resolved  Secondary to E. coli urinary tract infection   *Acute E. coli urinary tract infection  Levaquin for 5 to 7-day course, discontinue IV fluids   *COPD  2 view chest x-ray noted for emphysema changes/atelectasis  Breathing treatments, respiratory therapy to see, antibiotics per above, inhaled corticosteroids, duo nebs as needed, increase activity/encourage ambulation   *Hypertension Stable on Norvasc, losartan  *Hypothyroidism continue Synthroid  *Biliary cirrhosis continue home medications  *Fungal nail infections Continue Lamisil cream  Disposition to home once afebrile for 24 hours   All the records are reviewed and case discussed with Care Management/Social Workerr. Management plans discussed with the patient, family and they are  in agreement.  CODE STATUS: full  TOTAL TIME TAKING CARE OF THIS PATIENT: 45 minutes.     POSSIBLE D/C IN 1-2 DAYS, DEPENDING ON CLINICAL CONDITION.   Avel Peace Salary M.D on 05/14/2018   Between 7am to 6pm - Pager - [phone]  After 6pm go to [email or web address] - password [email or web address] [facility]  Office  [phone]  CC: Primary care physician; Arnetha Courser, MD  Note: This dictation was prepared with Dragon dictation along with smaller phrase technology. Any transcriptional errors that result from this process are unintentional.

## 2018-05-14 NOTE — Progress Notes (Signed)
Patient assessed by RT per Dr. Jerelyn Charles instruction to RT order. Patient changed to TID per protocol assessment.

## 2018-05-15 MED ORDER — SODIUM CHLORIDE 0.9 % IV SOLN
1.0000 g | INTRAVENOUS | Status: DC
  Administered 2018-05-15: 1 g via INTRAVENOUS
  Filled 2018-05-15: qty 10
  Filled 2018-05-15: qty 1

## 2018-05-15 MED ORDER — SODIUM CHLORIDE 0.9 % IV SOLN: 1.0000 g | Freq: Every day | INTRAVENOUS | Status: DC

## 2018-05-15 MED ORDER — LEVOFLOXACIN IN D5W 250 MG/50ML IV SOLN
250.0000 mg | INTRAVENOUS | Status: DC
  Filled 2018-05-15 (×2): qty 50

## 2018-05-15 MED ORDER — ENOXAPARIN SODIUM 40 MG/0.4ML ~~LOC~~ SOLN
40.0000 mg | SUBCUTANEOUS | Status: DC
  Administered 2018-05-15: 40 mg via SUBCUTANEOUS
  Filled 2018-05-15: qty 0.4

## 2018-05-15 MED ORDER — DIPHENHYDRAMINE HCL 12.5 MG/5ML PO ELIX
12.5000 mg | ORAL_SOLUTION | Freq: Once | ORAL | Status: AC
  Administered 2018-05-15: 12.5 mg via ORAL
  Filled 2018-05-15: qty 5

## 2018-05-15 MED ORDER — VITAMIN B-1 100 MG PO TABS
100.0000 mg | ORAL_TABLET | Freq: Two times a day (BID) | ORAL | Status: DC
  Administered 2018-05-15 – 2018-05-16 (×2): 100 mg via ORAL
  Filled 2018-05-15 (×2): qty 1

## 2018-05-15 NOTE — Progress Notes (Signed)
MD notified that after pt received IV rocephin pt complained that the "top of her mouth felt numbed." Pt is not experiencing any swelling, itching, shortness of breath, or pain. New orders placed by MD.   Caitlin Jenkins

## 2018-05-15 NOTE — Progress Notes (Signed)
[facility] NAME: Caitlin Jenkins    MR#:  [id number]  DATE OF BIRTH:  08-04-1932  SUBJECTIVE:  CHIEF COMPLAINT:   Chief Complaint  Patient presents with  . Fever  had fever up to 101.4 y'day, hard of hearing, both daughters at bedside REVIEW OF SYSTEMS:  CONSTITUTIONAL: No fever, fatigue or weakness.  EYES: No blurred or double vision.  EARS, NOSE, AND THROAT: No tinnitus or ear pain.  RESPIRATORY: No cough, shortness of breath, wheezing or hemoptysis.  CARDIOVASCULAR: No chest pain, orthopnea, edema.  GASTROINTESTINAL: No nausea, vomiting, diarrhea or abdominal pain.  GENITOURINARY: No dysuria, hematuria.  ENDOCRINE: No polyuria, nocturia,  HEMATOLOGY: No anemia, easy bruising or bleeding SKIN: No rash or lesion. MUSCULOSKELETAL: No joint pain or arthritis.   NEUROLOGIC: No tingling, numbness, weakness.  PSYCHIATRY: No anxiety or depression.   ROS  DRUG ALLERGIES:   Allergies  Allergen Reactions  . Indomethacin Hives  . Ace Inhibitors Swelling    Other reaction(s): SWELLING Facial swelling. Facial swelling.  . Augmentin [Amoxicillin-Pot Clavulanate] Other (See Comments)    Other reaction(s): Unknown  . Penicillins Nausea And Vomiting    Has patient had a PCN reaction causing immediate rash, facial/tongue/throat swelling, SOB or lightheadedness with hypotension: Yes Has patient had a PCN reaction causing severe rash involving mucus membranes or skin necrosis: No Has patient had a PCN reaction that required hospitalization: No Has patient had a PCN reaction occurring within the last 10 years: No If all of the above answers are "NO", then may proceed with Cephalosporin use.  . Sulfur Other (See Comments)    VITALS:  Blood pressure (!) 141/77, pulse 100, temperature 98 F (36.7 C), temperature source Oral, resp. rate 20, height 5\' 4"  (1.626 m), weight 70.3 kg, SpO2 98 %.  PHYSICAL EXAMINATION:  GENERAL:  84  y.o.-year-old patient lying in the bed with no acute distress.  EYES: Pupils equal, round, reactive to light and accommodation. No scleral icterus. Extraocular muscles intact.  HEENT: Head atraumatic, normocephalic. Oropharynx and nasopharynx clear.  NECK:  Supple, no jugular venous distention. No thyroid enlargement, no tenderness.  LUNGS: Normal breath sounds bilaterally, no wheezing, rales,rhonchi or crepitation. No use of accessory muscles of respiration.  CARDIOVASCULAR: S1, S2 normal. No murmurs, rubs, or gallops.  ABDOMEN: Soft, nontender, nondistended. Bowel sounds present. No organomegaly or mass.  EXTREMITIES: No pedal edema, cyanosis, or clubbing.  NEUROLOGIC: Cranial nerves II through XII are intact. Muscle strength 5/5 in all extremities. Sensation intact. Gait not checked.  PSYCHIATRIC: The patient is alert and oriented x 3.  SKIN: No obvious rash, lesion, or ulcer.   Physical Exam LABORATORY PANEL:   CBC Recent Labs  Lab 05/14/18 0440  WBC 12.3*  HGB 11.8*  HCT 34.2*  PLT 208   ------------------------------------------------------------------------------------------------------------------  Chemistries  Recent Labs  Lab 05/11/18 1715  05/14/18 0440  NA 133*   < > 136  K 4.4   < > 3.9  CL 100   < > 108  CO2 22   < > 21*  GLUCOSE 96   < > 94  BUN 29*   < > 12  CREATININE 1.52*   < > 0.92  CALCIUM 9.6   < > 8.8*  AST 34  --   --   ALT 11  --   --   ALKPHOS 87  --   --   BILITOT 1.2  --   --    < > =  values in this interval not displayed.   ------------------------------------------------------------------------------------------------------------------  Cardiac Enzymes No results for input(s): TROPONINI in the last 168 hours. ------------------------------------------------------------------------------------------------------------------  RADIOLOGY:  Dg Chest 2 View  Result Date: 05/14/2018 CLINICAL DATA:  Wheezing EXAM: CHEST - 2 VIEW COMPARISON:   05/13/2018 FINDINGS: The lungs are clear without focal pneumonia, edema, pneumothorax or pleural effusion. Interstitial markings are diffusely coarsened with chronic features. Hyperexpansion suggests emphysema. The cardiopericardial silhouette is within normal limits for size. Moderate hiatal hernia, stable. The visualized bony structures of the thorax are intact. IMPRESSION: Chronic interstitial coarsening with hyperexpansion. Features suggest emphysema. No substantial pleural effusion or focal airspace consolidation. There is some minimal basilar airspace opacity which may be related to atelectasis or pneumonia. Hiatal hernia. Electronically Signed   By: Misty Stanley M.D.   On: 05/14/2018 09:45    ASSESSMENT AND PLAN:  84 year old female with past medical history significant for cirrhosis, CKD stage III, hypertension and hypothyroidism presents to hospital secondary to fevers and chills  *Acute sepsis: present on admission Secondary to E. coli urinary tract infection   *Acute E. coli urinary tract infection  Levaquin for 5 to 7-day course. I tried Rocephin one time today as family was concerned about c.diff due to Levaquin but she had some itching in throat after rocephin (h/o PCN allergy) - will change it back to levaquin  *COPD - stable 2 view chest x-ray noted for emphysema changes/atelectasis  Breathing treatments, antibiotics per above, inhaled corticosteroids, duo nebs as needed, increase activity/encourage ambulation   *Hypertension Stable on Norvasc, losartan  *Hypothyroidism continue Synthroid  *Biliary cirrhosis continue home medications  *Fungal nail infections Continue Lamisil cream  Disposition to home once afebrile for 24 hours   All the records are reviewed and case discussed with Care Management/Social Workerr. Management plans discussed with the patient, family (2 daughter at bedside) and they are in agreement.  CODE STATUS: full  TOTAL TIME TAKING CARE OF  THIS PATIENT: 35 minutes.     POSSIBLE D/C IN 1-2 DAYS, DEPENDING ON CLINICAL CONDITION.   Max Sane M.D on 05/15/2018   Between 7am to 6pm - Pager - [phone]  After 6pm go to [email or web address] - password [email or web address] [facility]  Office  [phone]  CC: Primary care physician; Arnetha Courser, MD  Note: This dictation was prepared with Dragon dictation along with smaller phrase technology. Any transcriptional errors that result from this process are unintentional.

## 2018-05-15 NOTE — Progress Notes (Signed)
Physical Therapy Treatment Patient Details Name: Caitlin Jenkins MRN: [id number] DOB: 12-Oct-1931 Today's Date: 05/15/2018    History of Present Illness 84 yo female with onset of leukocytosis and UTI was admitted with weakness and shivering, fever.  Has been SOB and chilled mainly.  PMHx:  atherosclerosis, cirrhosis, HTN, CKD 3, skin CA, gout    PT Comments    Pt seen this morning and states that she is doing well. She states that her breathing is doing much better. Pt instructed in and tolerates LE there-ex well and was very pleased to have there-ex that she can perform to get her strength back. Encouraged pt to perform there-ex BID until d/c by HHPT. Pt amb 200' total with one seated rest break. Initial 100' with close supervision, no AD and WFL gait however pt fatigues during. After seated rest break pt amb additional 100' with hand held assistance and CGA, pt appears more unsteady however has no LOB. Pt states that she has never used AD and has no intent to now, educated pt to have someone always with her during OOB activities due to unsteadiness during mobility when she fatigues. O2 saturation at 98% on RA at rest and decreases to 91% with amb however quickly recovers with seated rest. Pts HR elevates from 96 at rest to 111 with amb however decreases back to WNL with seated rest. Pt could benefit from continued skilled therapy at this time to improve deficits toward PLOF. PT will continue to work with pt at least 2x/week while admitted. D/c recommendations continue to be home with HHPT and OOB supervision.    Follow Up Recommendations  Home health PT;Supervision for mobility/OOB     Equipment Recommendations  None recommended by PT    Recommendations for Other Services       Precautions / Restrictions Precautions Precautions: Fall Precaution Comments: ck pulses with mobility Restrictions Weight Bearing Restrictions: No    Mobility  Bed Mobility               General bed  mobility comments: Not assessed this visit. Up in chair upon arrival prefers to stay up upon completion  Transfers Overall transfer level: Independent Equipment used: None Transfers: Sit to/from Stand Sit to Stand: Independent         General transfer comment: Pt transfers sit<>stand independently without difficulty or LOB  Ambulation/Gait Ambulation/Gait assistance: Supervision;Min guard Gait Distance (Feet): 200 Feet Assistive device: None;1 person hand held assist Gait Pattern/deviations: WFL(Within Functional Limits);Step-through pattern     General Gait Details: Pt amb 200' total with one seated rest break half way with close supervision to CGA and no AD to hand held assistance. Pt walked 100' with WFL gait pattern and no AD with close supervision fatiguing requiring one seated rest break. During second 100' interval pt required hand held assistance and CGA as gait pattern has worsened and pt appears more unsteady.   Stairs             Wheelchair Mobility    Modified Rankin (Stroke Patients Only)       Balance                                            Cognition Arousal/Alertness: Awake/alert Behavior During Therapy: WFL for tasks assessed/performed Overall Cognitive Status: Within Functional Limits for tasks assessed  Exercises Other Exercises Other Exercises: Pt instructed in and performs well seated marching, LAQ, seated heel raises, LAQ, and hip abd x15 reps each leg. Educated to perform 2x per day after d/c until beginning Rafael Hernandez Comments        Pertinent Vitals/Pain Pain Assessment: No/denies pain    Home Living                      Prior Function            PT Goals (current goals can now be found in the care plan section) Acute Rehab PT Goals Patient Stated Goal: to go home and get stronger, feel better PT Goal Formulation: With patient Time For  Goal Achievement: 05/27/18 Potential to Achieve Goals: Good Progress towards PT goals: Progressing toward goals    Frequency    Min 2X/week      PT Plan Current plan remains appropriate    Co-evaluation              AM-PAC PT "6 Clicks" Daily Activity  Outcome Measure  Difficulty turning over in bed (including adjusting bedclothes, sheets and blankets)?: None Difficulty moving from lying on back to sitting on the side of the bed? : A Little Difficulty sitting down on and standing up from a chair with arms (e.g., wheelchair, bedside commode, etc,.)?: None Help needed moving to and from a bed to chair (including a wheelchair)?: A Little Help needed walking in hospital room?: None Help needed climbing 3-5 steps with a railing? : A Little 6 Click Score: 21    End of Session Equipment Utilized During Treatment: Gait belt Activity Tolerance: Treatment limited secondary to medical complications (Comment);Patient limited by fatigue Patient left: in chair;with call bell/phone within reach;with family/visitor present Nurse Communication: Mobility status PT Visit Diagnosis: Unsteadiness on feet (R26.81);Other abnormalities of gait and mobility (R26.89);Muscle weakness (generalized) (M62.81)     Time: 0761-5183 PT Time Calculation (min) (ACUTE ONLY): 20 min  Charges:                        Yolonda Kida, SPT    Andrzej Scully 05/15/2018, 11:47 AM

## 2018-05-16 ENCOUNTER — Telehealth: Payer: Self-pay | Admitting: Family Medicine

## 2018-05-16 LAB — BASIC METABOLIC PANEL
Anion gap: 8 (ref 5–15)
BUN: 12 mg/dL (ref 8–23)
CO2: 23 mmol/L (ref 22–32)
Calcium: 8.4 mg/dL — ABNORMAL LOW (ref 8.9–10.3)
Chloride: 104 mmol/L (ref 98–111)
Creatinine, Ser: 0.96 mg/dL (ref 0.44–1.00)
GFR calc Af Amer: >60 mL/min (ref >60)
GFR calc non Af Amer: 53 mL/min — ABNORMAL LOW (ref >60)
GLUCOSE: 102 mg/dL — AB (ref 70–99)
POTASSIUM: 3.2 mmol/L — AB (ref 3.5–5.1)
Sodium: 135 mmol/L (ref 135–145)

## 2018-05-16 LAB — CBC
HEMATOCRIT: 29.3 % — AB (ref 35.0–47.0)
Hemoglobin: 10.4 g/dL — ABNORMAL LOW (ref 12.0–16.0)
MCH: 31.2 pg (ref 26.0–34.0)
MCHC: 35.4 g/dL (ref 32.0–36.0)
MCV: 88.2 fL (ref 80.0–100.0)
Platelets: 224 10*3/uL (ref 150–440)
RBC: 3.33 MIL/uL — AB (ref 3.80–5.20)
RDW: 14.3 % (ref 11.5–14.5)
WBC: 10.9 10*3/uL (ref 3.6–11.0)

## 2018-05-16 LAB — CULTURE, BLOOD (ROUTINE X 2)
Culture: NO GROWTH
Culture: NO GROWTH
Special Requests: ADEQUATE

## 2018-05-16 MED ORDER — LEVOFLOXACIN 250 MG PO TABS: 250.0000 mg | ORAL_TABLET | Freq: Every day | ORAL | 0 refills | Status: DC

## 2018-05-16 NOTE — Telephone Encounter (Signed)
Called and left voice message on patient's phone that we needed an updated DRP in order for Dr. Sanda Klein to be able to speak with her daughter. The old DRP is for [facility].

## 2018-05-16 NOTE — Progress Notes (Signed)
Patient discharge teaching given, including activity, diet, follow-up appoints, and medications. Patient verbalized understanding of all discharge instructions. IV access was d/c'd. Vitals are stable. Skin is intact except as charted in most recent assessments. Pt to be escorted out by volunteer, to be driven home by family.  Faviola Klare  

## 2018-05-16 NOTE — Discharge Summary (Signed)
[place] [facility] [place] NAME: Caitlin Jenkins    MR#:  [id number]  DATE OF BIRTH:  [birth year]  DATE OF ADMISSION:  05/11/2018 ADMITTING PHYSICIAN: Saundra Shelling, MD  DATE OF DISCHARGE: 05/16/2018  PRIMARY CARE PHYSICIAN: Arnetha Courser, MD    ADMISSION DIAGNOSIS:  Sepsis due to urinary tract infection ([place]) [A41.9, N39.0]  DISCHARGE DIAGNOSIS:  Sepsis on admission--resolved E coli UTI  SECONDARY DIAGNOSIS:   Past Medical History:  Diagnosis Date  . Biliary cirrhosis ([place])   . Cancer of skin of leg   . CKD (chronic kidney disease) stage 3, GFR 30-59 ml/min (HCC)   . Gout   . Hiatal hernia Oct 2015   8.5cm  . History of diverticulitis 2000  . Hyperlipidemia   . Hypertension   . Hypothyroidism   . Skin cancer of nose     HOSPITAL COURSE:   84 year old female with past medical history significant for cirrhosis, CKD stage III, hypertension and hypothyroidism presents to hospital secondary to fevers and chills  *sepsis: present on admission--resolved Secondary to E. coli urinary tract infection   *E. coli urinary tract infection  Levaquin for 5 to 7-day course. I tried Rocephin one time today as family was concerned about c.diff due to Levaquin but she had some itching in throat after rocephin (h/o PCN allergy) - will change it back to levaquin -afebrile for >24 hours  *COPD - stable 2 view chest x-ray noted for emphysema changes/atelectasis  Breathing treatments, antibiotics per above, inhaled corticosteroids, duo nebs as needed, increase activity/encourage ambulation   *Hypertension Stable on Norvasc, losartan  *Hypothyroidism continue Synthroid  *Biliary cirrhosis continue home medications  *Fungal nail infections Continue Lamisil cream for now and f/u with your dermatologist for further evaluation  D/w pt and dter Ambulating very well in the halls  D/c home  CONSULTS OBTAINED:    DRUG  ALLERGIES:   Allergies  Allergen Reactions  . Indomethacin Hives  . Ace Inhibitors Swelling    Other reaction(s): SWELLING Facial swelling. Facial swelling.  . Augmentin [Amoxicillin-Pot Clavulanate] Other (See Comments)    Other reaction(s): Unknown  . Penicillins Nausea And Vomiting    Has patient had a PCN reaction causing immediate rash, facial/tongue/throat swelling, SOB or lightheadedness with hypotension: Yes Has patient had a PCN reaction causing severe rash involving mucus membranes or skin necrosis: No Has patient had a PCN reaction that required hospitalization: No Has patient had a PCN reaction occurring within the last 10 years: No If all of the above answers are "NO", then may proceed with Cephalosporin use.  . Sulfur Other (See Comments)    DISCHARGE MEDICATIONS:   Allergies as of 05/16/2018      Reactions   Indomethacin Hives   Ace Inhibitors Swelling   Other reaction(s): SWELLING Facial swelling. Facial swelling.   Augmentin [amoxicillin-pot Clavulanate] Other (See Comments)   Other reaction(s): Unknown   Penicillins Nausea And Vomiting   Has patient had a PCN reaction causing immediate rash, facial/tongue/throat swelling, SOB or lightheadedness with hypotension: Yes Has patient had a PCN reaction causing severe rash involving mucus membranes or skin necrosis: No Has patient had a PCN reaction that required hospitalization: No Has patient had a PCN reaction occurring within the last 10 years: No If all of the above answers are "NO", then may proceed with Cephalosporin use.   Sulfur Other (See Comments)      Medication List    STOP taking these medications  meclizine 25 MG tablet Commonly known as:  ANTIVERT   sulfamethoxazole-trimethoprim 800-160 MG tablet Commonly known as:  BACTRIM DS,SEPTRA DS     TAKE these medications   amLODipine 5 MG tablet Commonly known as:  NORVASC Take 1 tablet (5 mg total) by mouth daily.   aspirin EC 81 MG  tablet Take 81 mg by mouth daily.   atorvastatin 10 MG tablet Commonly known as:  LIPITOR Take 1 tablet (10 mg total) by mouth at bedtime.   BIOTIN PO Take 250 mg by mouth 2 (two) times daily.   COLACE 100 MG capsule Generic drug:  docusate sodium Take 100 mg by mouth daily.   colchicine 0.6 MG tablet Take two pills at onset of gout flare, followed by one more one hour later, max of three pills per flare.   levofloxacin 250 MG tablet Commonly known as:  LEVAQUIN Take 1 tablet (250 mg total) by mouth daily.   levothyroxine 100 MCG tablet Commonly known as:  SYNTHROID, LEVOTHROID Take 1 tablet (100 mcg total) by mouth daily.   losartan 100 MG tablet Commonly known as:  COZAAR Take 1 tablet (100 mg total) by mouth daily. Avoid salt substitutes, no fruit smoothies   magnesium gluconate 500 MG tablet Commonly known as:  MAGONATE Take 500 mg by mouth daily.   niacinamide 500 MG tablet Take 1 tablet (500 mg total) by mouth 2 (two) times daily with a meal.   RA ACETAMINOPHEN 650 MG CR tablet Generic drug:  acetaminophen Take 650 mg by mouth every 12 (twelve) hours as needed.   SENIOR MULTIVITAMIN PLUS PO Take 1 tablet by mouth daily.   Turmeric 450 MG Caps Take 450 mg by mouth 2 (two) times daily.   ursodiol 300 MG capsule Commonly known as:  ACTIGALL Take 300 mg by mouth daily.   vitamin B-12 250 MCG tablet Commonly known as:  CYANOCOBALAMIN Take 250 mcg by mouth daily.   VITAMIN D-1000 MAX ST 1000 units tablet Generic drug:  Cholecalciferol Take 1,000 Units by mouth daily.       If you experience worsening of your admission symptoms, develop shortness of breath, life threatening emergency, suicidal or homicidal thoughts you must seek medical attention immediately by calling 911 or calling your MD immediately  if symptoms less severe.  You Must read complete instructions/literature along with all the possible adverse reactions/side effects for all the Medicines  you take and that have been prescribed to you. Take any new Medicines after you have completely understood and accept all the possible adverse reactions/side effects.   Please note  You were cared for by a hospitalist during your hospital stay. If you have any questions about your discharge medications or the care you received while you were in the hospital after you are discharged, you can call the unit and asked to speak with the hospitalist on call if the hospitalist that took care of you is not available. Once you are discharged, your primary care physician will handle any further medical issues. Please note that NO REFILLS for any discharge medications will be authorized once you are discharged, as it is imperative that you return to your primary care physician (or establish a relationship with a primary care physician if you do not have one) for your aftercare needs so that they can reassess your need for medications and monitor your lab values. Today   SUBJECTIVE   Doing well Walking with her dter in the hall  VITAL SIGNS:  Blood  pressure 130/68, pulse 80, temperature 98.2 F (36.8 C), temperature source Oral, resp. rate 20, height 5\' 4"  (1.626 m), weight 70.3 kg, SpO2 100 %.  I/O:    Intake/Output Summary (Last 24 hours) at 05/16/2018 1030 Last data filed at 05/16/2018 0919 Gross per 24 hour  Intake 220 ml  Output 2700 ml  Net -2480 ml    PHYSICAL EXAMINATION:  GENERAL:  84 y.o.-year-old patient lying in the bed with no acute distress.  EYES: Pupils equal, round, reactive to light and accommodation. No scleral icterus. Extraocular muscles intact.  HEENT: Head atraumatic, normocephalic. Oropharynx and nasopharynx clear.  NECK:  Supple, no jugular venous distention. No thyroid enlargement, no tenderness.  LUNGS: Normal breath sounds bilaterally, no wheezing, rales,rhonchi or crepitation. No use of accessory muscles of respiration.  CARDIOVASCULAR: S1, S2 normal. No murmurs, rubs,  or gallops.  ABDOMEN: Soft, non-tender, non-distended. Bowel sounds present. No organomegaly or mass.  EXTREMITIES: No pedal edema, cyanosis, or clubbing.  NEUROLOGIC: Cranial nerves II through XII are intact. Muscle strength 5/5 in all extremities. Sensation intact. Gait not checked.  PSYCHIATRIC: The patient is alert and oriented x 3.  SKIN: No obvious rash, lesion, or ulcer.   DATA REVIEW:   CBC  Recent Labs  Lab 05/16/18 0259  WBC 10.9  HGB 10.4*  HCT 29.3*  PLT 224    Chemistries  Recent Labs  Lab 05/11/18 1715  05/16/18 0259  NA 133*   < > 135  K 4.4   < > 3.2*  CL 100   < > 104  CO2 22   < > 23  GLUCOSE 96   < > 102*  BUN 29*   < > 12  CREATININE 1.52*   < > 0.96  CALCIUM 9.6   < > 8.4*  AST 34  --   --   ALT 11  --   --   ALKPHOS 87  --   --   BILITOT 1.2  --   --    < > = values in this interval not displayed.    Microbiology Results   Recent Results (from the past 240 hour(s))  Urine Culture     Status: Abnormal   Collection Time: 05/11/18 12:04 PM  Result Value Ref Range Status   MICRO NUMBER: [id number]  Final   SPECIMEN QUALITY: ADEQUATE  Final   Sample Source URINE  Final   STATUS: FINAL  Final   ISOLATE 1: Escherichia coli (A)  Final    Comment: Greater than 100,000 CFU/mL of Escherichia coli      Susceptibility   Escherichia coli - URINE CULTURE, REFLEX    AMOX/CLAVULANIC <=2 Sensitive     AMPICILLIN <=2 Sensitive     AMPICILLIN/SULBACTAM <=2 Sensitive     CEFAZOLIN* <=4 Not Reportable      * For infections other than uncomplicated UTIcaused by E. coli, K. pneumoniae or P. mirabilis:Cefazolin is resistant if MIC > or = 8 mcg/mL.(Distinguishing susceptible versus intermediatefor isolates with MIC < or = 4 mcg/mL requiresadditional testing.)For uncomplicated UTI caused by E. coli,K. pneumoniae or P. mirabilis: Cefazolin issusceptible if MIC <32 mcg/mL and predictssusceptible to the oral agents cefaclor, cefdinir,cefpodoxime, cefprozil, cefuroxime,  cephalexinand loracarbef.    CEFEPIME <=1 Sensitive     CEFTRIAXONE <=1 Sensitive     CIPROFLOXACIN <=0.25 Sensitive     LEVOFLOXACIN <=0.12 Sensitive     ERTAPENEM <=0.5 Sensitive     GENTAMICIN <=1 Sensitive     IMIPENEM <=0.25 Sensitive  NITROFURANTOIN <=16 Sensitive     PIP/TAZO <=4 Sensitive     TOBRAMYCIN <=1 Sensitive     TRIMETH/SULFA* <=20 Sensitive      * For infections other than uncomplicated UTIcaused by E. coli, K. pneumoniae or P. mirabilis:Cefazolin is resistant if MIC > or = 8 mcg/mL.(Distinguishing susceptible versus intermediatefor isolates with MIC < or = 4 mcg/mL requiresadditional testing.)For uncomplicated UTI caused by E. coli,K. pneumoniae or P. mirabilis: Cefazolin issusceptible if MIC <32 mcg/mL and predictssusceptible to the oral agents cefaclor, cefdinir,cefpodoxime, cefprozil, cefuroxime, cephalexinand loracarbef.Legend:S = Susceptible  I = IntermediateR = Resistant  NS = Not susceptible* = Not tested  NR = Not reported**NN = See antimicrobic comments  Blood Culture (routine x 2)     Status: None   Collection Time: 05/11/18  5:16 PM  Result Value Ref Range Status   Specimen Description BLOOD RT HAND  Final   Special Requests   Final    BOTTLES DRAWN AEROBIC AND ANAEROBIC Blood Culture results may not be optimal due to an inadequate volume of blood received in culture bottles   Culture   Final    NO GROWTH 5 DAYS Performed at [facility], [address] [place], [place] [address]    Report Status 05/16/2018 FINAL  Final  Blood Culture (routine x 2)     Status: None   Collection Time: 05/11/18  8:09 PM  Result Value Ref Range Status   Specimen Description BLOOD RIGHT ANTECUBITAL  Final   Special Requests   Final    BOTTLES DRAWN AEROBIC AND ANAEROBIC Blood Culture adequate volume   Culture   Final    NO GROWTH 5 DAYS Performed at [facility], [address], [place], [place] [address]    Report Status 05/16/2018 FINAL  Final     RADIOLOGY:  No results found.   Management plans discussed with the patient, family and they are in agreement.  CODE STATUS:     Code Status Orders  (From admission, onward)         Start     Ordered   05/11/18 2004  Full code  Continuous     05/11/18 2004        Code Status History    This patient has a current code status but no historical code status.    Advance Directive Documentation     Most Recent Value  Type of Advance Directive  Living will, Healthcare Power of Attorney  Pre-existing out of facility DNR order (yellow form or pink MOST form)  -  "MOST" Form in Place?  -      TOTAL TIME TAKING CARE OF THIS PATIENT: 40 minutes.    Fritzi Mandes M.D on 05/16/2018 at 10:30 AM  Between 7am to 6pm - Pager - [phone] After 6pm go to [email or web address] - password [email or web address] [facility]  Office  [phone]  CC: Primary care physician; Arnetha Courser, MD

## 2018-05-16 NOTE — Care Management Note (Signed)
Case Management Note  Patient Details  Name: Caitlin Jenkins MRN: [id number] Date of Birth: Jun 09, 1932  Subjective/Objective:                 Physical therapy has recommended home health physical therapy.  Patient in agreement. No agency preference.  Referral to [facility]   Action/Plan:   Expected Discharge Date:  05/16/18               Expected Discharge Plan:     In-House Referral:     Discharge planning Services     Post Acute Care Choice:  Home Health Choice offered to:  Patient  DME Arranged:    DME Agency:     HH Arranged:  PT [place]:  [address]  Status of Service:  Completed, signed off  If discussed at [place] of Stay Meetings, dates discussed:    Additional Comments:  Katrina Stack, RN 05/16/2018, 6:29 PM

## 2018-05-16 NOTE — Telephone Encounter (Signed)
Spoke with pt and appt has been switched.

## 2018-05-16 NOTE — Plan of Care (Signed)
Patient is ambulating in the hallway without difficulty and has been afebrile throughout the day without presence of chills.  Caitlin Jenkins

## 2018-05-16 NOTE — Care Management Important Message (Signed)
Copy of signed IM left with patient in room.  

## 2018-05-16 NOTE — Telephone Encounter (Signed)
Please move patient's hospital discharge follow-up to Friday with me. I'm willing to see her during lunch. Thank you

## 2018-05-17 ENCOUNTER — Telehealth: Payer: Self-pay

## 2018-05-17 ENCOUNTER — Telehealth: Payer: Self-pay | Admitting: Family Medicine

## 2018-05-17 DIAGNOSIS — K743 Primary biliary cirrhosis: Secondary | ICD-10-CM | POA: Diagnosis not present

## 2018-05-17 DIAGNOSIS — B351 Tinea unguium: Secondary | ICD-10-CM | POA: Diagnosis not present

## 2018-05-17 DIAGNOSIS — E039 Hypothyroidism, unspecified: Secondary | ICD-10-CM | POA: Diagnosis not present

## 2018-05-17 DIAGNOSIS — Z85828 Personal history of other malignant neoplasm of skin: Secondary | ICD-10-CM | POA: Diagnosis not present

## 2018-05-17 DIAGNOSIS — N183 Chronic kidney disease, stage 3 (moderate): Secondary | ICD-10-CM | POA: Diagnosis not present

## 2018-05-17 DIAGNOSIS — E785 Hyperlipidemia, unspecified: Secondary | ICD-10-CM | POA: Diagnosis not present

## 2018-05-17 DIAGNOSIS — M109 Gout, unspecified: Secondary | ICD-10-CM | POA: Diagnosis not present

## 2018-05-17 DIAGNOSIS — I129 Hypertensive chronic kidney disease with stage 1 through stage 4 chronic kidney disease, or unspecified chronic kidney disease: Secondary | ICD-10-CM | POA: Diagnosis not present

## 2018-05-17 DIAGNOSIS — Z792 Long term (current) use of antibiotics: Secondary | ICD-10-CM | POA: Diagnosis not present

## 2018-05-17 DIAGNOSIS — J449 Chronic obstructive pulmonary disease, unspecified: Secondary | ICD-10-CM | POA: Diagnosis not present

## 2018-05-17 DIAGNOSIS — N390 Urinary tract infection, site not specified: Secondary | ICD-10-CM | POA: Diagnosis not present

## 2018-05-17 DIAGNOSIS — B9620 Unspecified Escherichia coli [E. coli] as the cause of diseases classified elsewhere: Secondary | ICD-10-CM | POA: Diagnosis not present

## 2018-05-17 NOTE — Telephone Encounter (Signed)
Transition Care Management Follow-up Telephone Call  Date of discharge and from where: 05/16/18 from [facility]  How have you been since you were released from the hospital? States she is feeling much better with the exception of frequent trips to the bathroom at night. States she did not get much sleep last night but overall is feeling better. Further added that she "went to the beauty parlor today" and "feels like a new woman".  Any questions or concerns? No   Items Reviewed:  Did the pt receive and understand the discharge instructions provided? Yes   Medications obtained and verified? Yes   Any new allergies since your discharge? No   Dietary orders reviewed? Yes  Do you have support at home? Yes   Other (ie: DME, Home Health, etc) Home health PT  Functional Questionnaire: (I = Independent and D = Dependent) ADL's: I  Bathing/Dressing- I   Meal Prep- I  Eating- I  Maintaining continence- I  Transferring/Ambulation- I  Managing Meds- I   Follow up appointments reviewed:    PCP Hospital f/u appt confirmed? Yes  Scheduled to see Dr. Sanda Klein on 05/18/18 @ 10:00am.  [facility] f/u appt confirmed? N/A  Are transportation arrangements needed? No   If their condition worsens, is the pt aware to call  their PCP or go to the ED? Yes  Was the patient provided with contact information for the PCP's office or ED? Yes  Was the pt encouraged to call back with questions or concerns? Yes

## 2018-05-17 NOTE — Telephone Encounter (Signed)
Copied from [place] [id number]. Topic: Quick Communication - See Telephone Encounter >> May 17, 2018  4:42 PM Vernona Rieger wrote: CRM for notification. See Telephone encounter for: 05/17/18.  Raquel Sarna, physical therapy with [facility] called for orders to see the patient for physical therapy for once a week for one week, twice a week for one week and once a week for one week. Call back # [phone]

## 2018-05-18 ENCOUNTER — Ambulatory Visit (INDEPENDENT_AMBULATORY_CARE_PROVIDER_SITE_OTHER): Payer: PPO | Admitting: Family Medicine

## 2018-05-18 ENCOUNTER — Encounter: Payer: Self-pay | Admitting: Family Medicine

## 2018-05-18 ENCOUNTER — Telehealth: Payer: Self-pay | Admitting: Nurse Practitioner

## 2018-05-18 VITALS — BP 122/62 | HR 91 | Temp 97.4°F | Ht 64.0 in | Wt 159.6 lb

## 2018-05-18 DIAGNOSIS — R531 Weakness: Secondary | ICD-10-CM | POA: Diagnosis not present

## 2018-05-18 DIAGNOSIS — N390 Urinary tract infection, site not specified: Secondary | ICD-10-CM | POA: Diagnosis not present

## 2018-05-18 DIAGNOSIS — N183 Chronic kidney disease, stage 3 unspecified: Secondary | ICD-10-CM

## 2018-05-18 DIAGNOSIS — L309 Dermatitis, unspecified: Secondary | ICD-10-CM

## 2018-05-18 DIAGNOSIS — B9620 Unspecified Escherichia coli [E. coli] as the cause of diseases classified elsewhere: Secondary | ICD-10-CM | POA: Diagnosis not present

## 2018-05-18 DIAGNOSIS — Z09 Encounter for follow-up examination after completed treatment for conditions other than malignant neoplasm: Secondary | ICD-10-CM | POA: Diagnosis not present

## 2018-05-18 DIAGNOSIS — D649 Anemia, unspecified: Secondary | ICD-10-CM

## 2018-05-18 DIAGNOSIS — A4151 Sepsis due to Escherichia coli [E. coli]: Secondary | ICD-10-CM | POA: Diagnosis not present

## 2018-05-18 DIAGNOSIS — E876 Hypokalemia: Secondary | ICD-10-CM | POA: Diagnosis not present

## 2018-05-18 MED ORDER — AMLODIPINE BESYLATE 5 MG PO TABS: 2.5000 mg | ORAL_TABLET | Freq: Every day | ORAL | Status: DC

## 2018-05-18 MED ORDER — POTASSIUM CHLORIDE ER 10 MEQ PO TBCR: 10.0000 meq | EXTENDED_RELEASE_TABLET | Freq: Every day | ORAL | 0 refills | Status: DC

## 2018-05-18 NOTE — Telephone Encounter (Signed)
Notes from labs drawn on 8/16 - Uric acid mildly increased, patient is asymptomatic, please take colchicine if having pain with gout flare, avoid high-purine foods; drink plenty of water.  - Kidney function continuing to decrease- please continue drinking water. Has improved  In labs post-hospitalization this was likely to due to acute infection.  - CBC was elevated and why she was sent to hospital- much improved now.

## 2018-05-18 NOTE — Telephone Encounter (Signed)
That's fine I saw patient today for face-to-face Please ask them to tell us exactly what their diagnoses are so I can document in my note appropriately

## 2018-05-18 NOTE — Progress Notes (Signed)
BP 122/62   Pulse 91   Temp (!) 97.4 F (36.3 C)   Ht 5\' 4"  (1.626 m)   Wt 159 lb 9.6 oz (72.4 kg)   SpO2 97%   BMI 27.40 kg/m    Subjective:    Patient ID: Caitlin Jenkins, female    DOB: 1932-05-05, 84 y.o.   MRN: [id number]  HPI: Caitlin Jenkins is a 84 y.o. female  Chief Complaint  Patient presents with  . Hospitalization Follow-up    UTI- Sepsis    HPI Hospitalized from August 16th to aug 21st; I visited her socially in the hospital; she was originally septic, with temperature of 103.3 degrees F and white blood cell count up to 23.1k; her lactic acid was elevated; labs reviewed with patient and daughter  Her culture grew out E coli UTI; pan-sensitive; she was sent home on Levaquin  Rash on the lower back; no blisters; wonders if that was from one of the antibiotics in the hospital or something else; she was sweating in her lower back when laying down  Low potassium; eats watermelon; can eat bananas Creatinine improved and normal by discharge H/H; mild anemia, trending down; drawing lots of blood in the hospital No blood in the stool No visible blood in the urine, but large amount in th ehospital urine specimen Stools cards neg x 3 just recently GFR > 60; seeing Dr. Juleen China  Two CXR reviewed, note to radiologist as there is a difference of documentation; one appears to suggest atelectasis or infiltrate in the impression, but clear in the description above; she is having a little cough; had to have breathing treatments in the hospital; there was some hyperexpansion of lungs on CXR, but no known hx of COPD   Depression screen [facility] 2/9 05/18/2018 05/11/2018 04/04/2018 03/02/2018 04/04/2017  Decreased Interest 0 0 0 0 0  Down, Depressed, Hopeless 0 0 0 0 0  PHQ - 2 Score 0 0 0 0 0  Altered sleeping - - - 0 -  Tired, decreased energy - - - 0 -  Change in appetite - - - 0 -  Feeling bad or failure about yourself  - - - 0 -  Trouble concentrating - - - 0 -  Moving  slowly or fidgety/restless - - - 0 -  Suicidal thoughts - - - 0 -  PHQ-9 Score - - - 0 -  Difficult doing work/chores - - - Not difficult at all -    Relevant past medical, surgical, family and social history reviewed Past Medical History:  Diagnosis Date  . Biliary cirrhosis ([place])   . Cancer of skin of leg   . CKD (chronic kidney disease) stage 3, GFR 30-59 ml/min (HCC)   . Gout   . Hiatal hernia Oct 2015   8.5cm  . History of diverticulitis 2000  . Hyperlipidemia   . Hypertension   . Hypothyroidism   . Skin cancer of nose    Past Surgical History:  Procedure Laterality Date  . ABDOMINAL HYSTERECTOMY  1995  . CATARACT EXTRACTION    . FOOT SURGERY  2000  . HEMORRHOID SURGERY    . THYROID SURGERY  1999   para thyroid   Family History  Problem Relation Age of Onset  . Emphysema Father   . Asthma Father   . Hypertension Daughter   . Heart disease Daughter        3 stents  . Diabetes Daughter   . Hypertension Son   .  Cancer Son        prostate  . Diabetes Son   . Heart disease Daughter        heart attack, 2 stents  . Multiple sclerosis Daughter   . Diabetes Daughter   . Fibromyalgia Daughter   . Diabetes Daughter    Social History   Tobacco Use  . Smoking status: Never Smoker  . Smokeless tobacco: Never Used  . Tobacco comment: smoking cessation materials not required  Substance Use Topics  . Alcohol use: No  . Drug use: No    Interim medical history since last visit reviewed. Allergies and medications reviewed  Review of Systems Per HPI unless specifically indicated above     Objective:    BP 122/62   Pulse 91   Temp (!) 97.4 F (36.3 C)   Ht 5\' 4"  (1.626 m)   Wt 159 lb 9.6 oz (72.4 kg)   SpO2 97%   BMI 27.40 kg/m   Wt Readings from Last 3 Encounters:  05/18/18 159 lb 9.6 oz (72.4 kg)  05/11/18 155 lb (70.3 kg)  05/11/18 159 lb 4.8 oz (72.3 kg)    Physical Exam  Constitutional: She appears well-developed and well-nourished. No  distress.  HENT:  Head: Normocephalic and atraumatic.  Eyes: EOM are normal. No scleral icterus.  Neck: No thyromegaly present.  Cardiovascular: Normal rate, regular rhythm and normal heart sounds.  No murmur heard. Pulmonary/Chest: Effort normal and breath sounds normal. No respiratory distress. She has no wheezes.  Abdominal: Soft. Bowel sounds are normal. She exhibits no distension. There is no CVA tenderness.  Musculoskeletal: She exhibits no edema.  Neurological: She is alert.  Skin: Skin is warm and dry. Rash (very minimal papular erythematous rash on the lower back in distribution c/w sweating in hospital bed when febrile; no vesicles) noted. She is not diaphoretic. No pallor.  Psychiatric: She has a normal mood and affect. Her behavior is normal. Judgment and thought content normal.       Assessment & Plan:   Problem List Items Addressed This Visit      Genitourinary   CKD (chronic kidney disease) stage 3, GFR 30-59 ml/min (HCC) (Chronic)    Reviewed Cr and GFR values during her hospital stay; surprisingly, her GFR has risen to over 60; we both believe that her nephrologist will be thrilled       Other Visit Diagnoses    Hospital discharge follow-up    -  Primary   hospital records reviewed in detail with patient and her daughter; medicines reconciled; condition improving   Urinary tract infection, E. coli       finish out antibiotics; recheck urine   Relevant Orders   Urinalysis w microscopic + reflex cultur (Completed)   Sepsis due to Escherichia coli (E. coli) (HCC)       improving; fever, lactate, and WBC all improving / resolved   Anemia, unspecified type       note to nephrologist to see if this could be anemia secondary to her CKD; she has already done stool cards   Weakness       related to sepsis, UTI; okay for PT and OT to work with patient to help her regain her strength post discharge   Hypokalemia       short-term supplementation   Dermatitis       lower  back; while in hospital with 103+ fever; I believe this is c/w sweating and heat; should resolve on its own;  not related to antibiotic use       Follow up plan: Return in about 3 weeks (around 06/08/2018) for follow-up visit with Dr. Sanda Klein.  An after-visit summary was printed and given to the patient at [place].  Please see the patient instructions which may contain other information and recommendations beyond what is mentioned above in the assessment and plan.  Meds ordered this encounter  Medications  . potassium chloride (KLOR-CON 10) 10 MEQ tablet    Sig: Take 1 tablet (10 mEq total) by mouth daily.    Dispense:  4 tablet    Refill:  0  . amLODipine (NORVASC) 5 MG tablet    Sig: Take 0.5 tablets (2.5 mg total) by mouth daily.    Orders Placed This Encounter  Procedures  . Urine Culture  . Urinalysis w microscopic + reflex cultur  . REFLEXIVE URINE CULTURE   Sepsis, blood cx neg x 2 CXR; want readings clarified; lungs are clear Anemia; asking kidney doctor if anemia related to CKD Low K suppl Rx CKD improved, under 30 and now over 60 On levaquin

## 2018-05-18 NOTE — Telephone Encounter (Signed)
Notified. 

## 2018-05-18 NOTE — Telephone Encounter (Signed)
Left detailed message and to call back with diagnoses.

## 2018-05-18 NOTE — Telephone Encounter (Signed)
Pt diagnosis is Sepsis from a UTI.

## 2018-05-18 NOTE — Patient Instructions (Addendum)
Try the Mucinex (guaifenesin) long-acting 600 mg one pill twice a day to help loosen up the phlegm Get the plain, NOT the D or DM Stay well-hydrated Decrease the amlodipine to 2.5 mg daily, but go back to 5 mg if it trends up over 140 on top We'll see what the radiologist says about the lung xray We'll see what Dr. Juleen China says about the anemia

## 2018-05-20 LAB — URINE CULTURE
MICRO NUMBER: 91014273
RESULT: NO GROWTH
SPECIMEN QUALITY:: ADEQUATE

## 2018-05-20 LAB — URINALYSIS W MICROSCOPIC + REFLEX CULTURE
Bilirubin Urine: NEGATIVE
GLUCOSE, UA: NEGATIVE
Hgb urine dipstick: NEGATIVE
Ketones, ur: NEGATIVE
Nitrites, Initial: NEGATIVE
PH: 6 (ref 5.0–8.0)
SPECIFIC GRAVITY, URINE: 1.014 (ref 1.001–1.03)

## 2018-05-20 LAB — CULTURE INDICATED

## 2018-05-21 NOTE — Assessment & Plan Note (Signed)
Reviewed Cr and GFR values during her hospital stay; surprisingly, her GFR has risen to over 60; we both believe that her nephrologist will be thrilled

## 2018-05-22 ENCOUNTER — Ambulatory Visit: Payer: PPO | Admitting: Nurse Practitioner

## 2018-06-03 ENCOUNTER — Telehealth: Payer: Self-pay | Admitting: Family Medicine

## 2018-06-03 NOTE — Telephone Encounter (Signed)
-----   Message from Misty Stanley, MD sent at 05/21/2018  4:25 PM EDT ----- Regarding: RE: Please let at xray from 05/14/18 Yes, sorry for the confusion!  Looks like an additional sentence was inserted into the Impression paragraph that should not be there.  There is no pneumonia.  I have addended the report.  Randall Hiss ----- Message ----- From: Arnetha Courser, MD Sent: 05/18/2018  10:37 AM EDT To: Misty Stanley, MD Subject: Please let at xray from 05/14/18                Greetings! I'm seeing patient in hospital f/u. Could you please clarify the second CXR? Above, says no infiltrate. Lower says infiltrate or atelectasis. Thank you! Rip Harbour

## 2018-06-03 NOTE — Telephone Encounter (Signed)
Caitlin Jenkins, please let the patient know that the radiologist wrote back Sorry for the delay, as I've been gone for two weeks Let her know that the last CXR did NOT show any pneumonia Thank you

## 2018-06-04 ENCOUNTER — Telehealth: Payer: Self-pay

## 2018-06-04 DIAGNOSIS — E890 Postprocedural hypothyroidism: Secondary | ICD-10-CM

## 2018-06-04 DIAGNOSIS — M1A0610 Idiopathic chronic gout, right knee, without tophus (tophi): Secondary | ICD-10-CM

## 2018-06-04 DIAGNOSIS — E782 Mixed hyperlipidemia: Secondary | ICD-10-CM

## 2018-06-04 DIAGNOSIS — N183 Chronic kidney disease, stage 3 unspecified: Secondary | ICD-10-CM

## 2018-06-04 DIAGNOSIS — K743 Primary biliary cirrhosis: Secondary | ICD-10-CM

## 2018-06-04 DIAGNOSIS — D649 Anemia, unspecified: Secondary | ICD-10-CM

## 2018-06-04 NOTE — Telephone Encounter (Signed)
Patient wants to get labs done before appt, please order for [facility]

## 2018-06-04 NOTE — Telephone Encounter (Signed)
Left detailed voicemail

## 2018-06-06 NOTE — Telephone Encounter (Signed)
Orders are ready

## 2018-06-08 ENCOUNTER — Encounter: Payer: Self-pay | Admitting: Family Medicine

## 2018-06-12 ENCOUNTER — Other Ambulatory Visit: Payer: Self-pay

## 2018-06-12 DIAGNOSIS — D649 Anemia, unspecified: Secondary | ICD-10-CM

## 2018-06-12 DIAGNOSIS — E890 Postprocedural hypothyroidism: Secondary | ICD-10-CM

## 2018-06-12 DIAGNOSIS — K743 Primary biliary cirrhosis: Secondary | ICD-10-CM

## 2018-06-12 DIAGNOSIS — M1A0610 Idiopathic chronic gout, right knee, without tophus (tophi): Secondary | ICD-10-CM

## 2018-06-12 DIAGNOSIS — N183 Chronic kidney disease, stage 3 unspecified: Secondary | ICD-10-CM

## 2018-06-12 DIAGNOSIS — E790 Hyperuricemia without signs of inflammatory arthritis and tophaceous disease: Secondary | ICD-10-CM | POA: Diagnosis not present

## 2018-06-12 LAB — COMPLETE METABOLIC PANEL WITH GFR
AG Ratio: 1.3 (calc) (ref 1.0–2.5)
ALBUMIN MSPROF: 4 g/dL (ref 3.6–5.1)
ALKALINE PHOSPHATASE (APISO): 103 U/L (ref 33–130)
ALT: 9 U/L (ref 6–29)
AST: 22 U/L (ref 10–35)
BUN / CREAT RATIO: 17 (calc) (ref 6–22)
BUN: 21 mg/dL (ref 7–25)
CO2: 27 mmol/L (ref 20–32)
CREATININE: 1.23 mg/dL — AB (ref 0.60–0.88)
Calcium: 10 mg/dL (ref 8.6–10.4)
Chloride: 100 mmol/L (ref 98–110)
GFR, Est African American: 47 mL/min/{1.73_m2} — ABNORMAL LOW (ref >=60)
GFR, Est Non African American: 40 mL/min/{1.73_m2} — ABNORMAL LOW (ref >=60)
GLUCOSE: 98 mg/dL (ref 65–139)
Globulin: 3.2 g/dL (calc) (ref 1.9–3.7)
Potassium: 5.1 mmol/L (ref 3.5–5.3)
Sodium: 135 mmol/L (ref 135–146)
Total Bilirubin: 0.6 mg/dL (ref 0.2–1.2)
Total Protein: 7.2 g/dL (ref 6.1–8.1)

## 2018-06-12 LAB — CBC WITH DIFFERENTIAL/PLATELET
Basophils Absolute: 40 cells/uL (ref 0–200)
Basophils Relative: 0.5 %
EOS ABS: 490 {cells}/uL (ref 15–500)
EOS PCT: 6.2 %
HCT: 34.7 % — ABNORMAL LOW (ref 35.0–45.0)
HEMOGLOBIN: 12 g/dL (ref 11.7–15.5)
Lymphs Abs: 3136 cells/uL (ref 850–3900)
MCH: 30.3 pg (ref 27.0–33.0)
MCHC: 34.6 g/dL (ref 32.0–36.0)
MCV: 87.6 fL (ref 80.0–100.0)
MONOS PCT: 8.4 %
MPV: 8.7 fL (ref 7.5–12.5)
NEUTROS ABS: 3571 {cells}/uL (ref 1500–7800)
Neutrophils Relative %: 45.2 %
Platelets: 262 10*3/uL (ref 140–400)
RBC: 3.96 10*6/uL (ref 3.80–5.10)
RDW: 13.6 % (ref 11.0–15.0)
Total Lymphocyte: 39.7 %
WBC mixed population: 664 cells/uL (ref 200–950)
WBC: 7.9 10*3/uL (ref 3.8–10.8)

## 2018-06-12 LAB — URIC ACID: URIC ACID, SERUM: 8.2 mg/dL — AB (ref 2.5–7.0)

## 2018-06-12 LAB — TSH: TSH: 0.39 m[IU]/L — AB (ref 0.40–4.50)

## 2018-06-13 ENCOUNTER — Encounter: Payer: Self-pay | Admitting: Family Medicine

## 2018-06-13 ENCOUNTER — Ambulatory Visit (INDEPENDENT_AMBULATORY_CARE_PROVIDER_SITE_OTHER): Payer: PPO | Admitting: Family Medicine

## 2018-06-13 VITALS — BP 138/84 | HR 92 | Temp 98.1°F | Ht 64.0 in | Wt 158.1 lb

## 2018-06-13 DIAGNOSIS — E782 Mixed hyperlipidemia: Secondary | ICD-10-CM | POA: Diagnosis not present

## 2018-06-13 DIAGNOSIS — I10 Essential (primary) hypertension: Secondary | ICD-10-CM

## 2018-06-13 DIAGNOSIS — N183 Chronic kidney disease, stage 3 unspecified: Secondary | ICD-10-CM

## 2018-06-13 DIAGNOSIS — E890 Postprocedural hypothyroidism: Secondary | ICD-10-CM | POA: Diagnosis not present

## 2018-06-13 DIAGNOSIS — K743 Primary biliary cirrhosis: Secondary | ICD-10-CM

## 2018-06-13 DIAGNOSIS — Z5181 Encounter for therapeutic drug level monitoring: Secondary | ICD-10-CM | POA: Diagnosis not present

## 2018-06-13 DIAGNOSIS — Z23 Encounter for immunization: Secondary | ICD-10-CM

## 2018-06-13 DIAGNOSIS — M1A0610 Idiopathic chronic gout, right knee, without tophus (tophi): Secondary | ICD-10-CM | POA: Diagnosis not present

## 2018-06-13 MED ORDER — COLCHICINE 0.6 MG PO TABS: ORAL_TABLET | ORAL | 3 refills | Status: DC

## 2018-06-13 NOTE — Assessment & Plan Note (Signed)
Managed by GI; stable, chronic condition

## 2018-06-13 NOTE — Progress Notes (Signed)
BP 138/84   Pulse 92   Temp 98.1 F (36.7 C)   Ht 5\' 4"  (1.626 m)   Wt 158 lb 1.6 oz (71.7 kg)   SpO2 99%   BMI 27.14 kg/m    Subjective:    Patient ID: Caitlin Jenkins, female    DOB: Jul 23, 1932, 84 y.o.   MRN: [id number]  HPI: Caitlin Jenkins is a 84 y.o. female  Chief Complaint  Patient presents with  . Follow-up    HPI HTN well-controlled on ARB and CCB  Low TSH; not feeling anxious; not losing weight  Uric acid 8.2; she thinks it is just her body; just high all the time; just a tiny bit six months ago, before that a year before; no chicken stock, rarely eats red meat, not usually gravy; rarely eats shrimp; no liver; steak every 2-3 months; no alcohol but just on a cruise on special occasion; gout moves around and can be anywhere; feet, knees, hips, or hands  CKD stage 3, stable; no NSAIDs at all; sees Dr. Juleen China soon; working on being a good water drinker  Started on [employer] when leaving the hospital; used half of a box, got up so much stuff  Tetanus if she gets injured  DEXA scan is due, but patient politely declined; taking calcium, good fall precautions  Ears have wax again  Taking biotin and helping; taking magnesium but will stop since eating variety of foods; taking turmeric for arthritis  Stopping aspirin after discussion of risks and benefits  Taking colace for constipation; thinking about every other day; she'll increase water and fiber  PBC; seeing GI; taking med  Depression screen [facility] 2/9 06/13/2018 05/18/2018 05/11/2018 04/04/2018 03/02/2018  Decreased Interest 0 0 0 0 0  Down, Depressed, Hopeless 0 0 0 0 0  PHQ - 2 Score 0 0 0 0 0  Altered sleeping - - - - 0  Tired, decreased energy - - - - 0  Change in appetite - - - - 0  Feeling bad or failure about yourself  - - - - 0  Trouble concentrating - - - - 0  Moving slowly or fidgety/restless - - - - 0  Suicidal thoughts - - - - 0  PHQ-9 Score - - - - 0  Difficult doing work/chores - - - - Not  difficult at all    Relevant past medical, surgical, family and social history reviewed Past Medical History:  Diagnosis Date  . Biliary cirrhosis ([place])   . Cancer of skin of leg   . CKD (chronic kidney disease) stage 3, GFR 30-59 ml/min (HCC)   . Gout   . Hiatal hernia Oct 2015   8.5cm  . History of diverticulitis 2000  . Hyperlipidemia   . Hypertension   . Hypothyroidism   . Skin cancer of nose    Past Surgical History:  Procedure Laterality Date  . ABDOMINAL HYSTERECTOMY  1995  . CATARACT EXTRACTION    . FOOT SURGERY  2000  . HEMORRHOID SURGERY    . THYROID SURGERY  1999   para thyroid   Family History  Problem Relation Age of Onset  . Emphysema Father   . Asthma Father   . Hypertension Daughter   . Heart disease Daughter        3 stents  . Diabetes Daughter   . Hypertension Son   . Cancer Son        prostate  . Diabetes Son   .  Heart disease Daughter        heart attack, 2 stents  . Multiple sclerosis Daughter   . Diabetes Daughter   . Fibromyalgia Daughter   . Diabetes Daughter    Social History   Tobacco Use  . Smoking status: Never Smoker  . Smokeless tobacco: Never Used  . Tobacco comment: smoking cessation materials not required  Substance Use Topics  . Alcohol use: No  . Drug use: No    Interim medical history since last visit reviewed. Allergies and medications reviewed  Review of Systems Per HPI unless specifically indicated above     Objective:    BP 138/84   Pulse 92   Temp 98.1 F (36.7 C)   Ht 5\' 4"  (1.626 m)   Wt 158 lb 1.6 oz (71.7 kg)   SpO2 99%   BMI 27.14 kg/m   Wt Readings from Last 3 Encounters:  06/13/18 158 lb 1.6 oz (71.7 kg)  05/18/18 159 lb 9.6 oz (72.4 kg)  05/11/18 155 lb (70.3 kg)    Physical Exam  Constitutional: She appears well-developed and well-nourished. No distress.  HENT:  Head: Normocephalic and atraumatic.  Right Ear: Tympanic membrane and ear canal normal. Decreased hearing is noted.  Left  Ear: Tympanic membrane and ear canal normal. Decreased hearing is noted.  Eyes: EOM are normal. No scleral icterus.  Neck: No thyromegaly present.  Cardiovascular: Normal rate, regular rhythm and normal heart sounds.  No murmur heard. Pulmonary/Chest: Effort normal and breath sounds normal. No respiratory distress. She has no wheezes.  Abdominal: Soft. Bowel sounds are normal. She exhibits no distension.  Musculoskeletal: She exhibits no edema.  Neurological: She is alert.  Skin: Skin is warm and dry. She is not diaphoretic. No pallor.  Psychiatric: She has a normal mood and affect. Her behavior is normal. Judgment and thought content normal.      Assessment & Plan:   Problem List Items Addressed This Visit      Cardiovascular and Mediastinum   Essential hypertension, benign - Primary (Chronic)    Chronic problem; controlled; continue meds        Digestive   Primary biliary cholangitis (HCC) (Chronic)    Managed by GI; stable, chronic condition        Endocrine   Hypothyroidism (Chronic)    TSH just one hundredth of a point off, but patient feels fine; will just recheck in 3 months, but call sooner if jittery, weight loss, etc      Relevant Orders   TSH     Genitourinary   CKD (chronic kidney disease) stage 3, GFR 30-59 ml/min (HCC) (Chronic)    Seeing nephrologist; reviewed GFR and Cr with her today; avoid NSAIDs; stay well-hydrated        Other   Medication monitoring encounter    Check liver and kidneys every 3-6 months      Relevant Orders   COMPLETE METABOLIC PANEL WITH GFR   Hyperlipidemia (Chronic)    Continue statin; do NOT take statin for 3 days if taking colchicine; check lipid panel in 3 months      Relevant Orders   Lipid panel   Gout (Chronic)    We discussed dietary recommendations; I believe her uric acid is staying elevated b/c of her CKD; she may talk to her nephrologist for other ideas      Relevant Orders   Uric acid    Other Visit  Diagnoses    Need for influenza vaccination  Relevant Orders   Flu vaccine HIGH DOSE PF (Fluzone High dose) (Completed)       Follow up plan: Return in about 3 months (around 09/14/2018) for follow-up visit with Dr. Sanda Klein with labs one day prior.  An after-visit summary was printed and given to the patient at [facility].  Please see the patient instructions which may contain other information and recommendations beyond what is mentioned above in the assessment and plan.  Meds ordered this encounter  Medications  . colchicine 0.6 MG tablet    Sig: Take two pills at onset of gout flare, followed by one more one hour later, max of three pills per flare. No atorvastatin x 3 days    Dispense:  9 tablet    Refill:  3    Orders Placed This Encounter  Procedures  . Flu vaccine HIGH DOSE PF (Fluzone High dose)  . COMPLETE METABOLIC PANEL WITH GFR  . Lipid panel  . TSH  . Uric acid

## 2018-06-13 NOTE — Assessment & Plan Note (Signed)
Chronic problem; controlled; continue meds

## 2018-06-13 NOTE — Patient Instructions (Signed)
Let's recheck your thyroid in 3 months, but call sooner if losing weight or feeling jittery Keep up the great job with healthy lifestyle habits

## 2018-06-13 NOTE — Assessment & Plan Note (Signed)
Continue statin; do NOT take statin for 3 days if taking colchicine; check lipid panel in 3 months

## 2018-06-13 NOTE — Assessment & Plan Note (Signed)
TSH just one hundredth of a point off, but patient feels fine; will just recheck in 3 months, but call sooner if jittery, weight loss, etc

## 2018-06-13 NOTE — Assessment & Plan Note (Signed)
We discussed dietary recommendations; I believe her uric acid is staying elevated b/c of her CKD; she may talk to her nephrologist for other ideas

## 2018-06-13 NOTE — Assessment & Plan Note (Signed)
Seeing nephrologist; reviewed GFR and Cr with her today; avoid NSAIDs; stay well-hydrated

## 2018-06-13 NOTE — Assessment & Plan Note (Signed)
Check liver and kidneys every 3-6 months

## 2018-06-18 DIAGNOSIS — R809 Proteinuria, unspecified: Secondary | ICD-10-CM | POA: Diagnosis not present

## 2018-06-18 DIAGNOSIS — N183 Chronic kidney disease, stage 3 (moderate): Secondary | ICD-10-CM | POA: Diagnosis not present

## 2018-06-18 DIAGNOSIS — E875 Hyperkalemia: Secondary | ICD-10-CM | POA: Diagnosis not present

## 2018-06-18 DIAGNOSIS — I129 Hypertensive chronic kidney disease with stage 1 through stage 4 chronic kidney disease, or unspecified chronic kidney disease: Secondary | ICD-10-CM | POA: Diagnosis not present

## 2018-06-18 DIAGNOSIS — N179 Acute kidney failure, unspecified: Secondary | ICD-10-CM | POA: Diagnosis not present

## 2018-06-18 DIAGNOSIS — M109 Gout, unspecified: Secondary | ICD-10-CM | POA: Diagnosis not present

## 2018-07-08 ENCOUNTER — Encounter: Payer: Self-pay | Admitting: Family Medicine

## 2018-07-11 ENCOUNTER — Encounter: Payer: Self-pay | Admitting: Family Medicine

## 2018-07-11 DIAGNOSIS — N2581 Secondary hyperparathyroidism of renal origin: Secondary | ICD-10-CM | POA: Insufficient documentation

## 2018-07-11 HISTORY — DX: Secondary hyperparathyroidism of renal origin: N25.81

## 2018-07-30 ENCOUNTER — Other Ambulatory Visit: Payer: Self-pay | Admitting: Family Medicine

## 2018-07-30 ENCOUNTER — Encounter: Payer: Self-pay | Admitting: Family Medicine

## 2018-07-31 MED ORDER — AMLODIPINE BESYLATE 2.5 MG PO TABS: 2.5000 mg | ORAL_TABLET | Freq: Every day | ORAL | 3 refills | Status: DC

## 2018-07-31 NOTE — Addendum Note (Signed)
Addended by: LADA, Satira Anis on: 07/31/2018 11:19 AM   Modules accepted: Orders

## 2018-09-04 ENCOUNTER — Encounter: Payer: Self-pay | Admitting: Family Medicine

## 2018-09-04 ENCOUNTER — Other Ambulatory Visit: Payer: Self-pay | Admitting: Nurse Practitioner

## 2018-09-04 DIAGNOSIS — K743 Primary biliary cirrhosis: Secondary | ICD-10-CM | POA: Diagnosis not present

## 2018-09-05 ENCOUNTER — Other Ambulatory Visit: Payer: Self-pay

## 2018-09-05 DIAGNOSIS — M1A0610 Idiopathic chronic gout, right knee, without tophus (tophi): Secondary | ICD-10-CM

## 2018-09-05 DIAGNOSIS — E782 Mixed hyperlipidemia: Secondary | ICD-10-CM

## 2018-09-05 DIAGNOSIS — Z5181 Encounter for therapeutic drug level monitoring: Secondary | ICD-10-CM

## 2018-09-05 DIAGNOSIS — E890 Postprocedural hypothyroidism: Secondary | ICD-10-CM

## 2018-09-07 ENCOUNTER — Ambulatory Visit
Admission: RE | Admit: 2018-09-07 | Discharge: 2018-09-07 | Disposition: A | Discharge status: Home / Self Care | Payer: PPO | Source: Ambulatory Visit | Attending: Nurse Practitioner | Admitting: Nurse Practitioner

## 2018-09-07 DIAGNOSIS — Z5181 Encounter for therapeutic drug level monitoring: Secondary | ICD-10-CM | POA: Diagnosis not present

## 2018-09-07 DIAGNOSIS — K7460 Unspecified cirrhosis of liver: Secondary | ICD-10-CM | POA: Diagnosis not present

## 2018-09-07 DIAGNOSIS — K743 Primary biliary cirrhosis: Secondary | ICD-10-CM | POA: Diagnosis not present

## 2018-09-07 DIAGNOSIS — M1A0610 Idiopathic chronic gout, right knee, without tophus (tophi): Secondary | ICD-10-CM | POA: Diagnosis not present

## 2018-09-07 DIAGNOSIS — E782 Mixed hyperlipidemia: Secondary | ICD-10-CM | POA: Diagnosis not present

## 2018-09-07 DIAGNOSIS — E890 Postprocedural hypothyroidism: Secondary | ICD-10-CM | POA: Diagnosis not present

## 2018-09-07 LAB — LIPID PANEL
CHOLESTEROL: 175 mg/dL (ref <200)
HDL: 68 mg/dL (ref >50)
LDL CHOLESTEROL (CALC): 88 mg/dL
Non-HDL Cholesterol (Calc): 107 mg/dL (calc) (ref <130)
TRIGLYCERIDES: 99 mg/dL (ref <150)
Total CHOL/HDL Ratio: 2.6 (calc) (ref <5.0)

## 2018-09-07 LAB — COMPLETE METABOLIC PANEL WITH GFR
AG Ratio: 1.4 (calc) (ref 1.0–2.5)
ALBUMIN MSPROF: 4.3 g/dL (ref 3.6–5.1)
ALT: 11 U/L (ref 6–29)
AST: 25 U/L (ref 10–35)
Alkaline phosphatase (APISO): 105 U/L (ref 33–130)
BILIRUBIN TOTAL: 0.5 mg/dL (ref 0.2–1.2)
BUN / CREAT RATIO: 21 (calc) (ref 6–22)
BUN: 24 mg/dL (ref 7–25)
CO2: 28 mmol/L (ref 20–32)
CREATININE: 1.16 mg/dL — AB (ref 0.60–0.88)
Calcium: 9.6 mg/dL (ref 8.6–10.4)
Chloride: 102 mmol/L (ref 98–110)
GFR, Est African American: 50 mL/min/{1.73_m2} — ABNORMAL LOW (ref >=60)
GFR, Est Non African American: 43 mL/min/{1.73_m2} — ABNORMAL LOW (ref >=60)
GLOBULIN: 3 g/dL (ref 1.9–3.7)
Glucose, Bld: 93 mg/dL (ref 65–99)
Potassium: 4.3 mmol/L (ref 3.5–5.3)
SODIUM: 139 mmol/L (ref 135–146)
Total Protein: 7.3 g/dL (ref 6.1–8.1)

## 2018-09-07 LAB — TSH: TSH: 2.36 m[IU]/L (ref 0.40–4.50)

## 2018-09-07 LAB — URIC ACID: Uric Acid, Serum: 8.5 mg/dL — ABNORMAL HIGH (ref 2.5–7.0)

## 2018-09-12 ENCOUNTER — Encounter: Payer: Self-pay | Admitting: Family Medicine

## 2018-09-12 ENCOUNTER — Ambulatory Visit (INDEPENDENT_AMBULATORY_CARE_PROVIDER_SITE_OTHER): Payer: PPO | Admitting: Family Medicine

## 2018-09-12 VITALS — BP 128/76 | HR 88 | Temp 97.5°F | Ht 64.0 in | Wt 161.3 lb

## 2018-09-12 DIAGNOSIS — D485 Neoplasm of uncertain behavior of skin: Secondary | ICD-10-CM | POA: Diagnosis not present

## 2018-09-12 DIAGNOSIS — K743 Primary biliary cirrhosis: Secondary | ICD-10-CM | POA: Diagnosis not present

## 2018-09-12 DIAGNOSIS — M1A0610 Idiopathic chronic gout, right knee, without tophus (tophi): Secondary | ICD-10-CM

## 2018-09-12 DIAGNOSIS — E890 Postprocedural hypothyroidism: Secondary | ICD-10-CM

## 2018-09-12 DIAGNOSIS — N183 Chronic kidney disease, stage 3 unspecified: Secondary | ICD-10-CM

## 2018-09-12 DIAGNOSIS — I10 Essential (primary) hypertension: Secondary | ICD-10-CM | POA: Diagnosis not present

## 2018-09-12 DIAGNOSIS — Z85828 Personal history of other malignant neoplasm of skin: Secondary | ICD-10-CM | POA: Diagnosis not present

## 2018-09-12 DIAGNOSIS — E782 Mixed hyperlipidemia: Secondary | ICD-10-CM | POA: Diagnosis not present

## 2018-09-12 DIAGNOSIS — I779 Disorder of arteries and arterioles, unspecified: Secondary | ICD-10-CM

## 2018-09-12 DIAGNOSIS — I739 Peripheral vascular disease, unspecified: Secondary | ICD-10-CM

## 2018-09-12 HISTORY — DX: Personal history of other malignant neoplasm of skin: Z85.828

## 2018-09-12 NOTE — Assessment & Plan Note (Signed)
Patient's uric acid remains high but she is asymptomatic; suggested she talk to kidney doctor about other medicine options; avoid liver, beef, etc.

## 2018-09-12 NOTE — Assessment & Plan Note (Signed)
Improved GFR; monitored by nephrologist; avoiding NSAIDs

## 2018-09-12 NOTE — Patient Instructions (Addendum)
Try to limit saturated fats in your diet (bologna, hot dogs, barbeque, cheeseburgers, hamburgers, steak, bacon, sausage, cheese, etc.) and get more fresh fruits, vegetables, and whole grains  We'll get a scan of the neck arteries in January

## 2018-09-12 NOTE — Assessment & Plan Note (Signed)
Monitored by GI 

## 2018-09-12 NOTE — Assessment & Plan Note (Signed)
Suggested carotid US in January; ordered

## 2018-09-12 NOTE — Assessment & Plan Note (Signed)
LFTs were normal; monitored by GI

## 2018-09-12 NOTE — Assessment & Plan Note (Signed)
Reviewed old records, found old Wilkinson removed, Mohs surgery done

## 2018-09-12 NOTE — Assessment & Plan Note (Signed)
Try to limit saturated fats; limit eggs and cheese

## 2018-09-12 NOTE — Progress Notes (Signed)
BP 128/76   Pulse 88   Temp (!) 97.5 F (36.4 C)   Ht 5\' 4"  (1.626 m)   Wt 161 lb 4.8 oz (73.2 kg)   SpO2 98%   BMI 27.69 kg/m    Subjective:    Patient ID: Caitlin Jenkins, female    DOB: 08/14/1932, 84 y.o.   MRN: [id number]  HPI: Caitlin Jenkins is a 84 y.o. female  Chief Complaint  Patient presents with  . Follow-up    HPI She is here for f/u  She is getting her strength back; feeling good overall; making progress every day; breathing is okay  High uric acid; I asked if she has talked with nephrologist about her uric acid; it just stays high; no recent gout flares; chicken livers last week; no gravy; no alcohol really; has been high for years; no known gout in the family  High cholesterol; last LDL 88, HDL 68; no problems with medicine; not many fried foods; just occasionally has something fried  Hypothyroidism; better energy; moving bowels regularly; no blood in the stool  HTN; excellent control  CKD stage 3; GFR climbed from 40 to 43; knows to avoid NSAIDs  DEXA last done in 2016; she is not interested in another  Seeing GI doctor for PBC Last US done last week: IMPRESSION: Increased echogenicity of hepatic parenchyma is noted suggesting fatty infiltration or other diffuse hepatocellular disease.  No other significant abnormality seen in the abdomen.   Electronically Signed   By: Marijo Conception, M.D.   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 Evorn Gong, dermatologist; using almond oil, frankincense   She is interested in getting a handicapped placard for her car; she has arthritis in her knees; she would only use when really needed  Carotid athero; no TIA or stroke sx  Depression screen [facility] 2/9 09/12/2018 06/13/2018 05/18/2018 05/11/2018 04/04/2018  Decreased Interest 0 0 0 0 0  Down, Depressed, Hopeless 0 0 0 0 0  PHQ - 2 Score 0 0 0 0 0  Altered sleeping 0 - -  - -  Tired, decreased energy 0 - - - -  Change in appetite 0 - - - -  Feeling bad or failure about yourself  0 - - - -  Trouble concentrating 0 - - - -  Moving slowly or fidgety/restless 0 - - - -  Suicidal thoughts 0 - - - -  PHQ-9 Score 0 - - - -  Difficult doing work/chores Not difficult at all - - - -   Fall Risk  09/12/2018 06/13/2018 05/18/2018 05/11/2018 04/04/2018  Falls in the past year? 0 No No No No  Risk for fall due to : - - - - -  Risk for fall due to: Comment - - - - -    Relevant past medical, surgical, family and social history reviewed Past Medical History:  Diagnosis Date  . Biliary cirrhosis ([place])   . Cancer of skin of leg   . CKD (chronic kidney disease) stage 3, GFR 30-59 ml/min (HCC)   . Gout   . Hiatal hernia Oct 2015   8.5cm  . History of basal cell carcinoma (BCC) 09/12/2018   Oct 2016; Moh's surgery; right antitragus  . History of diverticulitis 2000  . Hyperlipidemia   . Hyperparathyroidism, secondary renal ([place]) 07/11/2018   Managed by nephrologist  . Hypertension   .  Hypothyroidism   . Skin cancer of nose    Past Surgical History:  Procedure Laterality Date  . ABDOMINAL HYSTERECTOMY  1995  . CATARACT EXTRACTION    . FOOT SURGERY  2000  . HEMORRHOID SURGERY    . THYROID SURGERY  1999   para thyroid   Family History  Problem Relation Age of Onset  . Emphysema Father   . Asthma Father   . Hypertension Daughter   . Heart disease Daughter        3 stents  . Diabetes Daughter   . Hypertension Son   . Cancer Son        prostate  . Diabetes Son   . Heart disease Daughter        heart attack, 2 stents  . Multiple sclerosis Daughter   . Diabetes Daughter   . Fibromyalgia Daughter   . Diabetes Daughter    Social History   Tobacco Use  . Smoking status: Never Smoker  . Smokeless tobacco: Never Used  . Tobacco comment: smoking cessation materials not required  Substance Use Topics  . Alcohol use: No  . Drug use: No     Office Visit  from 09/12/2018 in [facility]  AUDIT-C Score  1      Interim medical history since last visit reviewed. Allergies and medications reviewed  Review of Systems Per HPI unless specifically indicated above     Objective:    BP 128/76   Pulse 88   Temp (!) 97.5 F (36.4 C)   Ht 5\' 4"  (1.626 m)   Wt 161 lb 4.8 oz (73.2 kg)   SpO2 98%   BMI 27.69 kg/m   Wt Readings from Last 3 Encounters:  09/12/18 161 lb 4.8 oz (73.2 kg)  06/13/18 158 lb 1.6 oz (71.7 kg)  05/18/18 159 lb 9.6 oz (72.4 kg)    Physical Exam Constitutional:      General: She is not in acute distress.    Appearance: She is well-developed. She is not diaphoretic.  HENT:     Head: Normocephalic and atraumatic.  Eyes:     General: No scleral icterus. Neck:     Thyroid: No thyromegaly.  Cardiovascular:     Rate and Rhythm: Normal rate and regular rhythm.     Heart sounds: Normal heart sounds. No murmur.  Pulmonary:     Effort: Pulmonary effort is normal. No respiratory distress.     Breath sounds: Normal breath sounds. No wheezing.  Abdominal:     General: Bowel sounds are normal. There is no distension.     Palpations: Abdomen is soft.  Skin:    General: Skin is warm and dry.     Coloration: Skin is not pale.     Comments: Erythema and scale right above the tragus on the RIGHT ear  Neurological:     Mental Status: She is alert.  Psychiatric:        Behavior: Behavior normal.        Thought Content: Thought content normal.        Judgment: Judgment normal.     Results for orders placed or performed in visit on 09/05/18  Uric acid  Result Value Ref Range   Uric Acid, Serum 8.5 (H) 2.5 - 7.0 mg/dL  TSH  Result Value Ref Range   TSH 2.36 0.40 - 4.50 mIU/L  Lipid panel  Result Value Ref Range   Cholesterol 175 <200 mg/dL   HDL 68 >50  mg/dL   Triglycerides 99 <150 mg/dL   LDL Cholesterol (Calc) 88 mg/dL (calc)   Total CHOL/HDL Ratio 2.6 <5.0 (calc)   Non-HDL Cholesterol  (Calc) 107 <130 mg/dL (calc)  COMPLETE METABOLIC PANEL WITH GFR  Result Value Ref Range   Glucose, Bld 93 65 - 99 mg/dL   BUN 24 7 - 25 mg/dL   Creat 1.16 (H) 0.60 - 0.88 mg/dL   GFR, Est Non African American 43 (L) > OR = 60 mL/min/1.51m2   GFR, Est African American 50 (L) > OR = 60 mL/min/1.12m2   BUN/Creatinine Ratio 21 6 - 22 (calc)   Sodium 139 135 - 146 mmol/L   Potassium 4.3 3.5 - 5.3 mmol/L   Chloride 102 98 - 110 mmol/L   CO2 28 20 - 32 mmol/L   Calcium 9.6 8.6 - 10.4 mg/dL   Total Protein 7.3 6.1 - 8.1 g/dL   Albumin 4.3 3.6 - 5.1 g/dL   Globulin 3.0 1.9 - 3.7 g/dL (calc)   AG Ratio 1.4 1.0 - 2.5 (calc)   Total Bilirubin 0.5 0.2 - 1.2 mg/dL   Alkaline phosphatase (APISO) 105 33 - 130 U/L   AST 25 10 - 35 U/L   ALT 11 6 - 29 U/L      Assessment & Plan:   Problem List Items Addressed This Visit      Cardiovascular and Mediastinum   Essential hypertension, benign (Chronic)    Excellent control; try to limit salt      Carotid artery disease (HCC)    Suggested carotid US in January; ordered      Relevant Orders   US Carotid Bilateral     Digestive   Primary biliary cholangitis (HCC) (Chronic)    Monitored by GI      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)    LFTs were normal; monitored by GI        Endocrine   Hypothyroidism (Chronic)    Controlled, normal TSH        Musculoskeletal and Integument   History of basal cell carcinoma (BCC)    Reviewed old records, found old BCC removed, Mohs surgery done        Genitourinary   CKD (chronic kidney disease) stage 3, GFR 30-59 ml/min (HCC) (Chronic)    Improved GFR; monitored by nephrologist; avoiding NSAIDs        Other   Hyperlipidemia (Chronic)    Try to limit saturated fats; limit eggs and cheese      Gout (Chronic)    Patient's uric acid remains high but she is asymptomatic; suggested she talk to kidney doctor about other medicine options; avoid liver, beef,  etc.       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 Evorn Gong, dermatologist; she will get this checked out, either AK or very very early skin cancer       Follow up plan: Return in about 3 months (around 12/12/2018) for follow-up visit with Dr. Sanda Klein.  An after-visit summary was printed and given to the patient at [place].  Please see the patient instructions which may contain other information and recommendations beyond what is mentioned above in the assessment and plan.  No orders of the defined types were placed in this encounter.   Orders Placed This Encounter  Procedures  . US Carotid Bilateral

## 2018-09-12 NOTE — Assessment & Plan Note (Signed)
Excellent control; try to limit salt

## 2018-09-12 NOTE — Assessment & Plan Note (Signed)
Controlled, normal TSH

## 2018-09-17 ENCOUNTER — Other Ambulatory Visit: Payer: Self-pay

## 2018-09-17 ENCOUNTER — Encounter: Payer: Self-pay | Admitting: Family Medicine

## 2018-09-17 MED ORDER — LEVOTHYROXINE SODIUM 100 MCG PO TABS: 100.0000 ug | ORAL_TABLET | Freq: Every day | ORAL | 3 refills | Status: DC

## 2018-09-17 NOTE — Telephone Encounter (Signed)
Lab Results  Component Value Date   TSH 2.36 09/07/2018

## 2018-09-18 ENCOUNTER — Telehealth: Payer: Self-pay | Admitting: Family Medicine

## 2018-09-18 NOTE — Telephone Encounter (Signed)
Copied from [place] [id number]. Topic: General - Other >> Sep 18, 2018  9:16 AM Adelene Idler wrote: [facility] called in and stated they need a verbal authorization to change levothyroxine (SYNTHROID, LEVOTHROID) 100 MCG tablet to the manufacturing stock. May leave detailed verbal message. Make sure to leave first and full last name  Cb# [phone]

## 2018-09-18 NOTE — Telephone Encounter (Signed)
That is fine BUT we'll need to have the patient get a TSH checked six weeks after she starts the new manufacturer; please ORDER and let patient know

## 2018-09-24 ENCOUNTER — Other Ambulatory Visit: Payer: Self-pay

## 2018-09-24 DIAGNOSIS — E890 Postprocedural hypothyroidism: Secondary | ICD-10-CM

## 2018-09-24 NOTE — Telephone Encounter (Signed)
Left detailed voicemail with pharmacy and patient notified to come in for labs 6 weeks after starting new medication.

## 2018-09-28 DIAGNOSIS — Z961 Presence of intraocular lens: Secondary | ICD-10-CM | POA: Diagnosis not present

## 2018-09-28 DIAGNOSIS — H35033 Hypertensive retinopathy, bilateral: Secondary | ICD-10-CM | POA: Diagnosis not present

## 2018-10-09 ENCOUNTER — Other Ambulatory Visit: Payer: Self-pay | Admitting: Family Medicine

## 2018-10-09 NOTE — Telephone Encounter (Signed)
Lab Results  Component Value Date   CREATININE 1.16 (H) 09/07/2018   Lab Results  Component Value Date   K 4.3 09/07/2018

## 2018-10-12 DIAGNOSIS — D2271 Melanocytic nevi of right lower limb, including hip: Secondary | ICD-10-CM | POA: Diagnosis not present

## 2018-10-12 DIAGNOSIS — L570 Actinic keratosis: Secondary | ICD-10-CM | POA: Diagnosis not present

## 2018-10-12 DIAGNOSIS — Z08 Encounter for follow-up examination after completed treatment for malignant neoplasm: Secondary | ICD-10-CM | POA: Diagnosis not present

## 2018-10-12 DIAGNOSIS — D2261 Melanocytic nevi of right upper limb, including shoulder: Secondary | ICD-10-CM | POA: Diagnosis not present

## 2018-10-12 DIAGNOSIS — Z85828 Personal history of other malignant neoplasm of skin: Secondary | ICD-10-CM | POA: Diagnosis not present

## 2018-10-12 DIAGNOSIS — X32XXXA Exposure to sunlight, initial encounter: Secondary | ICD-10-CM | POA: Diagnosis not present

## 2018-10-12 DIAGNOSIS — D2272 Melanocytic nevi of left lower limb, including hip: Secondary | ICD-10-CM | POA: Diagnosis not present

## 2018-10-12 DIAGNOSIS — D225 Melanocytic nevi of trunk: Secondary | ICD-10-CM | POA: Diagnosis not present

## 2018-10-12 DIAGNOSIS — D2262 Melanocytic nevi of left upper limb, including shoulder: Secondary | ICD-10-CM | POA: Diagnosis not present

## 2018-10-23 DIAGNOSIS — H903 Sensorineural hearing loss, bilateral: Secondary | ICD-10-CM | POA: Diagnosis not present

## 2018-11-06 DIAGNOSIS — H903 Sensorineural hearing loss, bilateral: Secondary | ICD-10-CM | POA: Diagnosis not present

## 2018-12-12 ENCOUNTER — Encounter: Payer: Self-pay | Admitting: Family Medicine

## 2018-12-13 MED ORDER — ATORVASTATIN CALCIUM 10 MG PO TABS: 10.0000 mg | ORAL_TABLET | Freq: Every day | ORAL | 1 refills | Status: DC

## 2018-12-20 DIAGNOSIS — E871 Hypo-osmolality and hyponatremia: Secondary | ICD-10-CM | POA: Diagnosis not present

## 2018-12-20 DIAGNOSIS — E785 Hyperlipidemia, unspecified: Secondary | ICD-10-CM | POA: Diagnosis not present

## 2018-12-20 DIAGNOSIS — N183 Chronic kidney disease, stage 3 (moderate): Secondary | ICD-10-CM | POA: Diagnosis not present

## 2018-12-20 DIAGNOSIS — I129 Hypertensive chronic kidney disease with stage 1 through stage 4 chronic kidney disease, or unspecified chronic kidney disease: Secondary | ICD-10-CM | POA: Diagnosis not present

## 2018-12-24 DIAGNOSIS — I129 Hypertensive chronic kidney disease with stage 1 through stage 4 chronic kidney disease, or unspecified chronic kidney disease: Secondary | ICD-10-CM | POA: Diagnosis not present

## 2018-12-24 DIAGNOSIS — N183 Chronic kidney disease, stage 3 (moderate): Secondary | ICD-10-CM | POA: Diagnosis not present

## 2018-12-24 DIAGNOSIS — E875 Hyperkalemia: Secondary | ICD-10-CM | POA: Diagnosis not present

## 2018-12-24 DIAGNOSIS — M109 Gout, unspecified: Secondary | ICD-10-CM | POA: Diagnosis not present

## 2018-12-24 DIAGNOSIS — R809 Proteinuria, unspecified: Secondary | ICD-10-CM | POA: Diagnosis not present

## 2019-01-09 IMAGING — CR DG CHEST 2V
1 series · 2 of 2 positions shown · non-contrast
Comparison: 05/13/2018

ADDENDUM:
The impression for this report is confusing. The second paragraph in
particular is unclear. This paragraph should state that there is no
pleural effusion or dense consolidative pneumonia. The patient does
have some trace patchy opacity at the lung bases on the frontal film
which may be atelectatic or related to a non consolidative process
such as atypical infection.
CLINICAL DATA: Wheezing

EXAM:
CHEST - 2 VIEW

[Series 1: dg chest 2 view · 0.14mm/px · 2 of 2 slices shown]
[im 1/2]
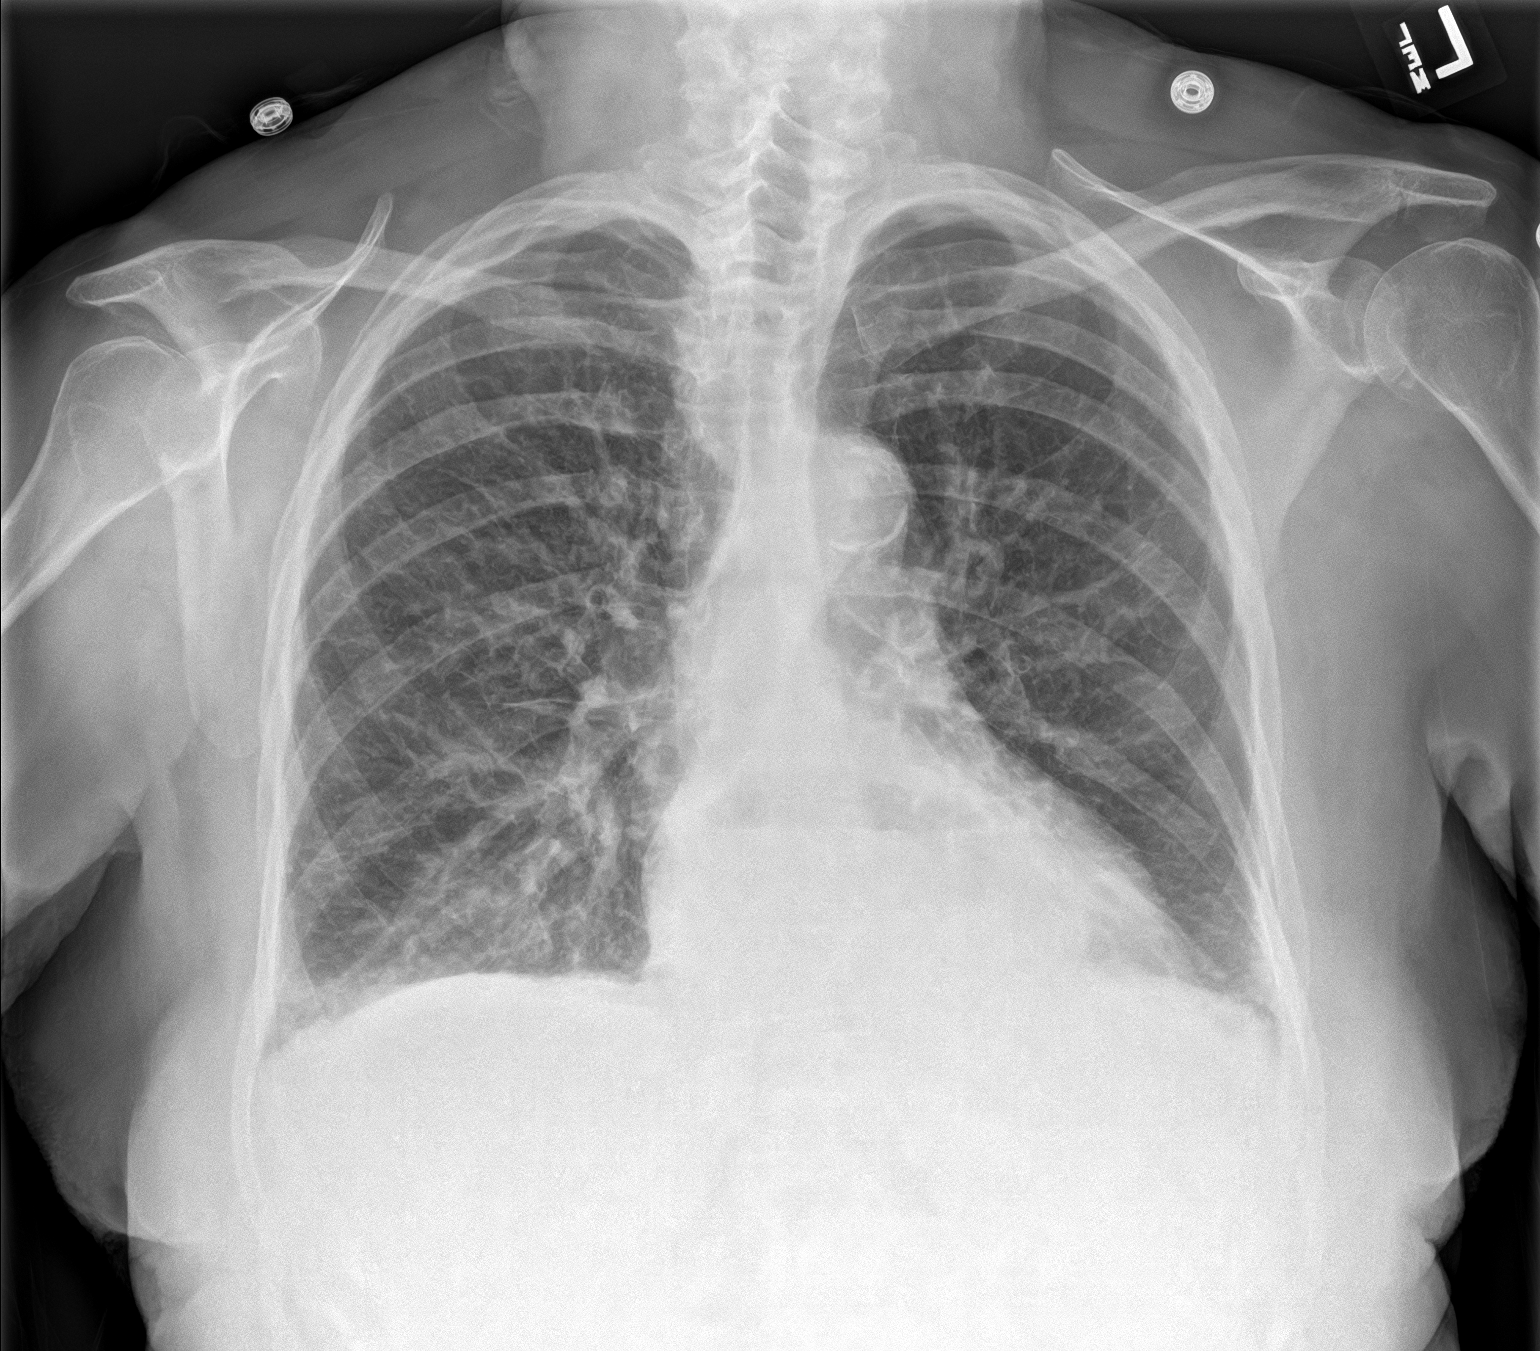
[im 2/2]
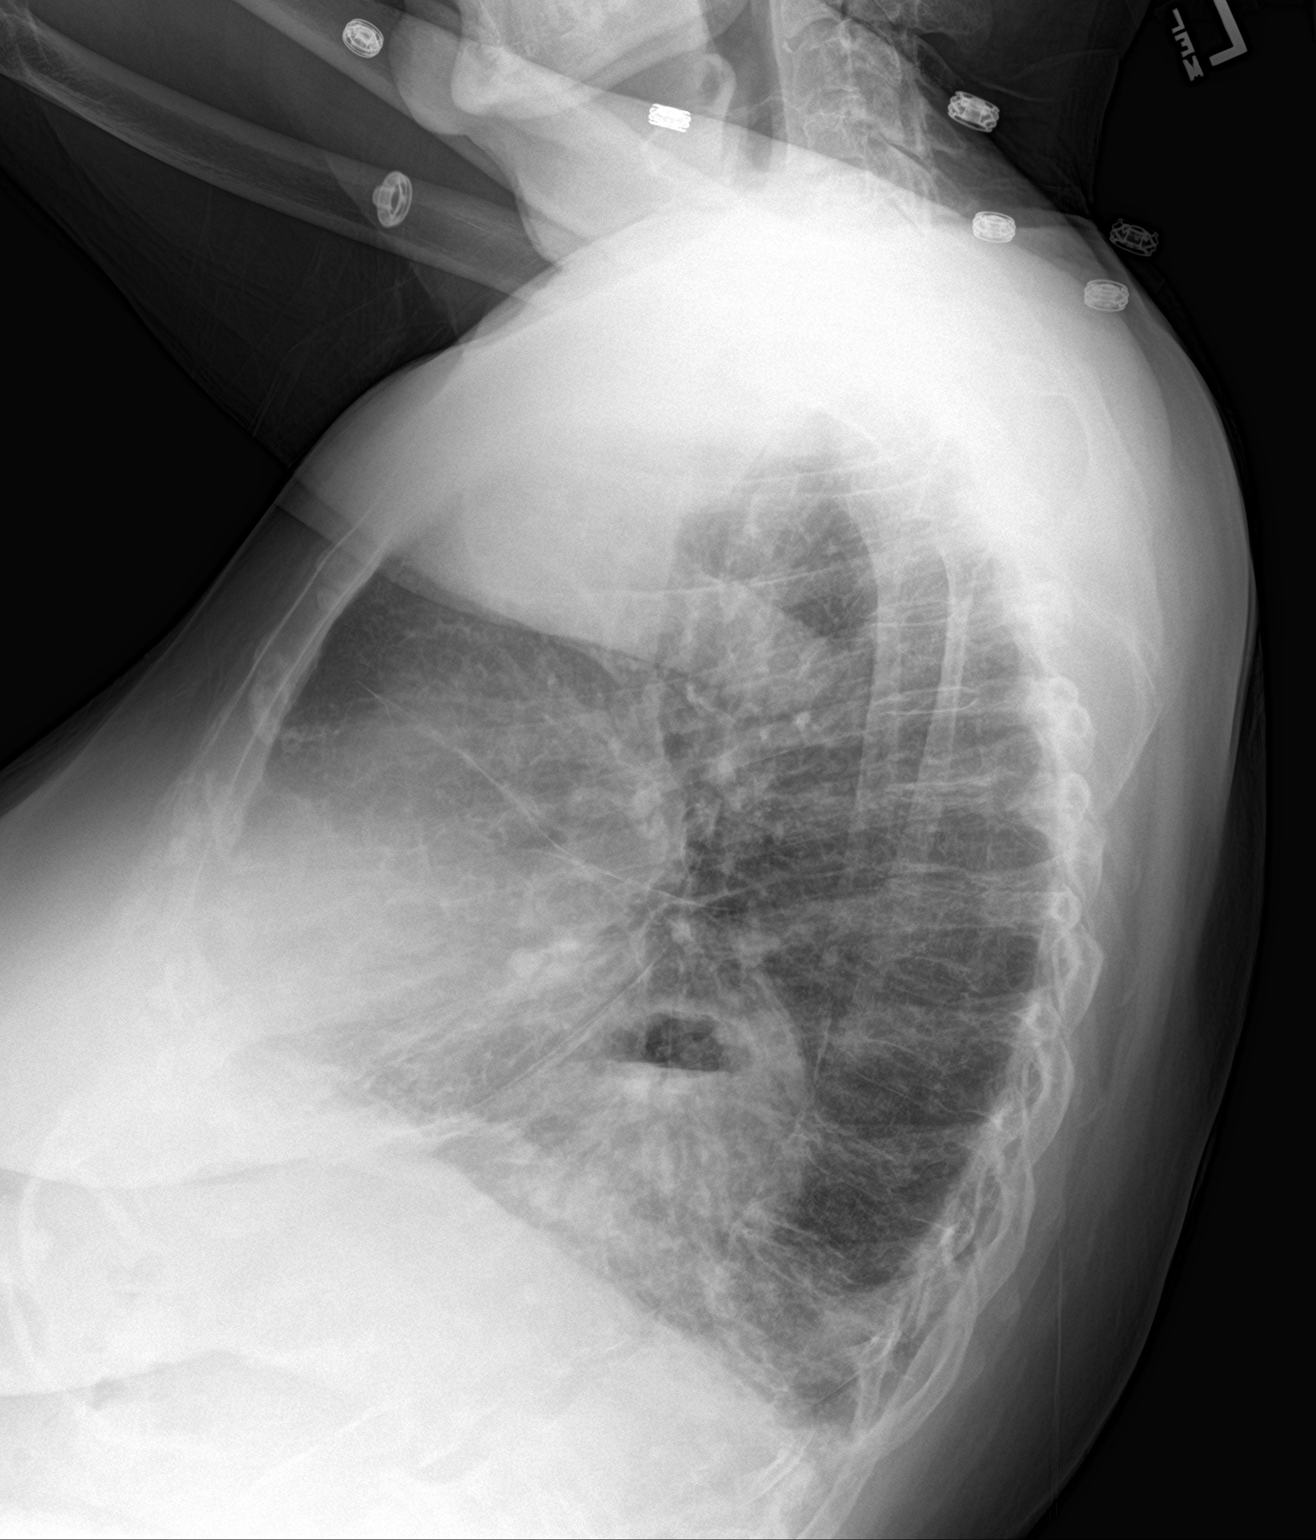

[2 of 2 positions shown; findings below may reference images not displayed]

FINDINGS: The lungs are clear without focal pneumonia, edema, pneumothorax or
pleural effusion. Interstitial markings are diffusely coarsened with
chronic features. Hyperexpansion suggests emphysema. The
cardiopericardial silhouette is within normal limits for size.
Moderate hiatal hernia, stable. The visualized bony structures of
the thorax are intact.
IMPRESSION: Chronic interstitial coarsening with hyperexpansion. Features
suggest emphysema.

No substantial pleural effusion or focal airspace consolidation.
There is some minimal basilar airspace opacity which may be related
to atelectasis or pneumonia.

Hiatal hernia.

## 2019-02-23 ENCOUNTER — Other Ambulatory Visit: Payer: Self-pay

## 2019-02-23 ENCOUNTER — Inpatient Hospital Stay
Admission: EM | Admit: 2019-02-23 | Discharge: 2019-02-28 | DRG: 815 | Disposition: A | Discharge status: Home Health | Payer: PPO | Attending: Internal Medicine | Admitting: Internal Medicine

## 2019-02-23 DIAGNOSIS — A419 Sepsis, unspecified organism: Secondary | ICD-10-CM

## 2019-02-23 DIAGNOSIS — M109 Gout, unspecified: Secondary | ICD-10-CM | POA: Diagnosis not present

## 2019-02-23 DIAGNOSIS — Z825 Family history of asthma and other chronic lower respiratory diseases: Secondary | ICD-10-CM | POA: Diagnosis not present

## 2019-02-23 DIAGNOSIS — I129 Hypertensive chronic kidney disease with stage 1 through stage 4 chronic kidney disease, or unspecified chronic kidney disease: Secondary | ICD-10-CM | POA: Diagnosis present

## 2019-02-23 DIAGNOSIS — Z79899 Other long term (current) drug therapy: Secondary | ICD-10-CM

## 2019-02-23 DIAGNOSIS — N2581 Secondary hyperparathyroidism of renal origin: Secondary | ICD-10-CM | POA: Diagnosis present

## 2019-02-23 DIAGNOSIS — Z90710 Acquired absence of both cervix and uterus: Secondary | ICD-10-CM

## 2019-02-23 DIAGNOSIS — M1711 Unilateral primary osteoarthritis, right knee: Secondary | ICD-10-CM | POA: Diagnosis not present

## 2019-02-23 DIAGNOSIS — Z8249 Family history of ischemic heart disease and other diseases of the circulatory system: Secondary | ICD-10-CM | POA: Diagnosis not present

## 2019-02-23 DIAGNOSIS — Z1159 Encounter for screening for other viral diseases: Secondary | ICD-10-CM

## 2019-02-23 DIAGNOSIS — L0390 Cellulitis, unspecified: Secondary | ICD-10-CM | POA: Diagnosis not present

## 2019-02-23 DIAGNOSIS — E039 Hypothyroidism, unspecified: Secondary | ICD-10-CM | POA: Diagnosis not present

## 2019-02-23 DIAGNOSIS — W5501XA Bitten by cat, initial encounter: Secondary | ICD-10-CM | POA: Diagnosis not present

## 2019-02-23 DIAGNOSIS — Z79890 Hormone replacement therapy (postmenopausal): Secondary | ICD-10-CM

## 2019-02-23 DIAGNOSIS — Z882 Allergy status to sulfonamides status: Secondary | ICD-10-CM

## 2019-02-23 DIAGNOSIS — N183 Chronic kidney disease, stage 3 (moderate): Secondary | ICD-10-CM | POA: Diagnosis not present

## 2019-02-23 DIAGNOSIS — Z85828 Personal history of other malignant neoplasm of skin: Secondary | ICD-10-CM

## 2019-02-23 DIAGNOSIS — M170 Bilateral primary osteoarthritis of knee: Secondary | ICD-10-CM | POA: Diagnosis present

## 2019-02-23 DIAGNOSIS — K745 Biliary cirrhosis, unspecified: Secondary | ICD-10-CM | POA: Diagnosis present

## 2019-02-23 DIAGNOSIS — Z833 Family history of diabetes mellitus: Secondary | ICD-10-CM

## 2019-02-23 DIAGNOSIS — T148XXA Other injury of unspecified body region, initial encounter: Secondary | ICD-10-CM

## 2019-02-23 DIAGNOSIS — E785 Hyperlipidemia, unspecified: Secondary | ICD-10-CM | POA: Diagnosis not present

## 2019-02-23 DIAGNOSIS — Z66 Do not resuscitate: Secondary | ICD-10-CM | POA: Diagnosis present

## 2019-02-23 DIAGNOSIS — Z20828 Contact with and (suspected) exposure to other viral communicable diseases: Secondary | ICD-10-CM | POA: Diagnosis not present

## 2019-02-23 DIAGNOSIS — R262 Difficulty in walking, not elsewhere classified: Secondary | ICD-10-CM | POA: Diagnosis not present

## 2019-02-23 DIAGNOSIS — Z880 Allergy status to penicillin: Secondary | ICD-10-CM | POA: Diagnosis not present

## 2019-02-23 DIAGNOSIS — R269 Unspecified abnormalities of gait and mobility: Secondary | ICD-10-CM | POA: Diagnosis present

## 2019-02-23 DIAGNOSIS — F0390 Unspecified dementia without behavioral disturbance: Secondary | ICD-10-CM | POA: Diagnosis present

## 2019-02-23 DIAGNOSIS — M25571 Pain in right ankle and joints of right foot: Secondary | ICD-10-CM | POA: Diagnosis present

## 2019-02-23 DIAGNOSIS — I10 Essential (primary) hypertension: Secondary | ICD-10-CM | POA: Diagnosis not present

## 2019-02-23 DIAGNOSIS — L03115 Cellulitis of right lower limb: Secondary | ICD-10-CM | POA: Diagnosis not present

## 2019-02-23 DIAGNOSIS — Z888 Allergy status to other drugs, medicaments and biological substances status: Secondary | ICD-10-CM | POA: Diagnosis not present

## 2019-02-23 DIAGNOSIS — M19071 Primary osteoarthritis, right ankle and foot: Secondary | ICD-10-CM | POA: Diagnosis not present

## 2019-02-23 DIAGNOSIS — M25462 Effusion, left knee: Secondary | ICD-10-CM | POA: Diagnosis not present

## 2019-02-23 DIAGNOSIS — A281 Cat-scratch disease: Secondary | ICD-10-CM | POA: Diagnosis not present

## 2019-02-23 DIAGNOSIS — M2540 Effusion, unspecified joint: Secondary | ICD-10-CM

## 2019-02-23 DIAGNOSIS — E871 Hypo-osmolality and hyponatremia: Secondary | ICD-10-CM | POA: Diagnosis present

## 2019-02-23 DIAGNOSIS — Z820 Family history of epilepsy and other diseases of the nervous system: Secondary | ICD-10-CM

## 2019-02-23 LAB — COMPREHENSIVE METABOLIC PANEL
ALT: 13 U/L (ref 0–44)
AST: 27 U/L (ref 15–41)
Albumin: 4.8 g/dL (ref 3.5–5.0)
Alkaline Phosphatase: 93 U/L (ref 38–126)
Anion gap: 11 (ref 5–15)
BUN: 25 mg/dL — ABNORMAL HIGH (ref 8–23)
CO2: 22 mmol/L (ref 22–32)
Calcium: 9.9 mg/dL (ref 8.9–10.3)
Chloride: 104 mmol/L (ref 98–111)
Creatinine, Ser: 1.04 mg/dL — ABNORMAL HIGH (ref 0.44–1.00)
GFR calc Af Amer: 57 mL/min — ABNORMAL LOW (ref >60)
GFR calc non Af Amer: 49 mL/min — ABNORMAL LOW (ref >60)
Glucose, Bld: 107 mg/dL — ABNORMAL HIGH (ref 70–99)
Potassium: 4.4 mmol/L (ref 3.5–5.1)
Sodium: 137 mmol/L (ref 135–145)
Total Bilirubin: 0.8 mg/dL (ref 0.3–1.2)
Total Protein: 8.4 g/dL — ABNORMAL HIGH (ref 6.5–8.1)

## 2019-02-23 LAB — CBC WITH DIFFERENTIAL/PLATELET
Abs Immature Granulocytes: 0.12 10*3/uL — ABNORMAL HIGH (ref 0.00–0.07)
Basophils Absolute: 0 10*3/uL (ref 0.0–0.1)
Basophils Relative: 0 %
Eosinophils Absolute: 0 10*3/uL (ref 0.0–0.5)
Eosinophils Relative: 0 %
HCT: 38.8 % (ref 36.0–46.0)
Hemoglobin: 13.1 g/dL (ref 12.0–15.0)
Immature Granulocytes: 1 %
Lymphocytes Relative: 7 %
Lymphs Abs: 1.4 10*3/uL (ref 0.7–4.0)
MCH: 29.7 pg (ref 26.0–34.0)
MCHC: 33.8 g/dL (ref 30.0–36.0)
MCV: 88 fL (ref 80.0–100.0)
Monocytes Absolute: 1.1 10*3/uL — ABNORMAL HIGH (ref 0.1–1.0)
Monocytes Relative: 6 %
Neutro Abs: 17 10*3/uL — ABNORMAL HIGH (ref 1.7–7.7)
Neutrophils Relative %: 86 %
Platelets: 244 10*3/uL (ref 150–400)
RBC: 4.41 MIL/uL (ref 3.87–5.11)
RDW: 13.5 % (ref 11.5–15.5)
WBC: 19.6 10*3/uL — ABNORMAL HIGH (ref 4.0–10.5)
nRBC: 0 % (ref 0.0–0.2)

## 2019-02-23 LAB — URINALYSIS, ROUTINE W REFLEX MICROSCOPIC
Bilirubin Urine: NEGATIVE
Glucose, UA: NEGATIVE mg/dL
Hgb urine dipstick: NEGATIVE
Ketones, ur: NEGATIVE mg/dL
Nitrite: NEGATIVE
Protein, ur: 100 mg/dL — AB
Specific Gravity, Urine: 1.01 (ref 1.005–1.030)
pH: 6 (ref 5.0–8.0)

## 2019-02-23 LAB — LACTIC ACID, PLASMA
Lactic Acid, Venous: 1.6 mmol/L (ref 0.5–1.9)
Lactic Acid, Venous: 1.3 mmol/L (ref 0.5–1.9)

## 2019-02-23 LAB — SARS CORONAVIRUS 2 BY RT PCR (HOSPITAL ORDER, PERFORMED IN ~~LOC~~ HOSPITAL LAB): SARS Coronavirus 2: NEGATIVE

## 2019-02-23 MED ORDER — VANCOMYCIN HCL IN DEXTROSE 750-5 MG/150ML-% IV SOLN
750.0000 mg | INTRAVENOUS | Status: DC
  Administered 2019-02-24: 750 mg via INTRAVENOUS
  Filled 2019-02-23 (×2): qty 150

## 2019-02-23 MED ORDER — SODIUM CHLORIDE 0.9 % IV SOLN
1.0000 g | INTRAVENOUS | Status: DC
  Administered 2019-02-24 – 2019-02-27 (×4): 1 g via INTRAVENOUS
  Filled 2019-02-23: qty 10
  Filled 2019-02-23 (×2): qty 1
  Filled 2019-02-23: qty 10
  Filled 2019-02-23: qty 1

## 2019-02-23 MED ORDER — ENOXAPARIN SODIUM 40 MG/0.4ML ~~LOC~~ SOLN
40.0000 mg | SUBCUTANEOUS | Status: DC
  Administered 2019-02-23 – 2019-02-27 (×5): 40 mg via SUBCUTANEOUS
  Filled 2019-02-23 (×5): qty 0.4

## 2019-02-23 MED ORDER — SODIUM CHLORIDE 0.9 % IV SOLN
INTRAVENOUS | Status: DC
  Administered 2019-02-23 – 2019-02-26 (×3): via INTRAVENOUS

## 2019-02-23 MED ORDER — LEVOFLOXACIN IN D5W 750 MG/150ML IV SOLN: 750.0000 mg | Freq: Once | INTRAVENOUS | Status: DC

## 2019-02-23 MED ORDER — AZITHROMYCIN 250 MG PO TABS
500.0000 mg | ORAL_TABLET | Freq: Every day | ORAL | Status: DC
  Administered 2019-02-23 – 2019-02-25 (×3): 500 mg via ORAL
  Filled 2019-02-23: qty 1
  Filled 2019-02-23 (×2): qty 2

## 2019-02-23 MED ORDER — SODIUM CHLORIDE 0.9 % IV SOLN
1.0000 g | INTRAVENOUS | Status: DC
  Administered 2019-02-23: 1 g via INTRAVENOUS
  Filled 2019-02-23: qty 10

## 2019-02-23 MED ORDER — VANCOMYCIN HCL IN DEXTROSE 1-5 GM/200ML-% IV SOLN
1000.0000 mg | Freq: Once | INTRAVENOUS | Status: AC
  Administered 2019-02-23: 1000 mg via INTRAVENOUS
  Filled 2019-02-23: qty 200

## 2019-02-23 NOTE — ED Notes (Signed)
ED TO INPATIENT HANDOFF REPORT  ED Nurse Name and Phone #: Pearce Littlefield   S Name/Age/Gender Caitlin Jenkins 85 y.o. female Room/Bed: ED05HA/ED05HA  Code Status   Code Status: DNR  Home/SNF/Other Home Patient oriented to: self, place, time and situation Is this baseline? Yes   Triage Complete: Triage complete  Chief Complaint rt leg pain  Triage Note Pt states her cat scratched her yesterday. Pt states right lower leg hurts and is hard to walk due to pain. Right ankle and calf swollen and warm to touch.   Allergies Allergies  Allergen Reactions  . Indomethacin Hives  . Ace Inhibitors Swelling    Other reaction(s): SWELLING Facial swelling. Facial swelling.  . Augmentin [Amoxicillin-Pot Clavulanate] Other (See Comments)    Other reaction(s): Unknown  . Penicillins Nausea And Vomiting    Has patient had a PCN reaction causing immediate rash, facial/tongue/throat swelling, SOB or lightheadedness with hypotension: Yes Has patient had a PCN reaction causing severe rash involving mucus membranes or skin necrosis: No Has patient had a PCN reaction that required hospitalization: No Has patient had a PCN reaction occurring within the last 10 years: No If all of the above answers are "NO", then may proceed with Cephalosporin use.  [name] Sulfur Other (See Comments)    Level of Care/Admitting Diagnosis ED Disposition    ED Disposition Condition [facility] Area: [place] [[id number]]  Level of Care: Med-Surg [16]  Covid Evaluation: Screening Protocol (No Symptoms)  Diagnosis: Cellulitis [[id number]]  Admitting Physician: Otila Back [place]  Attending Physician: Otila Back [[id number]]  Estimated length of stay: past midnight tomorrow  Certification:: I certify this patient will need inpatient services for at least 2 midnights  PT Class (Do Not Modify): Inpatient [101]  PT Acc Code (Do Not Modify): Private [1]       B Medical/Surgery History Past  Medical History:  Diagnosis Date  . Biliary cirrhosis ([place])   . Cancer of skin of leg   . CKD (chronic kidney disease) stage 3, GFR 30-59 ml/min (HCC)   . Gout   . Hiatal hernia Oct 2015   8.5cm  . History of basal cell carcinoma (BCC) 09/12/2018   Oct 2016; Moh's surgery; right antitragus  . History of diverticulitis 2000  . Hyperlipidemia   . Hyperparathyroidism, secondary renal ([place]) 07/11/2018   Managed by nephrologist  . Hypertension   . Hypothyroidism   . Skin cancer of nose    Past Surgical History:  Procedure Laterality Date  . ABDOMINAL HYSTERECTOMY  1995  . CATARACT EXTRACTION    . FOOT SURGERY  2000  . HEMORRHOID SURGERY    . THYROID SURGERY  1999   para thyroid     A IV Location/Drains/Wounds Patient Lines/Drains/Airways Status   Active Line/Drains/Airways    Name:   Placement date:   Placement time:   Site:   Days:   Peripheral IV 02/23/19 Right Wrist   02/23/19    1330    Wrist   less than 1   Peripheral IV 02/23/19 Right Antecubital   02/23/19    1727    Antecubital   less than 1          Intake/Output Last 24 hours  Intake/Output Summary (Last 24 hours) at 02/23/2019 2222 Last data filed at 02/23/2019 1901 Gross per 24 hour  Intake 300 ml  Output -  Net 300 ml    Labs/Imaging Results for orders placed or performed during the  hospital encounter of 02/23/19 (from the past 48 hour(s))  CBC with Differential     Status: Abnormal   Collection Time: 02/23/19  1:31 PM  Result Value Ref Range   WBC 19.6 (H) 4.0 - 10.5 K/uL   RBC 4.41 3.87 - 5.11 MIL/uL   Hemoglobin 13.1 12.0 - 15.0 g/dL   HCT 38.8 36.0 - 46.0 %   MCV 88.0 80.0 - 100.0 fL   MCH 29.7 26.0 - 34.0 pg   MCHC 33.8 30.0 - 36.0 g/dL   RDW 13.5 11.5 - 15.5 %   Platelets 244 150 - 400 K/uL   nRBC 0.0 0.0 - 0.2 %   Neutrophils Relative % 86 %   Neutro Abs 17.0 (H) 1.7 - 7.7 K/uL   Lymphocytes Relative 7 %   Lymphs Abs 1.4 0.7 - 4.0 K/uL   Monocytes Relative 6 %   Monocytes Absolute 1.1  (H) 0.1 - 1.0 K/uL   Eosinophils Relative 0 %   Eosinophils Absolute 0.0 0.0 - 0.5 K/uL   Basophils Relative 0 %   Basophils Absolute 0.0 0.0 - 0.1 K/uL   Immature Granulocytes 1 %   Abs Immature Granulocytes 0.12 (H) 0.00 - 0.07 K/uL    Comment: Performed at [facility], [address] [place], [place] [address]  Comprehensive metabolic panel     Status: Abnormal   Collection Time: 02/23/19  1:31 PM  Result Value Ref Range   Sodium 137 135 - 145 mmol/L   Potassium 4.4 3.5 - 5.1 mmol/L   Chloride 104 98 - 111 mmol/L   CO2 22 22 - 32 mmol/L   Glucose, Bld 107 (H) 70 - 99 mg/dL   BUN 25 (H) 8 - 23 mg/dL   Creatinine, Ser 1.04 (H) 0.44 - 1.00 mg/dL   Calcium 9.9 8.9 - 10.3 mg/dL   Total Protein 8.4 (H) 6.5 - 8.1 g/dL   Albumin 4.8 3.5 - 5.0 g/dL   AST 27 15 - 41 U/L   ALT 13 0 - 44 U/L   Alkaline Phosphatase 93 38 - 126 U/L   Total Bilirubin 0.8 0.3 - 1.2 mg/dL   GFR calc non Af Amer 49 (L) >60 mL/min   GFR calc Af Amer 57 (L) >60 mL/min   Anion gap 11 5 - 15    Comment: Performed at [facility], [address] [place], [place] [address]  Lactic acid, plasma     Status: None   Collection Time: 02/23/19  5:07 PM  Result Value Ref Range   Lactic Acid, Venous 1.6 0.5 - 1.9 mmol/L    Comment: Performed at [facility], [address] [place], [place] [address]  Urinalysis, Routine w reflex microscopic     Status: Abnormal   Collection Time: 02/23/19  5:09 PM  Result Value Ref Range   Color, Urine STRAW (A) YELLOW   APPearance CLEAR (A) CLEAR   Specific Gravity, Urine 1.010 1.005 - 1.030   pH 6.0 5.0 - 8.0   Glucose, UA NEGATIVE NEGATIVE mg/dL   Hgb urine dipstick NEGATIVE NEGATIVE   Bilirubin Urine NEGATIVE NEGATIVE   Ketones, ur NEGATIVE NEGATIVE mg/dL   Protein, ur 100 (A) NEGATIVE mg/dL   Nitrite NEGATIVE NEGATIVE   Leukocytes,Ua SMALL (A) NEGATIVE   RBC / HPF 0-5 0 - 5 RBC/hpf   WBC, UA 0-5 0 - 5 WBC/hpf   Bacteria, UA RARE (A) NONE  SEEN   Squamous Epithelial / LPF 0-5 0 -  5   Mucus PRESENT     Comment: Performed at [facility], [address] [place], [place] [address]  SARS Coronavirus 2 (CEPHEID - Performed in [facility] hospital lab), Hosp Order     Status: None   Collection Time: 02/23/19  5:29 PM  Result Value Ref Range   SARS Coronavirus 2 NEGATIVE NEGATIVE    Comment: (NOTE) If result is NEGATIVE SARS-CoV-2 target nucleic acids are NOT DETECTED. The SARS-CoV-2 RNA is generally detectable in upper and lower  respiratory specimens during the acute phase of infection. The lowest  concentration of SARS-CoV-2 viral copies this assay can detect is 250  copies / mL. A negative result does not preclude SARS-CoV-2 infection  and should not be used as the sole basis for treatment or other  patient management decisions.  A negative result may occur with  improper specimen collection / handling, submission of specimen other  than nasopharyngeal swab, presence of viral mutation(s) within the  areas targeted by this assay, and inadequate number of viral copies  (<250 copies / mL). A negative result must be combined with clinical  observations, patient history, and epidemiological information. If result is POSITIVE SARS-CoV-2 target nucleic acids are DETECTED. The SARS-CoV-2 RNA is generally detectable in upper and lower  respiratory specimens dur ing the acute phase of infection.  Positive  results are indicative of active infection with SARS-CoV-2.  Clinical  correlation with patient history and other diagnostic information is  necessary to determine patient infection status.  Positive results do  not rule out bacterial infection or co-infection with other viruses. If result is PRESUMPTIVE POSTIVE SARS-CoV-2 nucleic acids MAY BE PRESENT.   A presumptive positive result was obtained on the submitted specimen  and confirmed on repeat testing.  While 2019 novel coronavirus  (SARS-CoV-2) nucleic acids may  be present in the submitted sample  additional confirmatory testing may be necessary for epidemiological  and / or clinical management purposes  to differentiate between  SARS-CoV-2 and other Sarbecovirus currently known to infect humans.  If clinically indicated additional testing with an alternate test  methodology [phone]) is advised. The SARS-CoV-2 RNA is generally  detectable in upper and lower respiratory sp ecimens during the acute  phase of infection. The expected result is Negative. Fact Sheet for Patients:  StrictlyIdeas.no Fact Sheet for Healthcare Providers: BankingDealers.co.za This test is not yet approved or cleared by the [place] FDA and has been authorized for detection and/or diagnosis of SARS-CoV-2 by FDA under an Emergency Use Authorization (EUA).  This EUA will remain in effect (meaning this test can be used) for the duration of the COVID-19 declaration under Section 564(b)(1) of the Act, 21 U.S.C. section 360bbb-3(b)(1), unless the authorization is terminated or revoked sooner. Performed at [facility], [address] [place], [place] [address]    No results found.  Pending Labs Unresulted Labs (From admission, onward)    Start     Ordered   02/24/19 3500  Basic metabolic panel  Tomorrow morning,   STAT     02/23/19 1859   02/24/19 0500  CBC  Tomorrow morning,   STAT     02/23/19 1859   02/24/19 0500  Magnesium  Tomorrow morning,   STAT     02/23/19 1859   02/23/19 2109  Lactic acid, plasma  Now then every 2 hours,   STAT     02/23/19 2220   02/23/19 1710  Urine culture  ONCE - STAT,  STAT     02/23/19 1709   02/23/19 1709  Blood Culture (routine x 2)  BLOOD CULTURE X 2,   STAT     02/23/19 1709          Vitals/Pain Today's Vitals   02/23/19 1933 02/23/19 2017 02/23/19 2051 02/23/19 2155  BP:  (!) 169/85 (!) 176/86 (!) 153/98  Pulse: (!) 110  (!) 101 (!) 112  Resp: 20  20 20    Temp: 98.3 F (36.8 C)     TempSrc: Oral     SpO2: 94%  99% 99%  Weight:      Height:      PainSc:        Isolation Precautions No active isolations  Medications Medications  vancomycin (VANCOCIN) IVPB 750 mg/150 ml premix (has no administration in time range)  cefTRIAXone (ROCEPHIN) 1 g in sodium chloride 0.9 % 100 mL IVPB (has no administration in time range)  enoxaparin (LOVENOX) injection 40 mg (has no administration in time range)  azithromycin (ZITHROMAX) tablet 500 mg (500 mg Oral Given 02/23/19 1933)  0.9 %  sodium chloride infusion (has no administration in time range)  vancomycin (VANCOCIN) IVPB 1000 mg/200 mL premix (0 mg Intravenous Stopped 02/23/19 1901)    Mobility walks Low fall risk   Focused Assessments Cardiac Assessment Handoff:  Cardiac Rhythm: Sinus tachycardia No results found for: CKTOTAL, CKMB, CKMBINDEX, TROPONINI No results found for: DDIMER Does the Patient currently have chest pain? No      R Recommendations: See Admitting Provider Note  Report given to:   Additional Notes:

## 2019-02-23 NOTE — Progress Notes (Signed)
CODE SEPSIS - PHARMACY COMMUNICATION  **Broad Spectrum Antibiotics should be administered within 1 hour of Sepsis diagnosis**  Time Code Sepsis Called/Page Received: 1709  Antibiotics Ordered: vanc/levofloxacin (RPh to change Levaquin to Rocephin if pt has tolerated cephalosporins)  Time of 1st antibiotic administration: 1735  Additional action taken by pharmacy: none  If necessary, Name of Provider/Nurse Contacted: NA   Tawnya Crook, PharmD Pharmacy Resident  02/23/2019 6:24 PM

## 2019-02-23 NOTE — ED Triage Notes (Signed)
Pt states her cat scratched her yesterday. Pt states right lower leg hurts and is hard to walk due to pain. Right ankle and calf swollen and warm to touch.

## 2019-02-23 NOTE — H&P (Signed)
[facility] NAME: Caitlin Jenkins    MR#:  [id number]  DATE OF BIRTH:  June 16, 1932  DATE OF ADMISSION:  02/23/2019  PRIMARY CARE PHYSICIAN: Arnetha Courser, MD   REQUESTING/REFERRING PHYSICIAN: Harvest Dark  CHIEF COMPLAINT:   Chief Complaint  Patient presents with  . Leg Pain    HISTORY OF PRESENT ILLNESS:  Caitlin Jenkins  is a 85 y.o. female with a known history of chronic kidney disease stage III, hyperlipidemia, hypertension and hypothyroidism who had a cat scratch/bite by her cat at home yesterday evening on her right lower extremity..  Presented to the emergency room with worsening redness and swelling involving the right lower extremity.  Thanks noted to have fevers with temperature of 100.4 in the emergency room.  Patient was diagnosed with right lower extremity cellulitis with cat scratch fevers.  Started empirically on IV antibiotics in the emergency room.  Medical service called to admit patient for further evaluation and management.  Patient denies any chest pain.  No shortness of breath.  No nausea or vomiting.  Could be test negative.  PAST MEDICAL HISTORY:   Past Medical History:  Diagnosis Date  . Biliary cirrhosis ([place])   . Cancer of skin of leg   . CKD (chronic kidney disease) stage 3, GFR 30-59 ml/min (HCC)   . Gout   . Hiatal hernia Oct 2015   8.5cm  . History of basal cell carcinoma (BCC) 09/12/2018   Oct 2016; Moh's surgery; right antitragus  . History of diverticulitis 2000  . Hyperlipidemia   . Hyperparathyroidism, secondary renal ([place]) 07/11/2018   Managed by nephrologist  . Hypertension   . Hypothyroidism   . Skin cancer of nose     PAST SURGICAL HISTORY:   Past Surgical History:  Procedure Laterality Date  . ABDOMINAL HYSTERECTOMY  1995  . CATARACT EXTRACTION    . FOOT SURGERY  2000  . HEMORRHOID SURGERY    . THYROID SURGERY  1999   para thyroid    SOCIAL HISTORY:   Social  History   Tobacco Use  . Smoking status: Never Smoker  . Smokeless tobacco: Never Used  . Tobacco comment: smoking cessation materials not required  Substance Use Topics  . Alcohol use: No    FAMILY HISTORY:   Family History  Problem Relation Age of Onset  . Emphysema Father   . Asthma Father   . Hypertension Daughter   . Heart disease Daughter        3 stents  . Diabetes Daughter   . Hypertension Son   . Cancer Son        prostate  . Diabetes Son   . Heart disease Daughter        heart attack, 2 stents  . Multiple sclerosis Daughter   . Diabetes Daughter   . Fibromyalgia Daughter   . Diabetes Daughter     DRUG ALLERGIES:   Allergies  Allergen Reactions  . Indomethacin Hives  . Ace Inhibitors Swelling    Other reaction(s): SWELLING Facial swelling. Facial swelling.  . Augmentin [Amoxicillin-Pot Clavulanate] Other (See Comments)    Other reaction(s): Unknown  . Penicillins Nausea And Vomiting    Has patient had a PCN reaction causing immediate rash, facial/tongue/throat swelling, SOB or lightheadedness with hypotension: Yes Has patient had a PCN reaction causing severe rash involving mucus membranes or skin necrosis: No Has patient had a PCN reaction that required hospitalization: No Has patient  had a PCN reaction occurring within the last 10 years: No If all of the above answers are "NO", then may proceed with Cephalosporin use.  [name] Sulfur Other (See Comments)    REVIEW OF SYSTEMS:   Review of Systems  Constitutional: Negative for chills.       Subjective fevers at home  HENT: Negative for hearing loss and tinnitus.   Eyes: Negative for blurred vision and double vision.  Respiratory: Negative for cough and hemoptysis.   Cardiovascular: Negative for chest pain and palpitations.  Gastrointestinal: Negative for abdominal pain, heartburn, nausea and vomiting.  Genitourinary: Negative for dysuria and urgency.  Musculoskeletal: Negative for myalgias and neck  pain.  Skin:       Redness and swelling on the right lower extremity.  Neurological: Negative for dizziness and headaches.  Psychiatric/Behavioral: Negative for depression and hallucinations.    MEDICATIONS AT HOME:   Prior to Admission medications   Medication Sig Start Date End Date Taking? Authorizing Provider  amLODipine (NORVASC) 2.5 MG tablet Take 1 tablet (2.5 mg total) by mouth daily. 07/31/18  Yes Lada, Satira Anis, MD  atorvastatin (LIPITOR) 10 MG tablet Take 1 tablet (10 mg total) by mouth at bedtime. (do not take for 3 days after taking colchicine) 12/13/18  Yes Lada, Satira Anis, MD  BIOTIN PO Take 250 mg by mouth 2 (two) times daily.    Yes [provider]  Calcium-Magnesium-Vitamin D (CALCIUM 1200+D3 PO) Take 1 capsule by mouth daily.   Yes [provider]  COLACE 100 MG capsule Take 100 mg by mouth daily.  01/26/15  Yes [provider]  levothyroxine (SYNTHROID, LEVOTHROID) 100 MCG tablet Take 1 tablet (100 mcg total) by mouth daily. 09/17/18  Yes Lada, Satira Anis, MD  losartan (COZAAR) 100 MG tablet Take 1 tablet by mouth daily (Avoid salt substitutes, no fruit smoothies.) 10/09/18  Yes Lada, Satira Anis, MD  niacinamide 500 MG tablet Take 1 tablet (500 mg total) by mouth 2 (two) times daily with a meal. 11/13/17  Yes Lada, Satira Anis, MD  Turmeric 450 MG CAPS Take 450 mg by mouth 2 (two) times daily.   Yes [provider]  ursodiol (ACTIGALL) 300 MG capsule Take 300 mg by mouth daily.  12/16/14  Yes [provider]  vitamin B-12 (CYANOCOBALAMIN) 250 MCG tablet Take 250 mcg by mouth daily.   Yes [provider]  colchicine 0.6 MG tablet Take two pills at onset of gout flare, followed by one more one hour later, max of three pills per flare. No atorvastatin x 3 days Patient not taking: Reported on 09/12/2018 06/13/18   Arnetha Courser, MD      VITAL SIGNS:  Blood pressure (!) 176/86, pulse (!) 101, temperature 98.3 F (36.8 C),  temperature source Oral, resp. rate 20, height 5\' 4"  (1.626 m), weight 69.4 kg, SpO2 99 %.  PHYSICAL EXAMINATION:  Physical Exam  GENERAL:  85 y.o.-year-old patient lying in the bed with no acute distress.  EYES: Pupils equal, round, reactive to light and accommodation. No scleral icterus. Extraocular muscles intact.  HEENT: Head atraumatic, normocephalic. Oropharynx and nasopharynx clear.  NECK:  Supple, no jugular venous distention. No thyroid enlargement, no tenderness.  LUNGS: Normal breath sounds bilaterally, no wheezing, rales,rhonchi or crepitation. No use of accessory muscles of respiration.  CARDIOVASCULAR: S1, S2 normal. No murmurs, rubs, or gallops.  ABDOMEN: Soft, nontender, nondistended. Bowel sounds present. No organomegaly or mass.  EXTREMITIES: Redness and swelling around the site  of cat scratch/bite under right lower extremity years wrist.  NEUROLOGIC: Cranial nerves II through XII are intact. Muscle strength 5/5 in all extremities. Sensation intact. Gait not checked.  PSYCHIATRIC: The patient is alert and oriented x 3.  SKIN: No obvious rash, lesion, or ulcer.   LABORATORY PANEL:   CBC Recent Labs  Lab 02/23/19 1331  WBC 19.6*  HGB 13.1  HCT 38.8  PLT 244   ------------------------------------------------------------------------------------------------------------------  Chemistries  Recent Labs  Lab 02/23/19 1331  NA 137  K 4.4  CL 104  CO2 22  GLUCOSE 107*  BUN 25*  CREATININE 1.04*  CALCIUM 9.9  AST 27  ALT 13  ALKPHOS 93  BILITOT 0.8   ------------------------------------------------------------------------------------------------------------------  Cardiac Enzymes No results for input(s): TROPONINI in the last 168 hours. ------------------------------------------------------------------------------------------------------------------  RADIOLOGY:  No results found.    IMPRESSION AND PLAN:  Patient is an 85 year old female with history  of chronic kidney disease stage III, hyperlipidemia, hypertension and hypothyroidism who had a cat scratch/bite by her cat at home yesterday evening on her right lower extremity.. Admitted with right lower extremity cellulitis secondary to cat scratch/bite  1.  Right lower extremity cellulitis with cat scratch fever Patient started on broad-spectrum IV antibiotics with IV vancomycin and Levaquin for cellulitis.  Added azithromycin for cat scratch fever. Monitor clinically and de-escalate antibiotics soon.  IV fluid hydration  2.  Chronic kidney disease stage III Stable.  3.  Hypertension Resume home blood pressure medications once medication reconciliation is done.  Monitor.  DVT prophylaxis; Lovenox  All the records are reviewed and case discussed with ED provider. Management plans discussed with the patient, family and they are in agreement.  CODE STATUS: DNR Patient awake and alert and oriented unable to make her medical decisions.  Clearly wishes to be DNR  TOTAL TIME TAKING CARE OF THIS PATIENT: 62 minutes.    Zenaida Tesar M.D on 02/23/2019 at 9:08 PM  Between 7am to 6pm - Pager - [phone]  After 6pm go to [email or web address] - Proofreader  [facility]  Office  [phone]  CC: Primary care physician; Arnetha Courser, MD   Note: This dictation was prepared with Dragon dictation along with smaller phrase technology. Any transcriptional errors that result from this process are unintentional.

## 2019-02-23 NOTE — Progress Notes (Signed)
Care Alignment Note  Advanced Directives Documents (Living Will, Power of Attorney) currently in the EHR no advanced directives documents available .  Has the patient discussed their wishes with their family/healthcare power of attorney no.  What does the patient/decision maker understand about their medical condition and the natural course of their disease.  Right lower extremity cellulitis.  Cat scratch fever.  Hypertension.  Chronic kidney disease stage III.  What is the patient/decision maker's biggest fear or concern for the future pain and suffering   What is the most important goal for this patient should their health condition worsen maintenance of function.  Current   Code Status: DNR  Current code status has been reviewed/updated.  Time spent:20 minutes

## 2019-02-23 NOTE — ED Notes (Signed)
Swelling, redness and pain to right ankle. Bitten/scratched by cat yesterday.

## 2019-02-23 NOTE — ED Notes (Signed)
Patient resting quietly with eyes closed in no acute distress.  

## 2019-02-23 NOTE — ED Provider Notes (Signed)
[facility] Emergency Department Provider Note  Time seen: 5:11 PM  I have reviewed the triage vital signs and the nursing notes.   HISTORY  Chief Complaint Leg Pain   HPI Caitlin Jenkins is a 85 y.o. female with a past medical history of CKD, hyperlipidemia, hypertension, presents to the emergency department for right leg pain.  According to the patient yesterday she was scratched or bitten by her pet cat.  States she woke this morning to significant swelling and discomfort to this area.  Patient was noted to be febrile upon arrival to the emergency department.  Patient denies any shortness of breath over baseline, cough or congestion.  Largely negative review of systems otherwise.  Describes moderate dull pain in the right lower extremity.   Past Medical History:  Diagnosis Date  . Biliary cirrhosis ([place])   . Cancer of skin of leg   . CKD (chronic kidney disease) stage 3, GFR 30-59 ml/min (HCC)   . Gout   . Hiatal hernia Oct 2015   8.5cm  . History of basal cell carcinoma (BCC) 09/12/2018   Oct 2016; Moh's surgery; right antitragus  . History of diverticulitis 2000  . Hyperlipidemia   . Hyperparathyroidism, secondary renal ([place]) 07/11/2018   Managed by nephrologist  . Hypertension   . Hypothyroidism   . Skin cancer of nose     Patient Active Problem List   Diagnosis Date Noted  . History of basal cell carcinoma (BCC) 09/12/2018  . Hyperparathyroidism, secondary renal ([place]) 07/11/2018  . Primary biliary cholangitis ([place]) 03/20/2017  . Overweight (BMI 25.0-29.9) 11/29/2015  . Carotid artery disease ([place]) 10/09/2015  . Medication monitoring encounter 10/09/2015  . Nail abnormalities 10/09/2015  . Arthralgia of multiple joints 10/09/2015  . Essential hypertension, benign 06/19/2015  . 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 Hyperlipidemia   . Hypothyroidism   . Gout   . CKD (chronic kidney disease) stage 3, GFR  30-59 ml/min (HCC)   . Hiatal hernia 06/26/2014    Past Surgical History:  Procedure Laterality Date  . ABDOMINAL HYSTERECTOMY  1995  . CATARACT EXTRACTION    . FOOT SURGERY  2000  . HEMORRHOID SURGERY    . THYROID SURGERY  1999   para thyroid    Prior to Admission medications   Medication Sig Start Date End Date Taking? Authorizing Provider  amLODipine (NORVASC) 2.5 MG tablet Take 1 tablet (2.5 mg total) by mouth daily. 07/31/18   Arnetha Courser, MD  atorvastatin (LIPITOR) 10 MG tablet Take 1 tablet (10 mg total) by mouth at bedtime. (do not take for 3 days after taking colchicine) 12/13/18   Lada, Satira Anis, MD  BIOTIN PO Take 250 mg by mouth 2 (two) times daily.     [provider]  Calcium-Magnesium-Vitamin D (CALCIUM 1200+D3 PO) Take 1 capsule by mouth daily.    [provider]  COLACE 100 MG capsule Take 100 mg by mouth daily.  01/26/15   [provider]  colchicine 0.6 MG tablet Take two pills at onset of gout flare, followed by one more one hour later, max of three pills per flare. No atorvastatin x 3 days Patient not taking: Reported on 09/12/2018 06/13/18   Arnetha Courser, MD  levothyroxine (SYNTHROID, LEVOTHROID) 100 MCG tablet Take 1 tablet (100 mcg total) by mouth daily. 09/17/18   Arnetha Courser, MD  losartan (COZAAR) 100 MG tablet Take 1 tablet by mouth daily (Avoid  salt substitutes, no fruit smoothies.) 10/09/18   Lada, Satira Anis, MD  niacinamide 500 MG tablet Take 1 tablet (500 mg total) by mouth 2 (two) times daily with a meal. 11/13/17   Lada, Satira Anis, MD  Turmeric 450 MG CAPS Take 450 mg by mouth 2 (two) times daily.    [provider]  ursodiol (ACTIGALL) 300 MG capsule Take 300 mg by mouth daily.  12/16/14   [provider]  vitamin B-12 (CYANOCOBALAMIN) 250 MCG tablet Take 250 mcg by mouth daily.    [provider]    Allergies  Allergen Reactions  . Indomethacin Hives  . Ace Inhibitors Swelling    Other  reaction(s): SWELLING Facial swelling. Facial swelling.  . Augmentin [Amoxicillin-Pot Clavulanate] Other (See Comments)    Other reaction(s): Unknown  . Penicillins Nausea And Vomiting    Has patient had a PCN reaction causing immediate rash, facial/tongue/throat swelling, SOB or lightheadedness with hypotension: Yes Has patient had a PCN reaction causing severe rash involving mucus membranes or skin necrosis: No Has patient had a PCN reaction that required hospitalization: No Has patient had a PCN reaction occurring within the last 10 years: No If all of the above answers are "NO", then may proceed with Cephalosporin use.  [name] Sulfur Other (See Comments)    Family History  Problem Relation Age of Onset  . Emphysema Father   . Asthma Father   . Hypertension Daughter   . Heart disease Daughter        3 stents  . Diabetes Daughter   . Hypertension Son   . Cancer Son        prostate  . Diabetes Son   . Heart disease Daughter        heart attack, 2 stents  . Multiple sclerosis Daughter   . Diabetes Daughter   . Fibromyalgia Daughter   . Diabetes Daughter     Social History Social History   Tobacco Use  . Smoking status: Never Smoker  . Smokeless tobacco: Never Used  . Tobacco comment: smoking cessation materials not required  Substance Use Topics  . Alcohol use: No  . Drug use: No    Review of Systems Constitutional: Found to be febrile in the emergency department.  No fever known to patient. ENT: Negative for recent illness/congestion Cardiovascular: Negative for chest pain. Respiratory: Negative for shortness of breath. Gastrointestinal: Negative for abdominal pain, vomiting Genitourinary: Negative for urinary compaints Musculoskeletal: Right leg pain Skin: Mild scratch/puncture wound to right lower extremity with surrounding erythema tenderness and mild edema. Neurological: Negative for headache All other ROS  negative  ____________________________________________   PHYSICAL EXAM:  VITAL SIGNS: ED Triage Vitals  Enc Vitals Group     BP 02/23/19 1253 (!) 181/96     Pulse Rate 02/23/19 1253 (!) 112     Resp 02/23/19 1253 18     Temp 02/23/19 1253 (!) 100.4 F (38 C)     Temp Source 02/23/19 1253 Oral     SpO2 02/23/19 1253 97 %     Weight 02/23/19 1312 153 lb (69.4 kg)     Height 02/23/19 1312 5\' 4"  (1.626 m)     Head Circumference --      Peak Flow --      Pain Score 02/23/19 1312 7     Pain Loc --      Pain Edu? --      Excl. in [place]? --    Constitutional: Alert and oriented.  Well appearing and in no distress. Eyes: Normal exam ENT      Head: Normocephalic and atraumatic.      Mouth/Throat: Mucous membranes are moist. Cardiovascular: Normal rate, regular rhythm rate 100 bpm. Respiratory: Normal respiratory effort without tachypnea nor retractions. Breath sounds are clear  Gastrointestinal: Soft and nontender. No distention.   Musculoskeletal: Patient has 2 apparent puncture wounds to the right calf with surrounding erythema tenderness and mild edema. Neurologic:  Normal speech and language. No gross focal neurologic deficits Skin: Puncture wound/scratches as described above. Psychiatric: Mood and affect are normal.   ____________________________________________    EKG  EKG viewed and interpreted by myself shows a sinus tachycardia 102 bpm with a narrow QRS, normal axis, normal intervals, nonspecific but no concerning ST changes.  ____________________________________________   INITIAL IMPRESSION / ASSESSMENT AND PLAN / ED COURSE  Pertinent labs & imaging results that were available during my care of the patient were reviewed by me and considered in my medical decision making (see chart for details).   Patient presents to the emergency department with right leg pain redness swelling.  Patient states she was either bitten or scratched by her cat to her right lower extremity  yesterday.  There appears to be 2 puncture wounds with surrounding erythema edema and moderate tenderness to palpation over this area.  Differential would include cellulitis, sepsis, cat scratch disease.  Patient's labs have resulted showing significant leukocytosis along with fever 100.4 upon arrival mild tachycardia consistent with sepsis.  We will check cultures, broad labs, corona swab and plan to admit for IV antibiotics.  I discussed this with the patient who is agreeable to plan of care.  Caitlin Jenkins was evaluated in Emergency Department on 02/23/2019 for the symptoms described in the history of present illness. She was evaluated in the context of the global COVID-19 pandemic, which necessitated consideration that the patient might be at risk for infection with the SARS-CoV-2 virus that causes COVID-19. Institutional protocols and algorithms that pertain to the evaluation of patients at risk for COVID-19 are in a state of rapid change based on information released by regulatory bodies including the CDC and federal and state organizations. These policies and algorithms were followed during the patient's care in the ED.  CRITICAL CARE Performed by: Harvest Dark   Total critical care time: 30 minutes  Critical care time was exclusive of separately billable procedures and treating other patients.  Critical care was necessary to treat or prevent imminent or life-threatening deterioration.  Critical care was time spent personally by me on the following activities: development of treatment plan with patient and/or surrogate as well as nursing, discussions with consultants, evaluation of patient's response to treatment, examination of patient, obtaining history from patient or surrogate, ordering and performing treatments and interventions, ordering and review of laboratory studies, ordering and review of radiographic studies, pulse oximetry and re-evaluation of patient's  condition.  ____________________________________________   FINAL CLINICAL IMPRESSION(S) / ED DIAGNOSES  Sepsis Cellulitis   Harvest Dark, MD 02/23/19 1736

## 2019-02-23 NOTE — Progress Notes (Signed)
Pharmacy Antibiotic Note  Caitlin Jenkins is a 85 y.o. female admitted on 02/23/2019 with cellulitis.  Pharmacy has been consulted for vancomycin and levofloxacin dosing.  Levaquin consult for pharmacist to switch to Rocephin if patient has tolerated cephalosporin in the past.  Plan: Vancomycin 1000 mg IV x 1 given in ED. Will start vancomycin maintenance dose tomorrow at 1600. Vancomycin 750 mg IV Q 24 hrs. Goal AUC 400-550. Expected AUC: 458 SCr used: 1.04  Rocephin 1 g IV q24h  Height: 5\' 4"  (162.6 cm) Weight: 153 lb (69.4 kg) IBW/kg (Calculated) : 54.7  Temp (24hrs), Avg:99.7 F (37.6 C), Min:98.9 F (37.2 C), Max:100.4 F (38 C)  Recent Labs  Lab 02/23/19 1331 02/23/19 1707  WBC 19.6*  --   CREATININE 1.04*  --   LATICACIDVEN  --  1.6    Estimated Creatinine Clearance: 37.8 mL/min (A) (by C-G formula based on SCr of 1.04 mg/dL (H)).    Allergies  Allergen Reactions  . Indomethacin Hives  . Ace Inhibitors Swelling    Other reaction(s): SWELLING Facial swelling. Facial swelling.  . Augmentin [Amoxicillin-Pot Clavulanate] Other (See Comments)    Other reaction(s): Unknown  . Penicillins Nausea And Vomiting    Has patient had a PCN reaction causing immediate rash, facial/tongue/throat swelling, SOB or lightheadedness with hypotension: Yes Has patient had a PCN reaction causing severe rash involving mucus membranes or skin necrosis: No Has patient had a PCN reaction that required hospitalization: No Has patient had a PCN reaction occurring within the last 10 years: No If all of the above answers are "NO", then may proceed with Cephalosporin use.  . Sulfur Other (See Comments)    Antimicrobials this admission: Vancomycin 5/30 >> Ceftriaxone 5/30 >>  Dose adjustments this admission:   Microbiology results: 5/30 BCx: pending 5/30 UCx: pending    Thank you for allowing pharmacy to be a part of this patient's care.  Tawnya Crook, PharmD Pharmacy  Resident  02/23/2019 6:32 PM

## 2019-02-24 LAB — BASIC METABOLIC PANEL
Anion gap: 11 (ref 5–15)
BUN: 18 mg/dL (ref 8–23)
CO2: 20 mmol/L — ABNORMAL LOW (ref 22–32)
Calcium: 8.6 mg/dL — ABNORMAL LOW (ref 8.9–10.3)
Chloride: 103 mmol/L (ref 98–111)
Creatinine, Ser: 0.89 mg/dL (ref 0.44–1.00)
GFR calc Af Amer: >60 mL/min (ref >60)
GFR calc non Af Amer: 59 mL/min — ABNORMAL LOW (ref >60)
Glucose, Bld: 108 mg/dL — ABNORMAL HIGH (ref 70–99)
Potassium: 3.8 mmol/L (ref 3.5–5.1)
Sodium: 134 mmol/L — ABNORMAL LOW (ref 135–145)

## 2019-02-24 LAB — CBC
HCT: 33.4 % — ABNORMAL LOW (ref 36.0–46.0)
Hemoglobin: 11.1 g/dL — ABNORMAL LOW (ref 12.0–15.0)
MCH: 29.8 pg (ref 26.0–34.0)
MCHC: 33.2 g/dL (ref 30.0–36.0)
MCV: 89.5 fL (ref 80.0–100.0)
Platelets: 207 10*3/uL (ref 150–400)
RBC: 3.73 MIL/uL — ABNORMAL LOW (ref 3.87–5.11)
RDW: 13.8 % (ref 11.5–15.5)
WBC: 22.5 10*3/uL — ABNORMAL HIGH (ref 4.0–10.5)
nRBC: 0 % (ref 0.0–0.2)

## 2019-02-24 LAB — MAGNESIUM: Magnesium: 1.2 mg/dL — ABNORMAL LOW (ref 1.7–2.4)

## 2019-02-24 MED ORDER — TRAMADOL HCL 50 MG PO TABS
50.0000 mg | ORAL_TABLET | Freq: Four times a day (QID) | ORAL | Status: DC | PRN
  Administered 2019-02-24 (×2): 50 mg via ORAL
  Filled 2019-02-24 (×2): qty 1

## 2019-02-24 MED ORDER — TRAMADOL HCL 50 MG PO TABS
50.0000 mg | ORAL_TABLET | ORAL | Status: DC | PRN
  Administered 2019-02-24 – 2019-02-27 (×8): 50 mg via ORAL
  Filled 2019-02-24 (×8): qty 1

## 2019-02-24 MED ORDER — CYANOCOBALAMIN 500 MCG PO TABS
250.0000 ug | ORAL_TABLET | Freq: Every day | ORAL | Status: DC
  Administered 2019-02-24 – 2019-02-28 (×5): 250 ug via ORAL
  Filled 2019-02-24 (×5): qty 1

## 2019-02-24 MED ORDER — AMLODIPINE BESYLATE 5 MG PO TABS
2.5000 mg | ORAL_TABLET | Freq: Every day | ORAL | Status: DC
  Administered 2019-02-24 – 2019-02-28 (×5): 2.5 mg via ORAL
  Filled 2019-02-24 (×5): qty 1

## 2019-02-24 MED ORDER — NIACINAMIDE 500 MG PO TABS: 500.0000 mg | ORAL_TABLET | Freq: Two times a day (BID) | ORAL | Status: DC

## 2019-02-24 MED ORDER — ATORVASTATIN CALCIUM 10 MG PO TABS
10.0000 mg | ORAL_TABLET | Freq: Every day | ORAL | Status: DC
  Administered 2019-02-24 – 2019-02-27 (×4): 10 mg via ORAL
  Filled 2019-02-24 (×4): qty 1

## 2019-02-24 MED ORDER — LEVOTHYROXINE SODIUM 100 MCG PO TABS
100.0000 ug | ORAL_TABLET | ORAL | Status: DC
  Administered 2019-02-24 – 2019-02-28 (×5): 100 ug via ORAL
  Filled 2019-02-24 (×5): qty 1

## 2019-02-24 MED ORDER — MORPHINE SULFATE (PF) 2 MG/ML IV SOLN
2.0000 mg | INTRAVENOUS | Status: DC | PRN
  Administered 2019-02-24 – 2019-02-25 (×3): 2 mg via INTRAVENOUS
  Filled 2019-02-24 (×3): qty 1

## 2019-02-24 MED ORDER — URSODIOL 300 MG PO CAPS
300.0000 mg | ORAL_CAPSULE | Freq: Every day | ORAL | Status: DC
  Administered 2019-02-24 – 2019-02-28 (×5): 300 mg via ORAL
  Filled 2019-02-24 (×5): qty 1

## 2019-02-24 MED ORDER — DOCUSATE SODIUM 100 MG PO CAPS
100.0000 mg | ORAL_CAPSULE | Freq: Every day | ORAL | Status: DC
  Administered 2019-02-24 – 2019-02-28 (×5): 100 mg via ORAL
  Filled 2019-02-24 (×5): qty 1

## 2019-02-24 MED ORDER — CALCIUM CARBONATE-VITAMIN D 500-200 MG-UNIT PO TABS
1.0000 | ORAL_TABLET | Freq: Every day | ORAL | Status: DC
  Administered 2019-02-24 – 2019-02-28 (×5): 1 via ORAL
  Filled 2019-02-24 (×5): qty 1

## 2019-02-24 MED ORDER — BIOTIN 3 MG PO TABS: 250.0000 mg | ORAL_TABLET | Freq: Two times a day (BID) | ORAL | Status: DC

## 2019-02-24 MED ORDER — VITAMIN C 500 MG PO TABS
500.0000 mg | ORAL_TABLET | Freq: Two times a day (BID) | ORAL | Status: DC
  Administered 2019-02-24 – 2019-02-28 (×9): 500 mg via ORAL
  Filled 2019-02-24 (×9): qty 1

## 2019-02-24 MED ORDER — LOSARTAN POTASSIUM 50 MG PO TABS
100.0000 mg | ORAL_TABLET | Freq: Every day | ORAL | Status: DC
  Administered 2019-02-24 – 2019-02-28 (×5): 100 mg via ORAL
  Filled 2019-02-24 (×5): qty 2

## 2019-02-24 MED ORDER — TURMERIC 450 MG PO CAPS: 450.0000 mg | ORAL_CAPSULE | Freq: Two times a day (BID) | ORAL | Status: DC

## 2019-02-24 NOTE — Progress Notes (Signed)
[facility] [place] NAME: Caitlin Jenkins    MR#:  [id number]  DATE OF BIRTH:  1932-09-07  SUBJECTIVE:  CHIEF COMPLAINT:   Chief Complaint  Patient presents with  . Leg Pain  Patient seen and evaluated today Has tenderness in the right lower leg and itching No fever  REVIEW OF SYSTEMS:    ROS  CONSTITUTIONAL: No documented fever. No fatigue, weakness. No weight gain, no weight loss.  EYES: No blurry or double vision.  ENT: No tinnitus. No postnasal drip. No redness of the oropharynx.  RESPIRATORY: No cough, no wheeze, no hemoptysis. No dyspnea.  CARDIOVASCULAR: No chest pain. No orthopnea. No palpitations. No syncope.  GASTROINTESTINAL: No nausea, no vomiting or diarrhea. No abdominal pain. No melena or hematochezia.  GENITOURINARY: No dysuria or hematuria.  ENDOCRINE: No polyuria or nocturia. No heat or cold intolerance.  HEMATOLOGY: No anemia. No bruising. No bleeding.  INTEGUMENTARY: No rashes.  Scratch noted in the right foot lower extremity MUSCULOSKELETAL: No arthritis. No swelling. No gout.  NEUROLOGIC: No numbness, tingling, or ataxia. No seizure-type activity.  PSYCHIATRIC: No anxiety. No insomnia. No ADD.   DRUG ALLERGIES:   Allergies  Allergen Reactions  . Indomethacin Hives  . Ace Inhibitors Swelling    Other reaction(s): SWELLING Facial swelling. Facial swelling.  . Augmentin [Amoxicillin-Pot Clavulanate] Other (See Comments)    Other reaction(s): Unknown  . Penicillins Nausea And Vomiting    Has patient had a PCN reaction causing immediate rash, facial/tongue/throat swelling, SOB or lightheadedness with hypotension: Yes Has patient had a PCN reaction causing severe rash involving mucus membranes or skin necrosis: No Has patient had a PCN reaction that required hospitalization: No Has patient had a PCN reaction occurring within the last 10 years: No If all of the above answers are "NO", then may proceed with  Cephalosporin use.  . Sulfur Other (See Comments)    VITALS:  Blood pressure (!) 147/106, pulse (!) 113, temperature 100 F (37.8 C), temperature source Oral, resp. rate 20, height 5\' 4"  (1.626 m), weight 69.4 kg, SpO2 97 %.  PHYSICAL EXAMINATION:   Physical Exam  GENERAL:  85 y.o.-year-old patient lying in the bed with no acute distress.  EYES: Pupils equal, round, reactive to light and accommodation. No scleral icterus. Extraocular muscles intact.  HEENT: Head atraumatic, normocephalic. Oropharynx and nasopharynx clear.  NECK:  Supple, no jugular venous distention. No thyroid enlargement, no tenderness.  LUNGS: Normal breath sounds bilaterally, no wheezing, rales, rhonchi. No use of accessory muscles of respiration.  CARDIOVASCULAR: S1, S2 normal. No murmurs, rubs, or gallops.  ABDOMEN: Soft, nontender, nondistended. Bowel sounds present. No organomegaly or mass.  EXTREMITIES: No cyanosis, clubbing or edema b/l.    NEUROLOGIC: Cranial nerves II through XII are intact. No focal Motor or sensory deficits b/l.   PSYCHIATRIC: The patient is alert and oriented x 3.  SKIN: 2 bite marks noted on the right lower leg     LABORATORY PANEL:   CBC Recent Labs  Lab 02/24/19 0436  WBC 22.5*  HGB 11.1*  HCT 33.4*  PLT 207   ------------------------------------------------------------------------------------------------------------------ Chemistries  Recent Labs  Lab 02/23/19 1331 02/24/19 0436  NA 137 134*  K 4.4 3.8  CL 104 103  CO2 22 20*  GLUCOSE 107* 108*  BUN 25* 18  CREATININE 1.04* 0.89  CALCIUM 9.9 8.6*  MG  --  1.2*  AST 27  --   ALT 13  --  ALKPHOS 93  --   BILITOT 0.8  --    ------------------------------------------------------------------------------------------------------------------  Cardiac Enzymes No results for input(s): TROPONINI in the last 168 hours.  ------------------------------------------------------------------------------------------------------------------  RADIOLOGY:  No results found.   ASSESSMENT AND PLAN:   85 year old elderly female patient with history of CKD stage III, hyperlipidemia, hypertension, hypothyroidism currently under hospitalist service for cat bite  -Right lower extremity cellulitis secondary to cat scratch and bite Continue IV vancomycin and Levaquin antibiotics broad-spectrum for now Continue azithromycin Monitor WBC count  -Leukocytosis secondary to cellulitis from cat scratch fever Improving slowly  -DVT prophylaxis subcu Lovenox daily  -CKD stage III Monitor renal function Avoid nephrotoxic agents  All the records are reviewed and case discussed with Care Management/Social Worker. Management plans discussed with the patient, family and they are in agreement.  CODE STATUS: Full code  DVT Prophylaxis: SCDs  TOTAL TIME TAKING CARE OF THIS PATIENT: 38 minutes.   POSSIBLE D/C IN 1 to 2 DAYS, DEPENDING ON CLINICAL CONDITION.  Saundra Shelling M.D on 02/24/2019 at 10:49 AM  Between 7am to 6pm - Pager - [phone]  After 6pm go to [email or web address] - password [email or web address] [place] [facility]  Office  [phone]  CC: Primary care physician; Arnetha Courser, MD  Note: This dictation was prepared with Dragon dictation along with smaller phrase technology. Any transcriptional errors that result from this process are unintentional.

## 2019-02-24 NOTE — Progress Notes (Signed)
Patient's daughter Bronson Curb called and asked about patient's condition. Daughter updated on plan of care for the night. Questions were answered to daughter's satisfaction. Terrial Rhodes

## 2019-02-24 NOTE — Progress Notes (Signed)
Pharmacy Antibiotic Note  Caitlin Jenkins is a 85 y.o. female admitted on 02/23/2019 with cellulitis.  Pharmacy has been consulted for vancomycin and levofloxacin dosing.  Levaquin consult for pharmacist to switch to Rocephin if patient has tolerated cephalosporin in the past.  Vancomycin 1000mg  LD x 1 dose given 5/30  Plan: Scr slightly improved. Will continue Vancomycin 750 mg IV Q 24 hrs for now. Goal AUC 400-550. Expected AUC: 400 SCr used: 0.89 Css: 10.7   Rocephin 1 g IV q24h  Height: 5\' 4"  (162.6 cm) Weight: 153 lb (69.4 kg) IBW/kg (Calculated) : 54.7  Temp (24hrs), Avg:99.1 F (37.3 C), Min:98.1 F (36.7 C), Max:100.4 F (38 C)  Recent Labs  Lab 02/23/19 1331 02/23/19 1707 02/23/19 2314 02/24/19 0436  WBC 19.6*  --   --  22.5*  CREATININE 1.04*  --   --  0.89  LATICACIDVEN  --  1.6 1.3  --     Estimated Creatinine Clearance: 44.2 mL/min (by C-G formula based on SCr of 0.89 mg/dL).    Allergies  Allergen Reactions  . Indomethacin Hives  . Ace Inhibitors Swelling    Other reaction(s): SWELLING Facial swelling. Facial swelling.  . Augmentin [Amoxicillin-Pot Clavulanate] Other (See Comments)    Other reaction(s): Unknown  . Penicillins Nausea And Vomiting    Has patient had a PCN reaction causing immediate rash, facial/tongue/throat swelling, SOB or lightheadedness with hypotension: Yes Has patient had a PCN reaction causing severe rash involving mucus membranes or skin necrosis: No Has patient had a PCN reaction that required hospitalization: No Has patient had a PCN reaction occurring within the last 10 years: No If all of the above answers are "NO", then may proceed with Cephalosporin use.  . Sulfur Other (See Comments)    Antimicrobials this admission: Vancomycin 5/30 >> Ceftriaxone 5/30 >>  Dose adjustments this admission:   Microbiology results: 5/30 BCx: pending 5/30 UCx: pending    Thank you for allowing pharmacy to be a part of this  patient's care.  Pernell Dupre, PharmD, BCPS Clinical Pharmacist 02/24/2019 7:16 AM

## 2019-02-25 ENCOUNTER — Telehealth: Payer: Self-pay

## 2019-02-25 LAB — CBC
HCT: 32.2 % — ABNORMAL LOW (ref 36.0–46.0)
Hemoglobin: 10.7 g/dL — ABNORMAL LOW (ref 12.0–15.0)
MCH: 29.8 pg (ref 26.0–34.0)
MCHC: 33.2 g/dL (ref 30.0–36.0)
MCV: 89.7 fL (ref 80.0–100.0)
Platelets: 195 10*3/uL (ref 150–400)
RBC: 3.59 MIL/uL — ABNORMAL LOW (ref 3.87–5.11)
RDW: 13.5 % (ref 11.5–15.5)
WBC: 20.3 10*3/uL — ABNORMAL HIGH (ref 4.0–10.5)
nRBC: 0 % (ref 0.0–0.2)

## 2019-02-25 LAB — URINE CULTURE: Culture: NO GROWTH

## 2019-02-25 LAB — URIC ACID: Uric Acid, Serum: 6.2 mg/dL (ref 2.5–7.1)

## 2019-02-25 LAB — CREATININE, SERUM
Creatinine, Ser: 0.84 mg/dL (ref 0.44–1.00)
GFR calc Af Amer: >60 mL/min (ref >60)
GFR calc non Af Amer: >60 mL/min (ref >60)

## 2019-02-25 MED ORDER — METHYLPREDNISOLONE SODIUM SUCC 40 MG IJ SOLR
40.0000 mg | Freq: Every day | INTRAMUSCULAR | Status: DC
  Administered 2019-02-25: 09:00:00 40 mg via INTRAVENOUS
  Filled 2019-02-25: qty 1

## 2019-02-25 MED ORDER — MAGNESIUM SULFATE 2 GM/50ML IV SOLN
2.0000 g | Freq: Once | INTRAVENOUS | Status: AC
  Administered 2019-02-25: 13:00:00 2 g via INTRAVENOUS
  Filled 2019-02-25: qty 50

## 2019-02-25 MED ORDER — POLYETHYLENE GLYCOL 3350 17 G PO PACK
17.0000 g | PACK | Freq: Every day | ORAL | Status: DC
  Administered 2019-02-25 – 2019-02-28 (×4): 17 g via ORAL
  Filled 2019-02-25 (×4): qty 1

## 2019-02-25 MED ORDER — ALLOPURINOL 300 MG PO TABS
300.0000 mg | ORAL_TABLET | Freq: Every day | ORAL | Status: DC
  Administered 2019-02-25 – 2019-02-26 (×2): 300 mg via ORAL
  Filled 2019-02-25 (×2): qty 3
  Filled 2019-02-25 (×2): qty 1
  Filled 2019-02-25: qty 3
  Filled 2019-02-25: qty 1

## 2019-02-25 MED ORDER — COLCHICINE 0.6 MG PO TABS
0.6000 mg | ORAL_TABLET | Freq: Two times a day (BID) | ORAL | Status: DC
  Administered 2019-02-25 – 2019-02-28 (×7): 0.6 mg via ORAL
  Filled 2019-02-25 (×7): qty 1

## 2019-02-25 NOTE — Progress Notes (Signed)
[facility] [place] NAME: Caitlin Jenkins    MR#:  [id number]  DATE OF BIRTH:  February 25, 1932  SUBJECTIVE:  CHIEF COMPLAINT:   Chief Complaint  Patient presents with  . Leg Pain  Patient seen and evaluated today Decreased pain in the right leg Has tenderness in the knees bilaterally and appear warm No fever  REVIEW OF SYSTEMS:    ROS  CONSTITUTIONAL: No documented fever. No fatigue, weakness. No weight gain, no weight loss.  EYES: No blurry or double vision.  ENT: No tinnitus. No postnasal drip. No redness of the oropharynx.  RESPIRATORY: No cough, no wheeze, no hemoptysis. No dyspnea.  CARDIOVASCULAR: No chest pain. No orthopnea. No palpitations. No syncope.  GASTROINTESTINAL: No nausea, no vomiting or diarrhea. No abdominal pain. No melena or hematochezia.  GENITOURINARY: No dysuria or hematuria.  ENDOCRINE: No polyuria or nocturia. No heat or cold intolerance.  HEMATOLOGY: No anemia. No bruising. No bleeding.  INTEGUMENTARY: No rashes.  Scratch noted in the right foot lower extremity MUSCULOSKELETAL: No arthritis. No swelling. No gout.  Pain in the knees and swelling NEUROLOGIC: No numbness, tingling, or ataxia. No seizure-type activity.  PSYCHIATRIC: No anxiety. No insomnia. No ADD.   DRUG ALLERGIES:   Allergies  Allergen Reactions  . Indomethacin Hives  . Ace Inhibitors Swelling    Other reaction(s): SWELLING Facial swelling. Facial swelling.  . Augmentin [Amoxicillin-Pot Clavulanate] Other (See Comments)    Other reaction(s): Unknown  . Penicillins Nausea And Vomiting    Has patient had a PCN reaction causing immediate rash, facial/tongue/throat swelling, SOB or lightheadedness with hypotension: Yes Has patient had a PCN reaction causing severe rash involving mucus membranes or skin necrosis: No Has patient had a PCN reaction that required hospitalization: No Has patient had a PCN reaction occurring within the last 10  years: No If all of the above answers are "NO", then may proceed with Cephalosporin use.  . Sulfur Other (See Comments)    VITALS:  Blood pressure (!) 141/75, pulse 89, temperature 98.1 F (36.7 C), temperature source Oral, resp. rate 20, height 5\' 4"  (1.626 m), weight 69.4 kg, SpO2 94 %.  PHYSICAL EXAMINATION:   Physical Exam  GENERAL:  85 y.o.-year-old patient lying in the bed with no acute distress.  EYES: Pupils equal, round, reactive to light and accommodation. No scleral icterus. Extraocular muscles intact.  HEENT: Head atraumatic, normocephalic. Oropharynx and nasopharynx clear.  NECK:  Supple, no jugular venous distention. No thyroid enlargement, no tenderness.  LUNGS: Normal breath sounds bilaterally, no wheezing, rales, rhonchi. No use of accessory muscles of respiration.  CARDIOVASCULAR: S1, S2 normal. No murmurs, rubs, or gallops.  ABDOMEN: Soft, nontender, nondistended. Bowel sounds present. No organomegaly or mass.  EXTREMITIES: No cyanosis, clubbing or edema b/l.   Left knee tender and warm Swelling and pain in the right knee too NEUROLOGIC: Cranial nerves II through XII are intact. No focal Motor or sensory deficits b/l.   PSYCHIATRIC: The patient is alert and oriented x 3.  SKIN: 2 bite marks noted on the right lower leg     LABORATORY PANEL:   CBC Recent Labs  Lab 02/25/19 0442  WBC 20.3*  HGB 10.7*  HCT 32.2*  PLT 195   ------------------------------------------------------------------------------------------------------------------ Chemistries  Recent Labs  Lab 02/23/19 1331 02/24/19 0436 02/25/19 0442  NA 137 134*  --   K 4.4 3.8  --   CL 104 103  --   CO2 22 20*  --  GLUCOSE 107* 108*  --   BUN 25* 18  --   CREATININE 1.04* 0.89 0.84  CALCIUM 9.9 8.6*  --   MG  --  1.2*  --   AST 27  --   --   ALT 13  --   --   ALKPHOS 93  --   --   BILITOT 0.8  --   --     ------------------------------------------------------------------------------------------------------------------  Cardiac Enzymes No results for input(s): TROPONINI in the last 168 hours. ------------------------------------------------------------------------------------------------------------------  RADIOLOGY:  No results found.   ASSESSMENT AND PLAN:   85 year old elderly female patient with history of CKD stage III, hyperlipidemia, hypertension, hypothyroidism currently under hospitalist service for cat bite  -Right lower extremity cellulitis secondary to cat scratch and bite Continue IV Rocephin antibiotic Continue azithromycin Monitor WBC count  -Leukocytosis secondary to cellulitis from cat scratch fever Improving slowly  -Acute flareup of gout Start patient on oral allopurinol, colchicine IV Solu-Medrol 40 mg daily Dementia  -DVT prophylaxis subcu Lovenox daily  -CKD stage III Monitor renal function Avoid nephrotoxic agents  All the records are reviewed and case discussed with Care Management/Social Worker. Management plans discussed with the patient, family and they are in agreement.  CODE STATUS: Full code  DVT Prophylaxis: SCDs  TOTAL TIME TAKING CARE OF THIS PATIENT: 37 minutes.   POSSIBLE D/C IN 1 to 2 DAYS, DEPENDING ON CLINICAL CONDITION.  Saundra Shelling M.D on 02/25/2019 at 12:29 PM  Between 7am to 6pm - Pager - [phone]  After 6pm go to [email or web address] - password [email or web address] [facility]  Office  [phone]  CC: Primary care physician; Arnetha Courser, MD  Note: This dictation was prepared with Dragon dictation along with smaller phrase technology. Any transcriptional errors that result from this process are unintentional.

## 2019-02-25 NOTE — Telephone Encounter (Signed)
Patient's daughter was notified.

## 2019-02-25 NOTE — Telephone Encounter (Signed)
Copied from [place] [phone]. Topic: General - Other >> Feb 25, 2019  9:21 AM Valla Leaver wrote: Reason for CRM: Daughter, [name], calling to notify Dr. Sanda Klein that her mom was attacked by her cat and is hospitalized with cellulitis in her leg. [name] also needs to know if the patient has a "do not resuscitate" request anywhere on file with your office because she was notified that the hospital indicated this for the patient. However, [name] does not believe her mother ever agreed to not be resuscitated. Please advise.

## 2019-02-25 NOTE — Telephone Encounter (Signed)
Please check for documentation of DNR and inform them. It is noted in code status.

## 2019-02-26 ENCOUNTER — Inpatient Hospital Stay: Payer: PPO

## 2019-02-26 ENCOUNTER — Ambulatory Visit: Payer: Self-pay | Admitting: Family Medicine

## 2019-02-26 LAB — MAGNESIUM: Magnesium: 2 mg/dL (ref 1.7–2.4)

## 2019-02-26 LAB — CBC
HCT: 31.3 % — ABNORMAL LOW (ref 36.0–46.0)
Hemoglobin: 10.4 g/dL — ABNORMAL LOW (ref 12.0–15.0)
MCH: 30.1 pg (ref 26.0–34.0)
MCHC: 33.2 g/dL (ref 30.0–36.0)
MCV: 90.5 fL (ref 80.0–100.0)
Platelets: 185 10*3/uL (ref 150–400)
RBC: 3.46 MIL/uL — ABNORMAL LOW (ref 3.87–5.11)
RDW: 13.4 % (ref 11.5–15.5)
WBC: 19.3 10*3/uL — ABNORMAL HIGH (ref 4.0–10.5)
nRBC: 0 % (ref 0.0–0.2)

## 2019-02-26 LAB — BASIC METABOLIC PANEL
Anion gap: 7 (ref 5–15)
BUN: 17 mg/dL (ref 8–23)
CO2: 20 mmol/L — ABNORMAL LOW (ref 22–32)
Calcium: 8.1 mg/dL — ABNORMAL LOW (ref 8.9–10.3)
Chloride: 101 mmol/L (ref 98–111)
Creatinine, Ser: 0.91 mg/dL (ref 0.44–1.00)
GFR calc Af Amer: >60 mL/min (ref >60)
GFR calc non Af Amer: 58 mL/min — ABNORMAL LOW (ref >60)
Glucose, Bld: 132 mg/dL — ABNORMAL HIGH (ref 70–99)
Potassium: 4.2 mmol/L (ref 3.5–5.1)
Sodium: 128 mmol/L — ABNORMAL LOW (ref 135–145)

## 2019-02-26 MED ORDER — PREDNISONE 20 MG PO TABS
10.0000 mg | ORAL_TABLET | Freq: Every day | ORAL | Status: DC
  Filled 2019-02-26: qty 1

## 2019-02-26 MED ORDER — SODIUM CHLORIDE 0.9 % IV SOLN
INTRAVENOUS | Status: DC | PRN
  Administered 2019-02-26: 17:00:00 1000 mL via INTRAVENOUS

## 2019-02-26 MED ORDER — CALCIUM GLUCONATE-NACL 2-0.675 GM/100ML-% IV SOLN
2.0000 g | Freq: Once | INTRAVENOUS | Status: AC
  Administered 2019-02-26: 12:00:00 2000 mg via INTRAVENOUS
  Filled 2019-02-26: qty 100

## 2019-02-26 MED ORDER — SODIUM CHLORIDE 1 G PO TABS
1.0000 g | ORAL_TABLET | Freq: Three times a day (TID) | ORAL | Status: DC
  Administered 2019-02-26 – 2019-02-28 (×6): 1 g via ORAL
  Filled 2019-02-26 (×9): qty 1

## 2019-02-26 MED ORDER — AZITHROMYCIN 250 MG PO TABS
250.0000 mg | ORAL_TABLET | Freq: Every day | ORAL | Status: AC
  Administered 2019-02-26 – 2019-02-27 (×2): 250 mg via ORAL
  Filled 2019-02-26 (×2): qty 1

## 2019-02-26 NOTE — Evaluation (Signed)
Physical Therapy Evaluation Patient Details Name: Caitlin Jenkins MRN: [id number] DOB: 04-26-1932 Today's Date: 02/26/2019   History of Present Illness  Patient is a pleasant 85 year old female admitted for cellulitis s/p cat scratch of RLE. PMH includes leukocytosis, atherosclerosis, cirrhosis, HTN, CKD 3, skin cancer, gout, and HLD.   Clinical Impression  Patient is a pleasant 85 year old female who presents with diffuse weakness and pain of bilateral LE with RLE>LLE. Patient has limited tolerance to touch to R foot/ankle and has noted swelling. Imaging was cleared for ankle and knees. Patient required Min A for bed mobility due to limited ability to active move RLE. Patient tolerated EOB without support however required Min/Mod A for STS transfer and Mod A with max cueing for SPT to [facility]. Patient has limited tolerance for weightbearing on RLE and limited ability to clear/muscle activate with RLE. Patient will benefit from skilled PT while hospitalized to address deficits in strength, ROM, transfers, and mobility to decrease falls risk. Patient agreeable to SNF placement upon discharge from hospital due to limited mobility, high fall risk, and need for skilled PT to return to PLOF.     Follow Up Recommendations SNF    Equipment Recommendations  None recommended by PT    Recommendations for Other Services       Precautions / Restrictions Precautions Precautions: Fall Restrictions Weight Bearing Restrictions: No      Mobility  Bed Mobility Overal bed mobility: Needs Assistance Bed Mobility: Supine to Sit;Sit to Supine     Supine to sit: Min assist Sit to supine: Min assist   General bed mobility comments: Patient requires Min A for RLE assistance as well as trunk to obtain sitting balance EOB, unable to lift RLE into bed without assistance.   Transfers Overall transfer level: Needs assistance   Transfers: Sit to/from Stand;Stand Pivot Transfers Sit to Stand: Min assist;Mod  assist Stand pivot transfers: Mod assist       General transfer comment: Patient requires Min/Mod A for STS from raised surface with Mod A for SPT with max verbal cueing for sequencing, encouragement for continuation of movement, and breathing. Limited ability to weightbear on RLE  Ambulation/Gait             General Gait Details: not safe at this time due to limited ability to transfer/weightbear on RLE  Stairs            Wheelchair Mobility    Modified Rankin (Stroke Patients Only)       Balance Overall balance assessment: Needs assistance Sitting-balance support: Single extremity supported Sitting balance-Leahy Scale: Fair Sitting balance - Comments: able to sit without LOB, noted guarding of RLE Postural control: Left lateral lean(away from painful RLE) Standing balance support: Bilateral upper extremity supported Standing balance-Leahy Scale: Poor Standing balance comment: Patient requires heavy UE assistance due to limited ability to actively move RLE and tolerate weight onto RLE                             Pertinent Vitals/Pain Pain Assessment: 0-10 Pain Score: 6  Pain Location: R foot/leg Pain Descriptors / Indicators: Throbbing Pain Intervention(s): Limited activity within patient's tolerance;Monitored during session;Repositioned    Home Living Family/patient expects to be discharged to:: Private residence Living Arrangements: Alone Available Help at Discharge: (says she has some daughters who could maybe help ) Type of Home: House Home Access: Stairs to enter Entrance Stairs-Rails: Right;Left;Can reach both Entrance  Stairs-Number of Steps: 3 Home Layout: Two level;Other (Comment);Laundry or work area in [place]: Environmental consultant - 2 wheels;Cane - single point;Shower seat;Grab bars - tub/shower Additional Comments: has the equipment but does not use it, is not sure where her shower chair is.     Prior Function Level of  Independence: Independent         Comments: Patient reports she is independent without an AD, plays with her grandchildren, has an active social life, not limited in mobility or ADLs. Is a retired Engineer, technical sales   Dominant Hand: Right    Extremity/Trunk Assessment   Upper Extremity Assessment Upper Extremity Assessment: Overall WFL for tasks assessed    Lower Extremity Assessment Lower Extremity Assessment: RLE deficits/detail;LLE deficits/detail RLE Deficits / Details: grossly 2+/5 strength, painful to reach partial range of motion with knee and ankle, able to perform abd/add with 4-/5 strength RLE: Unable to fully assess due to pain RLE Sensation: (pain with touch) RLE Coordination: decreased gross motor LLE Deficits / Details: painful with knee mobility, grossly 3/5 knee 4-/5 hip  LLE: Unable to fully assess due to pain LLE Coordination: decreased gross motor       Communication   Communication: HOH  Cognition Arousal/Alertness: Awake/alert Behavior During Therapy: WFL for tasks assessed/performed Overall Cognitive Status: Within Functional Limits for tasks assessed                                 General Comments: alert and oriented, eager to participate      General Comments General comments (skin integrity, edema, etc.): edema and wound to RLE from scratch    Exercises Other Exercises Other Exercises: patient educated on safe transfers to<>from bedside commode, required assistance with cleaning after utilizing toilet, and Mod A to return to bed.    Assessment/Plan    PT Assessment Patient needs continued PT services  PT Problem List Decreased strength;Decreased range of motion;Decreased activity tolerance;Decreased balance;Decreased knowledge of use of DME;Decreased coordination;Decreased mobility;Pain       PT Treatment Interventions DME instruction;Gait training;Stair training;Therapeutic exercise;Therapeutic  activities;Functional mobility training;Balance training;Neuromuscular re-education;Manual techniques;Patient/family education    PT Goals (Current goals can be found in the Care Plan section)  Acute Rehab PT Goals Patient Stated Goal: to be able to walk again PT Goal Formulation: With patient Time For Goal Achievement: 03/12/19 Potential to Achieve Goals: Fair    Frequency Min 2X/week   Barriers to discharge Decreased caregiver support;Inaccessible home environment patient lives alone and has steps to enter her house. She will require assistance and therapy     Co-evaluation               AM-PAC PT "6 Clicks" Mobility  Outcome Measure Help needed turning from your back to your side while in a flat bed without using bedrails?: A Little Help needed moving from lying on your back to sitting on the side of a flat bed without using bedrails?: A Lot Help needed moving to and from a bed to a chair (including a wheelchair)?: A Lot Help needed standing up from a chair using your arms (e.g., wheelchair or bedside chair)?: A Little Help needed to walk in hospital room?: A Lot Help needed climbing 3-5 steps with a railing? : Total 6 Click Score: 13    End of Session Equipment Utilized During Treatment: Gait belt Activity Tolerance: Patient limited by pain  Patient left: in bed;with bed alarm set;with call bell/phone within reach Nurse Communication: Mobility status PT Visit Diagnosis: Unsteadiness on feet (R26.81);Other abnormalities of gait and mobility (R26.89);Muscle weakness (generalized) (M62.81);Difficulty in walking, not elsewhere classified (R26.2);Pain Pain - Right/Left: Right Pain - part of body: Leg    Time: 1610-9604 PT Time Calculation (min) (ACUTE ONLY): 39 min   Charges:   PT Evaluation $PT Eval Low Complexity: 1 Low PT Treatments $Therapeutic Activity: 8-22 mins        Janna Arch, PT, DPT    Janna Arch 02/26/2019, 1:39 PM

## 2019-02-26 NOTE — Progress Notes (Signed)
RN update Caitlin Jenkins, pts daughter.

## 2019-02-26 NOTE — Care Management Important Message (Signed)
Important Message  Patient Details  Name: Caitlin Jenkins MRN: [id number] Date of Birth: August 27, 1932   Medicare Important Message Given:  Yes    Dannette Barbara 02/26/2019, 1:53 PM

## 2019-02-26 NOTE — Chronic Care Management (AMB) (Signed)
Chronic Care Management   Note  02/26/2019 Name: IMANI FIEBELKORN MRN: [id number] DOB: 09/26/1932  Nakiesha KATELYNE GALSTER is a 85 y.o. year old female who is a primary care patient of Lada, Satira Anis, MD. I reached out to Milagros Loll by phone today in response to a referral sent by Ms. Dorie W Blickenstaff's health plan.    Ms. Sobecki was given information about Chronic Care Management services today including:  1. CCM service includes personalized support from designated clinical staff supervised by her physician, including individualized plan of care and coordination with other care providers 2. 24/7 contact phone numbers for assistance for urgent and routine care needs. 3. Service will only be billed when office clinical staff spend 20 minutes or more in a month to coordinate care. 4. Only one practitioner may furnish and bill the service in a calendar month. 5. The patient may stop CCM services at any time (effective at the end of the month) by phone call to the office staff. 6. The patient will be responsible for cost sharing (co-pay) of up to 20% of the service fee (after annual deductible is met).  Patient agreed to services and verbal consent obtained.   Follow up plan: Telephone appointment with CCM team member scheduled for: 03/08/2019  [place]  [email or web address]   ??[phone]

## 2019-02-26 NOTE — Progress Notes (Signed)
[facility] [place] NAME: Caitlin Jenkins    MR#:  [id number]  DATE OF BIRTH:  July 23, 1932  SUBJECTIVE:  CHIEF COMPLAINT:   Chief Complaint  Patient presents with  . Leg Pain  Patient seen and evaluated today swelling in right ankle Pain in right leg Tenderness in right knee and left knee No fever  REVIEW OF SYSTEMS:    ROS  CONSTITUTIONAL: No documented fever. No fatigue, weakness. No weight gain, no weight loss.  EYES: No blurry or double vision.  ENT: No tinnitus. No postnasal drip. No redness of the oropharynx.  RESPIRATORY: No cough, no wheeze, no hemoptysis. No dyspnea.  CARDIOVASCULAR: No chest pain. No orthopnea. No palpitations. No syncope.  GASTROINTESTINAL: No nausea, no vomiting or diarrhea. No abdominal pain. No melena or hematochezia.  GENITOURINARY: No dysuria or hematuria.  ENDOCRINE: No polyuria or nocturia. No heat or cold intolerance.  HEMATOLOGY: No anemia. No bruising. No bleeding.  INTEGUMENTARY: No rashes.  Scratch noted in the right leg lower extremity MUSCULOSKELETAL: No arthritis. No swelling. No gout.  Pain in the knees and swelling NEUROLOGIC: No numbness, tingling, or ataxia. No seizure-type activity.  PSYCHIATRIC: No anxiety. No insomnia. No ADD.   DRUG ALLERGIES:   Allergies  Allergen Reactions  . Indomethacin Hives  . Ace Inhibitors Swelling    Other reaction(s): SWELLING Facial swelling. Facial swelling.  . Augmentin [Amoxicillin-Pot Clavulanate] Other (See Comments)    Other reaction(s): Unknown  . Penicillins Nausea And Vomiting    Has patient had a PCN reaction causing immediate rash, facial/tongue/throat swelling, SOB or lightheadedness with hypotension: Yes Has patient had a PCN reaction causing severe rash involving mucus membranes or skin necrosis: No Has patient had a PCN reaction that required hospitalization: No Has patient had a PCN reaction occurring within the last 10 years: No  If all of the above answers are "NO", then may proceed with Cephalosporin use.  . Sulfur Other (See Comments)    VITALS:  Blood pressure 138/80, pulse 79, temperature 97.6 F (36.4 C), temperature source Oral, resp. rate 20, height 5\' 4"  (1.626 m), weight 69.4 kg, SpO2 97 %.  PHYSICAL EXAMINATION:   Physical Exam  GENERAL:  85 y.o.-year-old patient lying in the bed with no acute distress.  EYES: Pupils equal, round, reactive to light and accommodation. No scleral icterus. Extraocular muscles intact.  HEENT: Head atraumatic, normocephalic. Oropharynx and nasopharynx clear.  NECK:  Supple, no jugular venous distention. No thyroid enlargement, no tenderness.  LUNGS: Normal breath sounds bilaterally, no wheezing, rales, rhonchi. No use of accessory muscles of respiration.  CARDIOVASCULAR: S1, S2 normal. No murmurs, rubs, or gallops.  ABDOMEN: Soft, nontender, nondistended. Bowel sounds present. No organomegaly or mass.  EXTREMITIES: No cyanosis, clubbing or edema b/l.   Left knee tender and warm Swelling and pain in the right knee too NEUROLOGIC: Cranial nerves II through XII are intact. No focal Motor or sensory deficits b/l.   PSYCHIATRIC: The patient is alert and oriented x 3.  SKIN: 2 bite marks noted on the right lower leg     LABORATORY PANEL:   CBC Recent Labs  Lab 02/26/19 0310  WBC 19.3*  HGB 10.4*  HCT 31.3*  PLT 185   ------------------------------------------------------------------------------------------------------------------ Chemistries  Recent Labs  Lab 02/23/19 1331  02/26/19 0310  NA 137   < > 128*  K 4.4   < > 4.2  CL 104   < > 101  CO2 22   < >  20*  GLUCOSE 107*   < > 132*  BUN 25*   < > 17  CREATININE 1.04*   < > 0.91  CALCIUM 9.9   < > 8.1*  MG  --    < > 2.0  AST 27  --   --   ALT 13  --   --   ALKPHOS 93  --   --   BILITOT 0.8  --   --    < > = values in this interval not displayed.    ------------------------------------------------------------------------------------------------------------------  Cardiac Enzymes No results for input(s): TROPONINI in the last 168 hours. ------------------------------------------------------------------------------------------------------------------  RADIOLOGY:  No results found.   ASSESSMENT AND PLAN:   85 year old elderly female patient with history of CKD stage III, hyperlipidemia, hypertension, hypothyroidism currently under hospitalist service for cat bite  -Right lower extremity cellulitis secondary to cat scratch and bite Improving slowly Continue IV Rocephin antibiotic Continue azithromycin Monitor WBC count  -Leukocytosis secondary to cellulitis from cat scratch fever Improving slowly Probably elevated from steroids  -Acute flareup of gout Start patient on oral allopurinol, colchicine Discontinue IV Solu-Medrol and switch to oral steroids Dementia  -Pain in the right and left knee X-rays of the knee to assess for joint effusion X-ray of the right ankle to assess for joint effusion and any fracture  -DVT prophylaxis subcu Lovenox daily  -CKD stage III Monitor renal function Avoid nephrotoxic agents  -Hyponatremia Oral salt tablets  -Hypocalcemia Replace calcium  All the records are reviewed and case discussed with Care Management/Social Worker. Management plans discussed with the patient, family and they are in agreement.  CODE STATUS: Full code  DVT Prophylaxis: SCDs  TOTAL TIME TAKING CARE OF THIS PATIENT: 39 minutes.   POSSIBLE D/C IN 1 to 2 DAYS, DEPENDING ON CLINICAL CONDITION.  Saundra Shelling M.D on 02/26/2019 at 9:55 AM  Between 7am to 6pm - Pager - [phone]  After 6pm go to [email or web address] - password [email or web address] [facility]  Office  [phone]  CC: Primary care physician; Arnetha Courser, MD  Note: This dictation was prepared with Dragon dictation along with  smaller phrase technology. Any transcriptional errors that result from this process are unintentional.

## 2019-02-27 LAB — BASIC METABOLIC PANEL
Anion gap: 7 (ref 5–15)
BUN: 20 mg/dL (ref 8–23)
CO2: 21 mmol/L — ABNORMAL LOW (ref 22–32)
Calcium: 8.8 mg/dL — ABNORMAL LOW (ref 8.9–10.3)
Chloride: 106 mmol/L (ref 98–111)
Creatinine, Ser: 0.96 mg/dL (ref 0.44–1.00)
GFR calc Af Amer: >60 mL/min (ref >60)
GFR calc non Af Amer: 54 mL/min — ABNORMAL LOW (ref >60)
Glucose, Bld: 85 mg/dL (ref 70–99)
Potassium: 4 mmol/L (ref 3.5–5.1)
Sodium: 134 mmol/L — ABNORMAL LOW (ref 135–145)

## 2019-02-27 LAB — CBC
HCT: 31.3 % — ABNORMAL LOW (ref 36.0–46.0)
Hemoglobin: 10.3 g/dL — ABNORMAL LOW (ref 12.0–15.0)
MCH: 29.5 pg (ref 26.0–34.0)
MCHC: 32.9 g/dL (ref 30.0–36.0)
MCV: 89.7 fL (ref 80.0–100.0)
Platelets: 224 10*3/uL (ref 150–400)
RBC: 3.49 MIL/uL — ABNORMAL LOW (ref 3.87–5.11)
RDW: 13.5 % (ref 11.5–15.5)
WBC: 13.5 10*3/uL — ABNORMAL HIGH (ref 4.0–10.5)
nRBC: 0 % (ref 0.0–0.2)

## 2019-02-27 MED ORDER — FLUTICASONE PROPIONATE 50 MCG/ACT NA SUSP
1.0000 | Freq: Every day | NASAL | Status: DC
  Administered 2019-02-27 – 2019-02-28 (×2): 1 via NASAL
  Filled 2019-02-27: qty 16

## 2019-02-27 MED ORDER — PREDNISONE 20 MG PO TABS
30.0000 mg | ORAL_TABLET | Freq: Every day | ORAL | Status: DC
  Administered 2019-02-27 – 2019-02-28 (×2): 30 mg via ORAL
  Filled 2019-02-27 (×2): qty 2

## 2019-02-27 MED ORDER — ALLOPURINOL 100 MG PO TABS
100.0000 mg | ORAL_TABLET | Freq: Every day | ORAL | Status: DC
  Administered 2019-02-27 – 2019-02-28 (×2): 100 mg via ORAL
  Filled 2019-02-27 (×3): qty 1

## 2019-02-27 NOTE — TOC Initial Note (Signed)
Transition of Care [facility]) - Initial/Assessment Note    Patient Details  Name: Caitlin Jenkins MRN: [id number] Date of Birth: 1932-08-26  Transition of Care [facility]) CM/SW Contact:    Beverly Sessions, RN Phone Number: 02/27/2019, 2:15 PM  Clinical Narrative:                 Patient admitted with Right lower extremity cellulitis secondary to cat scratch and bite.  Baseline patient lives at home alone and is completely independent. PCP Lada.  Pharmacy [facility], and [facility].  Patient denies issues obtaining medications  PT has assessed patient.  Patient has limited mobility due to pain.  PT is recommending SNF.  Patient declines and state that she will be discharging to her daughter Caitlin Jenkins's house.  Patient request that I call her daughter to discuss home health.  Patient states that she has a chair lift, RW, cane in shower chair in her home.  RNCM spoke with daughter Caitlin Jenkins, and she confirms that patient will be discharging to her home at [place]. Caitlin Jenkins states they will bringing all of the DME to her home, and also have 2 different wheelchairs.  Caitlin Jenkins states that her husband has recently used [place] and would like to use them if they are available.  RNCM has left a a voicemail for intake at [place].  Awaiting return call.   Expected Discharge Plan: [place] Barriers to Discharge: Continued Medical Work up   Patient Goals and CMS Choice Patient states their goals for this hospitalization and ongoing recovery are:: "i dont want to go to rehab" CMS Medicare.gov Compare Post Acute Care list provided to:: Patient Represenative (must comment)(daughter Caitlin Jenkins)    Expected Discharge Plan and Services Expected Discharge Plan: [place]   Discharge Planning Services: CM Consult Post Acute Care Choice: [place] arrangements for the past 2 months: Single Family Home Expected  Discharge Date: 02/24/19                         HH Arranged: RN, PT   Date [facility] Agency Contacted: 02/27/19 Time [place]: 1414 Representative spoke with at [place]: voicemail left with intake at [place] Arrangements/Services Living arrangements for the past 2 months: [place] with:: Self Patient language and need for interpreter reviewed:: Yes Do you feel safe going back to the place where you live?: Yes      Need for Family Participation in Patient Care: Yes (Comment) Care giver support system in place?: Yes (comment)   Criminal Activity/Legal Involvement Pertinent to Current Situation/Hospitalization: No - Comment as needed  Activities of Daily Living Home Assistive Devices/Equipment: None ADL Screening (condition at time of admission) Patient's cognitive ability adequate to safely complete daily activities?: Yes Is the patient deaf or have difficulty hearing?: Yes Does the patient have difficulty seeing, even when wearing glasses/contacts?: No Does the patient have difficulty concentrating, remembering, or making decisions?: No Patient able to express need for assistance with ADLs?: Yes Does the patient have difficulty dressing or bathing?: No Independently performs ADLs?: Yes (appropriate for developmental age) Does the patient have difficulty walking or climbing stairs?: No Weakness of Legs: None Weakness of Arms/Hands: None  Permission Sought/Granted                  Emotional  Assessment Appearance:: Appears stated age Attitude/Demeanor/Rapport: Gracious Affect (typically observed): Accepting   Alcohol / Substance Use: Never Used Psych Involvement: No (comment)  Admission diagnosis:  Cat scratch fever [A28.1] Cellulitis of right lower extremity [L03.115] Sepsis, due to unspecified organism, unspecified whether acute organ dysfunction present [facility]) [A41.9] Patient Active Problem List   Diagnosis Date  Noted  . Cellulitis 02/23/2019  . History of basal cell carcinoma (BCC) 09/12/2018  . Hyperparathyroidism, secondary renal ([place]) 07/11/2018  . Primary biliary cholangitis ([place]) 03/20/2017  . Overweight (BMI 25.0-29.9) 11/29/2015  . Carotid artery disease ([place]) 10/09/2015  . Medication monitoring encounter 10/09/2015  . Nail abnormalities 10/09/2015  . Arthralgia of multiple joints 10/09/2015  . Essential hypertension, benign 06/19/2015  . 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 Hyperlipidemia   . Hypothyroidism   . Gout   . CKD (chronic kidney disease) stage 3, GFR 30-59 ml/min (HCC)   . Hiatal hernia 06/26/2014   PCP:  Arnetha Courser, MD Pharmacy:   [place], [place] [place] [address] Phone: [phone] Fax: [phone]  [facility] ([place], [place] [address] Phone: [phone] Fax: [phone]     Social Determinants of Health (SDOH) Interventions    Readmission Risk Interventions No flowsheet data found.

## 2019-02-27 NOTE — Progress Notes (Signed)
[place] at [place] NAME: Caitlin Jenkins    MR#:  [id number]  DATE OF BIRTH:  1932/01/09  SUBJECTIVE:  CHIEF COMPLAINT:   Chief Complaint  Patient presents with  . Leg Pain  Patient seen and evaluated today swelling in right ankle still present Pain in right ankle Unable to ambulate well secondary to pain Pain in right leg decreased Tenderness in right knee and left knee improved No fever  REVIEW OF SYSTEMS:    ROS  CONSTITUTIONAL: No documented fever. No fatigue, weakness. No weight gain, no weight loss.  EYES: No blurry or double vision.  ENT: No tinnitus. No postnasal drip. No redness of the oropharynx.  RESPIRATORY: No cough, no wheeze, no hemoptysis. No dyspnea.  CARDIOVASCULAR: No chest pain. No orthopnea. No palpitations. No syncope.  GASTROINTESTINAL: No nausea, no vomiting or diarrhea. No abdominal pain. No melena or hematochezia.  GENITOURINARY: No dysuria or hematuria.  ENDOCRINE: No polyuria or nocturia. No heat or cold intolerance.  HEMATOLOGY: No anemia. No bruising. No bleeding.  INTEGUMENTARY: No rashes.  Scratch noted in the right leg lower extremity MUSCULOSKELETAL: No arthritis. No swelling. No gout.  Pain in the knees and swelling Pain in right ankle NEUROLOGIC: No numbness, tingling, or ataxia. No seizure-type activity.  PSYCHIATRIC: No anxiety. No insomnia. No ADD.   DRUG ALLERGIES:   Allergies  Allergen Reactions  . Indomethacin Hives  . Ace Inhibitors Swelling    Other reaction(s): SWELLING Facial swelling. Facial swelling.  . Augmentin [Amoxicillin-Pot Clavulanate] Other (See Comments)    Other reaction(s): Unknown  . Penicillins Nausea And Vomiting    Has patient had a PCN reaction causing immediate rash, facial/tongue/throat swelling, SOB or lightheadedness with hypotension: Yes Has patient had a PCN reaction causing severe rash involving mucus membranes or skin necrosis: No Has patient had a  PCN reaction that required hospitalization: No Has patient had a PCN reaction occurring within the last 10 years: No If all of the above answers are "NO", then may proceed with Cephalosporin use.  . Sulfur Other (See Comments)    VITALS:  Blood pressure (!) 154/80, pulse 85, temperature 97.8 F (36.6 C), temperature source Oral, resp. rate 18, height 5\' 4"  (1.626 m), weight 69.4 kg, SpO2 98 %.  PHYSICAL EXAMINATION:   Physical Exam  GENERAL:  85 y.o.-year-old patient lying in the bed with no acute distress.  EYES: Pupils equal, round, reactive to light and accommodation. No scleral icterus. Extraocular muscles intact.  HEENT: Head atraumatic, normocephalic. Oropharynx and nasopharynx clear.  NECK:  Supple, no jugular venous distention. No thyroid enlargement, no tenderness.  LUNGS: Normal breath sounds bilaterally, no wheezing, rales, rhonchi. No use of accessory muscles of respiration.  CARDIOVASCULAR: S1, S2 normal. No murmurs, rubs, or gallops.  ABDOMEN: Soft, nontender, nondistended. Bowel sounds present. No organomegaly or mass.  EXTREMITIES: No cyanosis, clubbing or edema b/l.   Left knee tender and warm Swelling and pain in the right knee too Right ankle tenderness NEUROLOGIC: Cranial nerves II through XII are intact. No focal Motor or sensory deficits b/l.   PSYCHIATRIC: The patient is alert and oriented x 3.  SKIN: 2 bite marks noted on the right lower leg     LABORATORY PANEL:   CBC Recent Labs  Lab 02/27/19 0518  WBC 13.5*  HGB 10.3*  HCT 31.3*  PLT 224   ------------------------------------------------------------------------------------------------------------------ Chemistries  Recent Labs  Lab 02/23/19 1331  02/26/19 0310 02/27/19 0518  NA 137   < >  128* 134*  K 4.4   < > 4.2 4.0  CL 104   < > 101 106  CO2 22   < > 20* 21*  GLUCOSE 107*   < > 132* 85  BUN 25*   < > 17 20  CREATININE 1.04*   < > 0.91 0.96  CALCIUM 9.9   < > 8.1* 8.8*  MG  --     < > 2.0  --   AST 27  --   --   --   ALT 13  --   --   --   ALKPHOS 93  --   --   --   BILITOT 0.8  --   --   --    < > = values in this interval not displayed.   ------------------------------------------------------------------------------------------------------------------  Cardiac Enzymes No results for input(s): TROPONINI in the last 168 hours. ------------------------------------------------------------------------------------------------------------------  RADIOLOGY:  Dg Knee 1-2 Views Left  Result Date: 02/26/2019 CLINICAL DATA:  Pain when moving EXAM: LEFT KNEE - 1-2 VIEW COMPARISON:  9/17/7 FINDINGS: Joint space are fairly well maintained. There is a calcifications within the medial and lateral joint spaces consistent chondrocalcinosis. Findings are mildly progressed from comparison exam 2007. Mild spurring of the patella superiorly. Small joint effusion.  Vascular calcifications. IMPRESSION: 1. Very small suprapatellar joint effusion. 2. Chondrocalcinosis in the medial and lateral compartment mildly progressed from 2007. Electronically Signed   By: Suzy Bouchard M.D.   On: 02/26/2019 10:30   Dg Knee 1-2 Views Right  Result Date: 02/26/2019 CLINICAL DATA:  Right knee pain with movement.  No known injury. EXAM: RIGHT KNEE - 1-2 VIEW COMPARISON:  Plain films right knee 06/12/2016. FINDINGS: Mild to moderate degenerative change about the knee is most notable in the patellofemoral compartment. There is a small joint effusion. Chondrocalcinosis is seen. Atherosclerosis noted. No acute bony abnormality. IMPRESSION: No acute finding. Mild to moderate osteoarthritis most notable in the patellofemoral compartment. Chondrocalcinosis. Electronically Signed   By: Inge Rise M.D.   On: 02/26/2019 10:33   Dg Ankle 2 Views Right  Result Date: 02/26/2019 CLINICAL DATA:  Pain with moving. EXAM: RIGHT ANKLE - 2 VIEW COMPARISON:  No recent prior. FINDINGS: Soft tissue swelling over the lateral  malleolus noted. A tiny bony density is noted adjacent to the lower lateral aspect of the talus. This could represent a tiny fracture fragment, possibly old. No prominent malleolar fractures noted. Mild calcaneal spurring noted. Moderate diffuse degenerative change. Postsurgical changes first and fifth metatarsals. IMPRESSION: 1. Soft tissue swelling noted over the lateral malleolus. A tiny bony density noted adjacent to the lower lateral aspect of the talus. This could represent a tiny fracture fragment, possibly old. No prominent malleolar fractures noted. 2. Moderate calcaneal spurring. Moderate diffuse degenerative change. Postsurgical changes first and fifth metatarsals. Electronically Signed   By: Marcello Moores  Register   On: 02/26/2019 10:32     ASSESSMENT AND PLAN:   85 year old elderly female patient with history of CKD stage III, hyperlipidemia, hypertension, hypothyroidism currently under hospitalist service for cat bite  -Right lower extremity cellulitis secondary to cat scratch and bite Improving slowly Continue IV Rocephin antibiotic Continue azithromycin orally Monitor WBC count  -Leukocytosis secondary to cellulitis from cat scratch fever Improving slowly Probably elevated from steroids  -Acute flareup of gout resolving Start patient on oral allopurinol, colchicine continue oral steroids Dementia  -Pain in the right and left knee X-rays of the knees revealed osteoarthirits  -Right ankle pain Questionable tiny  fracture on imaging studies Orthopedic evaluation Pain mgmt  -DVT prophylaxis subcu Lovenox daily  -CKD stage III Monitor renal function Avoid nephrotoxic agents  -Hyponatremia Oral salt tablets  -Hypocalcemia Replace calcium  -Ambulatory dysfunction PT evaluation done Patient does not want snf Once pain better controlled and after ortho evaluation disposition will be home with home health services  All the records are reviewed and case discussed with  Care Management/Social Worker. Management plans discussed with the patient, family and they are in agreement.  CODE STATUS: Full code  DVT Prophylaxis: SCDs  TOTAL TIME TAKING CARE OF THIS PATIENT: 35 minutes.   POSSIBLE D/C IN 1 to 2 DAYS, DEPENDING ON CLINICAL CONDITION.  Saundra Shelling M.D on 02/27/2019 at 11:58 AM  Between 7am to 6pm - Pager - [phone]  After 6pm go to [email or web address] - password [email or web address] [place] [facility]  Office  [phone]  CC: Primary care physician; Arnetha Courser, MD  Note: This dictation was prepared with Dragon dictation along with smaller phrase technology. Any transcriptional errors that result from this process are unintentional.

## 2019-02-27 NOTE — Plan of Care (Signed)
Patient still having some pain in the right ankle/leg.  Cellulitis has improved but patient still having trouble putting any weight on it.  She sat up in the chair for a couple hours this morning but it took 2 assist to get her back to the bed.  No significant changes.

## 2019-02-27 NOTE — Consult Note (Signed)
ORTHOPAEDIC CONSULTATION  REQUESTING PHYSICIAN: Saundra Shelling, MD  Chief Complaint: right ankle pain  HPI: Caitlin Jenkins is a 85 y.o. female who complains of right ankle pain after cat bite. Please see H&P and ED notes for details. Denies any numbness, tingling or constitutional symptoms.  Past Medical History:  Diagnosis Date  . Biliary cirrhosis ([place])   . Cancer of skin of leg   . CKD (chronic kidney disease) stage 3, GFR 30-59 ml/min (HCC)   . Gout   . Hiatal hernia Oct 2015   8.5cm  . History of basal cell carcinoma (BCC) 09/12/2018   Oct 2016; Moh's surgery; right antitragus  . History of diverticulitis 2000  . Hyperlipidemia   . Hyperparathyroidism, secondary renal ([place]) 07/11/2018   Managed by nephrologist  . Hypertension   . Hypothyroidism   . Skin cancer of nose    Past Surgical History:  Procedure Laterality Date  . ABDOMINAL HYSTERECTOMY  1995  . CATARACT EXTRACTION    . FOOT SURGERY  2000  . HEMORRHOID SURGERY    . THYROID SURGERY  1999   para thyroid   Social History   Socioeconomic History  . Marital status: Widowed    Spouse name: Joe  . Number of children: 4  . Years of education: some college  . Highest education level: 12th grade  Occupational History  . Occupation: Retired  Scientific laboratory technician  . Financial resource strain: Not hard at all  . Food insecurity:    Worry: Never true    Inability: Never true  . Transportation needs:    Medical: No    Non-medical: No  Tobacco Use  . Smoking status: Never Smoker  . Smokeless tobacco: Never Used  . Tobacco comment: smoking cessation materials not required  Substance and Sexual Activity  . Alcohol use: No  . Drug use: No  . Sexual activity: Not Currently  Lifestyle  . Physical activity:    Days per week: 0 days    Minutes per session: 0 min  . Stress: Not at all  Relationships  . Social connections:    Talks on phone: Patient refused    Gets together: Patient refused    Attends  religious service: Patient refused    Active member of club or organization: Patient refused    Attends meetings of clubs or organizations: Patient refused    Relationship status: Widowed  Other Topics Concern  . Not on file  Social History Narrative  . Not on file   Family History  Problem Relation Age of Onset  . Emphysema Father   . Asthma Father   . Hypertension Daughter   . Heart disease Daughter        3 stents  . Diabetes Daughter   . Hypertension Son   . Cancer Son        prostate  . Diabetes Son   . Heart disease Daughter        heart attack, 2 stents  . Multiple sclerosis Daughter   . Diabetes Daughter   . Fibromyalgia Daughter   . Diabetes Daughter    Allergies  Allergen Reactions  . Indomethacin Hives  . Ace Inhibitors Swelling    Other reaction(s): SWELLING Facial swelling. Facial swelling.  . Augmentin [Amoxicillin-Pot Clavulanate] Other (See Comments)    Other reaction(s): Unknown  . Penicillins Nausea And Vomiting    Has patient had a PCN reaction causing immediate rash, facial/tongue/throat swelling, SOB or lightheadedness with hypotension: Yes Has patient had  a PCN reaction causing severe rash involving mucus membranes or skin necrosis: No Has patient had a PCN reaction that required hospitalization: No Has patient had a PCN reaction occurring within the last 10 years: No If all of the above answers are "NO", then may proceed with Cephalosporin use.  [name] Sulfur Other (See Comments)   Prior to Admission medications   Medication Sig Start Date End Date Taking? Authorizing Provider  amLODipine (NORVASC) 2.5 MG tablet Take 1 tablet (2.5 mg total) by mouth daily. 07/31/18  Yes Lada, Satira Anis, MD  atorvastatin (LIPITOR) 10 MG tablet Take 1 tablet (10 mg total) by mouth at bedtime. (do not take for 3 days after taking colchicine) 12/13/18  Yes Lada, Satira Anis, MD  BIOTIN PO Take 250 mg by mouth 2 (two) times daily.    Yes [provider]   Calcium-Magnesium-Vitamin D (CALCIUM 1200+D3 PO) Take 1 capsule by mouth daily.   Yes [provider]  COLACE 100 MG capsule Take 100 mg by mouth daily.  01/26/15  Yes [provider]  levothyroxine (SYNTHROID, LEVOTHROID) 100 MCG tablet Take 1 tablet (100 mcg total) by mouth daily. 09/17/18  Yes Lada, Satira Anis, MD  losartan (COZAAR) 100 MG tablet Take 1 tablet by mouth daily (Avoid salt substitutes, no fruit smoothies.) 10/09/18  Yes Lada, Satira Anis, MD  niacinamide 500 MG tablet Take 1 tablet (500 mg total) by mouth 2 (two) times daily with a meal. 11/13/17  Yes Lada, Satira Anis, MD  Turmeric 450 MG CAPS Take 450 mg by mouth 2 (two) times daily.   Yes [provider]  ursodiol (ACTIGALL) 300 MG capsule Take 300 mg by mouth daily.  12/16/14  Yes [provider]  vitamin B-12 (CYANOCOBALAMIN) 250 MCG tablet Take 250 mcg by mouth daily.   Yes [provider]  colchicine 0.6 MG tablet Take two pills at onset of gout flare, followed by one more one hour later, max of three pills per flare. No atorvastatin x 3 days Patient not taking: Reported on 09/12/2018 06/13/18   Arnetha Courser, MD   Dg Knee 1-2 Views Left  Result Date: 02/26/2019 CLINICAL DATA:  Pain when moving EXAM: LEFT KNEE - 1-2 VIEW COMPARISON:  9/17/7 FINDINGS: Joint space are fairly well maintained. There is a calcifications within the medial and lateral joint spaces consistent chondrocalcinosis. Findings are mildly progressed from comparison exam 2007. Mild spurring of the patella superiorly. Small joint effusion.  Vascular calcifications. IMPRESSION: 1. Very small suprapatellar joint effusion. 2. Chondrocalcinosis in the medial and lateral compartment mildly progressed from 2007. Electronically Signed   By: Suzy Bouchard M.D.   On: 02/26/2019 10:30   Dg Knee 1-2 Views Right  Result Date: 02/26/2019 CLINICAL DATA:  Right knee pain with movement.  No known injury. EXAM: RIGHT KNEE - 1-2 VIEW  COMPARISON:  Plain films right knee 06/12/2016. FINDINGS: Mild to moderate degenerative change about the knee is most notable in the patellofemoral compartment. There is a small joint effusion. Chondrocalcinosis is seen. Atherosclerosis noted. No acute bony abnormality. IMPRESSION: No acute finding. Mild to moderate osteoarthritis most notable in the patellofemoral compartment. Chondrocalcinosis. Electronically Signed   By: Inge Rise M.D.   On: 02/26/2019 10:33   Dg Ankle 2 Views Right  Result Date: 02/26/2019 CLINICAL DATA:  Pain with moving. EXAM: RIGHT ANKLE - 2 VIEW COMPARISON:  No recent prior. FINDINGS: Soft tissue swelling over the lateral malleolus noted. A tiny bony density is noted adjacent  to the lower lateral aspect of the talus. This could represent a tiny fracture fragment, possibly old. No prominent malleolar fractures noted. Mild calcaneal spurring noted. Moderate diffuse degenerative change. Postsurgical changes first and fifth metatarsals. IMPRESSION: 1. Soft tissue swelling noted over the lateral malleolus. A tiny bony density noted adjacent to the lower lateral aspect of the talus. This could represent a tiny fracture fragment, possibly old. No prominent malleolar fractures noted. 2. Moderate calcaneal spurring. Moderate diffuse degenerative change. Postsurgical changes first and fifth metatarsals. Electronically Signed   By: Marcello Moores  Register   On: 02/26/2019 10:32    Positive ROS: All other systems have been reviewed and were otherwise negative with the exception of those mentioned in the HPI and as above.  Physical Exam: General: Alert, no acute distress Cardiovascular: No pedal edema Respiratory: No cyanosis, no use of accessory musculature GI: No organomegaly, abdomen is soft and non-tender Skin: No lesions in the area of chief complaint Neurologic: Sensation intact distally Psychiatric: Patient is competent for consent with normal mood and affect Lymphatic: No axillary  or cervical lymphadenopathy  MUSCULOSKELETAL: +erythema, swelling and warmth. Compartments soft. Good cap refill. Motor and sensory intact distally.  Assessment: cellulitis  Plan: X-rays reviewed and the findings are likely chronic in nature. I do not see evidence of acute fracture. No need for ankle stabilization at this point and she may weight bear as tolerated. Please call with questions. She may follow-up with me in 4 to 6 weeks for her knee pain once the infection has cleared.    Lovell Sheehan, MD    02/27/2019 3:45 PM

## 2019-02-28 LAB — CBC
HCT: 33.8 % — ABNORMAL LOW (ref 36.0–46.0)
Hemoglobin: 11.2 g/dL — ABNORMAL LOW (ref 12.0–15.0)
MCH: 29.6 pg (ref 26.0–34.0)
MCHC: 33.1 g/dL (ref 30.0–36.0)
MCV: 89.2 fL (ref 80.0–100.0)
Platelets: 275 10*3/uL (ref 150–400)
RBC: 3.79 MIL/uL — ABNORMAL LOW (ref 3.87–5.11)
RDW: 13.3 % (ref 11.5–15.5)
WBC: 11.9 10*3/uL — ABNORMAL HIGH (ref 4.0–10.5)
nRBC: 0 % (ref 0.0–0.2)

## 2019-02-28 LAB — CULTURE, BLOOD (ROUTINE X 2)
Culture: NO GROWTH
Culture: NO GROWTH
Special Requests: ADEQUATE
Special Requests: ADEQUATE

## 2019-02-28 LAB — MAGNESIUM: Magnesium: 1.5 mg/dL — ABNORMAL LOW (ref 1.7–2.4)

## 2019-02-28 MED ORDER — CEPHALEXIN 500 MG PO CAPS: 500.0000 mg | ORAL_CAPSULE | Freq: Two times a day (BID) | ORAL | Status: DC

## 2019-02-28 MED ORDER — COLCHICINE 0.6 MG PO TABS: 0.6000 mg | ORAL_TABLET | Freq: Two times a day (BID) | ORAL | 0 refills | Status: DC

## 2019-02-28 MED ORDER — MAGNESIUM SULFATE 2 GM/50ML IV SOLN
2.0000 g | Freq: Once | INTRAVENOUS | Status: AC
  Administered 2019-02-28: 08:00:00 2 g via INTRAVENOUS
  Filled 2019-02-28: qty 50

## 2019-02-28 MED ORDER — CEPHALEXIN 500 MG PO CAPS: 500.0000 mg | ORAL_CAPSULE | Freq: Two times a day (BID) | ORAL | 0 refills | Status: DC

## 2019-02-28 NOTE — TOC Transition Note (Signed)
Transition of Care [facility]) - CM/SW Discharge Note   Patient Details  Name: Caitlin Jenkins MRN: [id number] Date of Birth: 03/28/1932  [facility]) CM/SW Contact:  Caitlin Sessions, RN Phone Number: 02/28/2019, 2:29 PM   Clinical Narrative:    Patient to discharge to daughters home today.  [facility] was unable to accept referral.  Daughter notified, states then do not have a preference of agency. Referral made to Caitlin Jenkins with [facility].  Daughter notified that the referral was accepted    Final next level of care: [place] Barriers to Discharge: Continued Medical Work up   Patient Goals and CMS Choice Patient states their goals for this hospitalization and ongoing recovery are:: "i dont want to go to rehab" CMS Medicare.gov Compare Post Acute Care list provided to:: Patient Represenative (must comment)(daughter Caitlin Jenkins)    Discharge Placement                       Discharge Plan and Services   Discharge Planning Services: CM Consult Post Acute Care Choice: Home Health                    HH Arranged: RN, PT [facility] Agency: [facility] Date [place]: 02/28/19 Time [place]: [place] Representative spoke with at [place]: Caitlin Jenkins   Social Determinants of Health ([place]) Interventions     Readmission Risk Interventions No flowsheet data found.

## 2019-02-28 NOTE — Progress Notes (Signed)
MD ordered patient to be discharged home.  Discharge instructions were reviewed with the patient and she voiced understanding.  Patient instructed on making her follow-up appointment.    Prescriptions given to the patient.  IV was removed with catheter intact.  All patients questions were answered.  Patient left via wheelchair escorted by nursing.

## 2019-02-28 NOTE — Discharge Instructions (Signed)
HHPT °

## 2019-02-28 NOTE — Discharge Summary (Signed)
[facility] [place] NAME: Caitlin Jenkins    MR#:  [id number]  DATE OF BIRTH:  12/18/1931  DATE OF ADMISSION:  02/23/2019   ADMITTING PHYSICIAN: Otila Back, MD  DATE OF DISCHARGE: 02/28/2019  PRIMARY CARE PHYSICIAN: Arnetha Courser, MD   ADMISSION DIAGNOSIS:  Cat scratch fever [A28.1] Cellulitis of right lower extremity [L03.115] Sepsis, due to unspecified organism, unspecified whether acute organ dysfunction present ([place]) [A41.9] DISCHARGE DIAGNOSIS:  Active Problems:   Cellulitis  SECONDARY DIAGNOSIS:   Past Medical History:  Diagnosis Date  . Biliary cirrhosis ([place])   . Cancer of skin of leg   . CKD (chronic kidney disease) stage 3, GFR 30-59 ml/min (HCC)   . Gout   . Hiatal hernia Oct 2015   8.5cm  . History of basal cell carcinoma (BCC) 09/12/2018   Oct 2016; Moh's surgery; right antitragus  . History of diverticulitis 2000  . Hyperlipidemia   . Hyperparathyroidism, secondary renal ([place]) 07/11/2018   Managed by nephrologist  . Hypertension   . Hypothyroidism   . Skin cancer of nose    HOSPITAL COURSE:  85 year old elderly female patient with history of CKD stage III, hyperlipidemia, hypertension, hypothyroidism currently under hospitalist service for cat bite  -Right lower extremity cellulitis secondary to cat scratch and bite Improving slowly The patient has been treated with IV Rocephin antibiotic Changed to Keflex for 5 more days.  -Leukocytosis secondary to cellulitis from cat scratch fever Improved.  -Acute flareup of gout Continue oral allopurinol, colchicine continue oral steroids Dementia  -Pain in the right and left knee X-rays of the knees revealed osteoarthirits  -Right ankle pain No fracture, follow-up Dr. Harlow Mares as outpatient.  -DVT prophylaxis subcu Lovenox daily  -CKD stage III Monitor renal function Avoid nephrotoxic agents  -Hyponatremia Improved.  -Hypocalcemia Improved  with calcium.  Hypomagnesemia.  IV magnesium given and follow-up level with PCP.  -Ambulatory dysfunction PT evaluation suggest skilled nursing facility placement. Patient does not want snf. Hypertension.  Continue home hypertension medication. DISCHARGE CONDITIONS:  Stable, discharged to home with home health and PT today. CONSULTS OBTAINED:   DRUG ALLERGIES:   Allergies  Allergen Reactions  . Indomethacin Hives  . Ace Inhibitors Swelling    Other reaction(s): SWELLING Facial swelling. Facial swelling.  . Augmentin [Amoxicillin-Pot Clavulanate] Other (See Comments)    Other reaction(s): Unknown  . Penicillins Nausea And Vomiting    Has patient had a PCN reaction causing immediate rash, facial/tongue/throat swelling, SOB or lightheadedness with hypotension: Yes Has patient had a PCN reaction causing severe rash involving mucus membranes or skin necrosis: No Has patient had a PCN reaction that required hospitalization: No Has patient had a PCN reaction occurring within the last 10 years: No If all of the above answers are "NO", then may proceed with Cephalosporin use.  . Sulfur Other (See Comments)   DISCHARGE MEDICATIONS:   Allergies as of 02/28/2019      Reactions   Indomethacin Hives   Ace Inhibitors Swelling   Other reaction(s): SWELLING Facial swelling. Facial swelling.   Augmentin [amoxicillin-pot Clavulanate] Other (See Comments)   Other reaction(s): Unknown   Penicillins Nausea And Vomiting   Has patient had a PCN reaction causing immediate rash, facial/tongue/throat swelling, SOB or lightheadedness with hypotension: Yes Has patient had a PCN reaction causing severe rash involving mucus membranes or skin necrosis: No Has patient had a PCN reaction that required hospitalization: No Has patient had a PCN reaction  occurring within the last 10 years: No If all of the above answers are "NO", then may proceed with Cephalosporin use.   Sulfur Other (See Comments)       Medication List    TAKE these medications   amLODipine 2.5 MG tablet Commonly known as:  NORVASC Take 1 tablet (2.5 mg total) by mouth daily. Notes to patient:  03/01/19   atorvastatin 10 MG tablet Commonly known as:  LIPITOR Take 1 tablet (10 mg total) by mouth at bedtime. (do not take for 3 days after taking colchicine) Notes to patient:  Today at bedtime   BIOTIN PO Take 250 mg by mouth 2 (two) times daily. Notes to patient:  Resume home schedule   CALCIUM 1200+D3 PO Take 1 capsule by mouth daily. Notes to patient:  Resume home schedule   cephALEXin 500 MG capsule Commonly known as:  KEFLEX Take 1 capsule (500 mg total) by mouth every 12 (twelve) hours for 5 days. Notes to patient:  Today at bedtime   Colace 100 MG capsule Generic drug:  docusate sodium Take 100 mg by mouth daily. Notes to patient:  03/01/19   colchicine 0.6 MG tablet Take 1 tablet (0.6 mg total) by mouth 2 (two) times daily for 10 days. What changed:    how much to take  how to take this  when to take this  additional instructions Notes to patient:  Today bedtime   levothyroxine 100 MCG tablet Commonly known as:  SYNTHROID Take 1 tablet (100 mcg total) by mouth daily. Notes to patient:  03/01/19   losartan 100 MG tablet Commonly known as:  COZAAR Take 1 tablet by mouth daily (Avoid salt substitutes, no fruit smoothies.) Notes to patient:  03/01/19   niacinamide 500 MG tablet Take 1 tablet (500 mg total) by mouth 2 (two) times daily with a meal. Notes to patient:  Resume home schedule   Turmeric 450 MG Caps Take 450 mg by mouth 2 (two) times daily. Notes to patient:  Resume home schedule   ursodiol 300 MG capsule Commonly known as:  ACTIGALL Take 300 mg by mouth daily. Notes to patient:  03/01/19   vitamin B-12 250 MCG tablet Commonly known as:  CYANOCOBALAMIN Take 250 mcg by mouth daily. Notes to patient:  03/01/19        DISCHARGE INSTRUCTIONS:  See AVS. If you experience  worsening of your admission symptoms, develop shortness of breath, life threatening emergency, suicidal or homicidal thoughts you must seek medical attention immediately by calling 911 or calling your MD immediately  if symptoms less severe.  You Must read complete instructions/literature along with all the possible adverse reactions/side effects for all the Medicines you take and that have been prescribed to you. Take any new Medicines after you have completely understood and accpet all the possible adverse reactions/side effects.   Please note  You were cared for by a hospitalist during your hospital stay. If you have any questions about your discharge medications or the care you received while you were in the hospital after you are discharged, you can call the unit and asked to speak with the hospitalist on call if the hospitalist that took care of you is not available. Once you are discharged, your primary care physician will handle any further medical issues. Please note that NO REFILLS for any discharge medications will be authorized once you are discharged, as it is imperative that you return to your primary care physician (or establish a relationship with  a primary care physician if you do not have one) for your aftercare needs so that they can reassess your need for medications and monitor your lab values.    On the day of Discharge:  VITAL SIGNS:  Blood pressure (!) 166/96, pulse 80, temperature 98.2 F (36.8 C), temperature source Oral, resp. rate 20, height 5\' 4"  (1.626 m), weight 69.4 kg, SpO2 98 %. PHYSICAL EXAMINATION:  GENERAL:  85 y.o.-year-old patient lying in the bed with no acute distress.  EYES: Pupils equal, round, reactive to light and accommodation. No scleral icterus. Extraocular muscles intact.  HEENT: Head atraumatic, normocephalic. Oropharynx and nasopharynx clear.  NECK:  Supple, no jugular venous distention. No thyroid enlargement, no tenderness.  LUNGS: Normal breath  sounds bilaterally, no wheezing, rales,rhonchi or crepitation. No use of accessory muscles of respiration.  CARDIOVASCULAR: S1, S2 normal. No murmurs, rubs, or gallops.  ABDOMEN: Soft, non-tender, non-distended. Bowel sounds present. No organomegaly or mass.  EXTREMITIES: No pedal edema, cyanosis, or clubbing.  Right ankle mild tenderness and swelling. NEUROLOGIC: Cranial nerves II through XII are intact. Muscle strength 5/5 in all extremities. Sensation intact. Gait not checked.  PSYCHIATRIC: The patient is alert and oriented x 3.  SKIN: No obvious rash, lesion, or ulcer.  DATA REVIEW:   CBC Recent Labs  Lab 02/28/19 0331  WBC 11.9*  HGB 11.2*  HCT 33.8*  PLT 275    Chemistries  Recent Labs  Lab 02/23/19 1331  02/27/19 0518 02/28/19 0331  NA 137   < > 134*  --   K 4.4   < > 4.0  --   CL 104   < > 106  --   CO2 22   < > 21*  --   GLUCOSE 107*   < > 85  --   BUN 25*   < > 20  --   CREATININE 1.04*   < > 0.96  --   CALCIUM 9.9   < > 8.8*  --   MG  --    < >  --  1.5*  AST 27  --   --   --   ALT 13  --   --   --   ALKPHOS 93  --   --   --   BILITOT 0.8  --   --   --    < > = values in this interval not displayed.     Microbiology Results  Results for orders placed or performed during the hospital encounter of 02/23/19  Blood Culture (routine x 2)     Status: None   Collection Time: 02/23/19  5:09 PM  Result Value Ref Range Status   Specimen Description BLOOD BLOOD RIGHT FOREARM  Final   Special Requests   Final    BOTTLES DRAWN AEROBIC AND ANAEROBIC Blood Culture adequate volume   Culture   Final    NO GROWTH 5 DAYS Performed at [facility], [address] [place], [place] [address]    Report Status 02/28/2019 FINAL  Final  Urine culture     Status: None   Collection Time: 02/23/19  5:10 PM  Result Value Ref Range Status   Specimen Description   Final    URINE, RANDOM Performed at [facility], [address], [place], [place] [address]     Special Requests   Final    NONE Performed at [facility], [address] [place], [place] [address]    Culture  Final    NO GROWTH Performed at [facility], [place] [address] [place], [place] [address]    Report Status 02/25/2019 FINAL  Final  Blood Culture (routine x 2)     Status: None   Collection Time: 02/23/19  5:14 PM  Result Value Ref Range Status   Specimen Description BLOOD BLOOD RIGHT FOREARM  Final   Special Requests   Final    BOTTLES DRAWN AEROBIC AND ANAEROBIC Blood Culture adequate volume   Culture   Final    NO GROWTH 5 DAYS Performed at [facility], [address] [place], [place] [address]    Report Status 02/28/2019 FINAL  Final  SARS Coronavirus 2 (CEPHEID - Performed in [place] hospital lab), Hosp Order     Status: None   Collection Time: 02/23/19  5:29 PM  Result Value Ref Range Status   SARS Coronavirus 2 NEGATIVE NEGATIVE Final    Comment: (NOTE) If result is NEGATIVE SARS-CoV-2 target nucleic acids are NOT DETECTED. The SARS-CoV-2 RNA is generally detectable in upper and lower  respiratory specimens during the acute phase of infection. The lowest  concentration of SARS-CoV-2 viral copies this assay can detect is 250  copies / mL. A negative result does not preclude SARS-CoV-2 infection  and should not be used as the sole basis for treatment or other  patient management decisions.  A negative result may occur with  improper specimen collection / handling, submission of specimen other  than nasopharyngeal swab, presence of viral mutation(s) within the  areas targeted by this assay, and inadequate number of viral copies  (<250 copies / mL). A negative result must be combined with clinical  observations, patient history, and epidemiological information. If result is POSITIVE SARS-CoV-2 target nucleic acids are DETECTED. The SARS-CoV-2 RNA is generally detectable in upper and lower  respiratory specimens dur ing the  acute phase of infection.  Positive  results are indicative of active infection with SARS-CoV-2.  Clinical  correlation with patient history and other diagnostic information is  necessary to determine patient infection status.  Positive results do  not rule out bacterial infection or co-infection with other viruses. If result is PRESUMPTIVE POSTIVE SARS-CoV-2 nucleic acids MAY BE PRESENT.   A presumptive positive result was obtained on the submitted specimen  and confirmed on repeat testing.  While 2019 novel coronavirus  (SARS-CoV-2) nucleic acids may be present in the submitted sample  additional confirmatory testing may be necessary for epidemiological  and / or clinical management purposes  to differentiate between  SARS-CoV-2 and other Sarbecovirus currently known to infect humans.  If clinically indicated additional testing with an alternate test  methodology [phone]) is advised. The SARS-CoV-2 RNA is generally  detectable in upper and lower respiratory sp ecimens during the acute  phase of infection. The expected result is Negative. Fact Sheet for Patients:  StrictlyIdeas.no Fact Sheet for Healthcare Providers: BankingDealers.co.za This test is not yet approved or cleared by the [place] FDA and has been authorized for detection and/or diagnosis of SARS-CoV-2 by FDA under an Emergency Use Authorization (EUA).  This EUA will remain in effect (meaning this test can be used) for the duration of the COVID-19 declaration under Section 564(b)(1) of the Act, 21 U.S.C. section 360bbb-3(b)(1), unless the authorization is terminated or revoked sooner. Performed at [facility], [address], [place], [place] [address]     RADIOLOGY:  No results found.   Management plans discussed with the patient, family  and they are in agreement.  CODE STATUS: DNR   TOTAL TIME TAKING CARE OF THIS PATIENT: 33 minutes.    Demetrios Loll M.D on 02/28/2019 at 1:38 PM  Between 7am to 6pm - Pager - [phone]  After 6pm go to [email or web address] - Proofreader  [facility]  Office  [phone]  CC: Primary care physician; Arnetha Courser, MD   Note: This dictation was prepared with Dragon dictation along with smaller phrase technology. Any transcriptional errors that result from this process are unintentional.

## 2019-03-01 ENCOUNTER — Telehealth: Payer: Self-pay

## 2019-03-01 ENCOUNTER — Encounter: Payer: Self-pay | Admitting: Family Medicine

## 2019-03-01 NOTE — Telephone Encounter (Signed)
Transition Care Management Follow-up Telephone Call  Date of discharge and from where: 02/28/19 [facility]  How have you been since you were released from the hospital? Pt states she is doing okay, resting with right leg elevated and ankle still swollen. She denies pain unless putting pressure on foot and leg and reports still having redness and swelling on puncture site   Any questions or concerns? Yes - patient's daughter Levada Dy state she is supposed to start physical therapy with [facility] but has not been contacted by them yet. Advised to call our office on Monday if she does not hear anything by then.  Items Reviewed:  Did the pt receive and understand the discharge instructions provided? Yes   Medications obtained and verified? Yes   Any new allergies since your discharge? No   Dietary orders reviewed? Yes  Do you have support at home? Yes   Functional Questionnaire: (I = Independent and D = Dependent) ADLs: I with assistance  Bathing/Dressing - D  Meal Prep- D  Eating- I  Maintaining continence- I  Transferring/Ambulation- D  Managing Meds- I  Follow up appointments reviewed:   PCP Hospital f/u appt confirmed? Yes  Scheduled to see Suezanne Cheshire NP on 03/06/19 @ 11:20.  [facility] f/u appt confirmed? Yes  Pt planning to call ortho for follow up today, confirmed phone number to call  Are transportation arrangements needed? No   If their condition worsens, is the pt aware to call PCP or go to the Emergency Dept.? Yes  Was the patient provided with contact information for the PCP's office or ED? Yes  Was to pt encouraged to call back with questions or concerns? Yes

## 2019-03-05 ENCOUNTER — Ambulatory Visit (INDEPENDENT_AMBULATORY_CARE_PROVIDER_SITE_OTHER): Payer: PPO

## 2019-03-05 VITALS — BP 140/80 | HR 79 | Temp 98.2°F | Ht 64.0 in | Wt 151.2 lb

## 2019-03-05 DIAGNOSIS — Z0000 Encounter for general adult medical examination without abnormal findings: Secondary | ICD-10-CM | POA: Diagnosis not present

## 2019-03-05 NOTE — Progress Notes (Signed)
Name: Caitlin Jenkins   MRN: [id number]    DOB: [birth year]   Date:03/06/2019       Progress Note  Subjective  Chief Complaint  Chief Complaint  Patient presents with  . Hospitalization Follow-up    HPI  Patient was admitted from 5/30-02/28/2019 at [facility] for cellulitis caused by cat scratch that turned into sepsis. Patient was treated with fluids and IV antibiotics and discharged on a 5 day course of keflex.  TOC phone call was completed on 03/01/2019 by [facility] LPN.  Patient finished PO antibiotic course but right leg is still red, warm to touch and has swelling. States swelling has worsened today from yesterday, redness is improving. Right ankle is red and warm to touch. Patient is able to ambulate on it, she does not want physical therapy at this time. Denies fevers, chills. Does note some diarrhea when on keflex.   PHQ2/9: Depression screen [facility] 2/9 03/06/2019 03/05/2019 09/12/2018 06/13/2018 05/18/2018  Decreased Interest 0 0 0 0 0  Down, Depressed, Hopeless 0 1 0 0 0  PHQ - 2 Score 0 1 0 0 0  Altered sleeping 0 - 0 - -  Tired, decreased energy 0 - 0 - -  Change in appetite 0 - 0 - -  Feeling bad or failure about yourself  0 - 0 - -  Trouble concentrating 0 - 0 - -  Moving slowly or fidgety/restless 0 - 0 - -  Suicidal thoughts 0 - 0 - -  PHQ-9 Score 0 - 0 - -  Difficult doing work/chores Not difficult at all - Not difficult at all - -     PHQ reviewed. Negative  Patient Active Problem List   Diagnosis Date Noted  . Cellulitis 02/23/2019  . History of basal cell carcinoma (BCC) 09/12/2018  . Hyperparathyroidism, secondary renal ([place]) 07/11/2018  . Primary biliary cholangitis ([place]) 03/20/2017  . Overweight (BMI 25.0-29.9) 11/29/2015  . Carotid artery disease ([place]) 10/09/2015  . Medication monitoring encounter 10/09/2015  . Nail abnormalities 10/09/2015  . Arthralgia of multiple joints 10/09/2015  . Essential hypertension, benign 06/19/2015  . 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 Hyperlipidemia   . Hypothyroidism   . Gout   . CKD (chronic kidney disease) stage 3, GFR 30-59 ml/min (HCC)   . Hiatal hernia 06/26/2014    Past Medical History:  Diagnosis Date  . Biliary cirrhosis ([place])   . Cancer of skin of leg   . Cellulitis   . CKD (chronic kidney disease) stage 3, GFR 30-59 ml/min (HCC)   . Gout   . Hiatal hernia Oct 2015   8.5cm  . History of basal cell carcinoma (BCC) 09/12/2018   Oct 2016; Moh's surgery; right antitragus  . History of diverticulitis 2000  . Hyperlipidemia   . Hyperparathyroidism, secondary renal ([place]) 07/11/2018   Managed by nephrologist  . Hypertension   . Hypothyroidism   . Skin cancer of nose     Past Surgical History:  Procedure Laterality Date  . ABDOMINAL HYSTERECTOMY  1995  . CATARACT EXTRACTION    . FOOT SURGERY  2000  . HEMORRHOID SURGERY    . THYROID SURGERY  1999   para thyroid    Social History   Tobacco Use  . Smoking status: Never Smoker  . Smokeless tobacco: Never Used  . Tobacco comment: smoking cessation materials not required  Substance Use Topics  . Alcohol use: No     Current Outpatient Medications:  .  amLODipine (NORVASC) 2.5 MG tablet, Take 1 tablet (2.5 mg total) by mouth daily., Disp: 90 tablet, Rfl: 3 .  atorvastatin (LIPITOR) 10 MG tablet, Take 1 tablet (10 mg total) by mouth at bedtime. (do not take for 3 days after taking colchicine), Disp: 90 tablet, Rfl: 1 .  BIOTIN PO, Take 250 mg by mouth 2 (two) times daily. , Disp: , Rfl:  .  Calcium-Magnesium-Vitamin D (CALCIUM 1200+D3 PO), Take 1 capsule by mouth daily., Disp: , Rfl:  .  COLACE 100 MG capsule, Take 100 mg by mouth daily. , Disp: , Rfl:  .  levothyroxine (SYNTHROID, LEVOTHROID) 100 MCG tablet, Take 1 tablet (100 mcg total) by mouth daily., Disp: 90 tablet, Rfl: 3 .  losartan (COZAAR) 100 MG tablet, Take 1 tablet by mouth daily (Avoid salt substitutes, no fruit smoothies.), Disp: 90 tablet, Rfl:  1 .  niacinamide 500 MG tablet, Take 1 tablet (500 mg total) by mouth 2 (two) times daily with a meal., Disp: 180 tablet, Rfl: 1 .  Turmeric 450 MG CAPS, Take 450 mg by mouth 2 (two) times daily., Disp: , Rfl:  .  ursodiol (ACTIGALL) 300 MG capsule, Take 300 mg by mouth daily. , Disp: , Rfl:  .  vitamin B-12 (CYANOCOBALAMIN) 250 MCG tablet, Take 250 mcg by mouth daily., Disp: , Rfl:  .  colchicine 0.6 MG tablet, Take 1 tablet (0.6 mg total) by mouth 2 (two) times daily for 10 days. (Patient not taking: Reported on 03/06/2019), Disp: 20 tablet, Rfl: 0  Allergies  Allergen Reactions  . Indomethacin Hives  . Ace Inhibitors Swelling    Other reaction(s): SWELLING Facial swelling. Facial swelling.  . Augmentin [Amoxicillin-Pot Clavulanate] Other (See Comments)    Other reaction(s): Unknown  . Penicillins Nausea And Vomiting    Has patient had a PCN reaction causing immediate rash, facial/tongue/throat swelling, SOB or lightheadedness with hypotension: Yes Has patient had a PCN reaction causing severe rash involving mucus membranes or skin necrosis: No Has patient had a PCN reaction that required hospitalization: No Has patient had a PCN reaction occurring within the last 10 years: No If all of the above answers are "NO", then may proceed with Cephalosporin use.  . Sulfur Other (See Comments)    ROS   No other specific complaints in a complete review of systems (except as listed in HPI above).  Objective  Vitals:   03/06/19 1138  BP: 122/68  Pulse: 94  Resp: 14  Temp: 98 F (36.7 C)  TempSrc: Oral  SpO2: 95%  Weight: 155 lb 4.8 oz (70.4 kg)  Height: 5\' 4"  (1.626 m)     Body mass index is 26.66 kg/m.  Nursing Note and Vital Signs reviewed.  Physical Exam Vitals signs reviewed.  Constitutional:      Appearance: She is well-developed.  HENT:     Head: Normocephalic and atraumatic.  Neck:     Musculoskeletal: Normal range of motion and neck supple.  Cardiovascular:      Heart sounds: Normal heart sounds.  Pulmonary:     Effort: Pulmonary effort is normal.     Breath sounds: Normal breath sounds.  Abdominal:     General: Bowel sounds are normal.     Palpations: Abdomen is soft.     Tenderness: There is no abdominal tenderness.  Musculoskeletal: Normal range of motion.  Skin:    Capillary Refill: Capillary refill takes less than 2 seconds.     Findings: Erythema and wound present.  Neurological:     Mental Status: She is alert and oriented to person, place, and time.     GCS: GCS eye subscore is 4. GCS verbal subscore is 5. GCS motor subscore is 6.     Sensory: No sensory deficit.  Psychiatric:        Speech: Speech normal.        Behavior: Behavior normal.        Thought Content: Thought content normal.        Judgment: Judgment normal.       No results found for this or any previous visit (from the past 48 hour(s)).  Assessment & Plan  1. Hospital discharge follow-up Stable, improving   2. Cat scratch fever  3. Cellulitis, unspecified cellulitis site Not completely resolved- restart antibiotic, improve swelling to assist with healing if worsening get medical attention, follow up in one week.  - doxycycline (VIBRA-TABS) 100 MG tablet; Take 1 tablet (100 mg total) by mouth 2 (two) times daily.  Dispense: 20 tablet; Refill: 0  4. Sepsis without acute organ dysfunction, due to unspecified organism ([place]) resolved  5. Right ankle swelling Hold off on starting lasix pending lab results  - furosemide (LASIX) 20 MG tablet; Take 1 tablet (20 mg total) by mouth daily.  Dispense: 3 tablet; Refill: 0  6. Hypomagnesemia - Basic Metabolic Panel (BMET) - Magnesium  7. Hyponatremia - Basic Metabolic Panel (BMET) - Magnesium

## 2019-03-05 NOTE — Progress Notes (Signed)
Subjective:   Maleia YASMIN BRONAUGH is a 85 y.o. female who presents for Medicare Annual (Subsequent) preventive examination.   Virtual Visit via Telephone Note  I connected with Milagros Loll on 03/05/19 at  2:00 PM EDT by telephone and verified that I am speaking with the correct person using two identifiers.  Medicare Annual Wellness visit completed telephonically due to Covid-19 pandemic.   Location: Patient: home Provider: office    I discussed the limitations, risks, security and privacy concerns of performing an evaluation and management service by telephone and the availability of in person appointments. The patient expressed understanding and agreed to proceed.  Some vital signs may be absent or patient reported.  Clemetine Marker, LPN   Review of Systems:   Cardiac Risk Factors include: advanced age (>17men, >20 women);hypertension     Objective:     Vitals: BP 140/80   Pulse 79   Temp 98.2 F (36.8 C) (Oral)   Ht 5\' 4"  (1.626 m)   Wt 151 lb 3.2 oz (68.6 kg)   BMI 25.95 kg/m   Body mass index is 25.95 kg/m.  Advanced Directives 03/05/2019 02/23/2019 02/23/2019 05/11/2018 05/11/2018 03/02/2018 04/04/2017  Does Patient Have a Medical Advance Directive? Yes Yes Yes Yes Yes Yes Yes  Type of Paramedic of [place];Living will [place];Living will [place];Living will Living will;Healthcare Power of [place];Living will [place];Living will  Does patient want to make changes to medical advance directive? - No - Patient declined No - Patient declined No - Patient declined - - -  Copy of [place] in Chart? Yes - validated most recent copy scanned in chart (See row information) No - copy requested No - copy requested No - copy requested - No - copy requested -  Would patient like information on creating a medical advance directive? - - - - No -  Patient declined - -    Tobacco Social History   Tobacco Use  Smoking Status Never Smoker  Smokeless Tobacco Never Used  Tobacco Comment   smoking cessation materials not required     Counseling given: Not Answered Comment: smoking cessation materials not required   Clinical Intake:  Pre-visit preparation completed: Yes  Pain : No/denies pain     BMI - recorded: 25.95 Nutritional Status: BMI 25 -29 Overweight Nutritional Risks: None Diabetes: No  How often do you need to have someone help you when you read instructions, pamphlets, or other written materials from your doctor or pharmacy?: 1 - Never  Interpreter Needed?: No  Information entered by :: Clemetine Marker LPN  Past Medical History:  Diagnosis Date  . Biliary cirrhosis ([place])   . Cancer of skin of leg   . Cellulitis   . CKD (chronic kidney disease) stage 3, GFR 30-59 ml/min (HCC)   . Gout   . Hiatal hernia Oct 2015   8.5cm  . History of basal cell carcinoma ([facility]) 09/12/2018   Oct 2016; Moh's surgery; right antitragus  . History of diverticulitis 2000  . Hyperlipidemia   . Hyperparathyroidism, secondary renal ([place]) 07/11/2018   Managed by nephrologist  . Hypertension   . Hypothyroidism   . Skin cancer of nose    Past Surgical History:  Procedure Laterality Date  . ABDOMINAL HYSTERECTOMY  1995  . CATARACT EXTRACTION    . FOOT SURGERY  2000  . HEMORRHOID SURGERY    . THYROID  SURGERY  1999   para thyroid   Family History  Problem Relation Age of Onset  . Emphysema Father   . Asthma Father   . Hypertension Daughter   . Heart disease Daughter        3 stents  . Diabetes Daughter   . Hypertension Son   . Cancer Son        prostate  . Diabetes Son   . Heart disease Daughter        heart attack, 2 stents  . Multiple sclerosis Daughter   . Diabetes Daughter   . Fibromyalgia Daughter   . Diabetes Daughter    Social History   Socioeconomic History  . Marital status: Widowed    Spouse name:  Joe  . Number of children: 4  . Years of education: some college  . Highest education level: 12th grade  Occupational History  . Occupation: Retired  Scientific laboratory technician  . Financial resource strain: Not hard at all  . Food insecurity:    Worry: Never true    Inability: Never true  . Transportation needs:    Medical: No    Non-medical: No  Tobacco Use  . Smoking status: Never Smoker  . Smokeless tobacco: Never Used  . Tobacco comment: smoking cessation materials not required  Substance and Sexual Activity  . Alcohol use: No  . Drug use: No  . Sexual activity: Not Currently  Lifestyle  . Physical activity:    Days per week: 0 days    Minutes per session: 0 min  . Stress: Not at all  Relationships  . Social connections:    Talks on phone: More than three times a week    Gets together: Three times a week    Attends religious service: More than 4 times per year    Active member of club or organization: No    Attends meetings of clubs or organizations: Never    Relationship status: Widowed  Other Topics Concern  . Not on file  Social History Narrative  . Not on file    Outpatient Encounter Medications as of 03/05/2019  Medication Sig  . amLODipine (NORVASC) 2.5 MG tablet Take 1 tablet (2.5 mg total) by mouth daily.  [name] BIOTIN PO Take 250 mg by mouth 2 (two) times daily.   . Calcium-Magnesium-Vitamin D (CALCIUM 1200+D3 PO) Take 1 capsule by mouth daily.  [name] COLACE 100 MG capsule Take 100 mg by mouth daily.   . colchicine 0.6 MG tablet Take 1 tablet (0.6 mg total) by mouth 2 (two) times daily for 10 days.  [name] levothyroxine (SYNTHROID, LEVOTHROID) 100 MCG tablet Take 1 tablet (100 mcg total) by mouth daily.  [name] losartan (COZAAR) 100 MG tablet Take 1 tablet by mouth daily (Avoid salt substitutes, no fruit smoothies.)  . niacinamide 500 MG tablet Take 1 tablet (500 mg total) by mouth 2 (two) times daily with a meal.  . Turmeric 450 MG CAPS Take 450 mg by mouth 2 (two) times daily.  .  ursodiol (ACTIGALL) 300 MG capsule Take 300 mg by mouth daily.   . vitamin B-12 (CYANOCOBALAMIN) 250 MCG tablet Take 250 mcg by mouth daily.  [name] atorvastatin (LIPITOR) 10 MG tablet Take 1 tablet (10 mg total) by mouth at bedtime. (do not take for 3 days after taking colchicine) (Patient not taking: Reported on 03/05/2019)  . [DISCONTINUED] cephALEXin (KEFLEX) 500 MG capsule Take 1 capsule (500 mg total) by mouth every 12 (twelve) hours for 5 days.  No facility-administered encounter medications on file as of 03/05/2019.     Activities of Daily Living In your present state of health, do you have any difficulty performing the following activities: 03/05/2019 02/23/2019  Hearing? Tempie Donning  Comment wears hearing aids -  Vision? N N  Difficulty concentrating or making decisions? N N  Walking or climbing stairs? Y N  Comment recent cellulitis in foot -  Dressing or bathing? N N  Doing errands, shopping? N N  Preparing Food and eating ? N -  Using the Toilet? N -  In the past six months, have you accidently leaked urine? Y -  Do you have problems with loss of bowel control? N -  Managing your Medications? N -  Managing your Finances? N -  Housekeeping or managing your Housekeeping? N -  Some recent data might be hidden    Patient Care Team: Lada, Satira Anis, MD as PCP - General (Family Medicine) Dasher, Rayvon Char, MD (Dermatology) Lavonia Dana, MD as Consulting Physician (Internal Medicine) Marval Regal, NP as Nurse Practitioner (Nurse Practitioner) Benedetto Goad, RN as Registered Nurse    Assessment:   This is a routine wellness examination for [place].  Exercise Activities and Dietary recommendations Current Exercise Habits: The patient does not participate in regular exercise at present, Exercise limited by: orthopedic condition(s)  Goals    . DIET - INCREASE WATER INTAKE     Recommend to drink at least 6-8 8oz glasses of water per day.       Fall Risk Fall Risk  03/05/2019  09/12/2018 06/13/2018 05/18/2018 05/11/2018  Falls in the past year? 0 0 No No No  Number falls in past yr: 0 - - - -  Injury with Fall? 0 - - - -  Risk for fall due to : - - - - -  Risk for fall due to: Comment - - - - -  Follow up Falls prevention discussed - - - -   FALL RISK PREVENTION PERTAINING TO THE HOME:  Any stairs in or around the home? Yes  If so, do they handrails? Yes   Home free of loose throw rugs in walkways, pet beds, electrical cords, etc? Yes  Adequate lighting in your home to reduce risk of falls? Yes   ASSISTIVE DEVICES UTILIZED TO PREVENT FALLS:  Life alert? Yes  Use of a cane, walker or w/c? Yes  Grab bars in the bathroom? Yes  Shower chair or bench in shower? Yes  Elevated toilet seat or a handicapped toilet? Yes   DME ORDERS:  DME order needed?  No   TIMED UP AND GO:  Was the test performed? No .   Education: Fall risk prevention has been discussed.  Intervention(s) required? No   Depression Screen PHQ 2/9 Scores 03/05/2019 09/12/2018 06/13/2018 05/18/2018  PHQ - 2 Score 1 0 0 0  PHQ- 9 Score - 0 - -     Cognitive Function     6CIT Screen 03/02/2018  What Year? 0 points  What month? 0 points  What time? 0 points  Count back from 20 0 points  Months in reverse 0 points  Repeat phrase 0 points  Total Score 0    Immunization History  Administered Date(s) Administered  . Influenza, High Dose Seasonal PF 05/24/2016, 08/02/2017, 06/13/2018  . Influenza, Seasonal, Injecte, Preservative Fre 07/22/2013, 07/15/2014  . Influenza,inj,Quad PF,6+ Mos 06/19/2015  . Influenza-Unspecified 07/18/2012, 07/21/2014, 08/02/2017  . Pneumococcal Conjugate-13 05/16/2014  . Pneumococcal  Polysaccharide-23 09/26/2013  . Zoster 09/26/2010  . Zoster Recombinat (Shingrix) 07/31/2018    Qualifies for Shingles Vaccine? Yes  Shingrix series completed.  Tdap: Although this vaccine is not a covered service during a Wellness Exam, does the patient still wish to receive  this vaccine today?  No .  Education has been provided regarding the importance of this vaccine. Advised may receive this vaccine at local pharmacy or Health Dept. Aware to provide a copy of the vaccination record if obtained from local pharmacy or Health Dept. Verbalized acceptance and understanding.  Flu Vaccine: Up to date  Pneumococcal Vaccine: Up to date   Screening Tests Health Maintenance  Topic Date Due  . TETANUS/TDAP  10/03/1950  . DEXA SCAN  04/05/2019 (Originally 10/03/2016)  . INFLUENZA VACCINE  04/27/2019  . PNA vac Low Risk Adult  Completed    Cancer Screenings:  Colorectal Screening: No longer required.   Mammogram: No longer required.   Bone Density: pt declines repeat screening at this time.   Lung Cancer Screening: (Low Dose CT Chest recommended if Age 22-80 years, 30 pack-year currently smoking OR have quit w/in 15years.) does not qualify.    Additional Screening:  Hepatitis C Screening: no longer required  Vision Screening: Recommended annual ophthalmology exams for early detection of glaucoma and other disorders of the eye. Is the patient up to date with their annual eye exam?  Yes  Who is the provider or what is the name of the office in which the pt attends annual eye exams? Dr. Ellin Mayhew   Dental Screening: Recommended annual dental exams for proper oral hygiene  Community Resource Referral:  CRR required this visit?  No      Plan:     I have personally reviewed and addressed the Medicare Annual Wellness questionnaire and have noted the following in the patient's chart:  A. Medical and social history B. Use of alcohol, tobacco or illicit drugs  C. Current medications and supplements D. Functional ability and status E.  Nutritional status F.  Physical activity G. Advance directives H. List of other physicians I.  Hospitalizations, surgeries, and ER visits in previous 12 months J.  [place] such as hearing and vision if needed,  cognitive and depression L. Referrals and appointments   In addition, I have reviewed and discussed with patient certain preventive protocols, quality metrics, and best practice recommendations. A written personalized care plan for preventive services as well as general preventive health recommendations were provided to patient.   Signed,  Clemetine Marker, LPN Nurse Health Advisor    Nurse Notes: pt recently discharged from [facility] due to cellulitis from cat bite. She is staying with her daughter Levada Dy in [facility] and doing well. She reports not using the walker as much and showered today without assistance. Patient's daughter reports they did not hear from [facility] for physical therapy orders but do not feel like she is in need of it and will discuss at hospital follow up appt tomorrow with Suezanne Cheshire NP.

## 2019-03-05 NOTE — Patient Instructions (Signed)
Caitlin Jenkins , Thank you for taking time to come for your Medicare Wellness Visit. I appreciate your ongoing commitment to your health goals. Please review the following plan we discussed and let me know if I can assist you in the future.   Screening recommendations/referrals: Colonoscopy: no longer required Mammogram: no longer required Bone Density: postponed Recommended yearly ophthalmology/optometry visit for glaucoma screening and checkup Recommended yearly dental visit for hygiene and checkup  Vaccinations: Influenza vaccine: postponed Pneumococcal vaccine: done 05/16/14 Tdap vaccine: due - please contact us if you get a cut or scrape Shingles vaccine: Shingrix series completed 11/07/18  Conditions/risks identified: Recommend drinking 6-8 glasses of water per day  Next appointment: Please follow up in one year for your Medicare Annual Wellness visit.     Preventive Care 23 Years and Older, Female Preventive care refers to lifestyle choices and visits with your health care provider that can promote health and wellness. What does preventive care include?  A yearly physical exam. This is also called an annual well check.  Dental exams once or twice a year.  Routine eye exams. Ask your health care provider how often you should have your eyes checked.  Personal lifestyle choices, including:  Daily care of your teeth and gums.  Regular physical activity.  Eating a healthy diet.  Avoiding tobacco and drug use.  Limiting alcohol use.  Practicing safe sex.  Taking low-dose aspirin every day.  Taking vitamin and mineral supplements as recommended by your health care provider. What happens during an annual well check? The services and screenings done by your health care provider during your annual well check will depend on your age, overall health, lifestyle risk factors, and family history of disease. Counseling  Your health care provider may ask you questions about  your:  Alcohol use.  Tobacco use.  Drug use.  Emotional well-being.  Home and relationship well-being.  Sexual activity.  Eating habits.  History of falls.  Memory and ability to understand (cognition).  Work and work Statistician.  Reproductive health. Screening  You may have the following tests or measurements:  Height, weight, and BMI.  Blood pressure.  Lipid and cholesterol levels. These may be checked every 5 years, or more frequently if you are over 72 years old.  Skin check.  Lung cancer screening. You may have this screening every year starting at age 87 if you have a 30-pack-year history of smoking and currently smoke or have quit within the past 15 years.  Fecal occult blood test (FOBT) of the stool. You may have this test every year starting at age 35.  Flexible sigmoidoscopy or colonoscopy. You may have a sigmoidoscopy every 5 years or a colonoscopy every 10 years starting at age 37.  Hepatitis C blood test.  Hepatitis B blood test.  Sexually transmitted disease (STD) testing.  Diabetes screening. This is done by checking your blood sugar (glucose) after you have not eaten for a while (fasting). You may have this done every 1-3 years.  Bone density scan. This is done to screen for osteoporosis. You may have this done starting at age 25.  Mammogram. This may be done every 1-2 years. Talk to your health care provider about how often you should have regular mammograms. Talk with your health care provider about your test results, treatment options, and if necessary, the need for more tests. Vaccines  Your health care provider may recommend certain vaccines, such as:  Influenza vaccine. This is recommended every year.  Tetanus,  diphtheria, and acellular pertussis (Tdap, Td) vaccine. You may need a Td booster every 10 years.  Zoster vaccine. You may need this after age 65.  Pneumococcal 13-valent conjugate (PCV13) vaccine. One dose is recommended  after age 30.  Pneumococcal polysaccharide (PPSV23) vaccine. One dose is recommended after age 71. Talk to your health care provider about which screenings and vaccines you need and how often you need them. This information is not intended to replace advice given to you by your health care provider. Make sure you discuss any questions you have with your health care provider. Document Released: 10/09/2015 Document Revised: 06/01/2016 Document Reviewed: 07/14/2015 Elsevier Interactive Patient Education  2017 [place] Prevention in the Home Falls can cause injuries. They can happen to people of all ages. There are many things you can do to make your home safe and to help prevent falls. What can I do on the outside of my home?  Regularly fix the edges of walkways and driveways and fix any cracks.  Remove anything that might make you trip as you walk through a door, such as a raised step or threshold.  Trim any bushes or trees on the path to your home.  Use bright outdoor lighting.  Clear any walking paths of anything that might make someone trip, such as rocks or tools.  Regularly check to see if handrails are loose or broken. Make sure that both sides of any steps have handrails.  Any raised decks and porches should have guardrails on the edges.  Have any leaves, snow, or ice cleared regularly.  Use sand or salt on walking paths during winter.  Clean up any spills in your garage right away. This includes oil or grease spills. What can I do in the bathroom?  Use night lights.  Install grab bars by the toilet and in the tub and shower. Do not use towel bars as grab bars.  Use non-skid mats or decals in the tub or shower.  If you need to sit down in the shower, use a plastic, non-slip stool.  Keep the floor dry. Clean up any water that spills on the floor as soon as it happens.  Remove soap buildup in the tub or shower regularly.  Attach bath mats securely with  double-sided non-slip rug tape.  Do not have throw rugs and other things on the floor that can make you trip. What can I do in the bedroom?  Use night lights.  Make sure that you have a light by your bed that is easy to reach.  Do not use any sheets or blankets that are too big for your bed. They should not hang down onto the floor.  Have a firm chair that has side arms. You can use this for support while you get dressed.  Do not have throw rugs and other things on the floor that can make you trip. What can I do in the kitchen?  Clean up any spills right away.  Avoid walking on wet floors.  Keep items that you use a lot in easy-to-reach places.  If you need to reach something above you, use a strong step stool that has a grab bar.  Keep electrical cords out of the way.  Do not use floor polish or wax that makes floors slippery. If you must use wax, use non-skid floor wax.  Do not have throw rugs and other things on the floor that can make you trip. What can I do with  my stairs?  Do not leave any items on the stairs.  Make sure that there are handrails on both sides of the stairs and use them. Fix handrails that are broken or loose. Make sure that handrails are as long as the stairways.  Check any carpeting to make sure that it is firmly attached to the stairs. Fix any carpet that is loose or worn.  Avoid having throw rugs at the top or bottom of the stairs. If you do have throw rugs, attach them to the floor with carpet tape.  Make sure that you have a light switch at the top of the stairs and the bottom of the stairs. If you do not have them, ask someone to add them for you. What else can I do to help prevent falls?  Wear shoes that:  Do not have high heels.  Have rubber bottoms.  Are comfortable and fit you well.  Are closed at the toe. Do not wear sandals.  If you use a stepladder:  Make sure that it is fully opened. Do not climb a closed stepladder.  Make  sure that both sides of the stepladder are locked into place.  Ask someone to hold it for you, if possible.  Clearly mark and make sure that you can see:  Any grab bars or handrails.  First and last steps.  Where the edge of each step is.  Use tools that help you move around (mobility aids) if they are needed. These include:  Canes.  Walkers.  Scooters.  Crutches.  Turn on the lights when you go into a dark area. Replace any light bulbs as soon as they burn out.  Set up your furniture so you have a clear path. Avoid moving your furniture around.  If any of your floors are uneven, fix them.  If there are any pets around you, be aware of where they are.  Review your medicines with your doctor. Some medicines can make you feel dizzy. This can increase your chance of falling. Ask your doctor what other things that you can do to help prevent falls. This information is not intended to replace advice given to you by your health care provider. Make sure you discuss any questions you have with your health care provider. Document Released: 07/09/2009 Document Revised: 02/18/2016 Document Reviewed: 10/17/2014 Elsevier Interactive Patient Education  2017 [employer].

## 2019-03-06 ENCOUNTER — Ambulatory Visit (INDEPENDENT_AMBULATORY_CARE_PROVIDER_SITE_OTHER): Payer: PPO | Admitting: Nurse Practitioner

## 2019-03-06 ENCOUNTER — Other Ambulatory Visit: Payer: Self-pay

## 2019-03-06 ENCOUNTER — Encounter: Payer: Self-pay | Admitting: Nurse Practitioner

## 2019-03-06 VITALS — BP 122/68 | HR 94 | Temp 98.0°F | Resp 14 | Ht 64.0 in | Wt 155.3 lb

## 2019-03-06 DIAGNOSIS — A281 Cat-scratch disease: Secondary | ICD-10-CM | POA: Diagnosis not present

## 2019-03-06 DIAGNOSIS — L0390 Cellulitis, unspecified: Secondary | ICD-10-CM

## 2019-03-06 DIAGNOSIS — M25471 Effusion, right ankle: Secondary | ICD-10-CM | POA: Diagnosis not present

## 2019-03-06 DIAGNOSIS — E871 Hypo-osmolality and hyponatremia: Secondary | ICD-10-CM

## 2019-03-06 DIAGNOSIS — A419 Sepsis, unspecified organism: Secondary | ICD-10-CM

## 2019-03-06 DIAGNOSIS — Z09 Encounter for follow-up examination after completed treatment for conditions other than malignant neoplasm: Secondary | ICD-10-CM | POA: Diagnosis not present

## 2019-03-06 MED ORDER — DOXYCYCLINE HYCLATE 100 MG PO TABS: 100.0000 mg | ORAL_TABLET | Freq: Two times a day (BID) | ORAL | 0 refills | Status: DC

## 2019-03-06 MED ORDER — FUROSEMIDE 20 MG PO TABS: 20.0000 mg | ORAL_TABLET | Freq: Every day | ORAL | 0 refills | Status: DC

## 2019-03-06 NOTE — Patient Instructions (Addendum)
-   Keep leg elevated when resting - Use compression stocking or ace bandage to provide compression during the day time and take them off at night time- and prop your leg up on a pillow at night - Start taking doxycycline twice a day for the next 7 days with food. Please take your antibiotic as prescribed with food on your stomach to prevent nausea and upset stomach. Take the antibiotic for the entire course, even if your symptoms resolve before the course if completed. Using antibiotics inappropriately can make it harder to treat future infections. If you have any concerning side effects please stop the medication and let us know immediately. I do recommend taking a probiotic to replenish your good gut health anytime you take an antibiotic. You can get probiotics in types of yogurt like activia, or other food/drinks such as kimchi, kombucha , sauerkraut, or you can take an over the counter supplement.   - If redness increases or you develop fevers/chills please get immediate medical attention - After your lab results come back we will let you know if you should start the lasix.  - The swelling may fluctuate throughout the day and take 2-4 weeks to resolve.

## 2019-03-07 ENCOUNTER — Encounter: Payer: Self-pay | Admitting: Family Medicine

## 2019-03-07 ENCOUNTER — Other Ambulatory Visit: Payer: Self-pay | Admitting: Family Medicine

## 2019-03-07 LAB — MAGNESIUM: Magnesium: 1.2 mg/dL — ABNORMAL LOW (ref 1.5–2.5)

## 2019-03-07 LAB — BASIC METABOLIC PANEL
BUN/Creatinine Ratio: 16 (calc) (ref 6–22)
BUN: 17 mg/dL (ref 7–25)
CO2: 28 mmol/L (ref 20–32)
Calcium: 9.5 mg/dL (ref 8.6–10.4)
Chloride: 96 mmol/L — ABNORMAL LOW (ref 98–110)
Creat: 1.09 mg/dL — ABNORMAL HIGH (ref 0.60–0.88)
Glucose, Bld: 82 mg/dL (ref 65–99)
Potassium: 4.6 mmol/L (ref 3.5–5.3)
Sodium: 133 mmol/L — ABNORMAL LOW (ref 135–146)

## 2019-03-07 MED ORDER — MAGNESIUM OXIDE 250 MG PO TABS: 1.0000 | ORAL_TABLET | Freq: Every day | ORAL | 0 refills | Status: DC

## 2019-03-08 ENCOUNTER — Ambulatory Visit: Payer: Self-pay

## 2019-03-08 ENCOUNTER — Telehealth: Payer: Self-pay

## 2019-03-08 DIAGNOSIS — Z09 Encounter for follow-up examination after completed treatment for conditions other than malignant neoplasm: Secondary | ICD-10-CM

## 2019-03-08 DIAGNOSIS — A281 Cat-scratch disease: Secondary | ICD-10-CM

## 2019-03-08 DIAGNOSIS — L0390 Cellulitis, unspecified: Secondary | ICD-10-CM

## 2019-03-08 NOTE — Chronic Care Management (AMB) (Signed)
  Chronic Care Management   Note  03/08/2019 Name: Caitlin Jenkins MRN: [id number] DOB: 07-Dec-1931  Care Coordination: Successful telephone encounter to Ms. Caitlin Jenkins, daughter of the above patient. Ms. Caitlin Jenkins was scheduled for an initial telephone appointment this morning with RN CM however there was mis-communication regarding who to call. Ms. Caitlin Jenkins has recently been discharge from [facility] for sepsis and is now residing with her daughter in [facility] until she is fully recuperated.   This morning, RN CM called patient's home, left message requesting call back as appointment note to call daughter was not discovered until now.  Goals Addressed            This Visit's Progress   . patient daughter stated "the swelling in her legs is almost gone. Should I start the lasix?" (pt-stated)       During post hospital follow up visit with PCP, it was noted that patients cellulitis was worsening along with increased edema. Antibiotics were reinitiated. Patient was also prescribed lasix and magox to be taken if renal function was improved. Daughter has also been using compression bandages to assist with swelling.   During conversation with RN CM. Daughter questions if lasix and mag needs to be started if edema is almost gone.   Current Barriers:  [name] Knowledge Deficits related to under standing if she should give mother lasix and magnesium now that the leg swelling has improved  Nurse Case Manager Clinical Goal(s):  [name] Patient/caregiver will understand and follow orders for medication administration of lasix and magox  Interventions:  . Collaborated with PCP Caitlin Ensign, FNP regarding patient's improved swelling and need for lasix and magnesium . Informed patients daughter to NOT start lasix or magnesium since swelling is improved.  Patient Self Care Activities:  . Calls provider office for new concerns or questions or worsening condition  Initial goal documentation        Plan:  Initial assessment rescheduled for Monday 03/11/2019 at 2:30    Caitlin Jenkins. Caitlin Rotunda, RN, BSN Nurse Care Coordinator [facility] / [facility] Care Management  [phone]

## 2019-03-08 NOTE — Patient Instructions (Signed)
  Thank you allowing the Chronic Care Management Team to be a part of your care! It was a pleasure speaking with you today!  1. DO NOT START LASIX OR MAG OX 2. PLEASE CALL PCP IF CELLULITIS WORSENS  CCM (Chronic Care Management) Team   Trish Fountain RN, BSN Nurse Care Coordinator  [phone]  Ruben Reason PharmD  Clinical Pharmacist  [phone]   [place], LCSW Clinical Social Worker [phone]  Goals Addressed            This Visit's Progress   . patient daughter stated "the swelling in her legs is almost gone. Should I start the lasix?" (pt-stated)       Current Barriers:  [name] Knowledge Deficits related to under standing if she should give mother lasix and magnesium now that the leg swelling has improved  Nurse Case Manager Clinical Goal(s):  [name] Patient/caregiver will understand and follow orders for medication administration of lasix and magox  Interventions:  . Collaborated with PCP Raelyn Ensign, FNP regarding patient's improved swelling and need for lasix and magnesium . Informed patients daughter to NOT start lasix or magnesium since swelling is improved.  Patient Self Care Activities:  . Calls provider office for new concerns or questions or worsening condition  Initial goal documentation        The patient verbalized understanding of instructions provided today and declined a print copy of patient instruction materials.   Telephone follow up appointment with care management team member scheduled for: next week  SYMPTOMS OF A STROKE   You have any symptoms of stroke. "BE FAST" is an easy way to remember the main warning signs: ? B - Balance. Signs are dizziness, sudden trouble walking, or loss of balance. ? E - Eyes. Signs are trouble seeing or a sudden change in how you see. ? F - Face. Signs are sudden weakness or loss of feeling of the face, or the face or eyelid drooping on one side. ? A - Arms. Signs are weakness or loss of feeling in an  arm. This happens suddenly and usually on one side of the body. ? S - Speech. Signs are sudden trouble speaking, slurred speech, or trouble understanding what people say. ? T - Time. Time to call emergency services. Write down what time symptoms started.  You have other signs of stroke, such as: ? A sudden, very bad headache with no known cause. ? Feeling sick to your stomach (nausea). ? Throwing up (vomiting). ? Jerky movements you cannot control (seizure).  SYMPTOMS OF A HEART ATTACK  What are the signs or symptoms? Symptoms of this condition include:  Chest pain. It may feel like: ? Crushing or squeezing. ? Tightness, pressure, fullness, or heaviness.  Pain in the arm, neck, jaw, back, or upper body.  Shortness of breath.  Heartburn.  Indigestion.  Nausea.  Cold sweats.  Feeling tired.  Sudden lightheadedness.

## 2019-03-08 NOTE — Chronic Care Management (AMB) (Signed)
   Chronic Care Management   Unsuccessful Call Note 03/08/2019 Name: NICOLA HEINEMANN MRN: [id number] DOB: [birth year]  Milagros Loll is a 85 year old female who sees Enid Derry, MD for primary care. Ms. Schreifels's health plan asked the CCM team to consult the patient for care coordination and chronic case management. Patient's last office visit was 03/06/2019 for hospital follow up post discharge for cat scratch fever/sepsis.     Was unable to reach patient via telephone today for initial assessment and goals setting appointment. I have left HIPAA compliant voicemail asking patient to return my call. (unsuccessful outreach #1).   Plan: Will follow-up within 7 business days via telephone.      Renaldo Gornick E. Rollene Rotunda, RN, BSN Nurse Care Coordinator [facility] / [facility] Care Management  [phone]

## 2019-03-11 ENCOUNTER — Ambulatory Visit (INDEPENDENT_AMBULATORY_CARE_PROVIDER_SITE_OTHER): Payer: PPO

## 2019-03-11 ENCOUNTER — Other Ambulatory Visit: Payer: Self-pay

## 2019-03-11 DIAGNOSIS — L0390 Cellulitis, unspecified: Secondary | ICD-10-CM

## 2019-03-11 DIAGNOSIS — I10 Essential (primary) hypertension: Secondary | ICD-10-CM

## 2019-03-11 NOTE — Chronic Care Management (AMB) (Signed)
Chronic Care Management   Follow Up Note   03/11/2019 Name: Caitlin Jenkins MRN: [id number] DOB: 06-18-1932  Referred by: Arnetha Courser, MD Reason for referral : Chronic Care Management (initial assessment)   Subjective: "My leg is doing much better and I think I am going back home on Friday of this week"   Objective:  BP Readings from Last 3 Encounters:  03/06/19 122/68  03/05/19 140/80  02/28/19 (!) 166/96     Chemistry      Component Value Date/Time   NA 133 (L) 03/06/2019 1222   NA 131 (L) 03/31/2017 0819   K 4.6 03/06/2019 1222   CL 96 (L) 03/06/2019 1222   CO2 28 03/06/2019 1222   BUN 17 03/06/2019 1222   BUN 22 03/31/2017 0819   CREATININE 1.09 (H) 03/06/2019 1222      Component Value Date/Time   CALCIUM 9.5 03/06/2019 1222   ALKPHOS 93 02/23/2019 1331   AST 27 02/23/2019 1331   ALT 13 02/23/2019 1331   BILITOT 0.8 02/23/2019 1331   BILITOT 0.4 05/24/2016 1116      Assessment: Caitlin Jenkins is a 85 year old femalewho sees Caitlin Derry, MD for primary care. Caitlin Jenkins's health planasked the CCM team to consult the patient for care coordination and chronic case management. Patient's last office visit was 03/06/2019 for hospital follow up post discharge for cat scratch fever/sepsis. Today, RN CM completed telephone health assessment and discussed ongoing case management opportunities with patient.  Review of patient status, including review of consultants reports, relevant laboratory and other test results, and collaboration with appropriate care team members and the patient's provider was performed as part of comprehensive patient evaluation and provision of chronic care management services.    Goals Addressed            This Visit's Progress   . I think I am doing very well now managing my health (pt-stated)       Caitlin Jenkins states she is doing very well post hospitalization for cat scratch fever. She will complete her post discharge  antibiotic therapy this coming Saturday morning. She states her leg is no longer edematous and the redness has much improved. She plans to transition back to her home from her daughters this weekend. She was able to shower independently without feeling as if she was a fall risk today. She is taking all medications as prescribed. She understands her chronic conditions, however does not follow a low NA diet for hypertension and CKD because her hyponatremia. She has been told in the past to not restrict her sodium. She is not checking her BP but states Dr. Sanda Jenkins told her it was not as important for her to monitor at home because she is controlled with medications.  Current Barriers:  . Hearing loss . Chronic Disease Management Support  Nurse Case Manager Clinical Goal(s):  [name] Over the next 60 days, patient will not experience hospital admission. Hospital Admissions in last 6 months = 1 . Over the next 21 days, patient will attend all scheduled medical appointments: 6/16 at 1:40 with Caitlin Jenkins . Over the next 30 days, the patient will demonstrate ongoing self health care management ability as evidenced by taking medications as prescribed, attending all medical appointments, remaining active, and calling CCM RN CM is additional needs arise*  Interventions:  . Reviewed medications with patient and discussed importance of adherence . Discussed plans with patient for ongoing care management follow up and provided patient  with direct contact information for care management team . Reviewed scheduled/upcoming provider appointments including: 03/12/2019 at 1:40 with PCP . Completed initial health assessment and assessed for disease management education need . Reviewed recent hospitalization notes in EMR . Discussed readiness of upcoming transition from daughters home post hospitalization back to independent living at her own residence  Patient Self Care Activities:  . Self administers medications as  prescribed . Attends all scheduled provider appointments . Calls pharmacy for medication refills . Attends church or other social activities . Performs ADL's independently . Performs IADL's independently . Calls provider office for new concerns or questions  Initial goal documentation     . COMPLETED: patient daughter stated "the swelling in her legs is almost gone. Should I start the lasix?" (pt-stated)       Current Barriers:  [name] Knowledge Deficits related to under standing if she should give mother lasix and magnesium now that the leg swelling has improved  Nurse Case Manager Clinical Goal(s):  [name] Patient/caregiver will understand and follow orders for medication administration of lasix and magox  Interventions:  . Collaborated with PCP Caitlin Ensign, FNP regarding patient's improved swelling and need for lasix and magnesium . Informed patients daughter to NOT start lasix or magnesium since swelling is improved.  Patient Self Care Activities:  . Calls provider office for new concerns or questions or worsening condition  Initial goal documentation         Telephone follow up appointment with care management team member scheduled for: 04/01/2019 at 3:00     Caitlin How E. Rollene Rotunda, RN, BSN Nurse Care Coordinator [facility] Care Management  [phone]

## 2019-03-11 NOTE — Patient Instructions (Addendum)
Thank you allowing the Chronic Care Management Team to be a part of your care! It was a pleasure speaking with you today!  1. Continue to take your medications as prescribed like we discussed today. Make sure you are taking your levothyroxine first thing in the morning and wait 30 min to 1 hour before you eat. 2. Remember to take all your antibiotics until completed. Please contact Suezanne Cheshire if you notice any increased swelling or redness. DO NOT WAIT 3. You may want to check your BP at least weekly just to make sure your BP remains stable between your doctors appointments.   CCM (Chronic Care Management) Team   Trish Fountain RN, BSN Nurse Care Coordinator  [phone]  Ruben Reason PharmD  Clinical Pharmacist  [phone]   Elliot Gurney, LCSW Clinical Social Worker [phone]  Goals Addressed            This Visit's Progress   . I think I am doing very well now managing my health (pt-stated)       Current Barriers:  . Hearing loss . Chronic Disease Management Support  Nurse Case Manager Clinical Goal(s):  [name] Over the next 60 days, patient will not experience hospital admission. Hospital Admissions in last 6 months = 1 . Over the next 21 days, patient will attend all scheduled medical appointments: 6/16 at 1:40 with Suezanne Cheshire . Over the next 30 days, the patient will demonstrate ongoing self health care management ability as evidenced by taking medications as prescribed, attending all medical appointments, remaining active, and calling CCM RN CM is additional needs arise*  Interventions:  . Reviewed medications with patient and discussed importance of adherence . Discussed plans with patient for ongoing care management follow up and provided patient with direct contact information for care management team . Reviewed scheduled/upcoming provider appointments including: 03/12/2019 at 1:40 with PCP . Completed initial health assessment and assessed for  disease management education need . Reviewed recent hospitalization notes in EMR . Discussed readiness of upcoming transition from daughters home post hospitalization back to independent living at her own residence  Patient Self Care Activities:  . Self administers medications as prescribed . Attends all scheduled provider appointments . Calls pharmacy for medication refills . Attends church or other social activities . Performs ADL's independently . Performs IADL's independently . Calls provider office for new concerns or questions  Initial goal documentation     . COMPLETED: patient daughter stated "the swelling in her legs is almost gone. Should I start the lasix?" (pt-stated)       Current Barriers:  [name] Knowledge Deficits related to under standing if she should give mother lasix and magnesium now that the leg swelling has improved  Nurse Case Manager Clinical Goal(s):  [name] Patient/caregiver will understand and follow orders for medication administration of lasix and magox  Interventions:  . Collaborated with PCP Raelyn Ensign, FNP regarding patient's improved swelling and need for lasix and magnesium . Informed patients daughter to NOT start lasix or magnesium since swelling is improved.  Patient Self Care Activities:  . Calls provider office for new concerns or questions or worsening condition  Initial goal documentation        Print copy of patient instructions provided.   Telephone follow up appointment with care management team member scheduled for:  04/01/2019 at 3:00  SYMPTOMS OF A STROKE   You have any symptoms of stroke. "BE FAST" is an easy way to remember the main warning signs: ?  B - Balance. Signs are dizziness, sudden trouble walking, or loss of balance. ? E - Eyes. Signs are trouble seeing or a sudden change in how you see. ? F - Face. Signs are sudden weakness or loss of feeling of the face, or the face or eyelid drooping on one side. ? A - Arms. Signs are  weakness or loss of feeling in an arm. This happens suddenly and usually on one side of the body. ? S - Speech. Signs are sudden trouble speaking, slurred speech, or trouble understanding what people say. ? T - Time. Time to call emergency services. Write down what time symptoms started.  You have other signs of stroke, such as: ? A sudden, very bad headache with no known cause. ? Feeling sick to your stomach (nausea). ? Throwing up (vomiting). ? Jerky movements you cannot control (seizure).  SYMPTOMS OF A HEART ATTACK  What are the signs or symptoms? Symptoms of this condition include:  Chest pain. It may feel like: ? Crushing or squeezing. ? Tightness, pressure, fullness, or heaviness.  Pain in the arm, neck, jaw, back, or upper body.  Shortness of breath.  Heartburn.  Indigestion.  Nausea.  Cold sweats.  Feeling tired.  Sudden lightheadedness.   Cellulitis, Adult  Cellulitis is a skin infection. The infected area is usually warm, red, swollen, and tender. This condition occurs most often in the arms and lower legs. The infection can travel to the muscles, blood, and underlying tissue and become serious. It is very important to get treated for this condition. What are the causes? Cellulitis is caused by bacteria. The bacteria enter through a break in the skin, such as a cut, burn, insect bite, open sore, or crack. What increases the risk? This condition is more likely to occur in people who: Have a weak body defense system (immune system). Have open wounds on the skin, such as cuts, burns, bites, and scrapes. Bacteria can enter the body through these open wounds. Are older than 83 years of age. Have diabetes. Have a type of long-lasting (chronic) liver disease (cirrhosis) or kidney disease. Are obese. Have a skin condition such as: Itchy rash (eczema). Slow movement of blood in the veins (venous stasis). Fluid buildup below the skin (edema). Have had radiation  therapy. Use IV drugs. What are the signs or symptoms? Symptoms of this condition include: Redness, streaking, or spotting on the skin. Swollen area of the skin. Tenderness or pain when an area of the skin is touched. Warm skin. A fever. Chills. Blisters. How is this diagnosed? This condition is diagnosed based on a medical history and physical exam. You may also have tests, including: Blood tests. Imaging tests. How is this treated? Treatment for this condition may include: Medicines, such as antibiotic medicines or medicines to treat allergies (antihistamines). Supportive care, such as rest and application of cold or warm cloths (compresses) to the skin. Hospital care, if the condition is severe. The infection usually starts to get better within 1-2 days of treatment. Follow these instructions at home:  Medicines Take over-the-counter and prescription medicines only as told by your health care provider. If you were prescribed an antibiotic medicine, take it as told by your health care provider. Do not stop taking the antibiotic even if you start to feel better. General instructions Drink enough fluid to keep your urine pale yellow. Do not touch or rub the infected area. Raise (elevate) the infected area above the level of your heart while you are  sitting or lying down. Apply warm or cold compresses to the affected area as told by your health care provider. Keep all follow-up visits as told by your health care provider. This is important. These visits let your health care provider make sure a more serious infection is not developing. Contact a health care provider if: You have a fever. Your symptoms do not begin to improve within 1-2 days of starting treatment. Your bone or joint underneath the infected area becomes painful after the skin has healed. Your infection returns in the same area or another area. You notice a swollen bump in the infected area. You develop new  symptoms. You have a general ill feeling (malaise) with muscle aches and pains. Get help right away if: Your symptoms get worse. You feel very sleepy. You develop vomiting or diarrhea that persists. You notice red streaks coming from the infected area. Your red area gets larger or turns dark in color. These symptoms may represent a serious problem that is an emergency. Do not wait to see if the symptoms will go away. Get medical help right away. Call your local emergency services (911 in the [place] Do not drive yourself to the hospital. Summary Cellulitis is a skin infection. This condition occurs most often in the arms and lower legs. Treatment for this condition may include medicines, such as antibiotic medicines or antihistamines. Take over-the-counter and prescription medicines only as told by your health care provider. If you were prescribed an antibiotic medicine, do not stop taking the antibiotic even if you start to feel better. Contact a health care provider if your symptoms do not begin to improve within 1-2 days of starting treatment or your symptoms get worse. Keep all follow-up visits as told by your health care provider. This is important. These visits let your health care provider make sure that a more serious infection is not developing. This information is not intended to replace advice given to you by your health care provider. Make sure you discuss any questions you have with your health care provider. Document Released: 06/22/2005 Document Revised: 02/01/2018 Document Reviewed: 02/01/2018 Elsevier Interactive Patient Education  2019 [employer]     Managing Your Hypertension Hypertension is commonly called high blood pressure. This is when the force of your blood pressing against the walls of your arteries is too strong. Arteries are blood vessels that carry blood from your heart throughout your body. Hypertension forces the heart to work harder to pump blood, and may  cause the arteries to become narrow or stiff. Having untreated or uncontrolled hypertension can cause heart attack, stroke, kidney disease, and other problems. What are blood pressure readings? A blood pressure reading consists of a higher number over a lower number. Ideally, your blood pressure should be below 120/80. The first ("top") number is called the systolic pressure. It is a measure of the pressure in your arteries as your heart beats. The second ("bottom") number is called the diastolic pressure. It is a measure of the pressure in your arteries as the heart relaxes. What does my blood pressure reading mean? Blood pressure is classified into four stages. Based on your blood pressure reading, your health care provider may use the following stages to determine what type of treatment you need, if any. Systolic pressure and diastolic pressure are measured in a unit called mm Hg. Normal  Systolic pressure: below 378.  Diastolic pressure: below 80. Elevated  Systolic pressure: 588-502.  Diastolic pressure: below 80. Hypertension stage 1  Systolic pressure: 161-096.  Diastolic pressure: 04-54. Hypertension stage 2  Systolic pressure: 098 or above.  Diastolic pressure: 90 or above. What health risks are associated with hypertension? Managing your hypertension is an important responsibility. Uncontrolled hypertension can lead to:  A heart attack.  A stroke.  A weakened blood vessel (aneurysm).  Heart failure.  Kidney damage.  Eye damage.  Metabolic syndrome.  Memory and concentration problems. What changes can I make to manage my hypertension? Hypertension can be managed by making lifestyle changes and possibly by taking medicines. Your health care provider will help you make a plan to bring your blood pressure within a normal range. Eating and drinking   Eat a diet that is high in fiber and potassium, and low in salt (sodium), added sugar, and fat. An example eating  plan is called the DASH (Dietary Approaches to Stop Hypertension) diet. To eat this way: ? Eat plenty of fresh fruits and vegetables. Try to fill half of your plate at each meal with fruits and vegetables. ? Eat whole grains, such as whole wheat pasta, brown rice, or whole grain bread. Fill about one quarter of your plate with whole grains. ? Eat low-fat diary products. ? Avoid fatty cuts of meat, processed or cured meats, and poultry with skin. Fill about one quarter of your plate with lean proteins such as fish, chicken without skin, beans, eggs, and tofu. ? Avoid premade and processed foods. These tend to be higher in sodium, added sugar, and fat.  Reduce your daily sodium intake. Most people with hypertension should eat less than 1,500 mg of sodium a day.  Limit alcohol intake to no more than 1 drink a day for nonpregnant women and 2 drinks a day for men. One drink equals 12 oz of beer, 5 oz of wine, or 1 oz of hard liquor. Lifestyle  Work with your health care provider to maintain a healthy body weight, or to lose weight. Ask what an ideal weight is for you.  Get at least 30 minutes of exercise that causes your heart to beat faster (aerobic exercise) most days of the week. Activities may include walking, swimming, or biking.  Include exercise to strengthen your muscles (resistance exercise), such as weight lifting, as part of your weekly exercise routine. Try to do these types of exercises for 30 minutes at least 3 days a week.  Do not use any products that contain nicotine or tobacco, such as cigarettes and e-cigarettes. If you need help quitting, ask your health care provider.  Control any long-term (chronic) conditions you have, such as high cholesterol or diabetes. Monitoring  Monitor your blood pressure at home as told by your health care provider. Your personal target blood pressure may vary depending on your medical conditions, your age, and other factors.  Have your blood  pressure checked regularly, as often as told by your health care provider. Working with your health care provider  Review all the medicines you take with your health care provider because there may be side effects or interactions.  Talk with your health care provider about your diet, exercise habits, and other lifestyle factors that may be contributing to hypertension.  Visit your health care provider regularly. Your health care provider can help you create and adjust your plan for managing hypertension. Will I need medicine to control my blood pressure? Your health care provider may prescribe medicine if lifestyle changes are not enough to get your blood pressure under control, and if:  Your  systolic blood pressure is 130 or higher.  Your diastolic blood pressure is 80 or higher. Take medicines only as told by your health care provider. Follow the directions carefully. Blood pressure medicines must be taken as prescribed. The medicine does not work as well when you skip doses. Skipping doses also puts you at risk for problems. Contact a health care provider if:  You think you are having a reaction to medicines you have taken.  You have repeated (recurrent) headaches.  You feel dizzy.  You have swelling in your ankles.  You have trouble with your vision. Get help right away if:  You develop a severe headache or confusion.  You have unusual weakness or numbness, or you feel faint.  You have severe pain in your chest or abdomen.  You vomit repeatedly.  You have trouble breathing. Summary  Hypertension is when the force of blood pumping through your arteries is too strong. If this condition is not controlled, it may put you at risk for serious complications.  Your personal target blood pressure may vary depending on your medical conditions, your age, and other factors. For most people, a normal blood pressure is less than 120/80.  Hypertension is managed by lifestyle changes,  medicines, or both. Lifestyle changes include weight loss, eating a healthy, low-sodium diet, exercising more, and limiting alcohol. This information is not intended to replace advice given to you by your health care provider. Make sure you discuss any questions you have with your health care provider. Document Released: 06/06/2012 Document Revised: 08/10/2016 Document Reviewed: 08/10/2016 Elsevier Interactive Patient Education  2019 [employer].

## 2019-03-12 ENCOUNTER — Encounter: Payer: Self-pay | Admitting: Nurse Practitioner

## 2019-03-12 ENCOUNTER — Telehealth: Payer: Self-pay

## 2019-03-12 ENCOUNTER — Ambulatory Visit (INDEPENDENT_AMBULATORY_CARE_PROVIDER_SITE_OTHER): Payer: PPO | Admitting: Nurse Practitioner

## 2019-03-12 VITALS — BP 132/78 | HR 84 | Temp 97.7°F | Resp 14 | Ht 64.0 in | Wt 153.8 lb

## 2019-03-12 DIAGNOSIS — L0390 Cellulitis, unspecified: Secondary | ICD-10-CM

## 2019-03-12 DIAGNOSIS — I10 Essential (primary) hypertension: Secondary | ICD-10-CM | POA: Diagnosis not present

## 2019-03-12 DIAGNOSIS — E782 Mixed hyperlipidemia: Secondary | ICD-10-CM | POA: Diagnosis not present

## 2019-03-12 MED ORDER — ATORVASTATIN CALCIUM 10 MG PO TABS: 10.0000 mg | ORAL_TABLET | Freq: Every day | ORAL | 1 refills | Status: DC

## 2019-03-12 MED ORDER — AMLODIPINE BESYLATE 2.5 MG PO TABS: 2.5000 mg | ORAL_TABLET | Freq: Every day | ORAL | 1 refills | Status: DC

## 2019-03-12 MED ORDER — LOSARTAN POTASSIUM 100 MG PO TABS: ORAL_TABLET | ORAL | 1 refills | Status: DC

## 2019-03-12 NOTE — Patient Instructions (Signed)
Foods with Top Source of Magnesium These types of fish are swimming in the mineral magnesium:  [place] salmon Halibut Atlantic mackerel Atlantic pollock Vegetables and Fruits That Have Magnesium Prickly pear has a lot of magnesium, but it isn't the easiest food to find or prepare.  Focus instead on these fruits and vegetables that have a lot of magnesium when you cook them and plenty of other nutrients, too:  Spinach Swiss chard Edamame Tamarind Potato with skin Okra Whole-Grain Products With Magnesium Look for breakfast cereals fortified with magnesium and these whole grains:  Bran cereals Wheat germ (toasted) Quinoa (cooked) Legumes, Nuts, and Seeds With Magnesium Meat and poultry don't have a lot of magnesium, but you can find it in soy, cheese, and yogurt.  These meat alternatives are also good magnesium sources:  Black-eyed peas (cooked) Tempeh (cooked) Soy nuts Cooked beans (black, lima, navy, pinto, chickpeas) Tofu Almonds Cashews Flaxseed Peanut butter

## 2019-03-12 NOTE — Telephone Encounter (Signed)
Discussed at visit today.

## 2019-03-12 NOTE — Progress Notes (Signed)
Name: Caitlin Jenkins   MRN: [id number]    DOB: [birth year]   Date:03/12/2019       Progress Note  Subjective  Chief Complaint  Chief Complaint  Patient presents with  . Follow-up    HPI  Patient presents for one week follow-up on cellulitis on right lower extremity.  was admitted from 5/30-02/28/2019 at [facility] for cellulitis caused by cat scratch that turned into sepsis, seen in office on 03/06/2019 and started on doxycycline due to persistent redness and swelling that had initially improved and then worsened. Patient notes that swelling and redness have vastly improved, pain is minimal now. Has been using ace bandage and elevation for swelling and taking antibiotic as prescribed. No nausea, vomiting or diarrhea, fevers or chills. She did not start her magnesium supplements.  Hypertension Patient takes amlodipine 2.5mg  and losartan 100mg  daily with no missed doses Denies chest pain, shortness of breath, headaches.   Hyperlipidemia Patient takes lipitor 10mg  daily with no missed doses. Denies myalgias.  Lab Results  Component Value Date   CHOL 175 09/07/2018   HDL 68 09/07/2018   LDLCALC 88 09/07/2018   TRIG 99 09/07/2018   CHOLHDL 2.6 09/07/2018     PHQ2/9: Depression screen PHQ 2/9 03/12/2019 03/11/2019 03/06/2019 03/05/2019 09/12/2018  Decreased Interest 0 0 0 0 0  Down, Depressed, Hopeless 0 0 0 1 0  PHQ - 2 Score 0 0 0 1 0  Altered sleeping 0 - 0 - 0  Tired, decreased energy 0 - 0 - 0  Change in appetite 0 - 0 - 0  Feeling bad or failure about yourself  0 - 0 - 0  Trouble concentrating 0 - 0 - 0  Moving slowly or fidgety/restless 0 - 0 - 0  Suicidal thoughts 0 - 0 - 0  PHQ-9 Score 0 - 0 - 0  Difficult doing work/chores Not difficult at all - Not difficult at all - Not difficult at all     PHQ reviewed. Negative  Patient Active Problem List   Diagnosis Date Noted  . Cellulitis 02/23/2019  . History of basal cell carcinoma (BCC) 09/12/2018  . Hyperparathyroidism,  secondary renal ([place]) 07/11/2018  . Primary biliary cholangitis ([place]) 03/20/2017  . Overweight (BMI 25.0-29.9) 11/29/2015  . Carotid artery disease ([place]) 10/09/2015  . Medication monitoring encounter 10/09/2015  . Nail abnormalities 10/09/2015  . Arthralgia of multiple joints 10/09/2015  . Essential hypertension, benign 06/19/2015  . 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 Hyperlipidemia   . Hypothyroidism   . Gout   . CKD (chronic kidney disease) stage 3, GFR 30-59 ml/min (HCC)   . Hiatal hernia 06/26/2014    Past Medical History:  Diagnosis Date  . Biliary cirrhosis ([place])   . Cancer of skin of leg   . Cellulitis   . CKD (chronic kidney disease) stage 3, GFR 30-59 ml/min (HCC)   . Gout   . Hiatal hernia Oct 2015   8.5cm  . History of basal cell carcinoma (BCC) 09/12/2018   Oct 2016; Moh's surgery; right antitragus  . History of diverticulitis 2000  . Hyperlipidemia   . Hyperparathyroidism, secondary renal ([place]) 07/11/2018   Managed by nephrologist  . Hypertension   . Hypothyroidism   . Skin cancer of nose     Past Surgical History:  Procedure Laterality Date  . ABDOMINAL HYSTERECTOMY  1995  . CATARACT EXTRACTION    . FOOT SURGERY  2000  . HEMORRHOID SURGERY    .  THYROID SURGERY  1999   para thyroid    Social History   Tobacco Use  . Smoking status: Never Smoker  . Smokeless tobacco: Never Used  . Tobacco comment: smoking cessation materials not required  Substance Use Topics  . Alcohol use: No     Current Outpatient Medications:  .  acetaminophen (TYLENOL) 325 MG tablet, Take 650 mg by mouth 2 (two) times daily., Disp: , Rfl:  .  amLODipine (NORVASC) 2.5 MG tablet, Take 1 tablet (2.5 mg total) by mouth daily., Disp: 90 tablet, Rfl: 3 .  Ascorbic Acid (VITAMIN C) 1000 MG tablet, Take 1,000 mg by mouth 2 (two) times a day., Disp: , Rfl:  .  atorvastatin (LIPITOR) 10 MG tablet, Take 1 tablet (10 mg total) by mouth at bedtime.  (do not take for 3 days after taking colchicine), Disp: 90 tablet, Rfl: 1 .  BIOTIN PO, Take 250 mg by mouth 2 (two) times daily. , Disp: , Rfl:  .  Calcium-Magnesium-Vitamin D (CALCIUM 1200+D3 PO), Take 1 capsule by mouth daily., Disp: , Rfl:  .  Cholecalciferol (VITAMIN D-3) 25 MCG (1000 UT) CAPS, Take 1 capsule by mouth 2 (two) times a day., Disp: , Rfl:  .  COLACE 100 MG capsule, Take 100 mg by mouth daily. , Disp: , Rfl:  .  doxycycline (VIBRA-TABS) 100 MG tablet, Take 1 tablet (100 mg total) by mouth 2 (two) times daily., Disp: 20 tablet, Rfl: 0 .  levothyroxine (SYNTHROID, LEVOTHROID) 100 MCG tablet, Take 1 tablet (100 mcg total) by mouth daily., Disp: 90 tablet, Rfl: 3 .  losartan (COZAAR) 100 MG tablet, Take 1 tablet by mouth daily (Avoid salt substitutes, no fruit smoothies.), Disp: 90 tablet, Rfl: 1 .  niacinamide 500 MG tablet, Take 1 tablet (500 mg total) by mouth 2 (two) times daily with a meal., Disp: 180 tablet, Rfl: 1 .  OVER THE COUNTER MEDICATION, Take 1 capsule by mouth daily. CBD oil (Active Gold Label), Disp: , Rfl:  .  Turmeric 450 MG CAPS, Take 500 mg by mouth daily. , Disp: , Rfl:  .  ursodiol (ACTIGALL) 300 MG capsule, Take 300 mg by mouth daily. , Disp: , Rfl:  .  vitamin B-12 (CYANOCOBALAMIN) 250 MCG tablet, Take 250 mcg by mouth daily., Disp: , Rfl:   Allergies  Allergen Reactions  . Indomethacin Hives  . Ace Inhibitors Swelling    Other reaction(s): SWELLING Facial swelling. Facial swelling.  . Augmentin [Amoxicillin-Pot Clavulanate] Other (See Comments)    Other reaction(s): Unknown  . Penicillins Nausea And Vomiting    Has patient had a PCN reaction causing immediate rash, facial/tongue/throat swelling, SOB or lightheadedness with hypotension: Yes Has patient had a PCN reaction causing severe rash involving mucus membranes or skin necrosis: No Has patient had a PCN reaction that required hospitalization: No Has patient had a PCN reaction occurring within the  last 10 years: No If all of the above answers are "NO", then may proceed with Cephalosporin use.  . Sulfur Other (See Comments)    ROS   No other specific complaints in a complete review of systems (except as listed in HPI above).  Objective  Vitals:   03/12/19 1338  BP: 132/78  Pulse: 84  Resp: 14  Temp: 97.7 F (36.5 C)  TempSrc: Oral  SpO2: 96%  Weight: 153 lb 12.8 oz (69.8 kg)  Height: 5\' 4"  (1.626 m)    Body mass index is 26.4 kg/m.  Nursing Note and Vital  Signs reviewed.  Physical Exam Constitutional:      Appearance: Normal appearance. She is well-developed.  HENT:     Head: Normocephalic and atraumatic.     Right Ear: Hearing normal.     Left Ear: Hearing normal.  Eyes:     Conjunctiva/sclera: Conjunctivae normal.  Cardiovascular:     Rate and Rhythm: Normal rate.     Pulses: Normal pulses.  Pulmonary:     Effort: Pulmonary effort is normal.  Musculoskeletal: Normal range of motion.     Right ankle: She exhibits swelling (mild swelling, much improved,. redness significantly improved).  Neurological:     Mental Status: She is alert and oriented to person, place, and time.  Psychiatric:        Speech: Speech normal.        Behavior: Behavior normal. Behavior is cooperative.        Thought Content: Thought content normal.        Judgment: Judgment normal.       No results found for this or any previous visit (from the past 48 hour(s)).  Assessment & Plan  1. Cellulitis, unspecified cellulitis site Improving continue antibiotics return if not resolved or worsening.   2. Essential hypertension, benign Well controlled  - losartan (COZAAR) 100 MG tablet; Take 1 tablet by mouth daily (Avoid salt substitutes, no fruit smoothies.)  Dispense: 90 tablet; Refill: 1 - amLODipine (NORVASC) 2.5 MG tablet; Take 1 tablet (2.5 mg total) by mouth daily.  Dispense: 90 tablet; Refill: 1  3. Hypomagnesemia Discussed increasing in diet  4. Mixed  hyperlipidemia - atorvastatin (LIPITOR) 10 MG tablet; Take 1 tablet (10 mg total) by mouth at bedtime. (do not take for 3 days after taking colchicine)  Dispense: 90 tablet; Refill: 1

## 2019-03-12 NOTE — Telephone Encounter (Signed)
Copied from [place] [id number]. Topic: General - Inquiry >> Mar 08, 2019 10:34 AM Virl Axe D wrote: Reason for CRM: Pt's daughter Levada Dy has questions regarding pt's furosemide (LASIX) 20 MG tablet before she gives it to her and about Magnesium Oxide 250 MG TABS and if they can be purchased over the counter vs a rx. CB#[phone]

## 2019-03-13 ENCOUNTER — Ambulatory Visit: Payer: PPO | Admitting: Nurse Practitioner

## 2019-03-25 ENCOUNTER — Other Ambulatory Visit (HOSPITAL_COMMUNITY): Payer: Self-pay | Admitting: Nurse Practitioner

## 2019-03-25 ENCOUNTER — Other Ambulatory Visit: Payer: Self-pay | Admitting: Nurse Practitioner

## 2019-03-25 DIAGNOSIS — K743 Primary biliary cirrhosis: Secondary | ICD-10-CM

## 2019-03-26 DIAGNOSIS — M25571 Pain in right ankle and joints of right foot: Secondary | ICD-10-CM | POA: Diagnosis not present

## 2019-04-01 ENCOUNTER — Other Ambulatory Visit: Payer: Self-pay

## 2019-04-01 ENCOUNTER — Ambulatory Visit (INDEPENDENT_AMBULATORY_CARE_PROVIDER_SITE_OTHER): Payer: PPO

## 2019-04-01 DIAGNOSIS — L0390 Cellulitis, unspecified: Secondary | ICD-10-CM

## 2019-04-01 DIAGNOSIS — I10 Essential (primary) hypertension: Secondary | ICD-10-CM | POA: Diagnosis not present

## 2019-04-01 NOTE — Chronic Care Management (AMB) (Signed)
  Chronic Care Management   Note  04/01/2019 Name: ANNEMARIE SEBREE MRN: [id number] DOB: 05/28/1932  Care Coordination: Successful telephone encounter to Ms. Dawnisha Al to follow up on her recovery from cat scratch fever. Ms. Blanchette is unable to speak at this time as she is waiting for someone to pick up a piece of furniture. She has returned home to her residence now that she has recovered. She request a return call later today.  Plan: will follow up with patient later today as requested    Kalise Fickett E. Rollene Rotunda, RN, BSN Nurse Care Coordinator [facility] / [facility] Care Management  [phone]

## 2019-04-01 NOTE — Chronic Care Management (AMB) (Signed)
Chronic Care Management   Follow Up Note   04/01/2019 Name: Caitlin Jenkins MRN: [id number] DOB: [birth year]  Referred by: Caitlin Courser, MD Reason for referral : Chronic Care Management (follow up cat scratch fever/chronic conditions)   Subjective: "I am doing really well I think"   Objective:  BP Readings from Last 3 Encounters:  03/12/19 132/78  03/06/19 122/68  03/05/19 140/80    Assessment: Caitlin Jenkins a 85year old femalewho Caitlin Jenkins, MDfor primary care. [name]'s health planasked the CCM team to consult the patient forcare coordination and chronic case management.Patient's last office visit was 03/12/2019 for hospital follow up post discharge for cat scratch fever/sepsis. Today, RN CM follow up with patient to discuss progression towards previously established health goals.  Review of patient status, including review of consultants reports, relevant laboratory and other test results, and collaboration with appropriate care team members and the patient's provider was performed as part of comprehensive patient evaluation and provision of chronic care management services.    Goals Addressed            This Visit's Progress    I think I am doing very well now managing my health (pt-stated)       [name] says she is doing very well. She has moved out of her daughters home now that her post hospitalization health has improved. She is able to complete all her ADLs, and IADLs independently. She is taking her medications as prescribed. She continues to follow up with her providers as scheduled. She admits to much improved redness and inflammation to ankle and let. The cat has been removed from her home. This saddens her but it was a necessary as this cat continuously was temperamental and would scratch. [name] is aware of symptoms that would indicate worsening of her cellulitis and will report to PCP as needed. [name] does not  feel she needs ongoing monitoring by RN CM as no additional needs are identified however it is agreed that RN CM will follow for 30 more days.   Current Barriers:   Hearing loss  Chronic Disease Management Support  Nurse Case Manager Clinical Goal(s):   Over the next 60 days, patient will not experience hospital admission. Hospital Admissions in last 6 months = 1  Over the next 21 days, patient will attend all scheduled medical appointments: 6/16 at 1:40 with Caitlin Jenkins met 04/01/2019  Over the next 30 days, the patient will demonstrate ongoing self health care management ability as evidenced by taking medications as prescribed, attending all medical appointments, remaining active, and calling CCM RN CM is additional needs arise  Interventions:   Assessed for improvement in cellulitis on ankle and legs  Assessed for additional educational needs/health management needs  Provided patient with contact information and encouraged to contact CCM RN Cm if additional needs arise.  Patient Self Care Activities:   Self administers medications as prescribed  Attends all scheduled provider appointments  Calls pharmacy for medication refills  Attends church or other social activities  Performs ADL's independently  Performs IADL's independently  Calls provider office for new concerns or questions  Please see past updates related to this goal by clicking on the "Past Updates" button in the selected goal          Telephone follow up appointment with care management team member scheduled for: 30 days    Caitlin Jenkins E. Caitlin Rotunda, RN, BSN Nurse Care Coordinator Caitlin Jenkins / [place] Management  [phone]

## 2019-04-01 NOTE — Patient Instructions (Signed)
Thank you allowing the Chronic Care Management Team to be a part of your care! It was a pleasure speaking with you today!  1. Continue to take all medications as prescribed. 2. Please keep an eye on your cellulitis and report worsening symptoms 3. Continue to be active. Daily activity will keep you healthy 4. Please call RN CM if any additional needs arise. I am here for you.  CCM (Chronic Care Management) Team   Trish Fountain RN, BSN Nurse Care Coordinator  [phone]  Ruben Reason PharmD  Clinical Pharmacist  [phone]   Elliot Gurney, LCSW Clinical Social Worker [phone]  Goals Addressed            This Visit's Progress   . I think I am doing very well now managing my health (pt-stated)       Current Barriers:  . Hearing loss . Chronic Disease Management Support  Nurse Case Manager Clinical Goal(s):  [name] Over the next 60 days, patient will not experience hospital admission. Hospital Admissions in last 6 months = 1 . Over the next 21 days, patient will attend all scheduled medical appointments: 6/16 at 1:40 with [place] met 04/01/2019 . Over the next 30 days, the patient will demonstrate ongoing self health care management ability as evidenced by taking medications as prescribed, attending all medical appointments, remaining active, and calling CCM RN CM is additional needs arise  Interventions:  . Assessed for improvement in cellulitis on ankle and legs . Assessed for additional educational needs/health management needs . Provided patient with contact information and encouraged to contact CCM RN Cm if additional needs arise.  Patient Self Care Activities:  . Self administers medications as prescribed . Attends all scheduled provider appointments . Calls pharmacy for medication refills . Attends church or other social activities . Performs ADL's independently . Performs IADL's independently . Calls provider office for new concerns or  questions  Please see past updates related to this goal by clicking on the "Past Updates" button in the selected goal         The patient verbalized understanding of instructions provided today and declined a print copy of patient instruction materials.   Telephone follow up appointment with care management team member scheduled for: 30 days  SYMPTOMS OF A STROKE   You have any symptoms of stroke. "BE FAST" is an easy way to remember the main warning signs: ? B - Balance. Signs are dizziness, sudden trouble walking, or loss of balance. ? E - Eyes. Signs are trouble seeing or a sudden change in how you see. ? F - Face. Signs are sudden weakness or loss of feeling of the face, or the face or eyelid drooping on one side. ? A - Arms. Signs are weakness or loss of feeling in an arm. This happens suddenly and usually on one side of the body. ? S - Speech. Signs are sudden trouble speaking, slurred speech, or trouble understanding what people say. ? T - Time. Time to call emergency services. Write down what time symptoms started.  You have other signs of stroke, such as: ? A sudden, very bad headache with no known cause. ? Feeling sick to your stomach (nausea). ? Throwing up (vomiting). ? Jerky movements you cannot control (seizure).  SYMPTOMS OF A HEART ATTACK  What are the signs or symptoms? Symptoms of this condition include:  Chest pain. It may feel like: ? Crushing or squeezing. ? Tightness, pressure, fullness, or heaviness.  Pain  in the arm, neck, jaw, back, or upper body.  Shortness of breath.  Heartburn.  Indigestion.  Nausea.  Cold sweats.  Feeling tired.  Sudden lightheadedness.

## 2019-05-03 ENCOUNTER — Ambulatory Visit: Payer: Self-pay

## 2019-05-03 ENCOUNTER — Telehealth: Payer: Self-pay

## 2019-05-03 DIAGNOSIS — L0390 Cellulitis, unspecified: Secondary | ICD-10-CM

## 2019-05-03 NOTE — Chronic Care Management (AMB) (Signed)
   Chronic Care Management   Unsuccessful Call Note 05/03/2019 Name: Caitlin Jenkins MRN: [id number] DOB: [birth year]  Caitlin Jenkins. [name] a 85year old femalewho Caitlin Jenkins, MDfor primary care. Caitlin Jenkins's health planasked the CCM team to consult the patient forcare coordination and chronic case management.Patient's last office visit was 03/12/2019 for hospital follow up post discharge for cat scratch fever/sepsis.   Was unable to reach patient via telephone today for follow up. I have left HIPAA compliant voicemail asking patient to return my call. (unsuccessful outreach #1).   Plan: Will follow-up within 7 business days via telephone.      Caitlin Jenkins E. Caitlin Rotunda, RN, BSN Nurse Care Coordinator [facility] / [facility] Care Management  [phone]

## 2019-05-06 ENCOUNTER — Ambulatory Visit (INDEPENDENT_AMBULATORY_CARE_PROVIDER_SITE_OTHER): Payer: PPO

## 2019-05-06 ENCOUNTER — Other Ambulatory Visit: Payer: Self-pay

## 2019-05-06 DIAGNOSIS — E782 Mixed hyperlipidemia: Secondary | ICD-10-CM | POA: Diagnosis not present

## 2019-05-06 DIAGNOSIS — I10 Essential (primary) hypertension: Secondary | ICD-10-CM

## 2019-05-06 DIAGNOSIS — L0390 Cellulitis, unspecified: Secondary | ICD-10-CM

## 2019-05-06 DIAGNOSIS — K743 Primary biliary cirrhosis: Secondary | ICD-10-CM | POA: Diagnosis not present

## 2019-05-06 NOTE — Chronic Care Management (AMB) (Signed)
Chronic Care Management   Follow Up Note   05/06/2019 Name: Caitlin Jenkins MRN: [id number] DOB: 09-25-1932  Referred by: Arnetha Courser, MD Reason for referral : Chronic Care Management (follow up cat scratch fever)   Subjective: "I have had the most wonderful vacation on the [place]"  Objective:  BP Readings from Last 3 Encounters:  03/12/19 132/78  03/06/19 122/68  03/05/19 140/80   Lab Results  Component Value Date   CHOL 175 09/07/2018   HDL 68 09/07/2018   LDLCALC 88 09/07/2018   TRIG 99 09/07/2018   CHOLHDL 2.6 09/07/2018    Assessment: Caitlin Jenkins a 85year old femalewho seesMelinda Jenkins, MDfor primary care. Caitlin Jenkins's health planasked the CCM team to consult the patient forcare coordination and chronic case management.Patient's last office visit was 03/12/2019 for hospital follow up post discharge for cat scratch fever/sepsis.  Review of patient status, including review of consultants reports, relevant laboratory and other test results, and collaboration with appropriate care team members and the patient's provider was performed as part of comprehensive patient evaluation and provision of chronic care management services.    Goals Addressed            This Visit's Progress    I think I am doing very well now managing my health (pt-stated)       Caitlin Jenkins states she is doing well. Recently returned from vacation on the [place]. Her "cat scratch" wound has healed very well and patient states there is no evidence of cellulitis. Does admit to slight welling when she is active and on her feet a lot. She remains social, keeping with covid 19 infection prevention practices. She is taking her medications as prescribed and adhering to all provider appointments. States she is scheduled for in home appointment with Caitlin Jenkins later this month.  Current Barriers:   Hearing loss  Chronic Disease Management Support  Nurse Case Manager  Clinical Goal(s):   Over the next 60 days, patient will not experience hospital admission. Hospital Admissions in last 6 months = 1  Over the next 30 days, the patient will demonstrate ongoing self health care management ability as evidenced by taking medications as prescribed, attending all medical appointments, remaining active, and calling CCM RN CM is additional needs arise  Interventions:   Assessed for improvement in cellulitis on ankle and legs  Assessed for additional educational needs/health management needs  Provided patient with contact information and encouraged to contact CCM RN Cm if additional needs arise.  Discussed and encouraged engagement with Caitlin Jenkins  Patient Self Care Activities:   Self administers medications as prescribed  Attends all scheduled provider appointments  Calls pharmacy for medication refills  Attends church or other social activities  Performs ADL's independently  Performs IADL's independently  Calls provider office for new concerns or questions  Please see past updates related to this goal by clicking on the "Past Updates" button in the selected goal          Caitlin Jenkins has met all care management goals. Review of patient status, including review of consultants reports, relevant laboratory and other test results, and collaboration with appropriate care team members and the patient's provider was performed as part of comprehensive patient evaluation and provision of chronic care management services.  The care management team is available to Caitlin Jenkins at any time to assist with care management needs should they arise. Caitlin Jenkins has been given contact information  and instructions to contact the care management team with any questions or should new care management needs arise.      Caitlin Hirst E. Rollene Rotunda, RN, BSN Nurse Care Coordinator [facility] / [facility] Care Management  [phone]

## 2019-05-06 NOTE — Patient Instructions (Signed)
Congratulations! You have met all case management goals! You may call the case management team at any time should you have a question or if you have new case management needs. We are happy to help you! We will let your doctor know that you have met your goals.    CCM (Chronic Care Management) Team   Trish Fountain RN, BSN Nurse Care Coordinator  [phone]  Ruben Reason PharmD  Clinical Pharmacist  [phone]   Elliot Gurney, LCSW Clinical Social Worker [phone]  Goals Addressed            This Visit's Progress   . I think I am doing very well now managing my health (pt-stated)       Current Barriers:  . Hearing loss . Chronic Disease Management Support  Nurse Case Manager Clinical Goal(s):  [name] Over the next 60 days, patient will not experience hospital admission. Hospital Admissions in last 6 months = 1 . Over the next 30 days, the patient will demonstrate ongoing self health care management ability as evidenced by taking medications as prescribed, attending all medical appointments, remaining active, and calling CCM RN CM is additional needs arise  Interventions:  . Assessed for improvement in cellulitis on ankle and legs . Assessed for additional educational needs/health management needs . Provided patient with contact information and encouraged to contact CCM RN Cm if additional needs arise. . Discussed and encouraged engagement with [facility]  Patient Self Care Activities:  . Self administers medications as prescribed . Attends all scheduled provider appointments . Calls pharmacy for medication refills . Attends church or other social activities . Performs ADL's independently . Performs IADL's independently . Calls provider office for new concerns or questions  Please see past updates related to this goal by clicking on the "Past Updates" button in the selected goal         The patient verbalized understanding of instructions provided today  and declined a print copy of patient instruction materials.   The patient has been provided with contact information for the care management team and has been advised to call with any health related questions or concerns.   SYMPTOMS OF A STROKE   You have any symptoms of stroke. "BE FAST" is an easy way to remember the main warning signs: ? B - Balance. Signs are dizziness, sudden trouble walking, or loss of balance. ? E - Eyes. Signs are trouble seeing or a sudden change in how you see. ? F - Face. Signs are sudden weakness or loss of feeling of the face, or the face or eyelid drooping on one side. ? A - Arms. Signs are weakness or loss of feeling in an arm. This happens suddenly and usually on one side of the body. ? S - Speech. Signs are sudden trouble speaking, slurred speech, or trouble understanding what people say. ? T - Time. Time to call emergency services. Write down what time symptoms started.  You have other signs of stroke, such as: ? A sudden, very bad headache with no known cause. ? Feeling sick to your stomach (nausea). ? Throwing up (vomiting). ? Jerky movements you cannot control (seizure).  SYMPTOMS OF A HEART ATTACK  What are the signs or symptoms? Symptoms of this condition include:  Chest pain. It may feel like: ? Crushing or squeezing. ? Tightness, pressure, fullness, or heaviness.  Pain in the arm, neck, jaw, back, or upper body.  Shortness of breath.  Heartburn.  Indigestion.  Nausea.  Cold sweats.  Feeling tired.  Sudden lightheadedness.

## 2019-05-13 ENCOUNTER — Other Ambulatory Visit: Payer: Self-pay

## 2019-05-13 ENCOUNTER — Ambulatory Visit
Admission: RE | Admit: 2019-05-13 | Discharge: 2019-05-13 | Disposition: A | Discharge status: Home / Self Care | Payer: PPO | Source: Ambulatory Visit | Attending: Nurse Practitioner | Admitting: Nurse Practitioner

## 2019-05-13 DIAGNOSIS — N2889 Other specified disorders of kidney and ureter: Secondary | ICD-10-CM | POA: Diagnosis not present

## 2019-05-13 DIAGNOSIS — K743 Primary biliary cirrhosis: Secondary | ICD-10-CM | POA: Insufficient documentation

## 2019-06-05 ENCOUNTER — Ambulatory Visit (INDEPENDENT_AMBULATORY_CARE_PROVIDER_SITE_OTHER): Payer: PPO | Admitting: Family Medicine

## 2019-06-05 ENCOUNTER — Encounter: Payer: Self-pay | Admitting: Family Medicine

## 2019-06-05 ENCOUNTER — Other Ambulatory Visit: Payer: Self-pay

## 2019-06-05 VITALS — BP 122/82 | HR 88 | Temp 98.1°F | Resp 14 | Ht 64.0 in | Wt 154.9 lb

## 2019-06-05 DIAGNOSIS — Z5181 Encounter for therapeutic drug level monitoring: Secondary | ICD-10-CM

## 2019-06-05 DIAGNOSIS — E890 Postprocedural hypothyroidism: Secondary | ICD-10-CM | POA: Diagnosis not present

## 2019-06-05 DIAGNOSIS — Z23 Encounter for immunization: Secondary | ICD-10-CM

## 2019-06-05 DIAGNOSIS — N2581 Secondary hyperparathyroidism of renal origin: Secondary | ICD-10-CM

## 2019-06-05 DIAGNOSIS — M1A0610 Idiopathic chronic gout, right knee, without tophus (tophi): Secondary | ICD-10-CM | POA: Diagnosis not present

## 2019-06-05 DIAGNOSIS — I779 Disorder of arteries and arterioles, unspecified: Secondary | ICD-10-CM

## 2019-06-05 DIAGNOSIS — R5383 Other fatigue: Secondary | ICD-10-CM

## 2019-06-05 DIAGNOSIS — E782 Mixed hyperlipidemia: Secondary | ICD-10-CM

## 2019-06-05 DIAGNOSIS — N183 Chronic kidney disease, stage 3 unspecified: Secondary | ICD-10-CM

## 2019-06-05 DIAGNOSIS — I739 Peripheral vascular disease, unspecified: Secondary | ICD-10-CM | POA: Diagnosis not present

## 2019-06-05 DIAGNOSIS — I10 Essential (primary) hypertension: Secondary | ICD-10-CM | POA: Diagnosis not present

## 2019-06-05 NOTE — Progress Notes (Signed)
Name: Caitlin Jenkins   MRN: [id number]    DOB: 1931/12/08   Date:06/05/2019       Progress Note  Chief Complaint  Patient presents with  . Follow-up  . Hypertension  . Hypothyroidism  . Hyperlipidemia     Subjective:   Caitlin Jenkins is a 85 y.o. female, presents to clinic for routine follow up on the conditions listed above.  Hyperlipidemia:  Current Medication Regimen:  lipitor 10 mg Last Lipids: Lab Results  Component Value Date   CHOL 175 09/07/2018   HDL 68 09/07/2018   LDLCALC 88 09/07/2018   TRIG 99 09/07/2018   CHOLHDL 2.6 09/07/2018   - Current Diet:  Eats generally healthy, but also eats what she wants cause she's 85 y/o and she laughs that she can do what she wants - Denies: Chest pain, shortness of breath, myalgias. - Documented aortic atherosclerosis? No  - but has carotid atherosclerosis - Risk factors for atherosclerosis: cerebral vascular disease, diabetes mellitus, hypercholesterolemia, hypertension and age  Hypertension:  Pt diagnosed with HTN 20+ years ago Currently managed on ;pdstysm 100 mg andnorvasc 2.5 mg Pt reports excellent med compliance and denies any SE.  No lightheadedness, hypotension, syncope. Blood pressure today is well controlled. BP Readings from Last 3 Encounters:  06/05/19 122/82  03/12/19 132/78  03/06/19 122/68   Pt denies CP, SOB, exertional sx, LE edema, palpitation, Ha's, visual disturbances Dietary efforts for BP?  Overall healthy diet, stays active  CKD with HPT - will be seeing nephrology next month.  She avoid NSAIDs and is compliant with BP meds and its generally controlled.    Hypothyroid - feels tired, no swelling, weight change, constipation, hair or skin changes She takes thyroid meds in the am with water will all other morning pills and supplements. Weight verified as fairly stable Wt Readings from Last 5 Encounters:  06/05/19 154 lb 14.4 oz (70.3 kg)  03/12/19 153 lb 12.8 oz (69.8 kg)  03/06/19 155 lb  4.8 oz (70.4 kg)  03/05/19 151 lb 3.2 oz (68.6 kg)  02/23/19 153 lb (69.4 kg)   BMI Readings from Last 5 Encounters:  06/05/19 26.59 kg/m  03/12/19 26.40 kg/m  03/06/19 26.66 kg/m  03/05/19 25.95 kg/m  02/23/19 26.26 kg/m   Patient denies any cold intolerance, paresthesias, weakness, fatigue, chest pain, shortness of breath.  She denies any pallor.  She is trying to go outside and get some sunlight and be active but she does want to just sit around all day and do nothing which is unlike her.  She has no exertional symptoms.  Her diet is still good she is eating and drinking normally having normal bowel movements and normal urination.  She has no concerns for any blood loss no bleeding, melena, hematochezia, does not bruise easily   Patient Active Problem List   Diagnosis Date Noted  . Cellulitis 02/23/2019  . History of basal cell carcinoma (BCC) 09/12/2018  . Hyperparathyroidism, secondary renal ([place]) 07/11/2018  . Primary biliary cholangitis ([place]) 03/20/2017  . Overweight (BMI 25.0-29.9) 11/29/2015  . Carotid artery disease ([place]) 10/09/2015  . Medication monitoring encounter 10/09/2015  . Nail abnormalities 10/09/2015  . Arthralgia of multiple joints 10/09/2015  . Essential hypertension, benign 06/19/2015  . 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 Hyperlipidemia   . Hypothyroidism   . Gout   . CKD (chronic kidney disease) stage 3, GFR 30-59 ml/min (HCC)   . Hiatal hernia 06/26/2014  Past Surgical History:  Procedure Laterality Date  . ABDOMINAL HYSTERECTOMY  1995  . CATARACT EXTRACTION    . FOOT SURGERY  2000  . HEMORRHOID SURGERY    . THYROID SURGERY  1999   para thyroid    Family History  Problem Relation Age of Onset  . Emphysema Father   . Asthma Father   . Hypertension Daughter   . Heart disease Daughter        3 stents  . Diabetes Daughter   . Hypertension Son   . Cancer Son        prostate  . Diabetes Son   .  Heart disease Daughter        heart attack, 2 stents  . Multiple sclerosis Daughter   . Diabetes Daughter   . Fibromyalgia Daughter   . Diabetes Daughter     Social History   Socioeconomic History  . Marital status: Widowed    Spouse name: Joe  . Number of children: 4  . Years of education: some college  . Highest education level: 12th grade  Occupational History  . Occupation: Retired  Scientific laboratory technician  . Financial resource strain: Not hard at all  . Food insecurity    Worry: Never true    Inability: Never true  . Transportation needs    Medical: No    Non-medical: No  Tobacco Use  . Smoking status: Never Smoker  . Smokeless tobacco: Never Used  . Tobacco comment: smoking cessation materials not required  Substance and Sexual Activity  . Alcohol use: No  . Drug use: No  . Sexual activity: Not Currently  Lifestyle  . Physical activity    Days per week: 0 days    Minutes per session: 0 min  . Stress: Not at all  Relationships  . Social connections    Talks on phone: More than three times a week    Gets together: Three times a week    Attends religious service: More than 4 times per year    Active member of club or organization: No    Attends meetings of clubs or organizations: Never    Relationship status: Widowed  . Intimate partner violence    Fear of current or ex partner: No    Emotionally abused: No    Physically abused: No    Forced sexual activity: No  Other Topics Concern  . Not on file  Social History Narrative  . Not on file     Current Outpatient Medications:  .  acetaminophen (TYLENOL) 325 MG tablet, Take 650 mg by mouth 2 (two) times daily., Disp: , Rfl:  .  amLODipine (NORVASC) 2.5 MG tablet, Take 1 tablet (2.5 mg total) by mouth daily., Disp: 90 tablet, Rfl: 1 .  Ascorbic Acid (VITAMIN C) 1000 MG tablet, Take 1,000 mg by mouth 2 (two) times a day., Disp: , Rfl:  .  atorvastatin (LIPITOR) 10 MG tablet, Take 1 tablet (10 mg total) by mouth at  bedtime. (do not take for 3 days after taking colchicine), Disp: 90 tablet, Rfl: 1 .  Calcium-Magnesium-Vitamin D (CALCIUM 1200+D3 PO), Take 1 capsule by mouth daily., Disp: , Rfl:  .  Cholecalciferol (VITAMIN D-3) 25 MCG (1000 UT) CAPS, Take 1 capsule by mouth 2 (two) times a day., Disp: , Rfl:  .  COLACE 100 MG capsule, Take 100 mg by mouth daily. , Disp: , Rfl:  .  levothyroxine (SYNTHROID, LEVOTHROID) 100 MCG tablet, Take 1 tablet (100 mcg  total) by mouth daily., Disp: 90 tablet, Rfl: 3 .  losartan (COZAAR) 100 MG tablet, Take 1 tablet by mouth daily (Avoid salt substitutes, no fruit smoothies.), Disp: 90 tablet, Rfl: 1 .  niacinamide 500 MG tablet, Take 1 tablet (500 mg total) by mouth 2 (two) times daily with a meal., Disp: 180 tablet, Rfl: 1 .  OVER THE COUNTER MEDICATION, Take 1 capsule by mouth daily. CBD oil (Active Gold Label), Disp: , Rfl:  .  Turmeric 450 MG CAPS, Take 500 mg by mouth daily. , Disp: , Rfl:  .  ursodiol (ACTIGALL) 300 MG capsule, Take 300 mg by mouth daily. , Disp: , Rfl:  .  vitamin B-12 (CYANOCOBALAMIN) 250 MCG tablet, Take 250 mcg by mouth daily., Disp: , Rfl:  .  BIOTIN PO, Take 250 mg by mouth 2 (two) times daily. , Disp: , Rfl:  .  doxycycline (VIBRA-TABS) 100 MG tablet, Take 1 tablet (100 mg total) by mouth 2 (two) times daily. (Patient not taking: Reported on 06/05/2019), Disp: 20 tablet, Rfl: 0  Allergies  Allergen Reactions  . Indomethacin Hives  . Ace Inhibitors Swelling    Other reaction(s): SWELLING Facial swelling. Facial swelling.  . Augmentin [Amoxicillin-Pot Clavulanate] Other (See Comments)    Other reaction(s): Unknown  . Penicillins Nausea And Vomiting    Has patient had a PCN reaction causing immediate rash, facial/tongue/throat swelling, SOB or lightheadedness with hypotension: Yes Has patient had a PCN reaction causing severe rash involving mucus membranes or skin necrosis: No Has patient had a PCN reaction that required hospitalization: No  Has patient had a PCN reaction occurring within the last 10 years: No If all of the above answers are "NO", then may proceed with Cephalosporin use.  [name] Sulfur Other (See Comments)    I personally reviewed active problem list, medication list, allergies, family history, social history, notes from last encounter, lab results with the patient/caregiver today.  Review of Systems  Constitutional: Negative.   HENT: Negative.   Eyes: Negative.   Respiratory: Negative.   Cardiovascular: Negative.   Gastrointestinal: Negative.   Endocrine: Negative.   Genitourinary: Negative.   Musculoskeletal: Negative.   Skin: Negative.   Allergic/Immunologic: Negative.   Neurological: Negative.   Hematological: Negative.   Psychiatric/Behavioral: Negative.   All other systems reviewed and are negative.    Objective:    Vitals:   06/05/19 1019  BP: 122/82  Pulse: 88  Resp: 14  Temp: 98.1 F (36.7 C)  SpO2: 97%  Weight: 154 lb 14.4 oz (70.3 kg)  Height: 5\' 4"  (1.626 m)    Body mass index is 26.59 kg/m.  Physical Exam Constitutional:      General: She is not in acute distress.    Appearance: Normal appearance. She is well-developed. She is not toxic-appearing or diaphoretic.  HENT:     Head: Normocephalic and atraumatic.     Right Ear: External ear normal.     Left Ear: External ear normal.     Nose: Nose normal.     Mouth/Throat:     Pharynx: Uvula midline.  Eyes:     General: Lids are normal.     Conjunctiva/sclera: Conjunctivae normal.     Pupils: Pupils are equal, round, and reactive to light.  Neck:     Musculoskeletal: Normal range of motion and neck supple.     Trachea: Phonation normal. No tracheal deviation.  Cardiovascular:     Rate and Rhythm: Normal rate and regular rhythm.  Pulses: Normal pulses.          Radial pulses are 2+ on the right side and 2+ on the left side.       Posterior tibial pulses are 2+ on the right side and 2+ on the left side.     Heart  sounds: Normal heart sounds. No murmur. No friction rub. No gallop.   Pulmonary:     Effort: Pulmonary effort is normal. No respiratory distress.     Breath sounds: Normal breath sounds. No stridor. No wheezing, rhonchi or rales.  Chest:     Chest wall: No tenderness.  Abdominal:     General: Bowel sounds are normal. There is no distension.     Palpations: Abdomen is soft.     Tenderness: There is no abdominal tenderness. There is no guarding or rebound.  Musculoskeletal: Normal range of motion.        General: No deformity.  Lymphadenopathy:     Cervical: No cervical adenopathy.  Skin:    General: Skin is warm and dry.     Capillary Refill: Capillary refill takes less than 2 seconds.     Coloration: Skin is not pale.     Findings: No rash.  Neurological:     Mental Status: She is alert and oriented to person, place, and time.     Motor: No abnormal muscle tone.     Gait: Gait normal.  Psychiatric:        Speech: Speech normal.        Behavior: Behavior normal.       PHQ2/9: Depression screen [facility] 2/9 06/05/2019 03/12/2019 03/11/2019 03/06/2019 03/05/2019  Decreased Interest 0 0 0 0 0  Down, Depressed, Hopeless 0 0 0 0 1  PHQ - 2 Score 0 0 0 0 1  Altered sleeping 0 0 - 0 -  Tired, decreased energy 0 0 - 0 -  Change in appetite 0 0 - 0 -  Feeling bad or failure about yourself  0 0 - 0 -  Trouble concentrating 0 0 - 0 -  Moving slowly or fidgety/restless 0 0 - 0 -  Suicidal thoughts 0 0 - 0 -  PHQ-9 Score 0 0 - 0 -  Difficult doing work/chores Not difficult at all Not difficult at all - Not difficult at all -    phq 9 is negative  Fall Risk: Fall Risk  06/05/2019 03/12/2019 03/11/2019 03/06/2019 03/05/2019  Falls in the past year? 0 0 0 0 0  Number falls in past yr: 0 0 - - 0  Injury with Fall? 0 0 - - 0  Risk for fall due to : - - - - -  Risk for fall due to: Comment - - - - -  Follow up - - - - Falls prevention discussed      Functional Status Survey: Is the patient deaf  or have difficulty hearing?: Yes Does the patient have difficulty seeing, even when wearing glasses/contacts?: No Does the patient have difficulty concentrating, remembering, or making decisions?: No Does the patient have difficulty walking or climbing stairs?: No Does the patient have difficulty dressing or bathing?: No Does the patient have difficulty doing errands alone such as visiting a doctor's office or shopping?: No    Assessment & Plan:   Problem List Items Addressed This Visit      Cardiovascular and Mediastinum   Essential hypertension, benign (Chronic)    Well controlled, recheck labs      Relevant  Orders   COMPLETE METABOLIC PANEL WITH GFR   Carotid artery disease (HCC)     Endocrine   Hypothyroidism (Chronic)    Fatigue for the past couple months after hospitalization for sepsis secondary to cellulitis, doesn't feel back to her normal Recheck TSH      Relevant Orders   TSH   Hyperparathyroidism, secondary renal ([place])    Seeing nephrology next month- needs recheck and renal function and uric acid        Genitourinary   CKD (chronic kidney disease) stage 3, GFR 30-59 ml/min (HCC) (Chronic)    Seeing nephrology in the next month Recheck labs and will send copy to pt to take with her to her upcoming appt      Relevant Orders   COMPLETE METABOLIC PANEL WITH GFR     Other   Hyperlipidemia (Chronic)    Tolerating meds, no SE, recheck FLP and CMP      Relevant Orders   Lipid panel   COMPLETE METABOLIC PANEL WITH GFR   Gout (Chronic)    No recent flare, recheck CMP and uric acid      Relevant Orders   COMPLETE METABOLIC PANEL WITH GFR   Uric acid   Medication monitoring encounter   Relevant Orders   CBC with Differential/Platelet    Other Visit Diagnoses    Need for influenza vaccination    -  Primary   Relevant Orders   Flu Vaccine QUAD High Dose(Fluad) (Completed)   Hypomagnesemia       last labs show low magnesium, recheck today   Relevant  Orders   Magnesium   Fatigue, unspecified type       r/o chemical hypothyroid, electrolyte imbalance, anemia, may be normal recovery from her recent hospitalization   Relevant Orders   TSH   CBC with Differential/Platelet       Return in about 4 months (around 10/05/2019) for Routine follow-up.   Delsa Grana, PA-C 06/05/19 10:39 AM

## 2019-06-05 NOTE — Assessment & Plan Note (Signed)
Seeing nephrology in the next month Recheck labs and will send copy to pt to take with her to her upcoming appt

## 2019-06-05 NOTE — Assessment & Plan Note (Signed)
Well controlled, recheck labs

## 2019-06-05 NOTE — Assessment & Plan Note (Signed)
Fatigue for the past couple months after hospitalization for sepsis secondary to cellulitis, doesn't feel back to her normal Recheck TSH

## 2019-06-05 NOTE — Patient Instructions (Addendum)
Make sure you are taking your levothyroxine 100 mcg first thing in morning on an empty stomach and try to wait about one hour before eating and before taking all your other morning medication and supplements.   We will call and mail you lab results.

## 2019-06-05 NOTE — Assessment & Plan Note (Signed)
No recent flare, recheck CMP and uric acid

## 2019-06-05 NOTE — Assessment & Plan Note (Signed)
Tolerating meds, no SE, recheck FLP and CMP

## 2019-06-05 NOTE — Assessment & Plan Note (Signed)
Seeing nephrology next month- needs recheck and renal function and uric acid

## 2019-06-06 LAB — CBC WITH DIFFERENTIAL/PLATELET
Absolute Monocytes: 581 cells/uL (ref 200–950)
Basophils Absolute: 42 cells/uL (ref 0–200)
Basophils Relative: 0.5 %
Eosinophils Absolute: 232 cells/uL (ref 15–500)
Eosinophils Relative: 2.8 %
HCT: 39.7 % (ref 35.0–45.0)
Hemoglobin: 13.3 g/dL (ref 11.7–15.5)
Lymphs Abs: 3204 cells/uL (ref 850–3900)
MCH: 30 pg (ref 27.0–33.0)
MCHC: 33.5 g/dL (ref 32.0–36.0)
MCV: 89.4 fL (ref 80.0–100.0)
MPV: 9.3 fL (ref 7.5–12.5)
Monocytes Relative: 7 %
Neutro Abs: 4241 cells/uL (ref 1500–7800)
Neutrophils Relative %: 51.1 %
Platelets: 261 10*3/uL (ref 140–400)
RBC: 4.44 10*6/uL (ref 3.80–5.10)
RDW: 12.7 % (ref 11.0–15.0)
Total Lymphocyte: 38.6 %
WBC: 8.3 10*3/uL (ref 3.8–10.8)

## 2019-06-06 LAB — COMPLETE METABOLIC PANEL WITH GFR
AG Ratio: 1.6 (calc) (ref 1.0–2.5)
ALT: 10 U/L (ref 6–29)
AST: 24 U/L (ref 10–35)
Albumin: 4.5 g/dL (ref 3.6–5.1)
Alkaline phosphatase (APISO): 90 U/L (ref 37–153)
BUN/Creatinine Ratio: 17 (calc) (ref 6–22)
BUN: 18 mg/dL (ref 7–25)
CO2: 20 mmol/L (ref 20–32)
Calcium: 10 mg/dL (ref 8.6–10.4)
Chloride: 104 mmol/L (ref 98–110)
Creat: 1.05 mg/dL — ABNORMAL HIGH (ref 0.60–0.88)
GFR, Est African American: 56 mL/min/{1.73_m2} — ABNORMAL LOW (ref >=60)
GFR, Est Non African American: 48 mL/min/{1.73_m2} — ABNORMAL LOW (ref >=60)
Globulin: 2.8 g/dL (calc) (ref 1.9–3.7)
Glucose, Bld: 91 mg/dL (ref 65–99)
Potassium: 4.8 mmol/L (ref 3.5–5.3)
Sodium: 137 mmol/L (ref 135–146)
Total Bilirubin: 0.5 mg/dL (ref 0.2–1.2)
Total Protein: 7.3 g/dL (ref 6.1–8.1)

## 2019-06-06 LAB — LIPID PANEL
Cholesterol: 162 mg/dL (ref <200)
HDL: 70 mg/dL (ref >=50)
LDL Cholesterol (Calc): 72 mg/dL (calc)
Non-HDL Cholesterol (Calc): 92 mg/dL (calc) (ref <130)
Total CHOL/HDL Ratio: 2.3 (calc) (ref <5.0)
Triglycerides: 118 mg/dL (ref <150)

## 2019-06-06 LAB — TSH: TSH: 0.11 mIU/L — ABNORMAL LOW (ref 0.40–4.50)

## 2019-06-06 LAB — MAGNESIUM: Magnesium: 1.3 mg/dL — ABNORMAL LOW (ref 1.5–2.5)

## 2019-06-06 LAB — URIC ACID: Uric Acid, Serum: 6 mg/dL (ref 2.5–7.0)

## 2019-07-03 DIAGNOSIS — I129 Hypertensive chronic kidney disease with stage 1 through stage 4 chronic kidney disease, or unspecified chronic kidney disease: Secondary | ICD-10-CM | POA: Diagnosis not present

## 2019-07-03 DIAGNOSIS — N2581 Secondary hyperparathyroidism of renal origin: Secondary | ICD-10-CM | POA: Diagnosis not present

## 2019-07-03 DIAGNOSIS — N1832 Chronic kidney disease, stage 3b: Secondary | ICD-10-CM | POA: Diagnosis not present

## 2019-07-03 DIAGNOSIS — M10379 Gout due to renal impairment, unspecified ankle and foot: Secondary | ICD-10-CM | POA: Diagnosis not present

## 2019-07-03 DIAGNOSIS — E871 Hypo-osmolality and hyponatremia: Secondary | ICD-10-CM | POA: Diagnosis not present

## 2019-07-03 HISTORY — DX: Hypomagnesemia: E83.42

## 2019-07-15 DIAGNOSIS — Z85828 Personal history of other malignant neoplasm of skin: Secondary | ICD-10-CM | POA: Diagnosis not present

## 2019-07-15 DIAGNOSIS — D2272 Melanocytic nevi of left lower limb, including hip: Secondary | ICD-10-CM | POA: Diagnosis not present

## 2019-07-15 DIAGNOSIS — D2261 Melanocytic nevi of right upper limb, including shoulder: Secondary | ICD-10-CM | POA: Diagnosis not present

## 2019-07-15 DIAGNOSIS — X32XXXA Exposure to sunlight, initial encounter: Secondary | ICD-10-CM | POA: Diagnosis not present

## 2019-07-15 DIAGNOSIS — D2262 Melanocytic nevi of left upper limb, including shoulder: Secondary | ICD-10-CM | POA: Diagnosis not present

## 2019-07-15 DIAGNOSIS — L821 Other seborrheic keratosis: Secondary | ICD-10-CM | POA: Diagnosis not present

## 2019-07-15 DIAGNOSIS — L570 Actinic keratosis: Secondary | ICD-10-CM | POA: Diagnosis not present

## 2019-07-23 ENCOUNTER — Encounter: Payer: Self-pay | Admitting: Family Medicine

## 2019-07-25 ENCOUNTER — Encounter: Payer: Self-pay | Admitting: Family Medicine

## 2019-07-25 DIAGNOSIS — K743 Primary biliary cirrhosis: Secondary | ICD-10-CM

## 2019-08-09 ENCOUNTER — Encounter: Payer: Self-pay | Admitting: Family Medicine

## 2019-08-13 DIAGNOSIS — Z7689 Persons encountering health services in other specified circumstances: Secondary | ICD-10-CM | POA: Diagnosis not present

## 2019-09-13 ENCOUNTER — Other Ambulatory Visit: Payer: Self-pay

## 2019-09-13 DIAGNOSIS — E782 Mixed hyperlipidemia: Secondary | ICD-10-CM

## 2019-09-13 MED ORDER — ATORVASTATIN CALCIUM 10 MG PO TABS: 10.0000 mg | ORAL_TABLET | Freq: Every day | ORAL | 1 refills | Status: DC

## 2019-10-05 ENCOUNTER — Encounter: Payer: Self-pay | Admitting: Family Medicine

## 2019-10-06 NOTE — Progress Notes (Signed)
Gastroenterology Consultation  Referring Provider:     Delsa Grana, PA-C Primary Care Physician:  Delsa Grana, PA-C Primary Gastroenterologist:  Dr. Allen Norris     Reason for Consultation:     Primary biliary cholangitis        HPI:   Caitlin Jenkins is a 86 y.o. y/o female referred for consultation & management of primary biliary cholangitis by Dr. Delsa Grana, PA-C.  This patient comes to me for transfer of care after being seen by Dr. Vira Jenkins and his nurse practitioner since 2015.  The patient has been diagnosed with PBC and treated with ursodeoxycholic acid.  The patient's last alpha-fetoprotein was in August of last year and was normal at 2.1.  Her liver enzymes have been consistently normal since 2016.  The patient also had a normal-appearing liver back in August 2020 on ultrasound. The patient reports that she only takes 1 300 mg tablet of ursodeoxycholic acid at the present time because she states it is very expensive.  The patient has had historically normal liver enzymes while taking that dose.  Past Medical History:  Diagnosis Date  . Biliary cirrhosis ([place])   . Cancer of skin of leg   . Cellulitis   . CKD (chronic kidney disease) stage 3, GFR 30-59 ml/min (HCC)   . Gout   . Hiatal hernia Oct 2015   8.5cm  . History of basal cell carcinoma (BCC) 09/12/2018   Oct 2016; Moh's surgery; right antitragus  . History of diverticulitis 2000  . Hyperlipidemia   . Hyperparathyroidism, secondary renal ([place]) 07/11/2018   Managed by nephrologist  . Hypertension   . Hypothyroidism   . Skin cancer of nose     Past Surgical History:  Procedure Laterality Date  . ABDOMINAL HYSTERECTOMY  1995  . CATARACT EXTRACTION    . FOOT SURGERY  2000  . HEMORRHOID SURGERY    . THYROID SURGERY  1999   para thyroid    Prior to Admission medications   Medication Sig Start Date End Date Taking? Authorizing Provider  acetaminophen (TYLENOL) 325 MG tablet Take 650 mg by mouth 2 (two) times  daily.    [provider]  amLODipine (NORVASC) 2.5 MG tablet Take 1 tablet (2.5 mg total) by mouth daily. 03/12/19   Poulose, Bethel Born, NP  Ascorbic Acid (VITAMIN C) 1000 MG tablet Take 1,000 mg by mouth 2 (two) times a day.    [provider]  atorvastatin (LIPITOR) 10 MG tablet Take 1 tablet (10 mg total) by mouth at bedtime. (do not take for 3 days after taking colchicine) 09/13/19   Delsa Grana, PA-C  BIOTIN PO Take 250 mg by mouth 2 (two) times daily.     [provider]  Calcium-Magnesium-Vitamin D (CALCIUM 1200+D3 PO) Take 1 capsule by mouth daily.    [provider]  Cholecalciferol (VITAMIN D-3) 25 MCG (1000 UT) CAPS Take 1 capsule by mouth 2 (two) times a day.    [provider]  COLACE 100 MG capsule Take 100 mg by mouth daily.  01/26/15   [provider]  levothyroxine (SYNTHROID, LEVOTHROID) 100 MCG tablet Take 1 tablet (100 mcg total) by mouth daily. 09/17/18   Arnetha Courser, MD  losartan (COZAAR) 100 MG tablet Take 1 tablet by mouth daily (Avoid salt substitutes, no fruit smoothies.) 03/12/19   Poulose, Bethel Born, NP  niacinamide 500 MG tablet Take 1 tablet (500 mg total) by mouth 2 (two) times daily with a meal.  11/13/17   Arnetha Courser, MD  OVER THE COUNTER MEDICATION Take 1 capsule by mouth daily. CBD oil (Active Gold Label)    [provider]  Turmeric 450 MG CAPS Take 500 mg by mouth daily.     [provider]  ursodiol (ACTIGALL) 300 MG capsule Take 300 mg by mouth daily.  12/16/14   [provider]  vitamin B-12 (CYANOCOBALAMIN) 250 MCG tablet Take 250 mcg by mouth daily.    [provider]    Family History  Problem Relation Age of Onset  . Emphysema Father   . Asthma Father   . Hypertension Daughter   . Heart disease Daughter        3 stents  . Diabetes Daughter   . Hypertension Son   . Cancer Son        prostate  . Diabetes Son   . Heart disease Daughter        heart  attack, 2 stents  . Multiple sclerosis Daughter   . Diabetes Daughter   . Fibromyalgia Daughter   . Diabetes Daughter      Social History   Tobacco Use  . Smoking status: Never Smoker  . Smokeless tobacco: Never Used  . Tobacco comment: smoking cessation materials not required  Substance Use Topics  . Alcohol use: No  . Drug use: No    Allergies as of 10/07/2019 - Review Complete 06/05/2019  Allergen Reaction Noted  . Indomethacin Hives 10/14/2014  . Ace inhibitors Swelling 09/25/2014  . Augmentin [amoxicillin-pot clavulanate] Other (See Comments) 03/12/2015  . Penicillins Nausea And Vomiting 03/12/2015  . Sulfur Other (See Comments) 03/13/2015    Review of Systems:    All systems reviewed and negative except where noted in HPI.   Physical Exam:  There were no vitals taken for this visit. No LMP recorded. Patient has had a hysterectomy. General:   Alert,  Well-developed, well-nourished, pleasant and cooperative in NAD Head:  Normocephalic and atraumatic. Eyes:  Sclera clear, no icterus.   Conjunctiva pink. Ears:  Normal auditory acuity. Neck:  Supple; no masses or thyromegaly. Lungs:  Respirations even and unlabored.  Clear throughout to auscultation.   No wheezes, crackles, or rhonchi. No acute distress. Heart:  Regular rate and rhythm; no murmurs, clicks, rubs, or gallops. Abdomen:  Normal bowel sounds.  No bruits.  Soft, non-tender and non-distended without masses, hepatosplenomegaly or hernias noted.  No guarding or rebound tenderness.  Negative Carnett sign.   Rectal:  Deferred.  Pulses:  Normal pulses noted. Extremities:  No clubbing or edema.  No cyanosis. Neurologic:  Alert and oriented x3;  grossly normal neurologically. Skin:  Intact without significant lesions or rashes.  No jaundice. Lymph Nodes:  No significant cervical adenopathy. Psych:  Alert and cooperative. Normal mood and affect.  Imaging Studies: No results found.  Assessment and Plan:   Caitlin Jenkins is a 86 y.o. y/o female who comes in today for transfer of care after her gastroenterologist, Dr. Vira Jenkins, retired.  The patient has been doing well on Actigall and states that she is only taking 300 mg a day.  The patient has been told to continue this dose since it is working and we will check her liver enzymes today and alpha-fetoprotein .  She would like to space out her ultrasounds for yearly.  Since her last ultrasound did not show any sign of cirrhosis and she is 86 years old I have told her that that would be  fine.    Lucilla Lame, MD. [facility] Regal    Note: This dictation was prepared with Dragon dictation along with smaller phrase technology. Any transcriptional errors that result from this process are unintentional.

## 2019-10-07 ENCOUNTER — Encounter: Payer: Self-pay | Admitting: Gastroenterology

## 2019-10-07 ENCOUNTER — Ambulatory Visit (INDEPENDENT_AMBULATORY_CARE_PROVIDER_SITE_OTHER): Payer: PPO | Admitting: Gastroenterology

## 2019-10-07 ENCOUNTER — Other Ambulatory Visit: Payer: Self-pay

## 2019-10-07 VITALS — BP 148/85 | HR 82 | Temp 97.7°F | Ht 64.0 in | Wt 161.4 lb

## 2019-10-07 DIAGNOSIS — K743 Primary biliary cirrhosis: Secondary | ICD-10-CM | POA: Diagnosis not present

## 2019-10-07 DIAGNOSIS — I10 Essential (primary) hypertension: Secondary | ICD-10-CM

## 2019-10-07 MED ORDER — LOSARTAN POTASSIUM 100 MG PO TABS: ORAL_TABLET | ORAL | 1 refills | Status: DC

## 2019-10-08 ENCOUNTER — Ambulatory Visit (INDEPENDENT_AMBULATORY_CARE_PROVIDER_SITE_OTHER): Payer: PPO | Admitting: Family Medicine

## 2019-10-08 ENCOUNTER — Encounter: Payer: Self-pay | Admitting: Family Medicine

## 2019-10-08 ENCOUNTER — Other Ambulatory Visit: Payer: Self-pay

## 2019-10-08 VITALS — BP 120/80 | HR 90 | Temp 97.1°F | Resp 14 | Ht 64.0 in | Wt 160.2 lb

## 2019-10-08 DIAGNOSIS — N183 Chronic kidney disease, stage 3 unspecified: Secondary | ICD-10-CM | POA: Diagnosis not present

## 2019-10-08 DIAGNOSIS — M1A0610 Idiopathic chronic gout, right knee, without tophus (tophi): Secondary | ICD-10-CM | POA: Diagnosis not present

## 2019-10-08 DIAGNOSIS — E890 Postprocedural hypothyroidism: Secondary | ICD-10-CM | POA: Diagnosis not present

## 2019-10-08 DIAGNOSIS — I10 Essential (primary) hypertension: Secondary | ICD-10-CM

## 2019-10-08 DIAGNOSIS — E782 Mixed hyperlipidemia: Secondary | ICD-10-CM

## 2019-10-08 LAB — HEPATIC FUNCTION PANEL
ALT: 11 IU/L (ref 0–32)
AST: 25 IU/L (ref 0–40)
Albumin: 4.2 g/dL (ref 3.6–4.6)
Alkaline Phosphatase: 114 IU/L (ref 39–117)
Bilirubin Total: 0.4 mg/dL (ref 0.0–1.2)
Bilirubin, Direct: 0.14 mg/dL (ref 0.00–0.40)
Total Protein: 7.3 g/dL (ref 6.0–8.5)

## 2019-10-08 LAB — AFP TUMOR MARKER: AFP, Serum, Tumor Marker: 2.2 ng/mL (ref 0.0–8.3)

## 2019-10-08 MED ORDER — AMLODIPINE BESYLATE 2.5 MG PO TABS: 2.5000 mg | ORAL_TABLET | Freq: Every day | ORAL | 1 refills | Status: DC

## 2019-10-08 NOTE — Progress Notes (Signed)
Name: KAITLAN Jenkins   MRN: [id number]    DOB: 02-15-1932   Date:10/08/2019       Progress Note  Chief Complaint  Patient presents with  . Hypertension  . Hyperlipidemia  . Follow-up    discuss levothyroxine and magnesium     Subjective:   Caitlin Jenkins is a 86 y.o. female, presents to clinic for routine follow up on the conditions listed above.  Thyroid - taking 100 mcg 5 d/week and 50 mcg 2 d per week on Tuesday and Thursday, last tsh was low 0.11 about 4 months ago, she reports feeling better on the days that she takes the lower levothyroxine dose and wants to know if she can decrease her dose but she still has a lot of pills left over  Had low mag in Sept 2020 as well and was put on magnesium supplement twice a day she wonders if she can decrease this or stop it because the pills are very large.  She has not had any diarrhea  Hypertension:  Currently managed on Norvasc 2.5 mg and losartan 100 mg Pt reports good med compliance and denies any SE.  No lightheadedness, hypotension, syncope. Blood pressure today is well controlled. BP Readings from Last 3 Encounters:  10/08/19 120/80  10/07/19 (!) 148/85  06/05/19 122/82   Pt denies CP, SOB, exertional sx, LE edema, palpitation, Ha's, visual disturbances Dietary efforts for BP?  Healthy diet  Hyperlipidemia:  Current Medication Regimen: Lipitor 10 mg daily Last Lipids: Lab Results  Component Value Date   CHOL 162 06/05/2019   HDL 70 06/05/2019   LDLCALC 72 06/05/2019   TRIG 118 06/05/2019   CHOLHDL 2.3 06/05/2019   - Current Diet: Generally healthy - Denies: Chest pain, shortness of breath, myalgias. - Documented aortic atherosclerosis? No - Risk factors for atherosclerosis: hypercholesterolemia and hypertension  Patient Active Problem List   Diagnosis Date Noted  . Benign hypertensive kidney disease with chronic kidney disease 07/03/2019  . Hypomagnesemia 07/03/2019  . Hyposmolality and/or hyponatremia  07/03/2019  . Cellulitis 02/23/2019  . History of basal cell carcinoma (BCC) 09/12/2018  . Hyperparathyroidism, secondary renal ([place]) 07/11/2018  . Primary biliary cholangitis ([place]) 03/20/2017  . Overweight (BMI 25.0-29.9) 11/29/2015  . Carotid artery disease ([place]) 10/09/2015  . Medication monitoring encounter 10/09/2015  . Nail abnormalities 10/09/2015  . Arthralgia of multiple joints 10/09/2015  . Essential hypertension, benign 06/19/2015  . 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 Hyperlipidemia   . Hypothyroidism   . Gout   . CKD (chronic kidney disease) stage 3, GFR 30-59 ml/min (HCC)   . Hiatal hernia 06/26/2014    Past Surgical History:  Procedure Laterality Date  . ABDOMINAL HYSTERECTOMY  1995  . CATARACT EXTRACTION    . FOOT SURGERY  2000  . HEMORRHOID SURGERY    . THYROID SURGERY  1999   para thyroid    Family History  Problem Relation Age of Onset  . Emphysema Father   . Asthma Father   . Hypertension Daughter   . Heart disease Daughter        3 stents  . Diabetes Daughter   . Hypertension Son   . Cancer Son        prostate  . Diabetes Son   . Heart disease Daughter        heart attack, 2 stents  . Multiple sclerosis Daughter   . Diabetes Daughter   . Fibromyalgia Daughter   .  Diabetes Daughter     Social History   Socioeconomic History  . Marital status: Widowed    Spouse name: Joe  . Number of children: 4  . Years of education: some college  . Highest education level: 12th grade  Occupational History  . Occupation: Retired  Tobacco Use  . Smoking status: Never Smoker  . Smokeless tobacco: Never Used  . Tobacco comment: smoking cessation materials not required  Substance and Sexual Activity  . Alcohol use: No  . Drug use: No  . Sexual activity: Not Currently  Other Topics Concern  . Not on file  Social History Narrative  . Not on file   Social Determinants of Health   Financial Resource Strain:   .  Difficulty of Paying Living Expenses: Not on file  Food Insecurity:   . Worried About Charity fundraiser in the Last Year: Not on file  . Ran Out of Food in the Last Year: Not on file  Transportation Needs:   . Lack of Transportation (Medical): Not on file  . Lack of Transportation (Non-Medical): Not on file  Physical Activity:   . Days of Exercise per Week: Not on file  . Minutes of Exercise per Session: Not on file  Stress:   . Feeling of Stress : Not on file  Social Connections: Unknown  . Frequency of Communication with Friends and Family: More than three times a week  . Frequency of Social Gatherings with Friends and Family: Three times a week  . Attends Religious Services: More than 4 times per year  . Active Member of Clubs or Organizations: No  . Attends Archivist Meetings: Never  . Marital Status: Not on file  Intimate Partner Violence:   . Fear of Current or Ex-Partner: Not on file  . Emotionally Abused: Not on file  . Physically Abused: Not on file  . Sexually Abused: Not on file     Current Outpatient Medications:  .  acetaminophen (TYLENOL) 325 MG tablet, Take 650 mg by mouth 2 (two) times daily., Disp: , Rfl:  .  amLODipine (NORVASC) 2.5 MG tablet, Take 1 tablet (2.5 mg total) by mouth daily., Disp: 90 tablet, Rfl: 1 .  Ascorbic Acid (VITAMIN C) 1000 MG tablet, Take 1,000 mg by mouth 2 (two) times a day., Disp: , Rfl:  .  atorvastatin (LIPITOR) 10 MG tablet, Take 1 tablet (10 mg total) by mouth at bedtime. (do not take for 3 days after taking colchicine), Disp: 90 tablet, Rfl: 1 .  BIOTIN PO, Take 250 mg by mouth 2 (two) times daily. , Disp: , Rfl:  .  Calcium Carbonate-Vitamin D 600-200 MG-UNIT TABS, Take by mouth., Disp: , Rfl:  .  Calcium-Magnesium-Vitamin D (CALCIUM 1200+D3 PO), Take 1 capsule by mouth daily., Disp: , Rfl:  .  Cholecalciferol (VITAMIN D-3) 25 MCG (1000 UT) CAPS, Take 1 capsule by mouth 2 (two) times a day., Disp: , Rfl:  .  Cobalamin  Combinations (VITAMIN B12-FOLIC ACID) XX123456 MCG TABS, Take by mouth., Disp: , Rfl:  .  COLACE 100 MG capsule, Take 100 mg by mouth daily. , Disp: , Rfl:  .  Colchicine 0.6 MG CAPS, Take by mouth., Disp: , Rfl:  .  levothyroxine (SYNTHROID, LEVOTHROID) 100 MCG tablet, Take 1 tablet (100 mcg total) by mouth daily. (Patient taking differently: Take 100 mcg by mouth daily. Patient takes 1/2 tab two days and the rest of the days takes a full tablet), Disp: 90 tablet,  Rfl: 3 .  losartan (COZAAR) 100 MG tablet, Take 1 tablet by mouth daily (Avoid salt substitutes, no fruit smoothies.), Disp: 90 tablet, Rfl: 1 .  Magnesium 500 MG TABS, Take by mouth 2 (two) times daily. , Disp: , Rfl:  .  meclizine (ANTIVERT) 25 MG tablet, Take by mouth 2 (two) times daily as needed. , Disp: , Rfl:  .  niacinamide 500 MG tablet, Take 1 tablet (500 mg total) by mouth 2 (two) times daily with a meal., Disp: 180 tablet, Rfl: 1 .  OVER THE COUNTER MEDICATION, Take 1 capsule by mouth daily. CBD oil (Active Gold Label), Disp: , Rfl:  .  Turmeric 450 MG CAPS, Take 500 mg by mouth daily. , Disp: , Rfl:  .  ursodiol (ACTIGALL) 300 MG capsule, Take 300 mg by mouth daily. , Disp: , Rfl:  .  vitamin B-12 (CYANOCOBALAMIN) 250 MCG tablet, Take 250 mcg by mouth daily., Disp: , Rfl:  .  niacin (SLO-NIACIN) 500 MG tablet, Take by mouth 2 (two) times daily. , Disp: , Rfl:   Allergies  Allergen Reactions  . Indomethacin Hives  . Ace Inhibitors Swelling    Other reaction(s): SWELLING Facial swelling. Facial swelling.  . Augmentin [Amoxicillin-Pot Clavulanate] Other (See Comments)    Other reaction(s): Unknown  . Penicillins Nausea And Vomiting    Has patient had a PCN reaction causing immediate rash, facial/tongue/throat swelling, SOB or lightheadedness with hypotension: Yes Has patient had a PCN reaction causing severe rash involving mucus membranes or skin necrosis: No Has patient had a PCN reaction that required hospitalization:  No Has patient had a PCN reaction occurring within the last 10 years: No If all of the above answers are "NO", then may proceed with Cephalosporin use.  [name] Sulfur Other (See Comments)    Chart Review Today: I personally reviewed active problem list, medication list, allergies, family history, social history, health maintenance, notes from last encounter, lab results, imaging with the patient/caregiver today.  Review of Systems  Constitutional: Negative.   HENT: Negative.   Eyes: Negative.   Respiratory: Negative.   Cardiovascular: Negative.   Gastrointestinal: Negative.   Endocrine: Negative.   Genitourinary: Negative.   Musculoskeletal: Negative.   Skin: Negative.   Allergic/Immunologic: Negative.   Neurological: Negative.   Hematological: Negative.   Psychiatric/Behavioral: Negative.   All other systems reviewed and are negative.    Objective:    Vitals:   10/08/19 0946  BP: 120/80  Pulse: 90  Resp: 14  Temp: (!) 97.1 F (36.2 C)  TempSrc: Temporal  SpO2: 98%  Weight: 160 lb 3.2 oz (72.7 kg)  Height: 5\' 4"  (1.626 m)    Body mass index is 27.5 kg/m.  Physical Exam Vitals and nursing note reviewed.  Constitutional:      General: She is not in acute distress.    Appearance: Normal appearance. She is well-developed. She is not ill-appearing, toxic-appearing or diaphoretic.     Interventions: Face mask in place.  HENT:     Head: Normocephalic and atraumatic.     Right Ear: External ear normal.     Left Ear: External ear normal.  Eyes:     General: Lids are normal. No scleral icterus.       Right eye: No discharge.        Left eye: No discharge.     Conjunctiva/sclera: Conjunctivae normal.  Neck:     Trachea: Phonation normal. No tracheal deviation.  Cardiovascular:     Rate  and Rhythm: Normal rate and regular rhythm.     Pulses: Normal pulses.          Radial pulses are 2+ on the right side and 2+ on the left side.       Posterior tibial pulses are 2+ on  the right side and 2+ on the left side.     Heart sounds: Normal heart sounds. No murmur. No friction rub. No gallop.   Pulmonary:     Effort: Pulmonary effort is normal. No respiratory distress.     Breath sounds: Normal breath sounds. No stridor. No wheezing, rhonchi or rales.  Chest:     Chest wall: No tenderness.  Abdominal:     General: Bowel sounds are normal. There is no distension.     Palpations: Abdomen is soft.     Tenderness: There is no abdominal tenderness. There is no guarding or rebound.  Musculoskeletal:        General: No deformity. Normal range of motion.     Cervical back: Normal range of motion and neck supple.     Right lower leg: No edema.     Left lower leg: No edema.  Lymphadenopathy:     Cervical: No cervical adenopathy.  Skin:    General: Skin is warm and dry.     Capillary Refill: Capillary refill takes less than 2 seconds.     Coloration: Skin is not jaundiced or pale.     Findings: No rash.  Neurological:     Mental Status: She is alert and oriented to person, place, and time.     Motor: No abnormal muscle tone.     Gait: Gait normal.  Psychiatric:        Speech: Speech normal.        Behavior: Behavior normal.      PHQ2/9: Depression screen [facility] 2/9 10/08/2019 06/05/2019 03/12/2019 03/11/2019 03/06/2019  Decreased Interest 0 0 0 0 0  Down, Depressed, Hopeless 0 0 0 0 0  PHQ - 2 Score 0 0 0 0 0  Altered sleeping 0 0 0 - 0  Tired, decreased energy 0 0 0 - 0  Change in appetite 0 0 0 - 0  Feeling bad or failure about yourself  0 0 0 - 0  Trouble concentrating 0 0 0 - 0  Moving slowly or fidgety/restless 0 0 0 - 0  Suicidal thoughts 0 0 0 - 0  PHQ-9 Score 0 0 0 - 0  Difficult doing work/chores Not difficult at all Not difficult at all Not difficult at all - Not difficult at all  Some recent data might be hidden    phq 9 is negative, reviewed today  Fall Risk: Fall Risk  10/08/2019 06/05/2019 03/12/2019 03/11/2019 03/06/2019  Falls in the past year? 0  0 0 0 0  Number falls in past yr: 0 0 0 - -  Injury with Fall? 0 0 0 - -  Risk for fall due to : - - - - -  Risk for fall due to: Comment - - - - -  Follow up - - - - -    Functional Status Survey: Is the patient deaf or have difficulty hearing?: Yes Does the patient have difficulty seeing, even when wearing glasses/contacts?: No Does the patient have difficulty concentrating, remembering, or making decisions?: No Does the patient have difficulty walking or climbing stairs?: Yes Does the patient have difficulty dressing or bathing?: No Does the patient have difficulty doing errands  alone such as visiting a doctor's office or shopping?: No   Assessment & Plan:     ICD-10-CM   1. Essential hypertension, benign  I10 BMP w/ GFR - [facility]    amLODipine (NORVASC) 2.5 MG tablet   Stable, well-controlled recheck labs  2. Stage 3 chronic kidney disease, unspecified whether stage 3a or 3b CKD  N18.30 BMP w/ GFR - [facility]   Monitor kidney function, BP well controlled avoiding NSAIDs  3. Mixed hyperlipidemia  E78.2 BMP w/ GFR - [facility]    Lipid Panel   Stable, compliant with medications no side effects or concerns or myalgias  4. Postoperative hypothyroidism  E89.0 TSH   unstable - Last TSH abnormal low suggestive of chemical hyperthyroid, we decreased dose, recheck tsh today  5. Idiopathic chronic gout of right knee without tophus  M1A.0610 BMP w/ GFR - [facility]    Uric acid   Stable and well-controlled, recheck uric acid level, no recent flares  6. Hypomagnesemia  E83.42 Mag   Recheck labs to see if she can decrease her supplement    Return for 4 month .   Delsa Grana, PA-C 10/08/19 10:30 AM

## 2019-10-09 ENCOUNTER — Telehealth: Payer: Self-pay

## 2019-10-09 ENCOUNTER — Other Ambulatory Visit: Payer: Self-pay

## 2019-10-09 DIAGNOSIS — N179 Acute kidney failure, unspecified: Secondary | ICD-10-CM

## 2019-10-09 LAB — LIPID PANEL
Cholesterol: 155 mg/dL (ref <200)
HDL: 64 mg/dL (ref >=50)
LDL Cholesterol (Calc): 68 mg/dL (calc)
Non-HDL Cholesterol (Calc): 91 mg/dL (calc) (ref <130)
Total CHOL/HDL Ratio: 2.4 (calc) (ref <5.0)
Triglycerides: 152 mg/dL — ABNORMAL HIGH (ref <150)

## 2019-10-09 LAB — BASIC METABOLIC PANEL WITH GFR
BUN/Creatinine Ratio: 20 (calc) (ref 6–22)
BUN: 28 mg/dL — ABNORMAL HIGH (ref 7–25)
CO2: 26 mmol/L (ref 20–32)
Calcium: 10.2 mg/dL (ref 8.6–10.4)
Chloride: 102 mmol/L (ref 98–110)
Creat: 1.38 mg/dL — ABNORMAL HIGH (ref 0.60–0.88)
GFR, Est African American: 40 mL/min/{1.73_m2} — ABNORMAL LOW (ref >=60)
GFR, Est Non African American: 35 mL/min/{1.73_m2} — ABNORMAL LOW (ref >=60)
Glucose, Bld: 118 mg/dL — ABNORMAL HIGH (ref 65–99)
Potassium: 4.9 mmol/L (ref 3.5–5.3)
Sodium: 138 mmol/L (ref 135–146)

## 2019-10-09 LAB — MAGNESIUM: Magnesium: 1.9 mg/dL (ref 1.5–2.5)

## 2019-10-09 LAB — URIC ACID: Uric Acid, Serum: 6.9 mg/dL (ref 2.5–7.0)

## 2019-10-09 LAB — TSH: TSH: 0.53 mIU/L (ref 0.40–4.50)

## 2019-10-09 NOTE — Telephone Encounter (Signed)
-----   Message from Lucilla Lame, MD sent at 10/08/2019  5:28 PM EST ----- Let the patient know that her liver enzymes and tumor marker were all normal.

## 2019-10-10 ENCOUNTER — Other Ambulatory Visit: Payer: Self-pay

## 2019-10-10 DIAGNOSIS — E039 Hypothyroidism, unspecified: Secondary | ICD-10-CM

## 2019-10-10 NOTE — Telephone Encounter (Signed)
-----   Message from Delsa Grana, [place] sent at 10/09/2019  7:45 PM EST ----- Patient's thyroid is normal Magnesium level is good if she can stop the magnesium and do a multivitamin as long as it has some magnesium in it and we can recheck this in a month Uric acid was 6.9 which is above goal of 6.0  Kidney levels declined compared to her last labs, She has had lower kidney function before and it did improve- it may again but we need to recheck  please make sure she's well hydrated and avoid any NSAIDs and we can recheck BMP with gfr in a month as well  (Please put in orders for labs -BMP with GFR and mag for dx AKI and hypomag)

## 2019-10-25 DIAGNOSIS — E039 Hypothyroidism, unspecified: Secondary | ICD-10-CM | POA: Diagnosis not present

## 2019-10-25 LAB — BASIC METABOLIC PANEL WITH GFR
BUN/Creatinine Ratio: 21 (calc) (ref 6–22)
BUN: 30 mg/dL — ABNORMAL HIGH (ref 7–25)
CO2: 27 mmol/L (ref 20–32)
Calcium: 10.3 mg/dL (ref 8.6–10.4)
Chloride: 100 mmol/L (ref 98–110)
Creat: 1.42 mg/dL — ABNORMAL HIGH (ref 0.60–0.88)
GFR, Est African American: 39 mL/min/{1.73_m2} — ABNORMAL LOW (ref >=60)
GFR, Est Non African American: 33 mL/min/{1.73_m2} — ABNORMAL LOW (ref >=60)
Glucose, Bld: 100 mg/dL — ABNORMAL HIGH (ref 65–99)
Potassium: 4.5 mmol/L (ref 3.5–5.3)
Sodium: 136 mmol/L (ref 135–146)

## 2019-10-25 LAB — TSH: TSH: 1.16 mIU/L (ref 0.40–4.50)

## 2019-10-25 LAB — MAGNESIUM: Magnesium: 1.8 mg/dL (ref 1.5–2.5)

## 2019-11-21 ENCOUNTER — Other Ambulatory Visit: Payer: Self-pay

## 2019-11-21 MED ORDER — URSODIOL 300 MG PO CAPS: 300.0000 mg | ORAL_CAPSULE | Freq: Every day | ORAL | 1 refills | Status: DC

## 2019-11-21 NOTE — Telephone Encounter (Signed)
Patient is calling because she states she is running short on  Her Ursodiol 300mg . Patient will needs this sent to the mail order pharmacy.  Last office visit 10/07/2019 Primary biliary cholangitis  Last refill historical medication

## 2019-11-29 DIAGNOSIS — H35033 Hypertensive retinopathy, bilateral: Secondary | ICD-10-CM | POA: Diagnosis not present

## 2019-11-29 DIAGNOSIS — Z961 Presence of intraocular lens: Secondary | ICD-10-CM | POA: Diagnosis not present

## 2019-11-29 DIAGNOSIS — H02883 Meibomian gland dysfunction of right eye, unspecified eyelid: Secondary | ICD-10-CM | POA: Diagnosis not present

## 2019-11-29 DIAGNOSIS — H02886 Meibomian gland dysfunction of left eye, unspecified eyelid: Secondary | ICD-10-CM | POA: Diagnosis not present

## 2020-01-06 DIAGNOSIS — E871 Hypo-osmolality and hyponatremia: Secondary | ICD-10-CM | POA: Diagnosis not present

## 2020-01-06 DIAGNOSIS — M10379 Gout due to renal impairment, unspecified ankle and foot: Secondary | ICD-10-CM | POA: Diagnosis not present

## 2020-01-06 DIAGNOSIS — N1832 Chronic kidney disease, stage 3b: Secondary | ICD-10-CM | POA: Diagnosis not present

## 2020-01-06 DIAGNOSIS — N2581 Secondary hyperparathyroidism of renal origin: Secondary | ICD-10-CM | POA: Diagnosis not present

## 2020-01-06 DIAGNOSIS — I129 Hypertensive chronic kidney disease with stage 1 through stage 4 chronic kidney disease, or unspecified chronic kidney disease: Secondary | ICD-10-CM | POA: Diagnosis not present

## 2020-01-08 DIAGNOSIS — N2581 Secondary hyperparathyroidism of renal origin: Secondary | ICD-10-CM | POA: Diagnosis not present

## 2020-01-08 DIAGNOSIS — N1832 Chronic kidney disease, stage 3b: Secondary | ICD-10-CM | POA: Diagnosis not present

## 2020-01-08 DIAGNOSIS — E871 Hypo-osmolality and hyponatremia: Secondary | ICD-10-CM | POA: Diagnosis not present

## 2020-01-08 DIAGNOSIS — I129 Hypertensive chronic kidney disease with stage 1 through stage 4 chronic kidney disease, or unspecified chronic kidney disease: Secondary | ICD-10-CM | POA: Diagnosis not present

## 2020-02-05 ENCOUNTER — Encounter: Payer: Self-pay | Admitting: Family Medicine

## 2020-02-10 ENCOUNTER — Other Ambulatory Visit: Payer: Self-pay

## 2020-02-10 ENCOUNTER — Encounter: Payer: Self-pay | Admitting: Family Medicine

## 2020-02-10 ENCOUNTER — Ambulatory Visit (INDEPENDENT_AMBULATORY_CARE_PROVIDER_SITE_OTHER): Payer: PPO | Admitting: Family Medicine

## 2020-02-10 VITALS — BP 120/80 | HR 88 | Temp 97.6°F | Resp 16 | Ht 64.0 in | Wt 161.4 lb

## 2020-02-10 DIAGNOSIS — I10 Essential (primary) hypertension: Secondary | ICD-10-CM | POA: Diagnosis not present

## 2020-02-10 DIAGNOSIS — I779 Disorder of arteries and arterioles, unspecified: Secondary | ICD-10-CM

## 2020-02-10 DIAGNOSIS — N1832 Chronic kidney disease, stage 3b: Secondary | ICD-10-CM

## 2020-02-10 DIAGNOSIS — E782 Mixed hyperlipidemia: Secondary | ICD-10-CM | POA: Diagnosis not present

## 2020-02-10 DIAGNOSIS — E890 Postprocedural hypothyroidism: Secondary | ICD-10-CM | POA: Diagnosis not present

## 2020-02-10 DIAGNOSIS — N2581 Secondary hyperparathyroidism of renal origin: Secondary | ICD-10-CM | POA: Diagnosis not present

## 2020-02-10 DIAGNOSIS — M109 Gout, unspecified: Secondary | ICD-10-CM

## 2020-02-10 DIAGNOSIS — K743 Primary biliary cirrhosis: Secondary | ICD-10-CM | POA: Diagnosis not present

## 2020-02-10 DIAGNOSIS — Z5181 Encounter for therapeutic drug level monitoring: Secondary | ICD-10-CM

## 2020-02-10 MED ORDER — ATORVASTATIN CALCIUM 10 MG PO TABS: 10.0000 mg | ORAL_TABLET | Freq: Every day | ORAL | 3 refills | Status: DC

## 2020-02-10 MED ORDER — LOSARTAN POTASSIUM 100 MG PO TABS: ORAL_TABLET | ORAL | 3 refills | Status: DC

## 2020-02-10 MED ORDER — AMLODIPINE BESYLATE 2.5 MG PO TABS: 2.5000 mg | ORAL_TABLET | Freq: Every day | ORAL | 3 refills | Status: DC

## 2020-02-10 NOTE — Progress Notes (Signed)
Name: Caitlin Jenkins   MRN: [id number]    DOB: February 28, 1932   Date:02/10/2020       Progress Note  Chief Complaint  Patient presents with  . Hypothyroidism    4 month follow up, medication refills  . Hyperlipidemia  . Hypertension     Subjective:   Caitlin Jenkins is a 86 y.o. female, presents to clinic for routine follow up on the conditions listed above.  Hypothyroidism: History: postoperative hypothyroid Current Medication Regimen: 100 mcg 4 days a week and 1/2 tab 50 mcg Tu/Th/Sat of levothyroxine  Current Symptoms: denies weight changes, heat/cold intolerance, bowel/skin changes or CVS symptoms She reports 10 years of fatigue, no recent changes to energy  Since Sept TSH was low and med dose was reduced from 50 mcg 2x a week to 3x a week, last labs normal, pt wishes to repeat labs today. Lab Results  Component Value Date   TSH 1.16 10/25/2019   Hx of gout -she is not currently on any medicines to lower uric acid, reports she has not had a flare in about 1 year.  She does take colchicine at the onset of a gout flare and she hold her statin.  Last uric acid was 6.9 she wishes to have that rechecked today.  She states nobody has ever told her what to do to lower her uric acid, low purine diet was reviewed today  Nephrology visit and labs reviewed today from 01/08/2020:   Renal function and GFR with most recent labs reviewed through care everywhere were slightly improved from the last labs patient had done here in January following hospitalization.  Lab Results  Component Value Date   CREATININE 1.42 (H) 10/25/2019   Lab Results  Component Value Date   BUN 30 (H) 10/25/2019   Lab Results  Component Value Date   GFRNONAA 33 (L) 10/25/2019   Dr. Abigail Butts visit 01/08/2020:  Impression/Recommendations   Patient Active Problem List  Diagnosis  . Stage 3 chronic kidney disease ([place])  . Hyposmolality and/or hyponatremia  . Hypertensive chronic kidney disease, benign,  with chronic kidney disease stage I through stage IV, or unspecified  . Gout  . Secondary hyperparathyroidism of renal origin ([place])  . Hypomagnesemia   Orders Placed This Encounter  . PTH, Intact  . Renal Function Panel  . CBC and Differential  . Urinalysis, Complete w/reflex to Culture  . Protein, Total, Random Urine w/Creatinine (Protein/Creat Ratio)  . Magnesium  . cholecalciferol (VITAMIN D-3) 25 MCG (1000 UT) capsule   Return in about 6 months (around 07/09/2020).  Renal function was good with GFR 45  Hypertension:  Currently managed on amlodipine 2.5 mg and losartan 100 mg Pt reports good med compliance and denies any SE.  Occasional lightheadedness if she stands up too fast, she denies hypotension and syncope. Blood pressure today is well controlled. BP Readings from Last 3 Encounters:  02/10/20 120/80  10/08/19 120/80  10/07/19 (!) 148/85   Pt denies CP, SOB, exertional sx, LE edema, palpitation, Ha's, visual disturbances  Hypomagnesemia - from hospitalization last year she was supplementing with magnesium twice a day, magnesium was last checked in January and she has since decreased her back supplement to once a day, is not having any diarrhea, denies any muscle weakness or muscle spasms, does wish to have her magnesium checked again today  Hyperlipidemia:  Current Medication Regimen:   Atorvastatin 10 mg daily  Last Lipids: Lab Results  Component Value Date   CHOL  155 10/08/2019   HDL 64 10/08/2019   LDLCALC 68 10/08/2019   TRIG 152 (H) 10/08/2019   CHOLHDL 2.4 10/08/2019  - Current Diet:  healthy - Denies: Chest pain, shortness of breath, myalgias, she denies claudication type sx. - Risk factors for atherosclerosis: hypercholesterolemia and hypertension  Dexa scan due -patient states that her calcium has been good and she is recently started a supplement of vitamin D per nephrology she does not know the last time she had a bone scan but she states she has  never broken a bone, reports that she has rails on her front porch steps and she uses a lift to get up and down her flight of stairs in her home.  She declines me ordering in her doing a bone scan today, did discuss osteoporosis, fracture risk, fall prevention today.   Patient Active Problem List   Diagnosis Date Noted  . Benign hypertensive kidney disease with chronic kidney disease 07/03/2019  . Hypomagnesemia 07/03/2019  . Hyposmolality and/or hyponatremia 07/03/2019  . Cellulitis 02/23/2019  . History of basal cell carcinoma (BCC) 09/12/2018  . Hyperparathyroidism, secondary renal ([place]) 07/11/2018  . Primary biliary cholangitis ([place]) 03/20/2017  . Overweight (BMI 25.0-29.9) 11/29/2015  . Carotid artery disease ([place]) 10/09/2015  . Medication monitoring encounter 10/09/2015  . Nail abnormalities 10/09/2015  . Arthralgia of multiple joints 10/09/2015  . Essential hypertension, benign 06/19/2015  . 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 Hyperlipidemia   . Hypothyroidism   . Gout   . CKD (chronic kidney disease) stage 3, GFR 30-59 ml/min (HCC)   . Hiatal hernia 06/26/2014    Past Surgical History:  Procedure Laterality Date  . ABDOMINAL HYSTERECTOMY  1995  . CATARACT EXTRACTION    . FOOT SURGERY  2000  . HEMORRHOID SURGERY    . THYROID SURGERY  1999   para thyroid    Family History  Problem Relation Age of Onset  . Emphysema Father   . Asthma Father   . Hypertension Daughter   . Heart disease Daughter        3 stents  . Diabetes Daughter   . Hypertension Son   . Cancer Son        prostate  . Diabetes Son   . Heart disease Daughter        heart attack, 2 stents  . Multiple sclerosis Daughter   . Diabetes Daughter   . Fibromyalgia Daughter   . Diabetes Daughter     Social History   Tobacco Use  . Smoking status: Never Smoker  . Smokeless tobacco: Never Used  . Tobacco comment: smoking cessation materials not required    Substance Use Topics  . Alcohol use: No  . Drug use: No      Current Outpatient Medications:  .  acetaminophen (TYLENOL) 325 MG tablet, Take 650 mg by mouth 2 (two) times daily., Disp: , Rfl:  .  amLODipine (NORVASC) 2.5 MG tablet, Take 1 tablet (2.5 mg total) by mouth daily., Disp: 90 tablet, Rfl: 1 .  Ascorbic Acid (VITAMIN C) 1000 MG tablet, Take 1,000 mg by mouth 2 (two) times a day., Disp: , Rfl:  .  atorvastatin (LIPITOR) 10 MG tablet, Take 1 tablet (10 mg total) by mouth at bedtime. (do not take for 3 days after taking colchicine), Disp: 90 tablet, Rfl: 1 .  Cholecalciferol (VITAMIN D-3) 25 MCG (1000 UT) CAPS, Take 1 capsule by mouth 2 (two) times a day., Disp: ,  Rfl:  .  COLACE 100 MG capsule, Take 100 mg by mouth daily. , Disp: , Rfl:  .  Colchicine 0.6 MG CAPS, Take by mouth., Disp: , Rfl:  .  levothyroxine (SYNTHROID, LEVOTHROID) 100 MCG tablet, Take 1 tablet (100 mcg total) by mouth daily. (Patient taking differently: Take 100 mcg by mouth daily. Patient takes 1/2 tab two days and the rest of the days takes a full tablet), Disp: 90 tablet, Rfl: 3 .  losartan (COZAAR) 100 MG tablet, Take 1 tablet by mouth daily (Avoid salt substitutes, no fruit smoothies.), Disp: 90 tablet, Rfl: 1 .  Magnesium 500 MG TABS, Take by mouth 2 (two) times daily. , Disp: , Rfl:  .  meclizine (ANTIVERT) 25 MG tablet, Take by mouth 2 (two) times daily as needed. , Disp: , Rfl:  .  niacinamide 500 MG tablet, Take 1 tablet (500 mg total) by mouth 2 (two) times daily with a meal., Disp: 180 tablet, Rfl: 1 .  OVER THE COUNTER MEDICATION, Take 1 capsule by mouth daily. CBD oil (Active Gold Label), Disp: , Rfl:  .  Turmeric 450 MG CAPS, Take 500 mg by mouth daily. , Disp: , Rfl:  .  ursodiol (ACTIGALL) 300 MG capsule, Take 1 capsule (300 mg total) by mouth daily., Disp: 90 capsule, Rfl: 1 .  vitamin B-12 (CYANOCOBALAMIN) 250 MCG tablet, Take 250 mcg by mouth daily., Disp: , Rfl:   Allergies  Allergen  Reactions  . Indomethacin Hives  . Ace Inhibitors Swelling    Other reaction(s): SWELLING Facial swelling. Facial swelling.  . Augmentin [Amoxicillin-Pot Clavulanate] Other (See Comments)    Other reaction(s): Unknown  . Penicillins Nausea And Vomiting    Has patient had a PCN reaction causing immediate rash, facial/tongue/throat swelling, SOB or lightheadedness with hypotension: Yes Has patient had a PCN reaction causing severe rash involving mucus membranes or skin necrosis: No Has patient had a PCN reaction that required hospitalization: No Has patient had a PCN reaction occurring within the last 10 years: No If all of the above answers are "NO", then may proceed with Cephalosporin use.  [name] Sulfur Other (See Comments)    Chart Review Today: I personally reviewed active problem list, medication list, allergies, family history, social history, health maintenance, notes from last encounter, lab results, imaging with the patient/caregiver today.   Review of Systems  10 Systems reviewed and are negative for acute change except as noted in the HPI.  Objective:    Vitals:   02/10/20 0855  BP: 120/80  Pulse: 88  Resp: 16  Temp: 97.6 F (36.4 C)  TempSrc: Temporal  SpO2: 99%  Weight: 161 lb 6.4 oz (73.2 kg)  Height: 5\' 4"  (1.626 m)    Body mass index is 27.7 kg/m.  Physical Exam Vitals and nursing note reviewed.  Constitutional:      Appearance: She is well-developed.  HENT:     Head: Normocephalic and atraumatic.     Nose: Nose normal.  Eyes:     General:        Right eye: No discharge.        Left eye: No discharge.     Conjunctiva/sclera: Conjunctivae normal.  Neck:     Trachea: No tracheal deviation.  Cardiovascular:     Rate and Rhythm: Normal rate and regular rhythm.  Pulmonary:     Effort: Pulmonary effort is normal. No respiratory distress.     Breath sounds: No stridor.  Musculoskeletal:  General: Normal range of motion.  Skin:    General: Skin  is warm and dry.     Findings: No rash.  Neurological:     Mental Status: She is alert.     Motor: No abnormal muscle tone.     Coordination: Coordination normal.  Psychiatric:        Behavior: Behavior normal.       PHQ2/9: Depression screen [facility] 2/9 02/10/2020 10/08/2019 06/05/2019 03/12/2019 03/11/2019  Decreased Interest 0 0 0 0 0  Down, Depressed, Hopeless 0 0 0 0 0  PHQ - 2 Score 0 0 0 0 0  Altered sleeping 0 0 0 0 -  Tired, decreased energy 0 0 0 0 -  Change in appetite 0 0 0 0 -  Feeling bad or failure about yourself  0 0 0 0 -  Trouble concentrating 0 0 0 0 -  Moving slowly or fidgety/restless 0 0 0 0 -  Suicidal thoughts 0 0 0 0 -  PHQ-9 Score 0 0 0 0 -  Difficult doing work/chores Not difficult at all Not difficult at all Not difficult at all Not difficult at all -  Some recent data might be hidden    phq 9 is neg, reviewed  Fall Risk: Fall Risk  02/10/2020 10/08/2019 06/05/2019 03/12/2019 03/11/2019  Falls in the past year? 0 0 0 0 0  Number falls in past yr: 0 0 0 0 -  Injury with Fall? 0 0 0 0 -  Risk for fall due to : - - - - -  Risk for fall due to: Comment - - - - -  Follow up Falls evaluation completed - - - -    Functional Status Survey: Is the patient deaf or have difficulty hearing?: Yes Does the patient have difficulty seeing, even when wearing glasses/contacts?: No Does the patient have difficulty concentrating, remembering, or making decisions?: No Does the patient have difficulty walking or climbing stairs?: No Does the patient have difficulty dressing or bathing?: No Does the patient have difficulty doing errands alone such as visiting a doctor's office or shopping?: No   Assessment & Plan:     ICD-10-CM   1. Essential hypertension, benign  I10 amLODipine (NORVASC) 2.5 MG tablet    losartan (COZAAR) 100 MG tablet    COMPLETE METABOLIC PANEL WITH GFR   Stable, well-controlled recheck labs  2. Mixed hyperlipidemia  E78.2 atorvastatin (LIPITOR) 10 MG  tablet    COMPLETE METABOLIC PANEL WITH GFR    Lipid panel    CANCELED: Lipid panel   Compliant with statins, due for labs today  3. Hyperparathyroidism, secondary renal ([place])  [id number] COMPLETE METABOLIC PANEL WITH GFR   monitored by nephrology  4. 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 Stage 3b chronic kidney disease  [id number] COMPLETE METABOLIC PANEL WITH GFR    CBC with Differential/Platelet    Lipid panel   He is nephrology, monitoring renal function, blood pressure well controlled, patient avoids NSAIDs and tries to remain well-hydrated  6. Postoperative hypothyroidism  E89.0 TSH    CANCELED: TSH   Recent dose adjustments in her medication she has been decreasing her doses recheck tsh  7. Gout, unspecified cause, unspecified chronicity, unspecified site  [id number] COMPLETE METABOLIC PANEL WITH GFR    Uric acid    Uric acid   Patient has gout that  is well controlled with diet and lifestyle, she wishes to check uric acid today  8. Bilateral carotid artery disease, unspecified type (HCC) Chronic I77.9 atorvastatin (LIPITOR) 10 MG tablet    COMPLETE METABOLIC PANEL WITH GFR    Lipid panel   On statin, compliant, monitoring with specialist  9. Hypomagnesemia  [id number] COMPLETE METABOLIC PANEL WITH GFR    Magnesium   Patient would like to recheck magnesium level she has been on supplements for over 6 months  10. Encounter for medication monitoring  [id number] COMPLETE METABOLIC PANEL WITH GFR    CBC with Differential/Platelet    Lipid panel    TSH    Magnesium    CANCELED: Lipid panel    CANCELED: TSH     Return for 6 month routine f/up.   Delsa Grana, PA-C 02/10/20 9:06 AM

## 2020-02-10 NOTE — Patient Instructions (Signed)

## 2020-02-11 ENCOUNTER — Encounter: Payer: Self-pay | Admitting: Family Medicine

## 2020-02-11 DIAGNOSIS — E039 Hypothyroidism, unspecified: Secondary | ICD-10-CM

## 2020-02-11 LAB — CBC WITH DIFFERENTIAL/PLATELET
Absolute Monocytes: 689 cells/uL (ref 200–950)
Basophils Absolute: 34 cells/uL (ref 0–200)
Basophils Relative: 0.4 %
Eosinophils Absolute: 170 cells/uL (ref 15–500)
Eosinophils Relative: 2 %
HCT: 40.2 % (ref 35.0–45.0)
Hemoglobin: 13.3 g/dL (ref 11.7–15.5)
Lymphs Abs: 2406 cells/uL (ref 850–3900)
MCH: 29.9 pg (ref 27.0–33.0)
MCHC: 33.1 g/dL (ref 32.0–36.0)
MCV: 90.3 fL (ref 80.0–100.0)
MPV: 8.9 fL (ref 7.5–12.5)
Monocytes Relative: 8.1 %
Neutro Abs: 5202 cells/uL (ref 1500–7800)
Neutrophils Relative %: 61.2 %
Platelets: 235 10*3/uL (ref 140–400)
RBC: 4.45 10*6/uL (ref 3.80–5.10)
RDW: 13 % (ref 11.0–15.0)
Total Lymphocyte: 28.3 %
WBC: 8.5 10*3/uL (ref 3.8–10.8)

## 2020-02-11 LAB — COMPLETE METABOLIC PANEL WITH GFR
AG Ratio: 1.6 (calc) (ref 1.0–2.5)
ALT: 10 U/L (ref 6–29)
AST: 19 U/L (ref 10–35)
Albumin: 4.2 g/dL (ref 3.6–5.1)
Alkaline phosphatase (APISO): 100 U/L (ref 37–153)
BUN/Creatinine Ratio: 17 (calc) (ref 6–22)
BUN: 20 mg/dL (ref 7–25)
CO2: 26 mmol/L (ref 20–32)
Calcium: 9.6 mg/dL (ref 8.6–10.4)
Chloride: 100 mmol/L (ref 98–110)
Creat: 1.15 mg/dL — ABNORMAL HIGH (ref 0.60–0.88)
GFR, Est African American: 50 mL/min/{1.73_m2} — ABNORMAL LOW (ref >=60)
GFR, Est Non African American: 43 mL/min/{1.73_m2} — ABNORMAL LOW (ref >=60)
Globulin: 2.6 g/dL (calc) (ref 1.9–3.7)
Glucose, Bld: 95 mg/dL (ref 65–99)
Potassium: 4 mmol/L (ref 3.5–5.3)
Sodium: 135 mmol/L (ref 135–146)
Total Bilirubin: 0.5 mg/dL (ref 0.2–1.2)
Total Protein: 6.8 g/dL (ref 6.1–8.1)

## 2020-02-11 LAB — LIPID PANEL
Cholesterol: 152 mg/dL (ref <200)
HDL: 73 mg/dL (ref >=50)
LDL Cholesterol (Calc): 62 mg/dL (calc)
Non-HDL Cholesterol (Calc): 79 mg/dL (calc) (ref <130)
Total CHOL/HDL Ratio: 2.1 (calc) (ref <5.0)
Triglycerides: 91 mg/dL (ref <150)

## 2020-02-11 LAB — MAGNESIUM: Magnesium: 1.7 mg/dL (ref 1.5–2.5)

## 2020-02-11 LAB — TSH: TSH: 1.89 mIU/L (ref 0.40–4.50)

## 2020-02-11 LAB — URIC ACID: Uric Acid, Serum: 6.1 mg/dL (ref 2.5–7.0)

## 2020-02-11 MED ORDER — LEVOTHYROXINE SODIUM 88 MCG PO TABS: 88.0000 ug | ORAL_TABLET | Freq: Every day | ORAL | 3 refills | Status: DC

## 2020-02-27 ENCOUNTER — Other Ambulatory Visit: Payer: Self-pay

## 2020-02-27 ENCOUNTER — Ambulatory Visit (INDEPENDENT_AMBULATORY_CARE_PROVIDER_SITE_OTHER): Payer: PPO

## 2020-02-27 VITALS — BP 141/86 | HR 84 | Temp 97.2°F | Ht 64.0 in | Wt 159.1 lb

## 2020-02-27 DIAGNOSIS — Z0000 Encounter for general adult medical examination without abnormal findings: Secondary | ICD-10-CM | POA: Diagnosis not present

## 2020-02-27 NOTE — Patient Instructions (Addendum)
Caitlin Jenkins , Thank you for taking time to come for your Medicare Wellness Visit. I appreciate your ongoing commitment to your health goals. Please review the following plan we discussed and let me know if I can assist you in the future.   Screening recommendations/referrals: Colonoscopy: not longer required Mammogram: no longer required Bone Density: no longer required Recommended yearly ophthalmology/optometry visit for glaucoma screening and checkup Recommended yearly dental visit for hygiene and checkup  Vaccinations: Influenza vaccine: 06/05/2019 Pneumococcal vaccine: 05/16/2014 Tdap vaccine: postponed Shingles vaccine: 07/24/2018 11/07/2018   Covid-19:discussed   Conditions/risks identified: Recommend drinking 6-8 glasses of water a day  Next appointment: Follow up in one year for your annual wellness visit    Preventive Care 45 Years and Older, Female Preventive care refers to lifestyle choices and visits with your health care provider that can promote health and wellness. What does preventive care include?  A yearly physical exam. This is also called an annual well check.  Dental exams once or twice a year.  Routine eye exams. Ask your health care provider how often you should have your eyes checked.  Personal lifestyle choices, including:  Daily care of your teeth and gums.  Regular physical activity.  Eating a healthy diet.  Avoiding tobacco and drug use.  Limiting alcohol use.  Practicing safe sex.  Taking low-dose aspirin every day.  Taking vitamin and mineral supplements as recommended by your health care provider. What happens during an annual well check? The services and screenings done by your health care provider during your annual well check will depend on your age, overall health, lifestyle risk factors, and family history of disease. Counseling  Your health care provider may ask you questions about your:  Alcohol use.  Tobacco  use.  Drug use.  Emotional well-being.  Home and relationship well-being.  Sexual activity.  Eating habits.  History of falls.  Memory and ability to understand (cognition).  Work and work Statistician.  Reproductive health. Screening  You may have the following tests or measurements:  Height, weight, and BMI.  Blood pressure.  Lipid and cholesterol levels. These may be checked every 5 years, or more frequently if you are over 7 years old.  Skin check.  Lung cancer screening. You may have this screening every year starting at age 69 if you have a 30-pack-year history of smoking and currently smoke or have quit within the past 15 years.  Fecal occult blood test (FOBT) of the stool. You may have this test every year starting at age 73.  Flexible sigmoidoscopy or colonoscopy. You may have a sigmoidoscopy every 5 years or a colonoscopy every 10 years starting at age 90.  Hepatitis C blood test.  Hepatitis B blood test.  Sexually transmitted disease (STD) testing.  Diabetes screening. This is done by checking your blood sugar (glucose) after you have not eaten for a while (fasting). You may have this done every 1-3 years.  Bone density scan. This is done to screen for osteoporosis. You may have this done starting at age 59.  Mammogram. This may be done every 1-2 years. Talk to your health care provider about how often you should have regular mammograms. Talk with your health care provider about your test results, treatment options, and if necessary, the need for more tests. Vaccines  Your health care provider may recommend certain vaccines, such as:  Influenza vaccine. This is recommended every year.  Tetanus, diphtheria, and acellular pertussis (Tdap, Td) vaccine. You may need a  Td booster every 10 years.  Zoster vaccine. You may need this after age 41.  Pneumococcal 13-valent conjugate (PCV13) vaccine. One dose is recommended after age 78.  Pneumococcal  polysaccharide (PPSV23) vaccine. One dose is recommended after age 81. Talk to your health care provider about which screenings and vaccines you need and how often you need them. This information is not intended to replace advice given to you by your health care provider. Make sure you discuss any questions you have with your health care provider. Document Released: 10/09/2015 Document Revised: 06/01/2016 Document Reviewed: 07/14/2015 Elsevier Interactive Patient Education  2017 [place] Prevention in the Home Falls can cause injuries. They can happen to people of all ages. There are many things you can do to make your home safe and to help prevent falls. What can I do on the outside of my home?  Regularly fix the edges of walkways and driveways and fix any cracks.  Remove anything that might make you trip as you walk through a door, such as a raised step or threshold.  Trim any bushes or trees on the path to your home.  Use bright outdoor lighting.  Clear any walking paths of anything that might make someone trip, such as rocks or tools.  Regularly check to see if handrails are loose or broken. Make sure that both sides of any steps have handrails.  Any raised decks and porches should have guardrails on the edges.  Have any leaves, snow, or ice cleared regularly.  Use sand or salt on walking paths during winter.  Clean up any spills in your garage right away. This includes oil or grease spills. What can I do in the bathroom?  Use night lights.  Install grab bars by the toilet and in the tub and shower. Do not use towel bars as grab bars.  Use non-skid mats or decals in the tub or shower.  If you need to sit down in the shower, use a plastic, non-slip stool.  Keep the floor dry. Clean up any water that spills on the floor as soon as it happens.  Remove soap buildup in the tub or shower regularly.  Attach bath mats securely with double-sided non-slip rug  tape.  Do not have throw rugs and other things on the floor that can make you trip. What can I do in the bedroom?  Use night lights.  Make sure that you have a light by your bed that is easy to reach.  Do not use any sheets or blankets that are too big for your bed. They should not hang down onto the floor.  Have a firm chair that has side arms. You can use this for support while you get dressed.  Do not have throw rugs and other things on the floor that can make you trip. What can I do in the kitchen?  Clean up any spills right away.  Avoid walking on wet floors.  Keep items that you use a lot in easy-to-reach places.  If you need to reach something above you, use a strong step stool that has a grab bar.  Keep electrical cords out of the way.  Do not use floor polish or wax that makes floors slippery. If you must use wax, use non-skid floor wax.  Do not have throw rugs and other things on the floor that can make you trip. What can I do with my stairs?  Do not leave any items on the stairs.  Make sure that there are handrails on both sides of the stairs and use them. Fix handrails that are broken or loose. Make sure that handrails are as long as the stairways.  Check any carpeting to make sure that it is firmly attached to the stairs. Fix any carpet that is loose or worn.  Avoid having throw rugs at the top or bottom of the stairs. If you do have throw rugs, attach them to the floor with carpet tape.  Make sure that you have a light switch at the top of the stairs and the bottom of the stairs. If you do not have them, ask someone to add them for you. What else can I do to help prevent falls?  Wear shoes that:  Do not have high heels.  Have rubber bottoms.  Are comfortable and fit you well.  Are closed at the toe. Do not wear sandals.  If you use a stepladder:  Make sure that it is fully opened. Do not climb a closed stepladder.  Make sure that both sides of the  stepladder are locked into place.  Ask someone to hold it for you, if possible.  Clearly mark and make sure that you can see:  Any grab bars or handrails.  First and last steps.  Where the edge of each step is.  Use tools that help you move around (mobility aids) if they are needed. These include:  Canes.  Walkers.  Scooters.  Crutches.  Turn on the lights when you go into a dark area. Replace any light bulbs as soon as they burn out.  Set up your furniture so you have a clear path. Avoid moving your furniture around.  If any of your floors are uneven, fix them.  If there are any pets around you, be aware of where they are.  Review your medicines with your doctor. Some medicines can make you feel dizzy. This can increase your chance of falling. Ask your doctor what other things that you can do to help prevent falls. This information is not intended to replace advice given to you by your health care provider. Make sure you discuss any questions you have with your health care provider. Document Released: 07/09/2009 Document Revised: 02/18/2016 Document Reviewed: 10/17/2014 Elsevier Interactive Patient Education  2017 [employer].

## 2020-02-27 NOTE — Progress Notes (Signed)
Subjective:   Caitlin Jenkins is a 86 y.o. female who presents for Medicare Annual (Subsequent) preventive examination.  Review of Systems:   Cardiac Risk Factors include: advanced age (>81men, >83 women);hypertension;dyslipidemia     Objective:     Vitals: BP (!) 141/86   Pulse 84   Temp (!) 97.2 F (36.2 C)   Ht 5\' 4"  (1.626 m)   Wt 159 lb 1.6 oz (72.2 kg)   BMI 27.31 kg/m   Body mass index is 27.31 kg/m.  Advanced Directives 02/27/2020 03/05/2019 02/23/2019 02/23/2019 05/11/2018 05/11/2018 03/02/2018  Does Patient Have a Medical Advance Directive? Yes Yes Yes Yes Yes Yes Yes  Type of Paramedic of [place];Living will [place];Living will [place];Living will [place];Living will Living will;Healthcare Power of [place];Living will  Does patient want to make changes to medical advance directive? - - No - Patient declined No - Patient declined No - Patient declined - -  Copy of [place] in Chart? Yes - validated most recent copy scanned in chart (See row information) Yes - validated most recent copy scanned in chart (See row information) No - copy requested No - copy requested No - copy requested - No - copy requested  Would patient like information on creating a medical advance directive? - - - - - No - Patient declined -    Tobacco Social History   Tobacco Use  Smoking Status Never Smoker  Smokeless Tobacco Never Used  Tobacco Comment   smoking cessation materials not required     Counseling given: Not Answered Comment: smoking cessation materials not required   Clinical Intake:  Pre-visit preparation completed: Yes  Pain : No/denies pain     BMI - recorded: 27.3 Nutritional Status: BMI 25 -29 Overweight Nutritional Risks: None Diabetes: No  How often do you need to have someone help you when you read instructions, pamphlets, or  other written materials from your doctor or pharmacy?: 1 - Never  Interpreter Needed?: No  Information entered by :: Charlott Rakes, LPN  Past Medical History:  Diagnosis Date  . Biliary cirrhosis ([place])   . Cancer of skin of leg   . Cellulitis   . CKD (chronic kidney disease) stage 3, GFR 30-59 ml/min   . Gout   . Hiatal hernia Oct 2015   8.5cm  . History of basal cell carcinoma (BCC) 09/12/2018   Oct 2016; Moh's surgery; right antitragus  . History of diverticulitis 2000  . Hyperlipidemia   . Hyperparathyroidism, secondary renal ([place]) 07/11/2018   Managed by nephrologist  . Hypertension   . Hypothyroidism   . Skin cancer of nose    Past Surgical History:  Procedure Laterality Date  . ABDOMINAL HYSTERECTOMY  1995  . CATARACT EXTRACTION    . FOOT SURGERY  2000  . HEMORRHOID SURGERY    . THYROID SURGERY  1999   para thyroid   Family History  Problem Relation Age of Onset  . Emphysema Father   . Asthma Father   . Hypertension Daughter   . Heart disease Daughter        3 stents  . Diabetes Daughter   . Hypertension Son   . Cancer Son        prostate  . Diabetes Son   . Heart disease Daughter        heart attack, 2 stents  . Multiple sclerosis Daughter   .  Diabetes Daughter   . Fibromyalgia Daughter   . Diabetes Daughter    Social History   Socioeconomic History  . Marital status: Widowed    Spouse name: Joe  . Number of children: 4  . Years of education: some college  . Highest education level: 12th grade  Occupational History  . Occupation: Retired  Tobacco Use  . Smoking status: Never Smoker  . Smokeless tobacco: Never Used  . Tobacco comment: smoking cessation materials not required  Substance and Sexual Activity  . Alcohol use: No  . Drug use: No  . Sexual activity: Not Currently  Other Topics Concern  . Not on file  Social History Narrative  . Not on file   Social Determinants of Health   Financial Resource Strain: Low Risk   .  Difficulty of Paying Living Expenses: Not hard at all  Food Insecurity: No Food Insecurity  . Worried About Charity fundraiser in the Last Year: Never true  . Ran Out of Food in the Last Year: Never true  Transportation Needs: No Transportation Needs  . Lack of Transportation (Medical): No  . Lack of Transportation (Non-Medical): No  Physical Activity: Inactive  . Days of Exercise per Week: 0 days  . Minutes of Exercise per Session: 0 min  Stress: No Stress Concern Present  . Feeling of Stress : Not at all  Social Connections: Somewhat Isolated  . Frequency of Communication with Friends and Family: Three times a week  . Frequency of Social Gatherings with Friends and Family: Three times a week  . Attends Religious Services: More than 4 times per year  . Active Member of Clubs or Organizations: No  . Attends Archivist Meetings: Never  . Marital Status: Widowed    Outpatient Encounter Medications as of 02/27/2020  Medication Sig  . acetaminophen (TYLENOL) 325 MG tablet Take 650 mg by mouth 2 (two) times daily.  [name] amLODipine (NORVASC) 2.5 MG tablet Take 1 tablet (2.5 mg total) by mouth daily.  . Ascorbic Acid (VITAMIN C) 1000 MG tablet Take 1,000 mg by mouth 2 (two) times a day.  [name] atorvastatin (LIPITOR) 10 MG tablet Take 1 tablet (10 mg total) by mouth at bedtime. (do not take for 3 days after taking colchicine)  . Cholecalciferol (VITAMIN D-3) 25 MCG (1000 UT) CAPS Take 1 capsule by mouth 2 (two) times a day.  [name] COLACE 100 MG capsule Take 100 mg by mouth daily.   [name] levothyroxine (SYNTHROID) 88 MCG tablet Take 1 tablet (88 mcg total) by mouth daily.  [name] losartan (COZAAR) 100 MG tablet Take 1 tablet by mouth daily (Avoid salt substitutes, no fruit smoothies.)  . Magnesium 500 MG TABS Take by mouth 2 (two) times daily.   . meclizine (ANTIVERT) 25 MG tablet Take by mouth 2 (two) times daily as needed.   . niacinamide 500 MG tablet Take 1 tablet (500 mg total) by mouth 2 (two)  times daily with a meal.  . OVER THE COUNTER MEDICATION Take 1 capsule by mouth daily. CBD oil (Active Gold Label)  . thiamine 100 MG tablet Take 100 mg by mouth daily.  . Turmeric 450 MG CAPS Take 500 mg by mouth daily.   . ursodiol (ACTIGALL) 300 MG capsule Take 1 capsule (300 mg total) by mouth daily.  . vitamin B-12 (CYANOCOBALAMIN) 250 MCG tablet Take 250 mcg by mouth daily.  . Colchicine 0.6 MG CAPS Take by mouth.   No facility-administered encounter medications on  file as of 02/27/2020.    Activities of Daily Living In your present state of health, do you have any difficulty performing the following activities: 02/27/2020 02/10/2020  Hearing? Tempie Donning  Comment wears hearing aids -  Vision? N N  Difficulty concentrating or making decisions? N N  Walking or climbing stairs? Y N  Comment - -  Dressing or bathing? N N  Doing errands, shopping? N N  Preparing Food and eating ? N -  Using the Toilet? N -  In the past six months, have you accidently leaked urine? N -  Do you have problems with loss of bowel control? N -  Managing your Medications? N -  Managing your Finances? N -  Housekeeping or managing your Housekeeping? N -  Some recent data might be hidden    Patient Care Team: Delsa Grana, PA-C as PCP - General (Family Medicine) Dasher, Rayvon Char, MD (Dermatology) Lavonia Dana, MD as Consulting Physician (Internal Medicine) Marval Regal, NP as Nurse Practitioner (Nurse Practitioner)    Assessment:   This is a routine wellness examination for Kimberlie.  Exercise Activities and Dietary recommendations Current Exercise Habits: The patient does not participate in regular exercise at present, Exercise limited by: orthopedic condition(s)  Goals    . DIET - INCREASE WATER INTAKE     Recommend to drink at least 6-8 8oz glasses of water per day.    . I think I am doing very well now managing my health (pt-stated)     Current Barriers:  . Hearing loss . Chronic Disease  Management Support  Nurse Case Manager Clinical Goal(s):  [name] Over the next 60 days, patient will not experience hospital admission. Hospital Admissions in last 6 months = 1 . Over the next 30 days, the patient will demonstrate ongoing self health care management ability as evidenced by taking medications as prescribed, attending all medical appointments, remaining active, and calling CCM RN CM is additional needs arise  Interventions:  . Assessed for improvement in cellulitis on ankle and legs . Assessed for additional educational needs/health management needs . Provided patient with contact information and encouraged to contact CCM RN Cm if additional needs arise. . Discussed and encouraged engagement with [place]  Patient Self Care Activities:  . Self administers medications as prescribed . Attends all scheduled provider appointments . Calls pharmacy for medication refills . Attends church or other social activities . Performs ADL's independently . Performs IADL's independently . Calls provider office for new concerns or questions  Please see past updates related to this goal by clicking on the "Past Updates" button in the selected goal         Fall Risk Fall Risk  02/27/2020 02/10/2020 10/08/2019 06/05/2019 03/12/2019  Falls in the past year? 0 0 0 0 0  Number falls in past yr: 0 0 0 0 0  Injury with Fall? 0 0 0 0 0  Risk for fall due to : No Fall Risks - - - -  Risk for fall due to: Comment - - - - -  Follow up Falls prevention discussed Falls evaluation completed - - -   FALL RISK PREVENTION PERTAINING TO THE HOME:  Any stairs in or around the home? No  If so, are there any without handrails? Yes   Home free of loose throw rugs in walkways, pet beds, electrical cords, etc? Yes  Adequate lighting in your home to reduce risk of falls? Yes   ASSISTIVE DEVICES UTILIZED TO  PREVENT FALLS:  Life alert? Yes  Use of a cane, walker or w/c? No  Grab bars in the bathroom? Yes    Shower chair or bench in shower? Yes  Elevated toilet seat or a handicapped toilet? Yes   DME ORDERS:  DME order needed?  No   TIMED UP AND GO:  Was the test performed? Yes .  Length of time to ambulate 10 feet: 5 sec.   GAIT:  Appearance of gait: Gait steady and fast without the use of an assistive device.  Education: Fall risk prevention has been discussed.  Intervention(s) required? No    DME/home health order needed?  No   Depression Screen PHQ 2/9 Scores 02/27/2020 02/10/2020 10/08/2019 06/05/2019  PHQ - 2 Score 0 0 0 0  PHQ- 9 Score - 0 0 0     Cognitive Function     6CIT Screen 02/27/2020 03/05/2019 03/02/2018  What Year? 0 points 0 points 0 points  What month? 0 points 0 points 0 points  What time? 0 points 0 points 0 points  Count back from 20 0 points 0 points 0 points  Months in reverse 0 points 0 points 0 points  Repeat phrase 0 points 0 points 0 points  Total Score 0 0 0    Immunization History  Administered Date(s) Administered  . Fluad Quad(high Dose 65+) 06/05/2019  . Influenza, High Dose Seasonal PF 05/24/2016, 08/02/2017, 06/13/2018  . Influenza, Seasonal, Injecte, Preservative Fre 07/22/2013, 07/15/2014  . Influenza,inj,Quad PF,6+ Mos 06/19/2015  . Influenza-Unspecified 07/18/2012, 07/21/2014, 08/02/2017  . Pneumococcal Conjugate-13 05/16/2014  . Pneumococcal Polysaccharide-23 09/26/2013  . Zoster 09/26/2010  . Zoster Recombinat (Shingrix) 07/24/2018, 11/07/2018    Qualifies for Shingles Vaccine? Up to date  Tdap: Although this vaccine is not a covered service during a Wellness Exam, does the patient still wish to receive this vaccine today?  No .  Education has been provided regarding the importance of this vaccine. Advised may receive this vaccine at local pharmacy or Health Dept. Aware to provide a copy of the vaccination record if obtained from local pharmacy or Health Dept. Verbalized acceptance and understanding.  Flu Vaccine: Up to  date  Pneumococcal Vaccine: Up to date  Covid-19 Vaccine: Due for Covid-19 vaccine. Education has been provided regarding the importance of this vaccine. Patient advised may receive at local pharmacy, Health Dept or [facility]. Aware to provide a copy of the vaccination record once obtained. Verbalized acceptance and understanding.    Screening Tests Health Maintenance  Topic Date Due  . TETANUS/TDAP  09/26/2020 (Originally 10/03/1950)  . COVID-19 Vaccine (1) 09/26/2020 (Originally 10/03/1943)  . DEXA SCAN  02/09/2021 (Originally 10/03/2016)  . INFLUENZA VACCINE  04/26/2020  . PNA vac Low Risk Adult  Completed    Cancer Screenings:  Colorectal Screening: No longer required.   Mammogram:  No longer required.   Bone Density: No longer required.   Lung Cancer Screening: (Low Dose CT Chest recommended if Age 32-80 years, 30 pack-year currently smoking OR have quit w/in 15years.) does not qualify.   Additional Screening:  Hepatitis C Screening: no longer required  Vision Screening: Recommended annual ophthalmology exams for early detection of glaucoma and other disorders of the eye. Is the patient up to date with their annual eye exam?  Yes  Who is the provider or what is the name of the office in which the pt attends annual eye exams? Dr. Ellin Mayhew  Dental Screening: Recommended annual dental exams for proper oral hygiene  Community Resource Referral:  CRR required this visit?  No      Plan:     I have personally reviewed and addressed the Medicare Annual Wellness questionnaire and have noted the following in the patient's chart:  A. Medical and social history B. Use of alcohol, tobacco or illicit drugs  C. Current medications and supplements D. Functional ability and status E.  Nutritional status F.  Physical activity G. Advance directives H. List of other physicians I.  Hospitalizations, surgeries, and ER visits in previous 12 months J.  [place] such as  hearing and vision if needed, cognitive and depression L. Referrals and appointments   In addition, I have reviewed and discussed with patient certain preventive protocols, quality metrics, and best practice recommendations. A written personalized care plan for preventive services as well as general preventive health recommendations were provided to patient.   Signed,  Clemetine Marker, LPN Nurse Health Advisor   Nurse Notes: pt doing well and appreciative of visit today

## 2020-04-14 DIAGNOSIS — D2262 Melanocytic nevi of left upper limb, including shoulder: Secondary | ICD-10-CM | POA: Diagnosis not present

## 2020-04-14 DIAGNOSIS — D2272 Melanocytic nevi of left lower limb, including hip: Secondary | ICD-10-CM | POA: Diagnosis not present

## 2020-04-14 DIAGNOSIS — Z85828 Personal history of other malignant neoplasm of skin: Secondary | ICD-10-CM | POA: Diagnosis not present

## 2020-04-14 DIAGNOSIS — D2261 Melanocytic nevi of right upper limb, including shoulder: Secondary | ICD-10-CM | POA: Diagnosis not present

## 2020-04-14 DIAGNOSIS — D2271 Melanocytic nevi of right lower limb, including hip: Secondary | ICD-10-CM | POA: Diagnosis not present

## 2020-04-20 ENCOUNTER — Other Ambulatory Visit: Payer: Self-pay

## 2020-04-20 MED ORDER — URSODIOL 300 MG PO CAPS: 300.0000 mg | ORAL_CAPSULE | Freq: Every day | ORAL | 2 refills | Status: DC

## 2020-07-08 DIAGNOSIS — N1832 Chronic kidney disease, stage 3b: Secondary | ICD-10-CM | POA: Diagnosis not present

## 2020-07-08 DIAGNOSIS — N2581 Secondary hyperparathyroidism of renal origin: Secondary | ICD-10-CM | POA: Diagnosis not present

## 2020-07-08 DIAGNOSIS — I129 Hypertensive chronic kidney disease with stage 1 through stage 4 chronic kidney disease, or unspecified chronic kidney disease: Secondary | ICD-10-CM | POA: Diagnosis not present

## 2020-07-08 DIAGNOSIS — E871 Hypo-osmolality and hyponatremia: Secondary | ICD-10-CM | POA: Diagnosis not present

## 2020-07-13 DIAGNOSIS — N1832 Chronic kidney disease, stage 3b: Secondary | ICD-10-CM | POA: Diagnosis not present

## 2020-07-13 DIAGNOSIS — I129 Hypertensive chronic kidney disease with stage 1 through stage 4 chronic kidney disease, or unspecified chronic kidney disease: Secondary | ICD-10-CM | POA: Diagnosis not present

## 2020-07-13 DIAGNOSIS — E871 Hypo-osmolality and hyponatremia: Secondary | ICD-10-CM | POA: Diagnosis not present

## 2020-07-13 DIAGNOSIS — N2581 Secondary hyperparathyroidism of renal origin: Secondary | ICD-10-CM | POA: Diagnosis not present

## 2020-08-17 ENCOUNTER — Encounter: Payer: Self-pay | Admitting: Family Medicine

## 2020-08-17 ENCOUNTER — Ambulatory Visit (INDEPENDENT_AMBULATORY_CARE_PROVIDER_SITE_OTHER): Payer: PPO | Admitting: Family Medicine

## 2020-08-17 ENCOUNTER — Other Ambulatory Visit: Payer: Self-pay

## 2020-08-17 VITALS — BP 138/82 | HR 98 | Temp 98.1°F | Resp 16 | Ht 64.0 in | Wt 153.7 lb

## 2020-08-17 DIAGNOSIS — E663 Overweight: Secondary | ICD-10-CM | POA: Diagnosis not present

## 2020-08-17 DIAGNOSIS — I779 Disorder of arteries and arterioles, unspecified: Secondary | ICD-10-CM

## 2020-08-17 DIAGNOSIS — E039 Hypothyroidism, unspecified: Secondary | ICD-10-CM

## 2020-08-17 DIAGNOSIS — Z5181 Encounter for therapeutic drug level monitoring: Secondary | ICD-10-CM | POA: Diagnosis not present

## 2020-08-17 DIAGNOSIS — E782 Mixed hyperlipidemia: Secondary | ICD-10-CM | POA: Diagnosis not present

## 2020-08-17 DIAGNOSIS — M109 Gout, unspecified: Secondary | ICD-10-CM | POA: Diagnosis not present

## 2020-08-17 DIAGNOSIS — I10 Essential (primary) hypertension: Secondary | ICD-10-CM | POA: Diagnosis not present

## 2020-08-17 DIAGNOSIS — N1832 Chronic kidney disease, stage 3b: Secondary | ICD-10-CM

## 2020-08-17 NOTE — Patient Instructions (Signed)
Start the 88 mcg levothyroxine dose   Return mid to end of January between 8 am and noon or 2pm to 4pm for your lab work to check thyroid levels  6 month routine follow up appointment - we will repeat your cholesterol in 6 months

## 2020-08-17 NOTE — Progress Notes (Signed)
Name: Caitlin Jenkins   MRN: [id number]    DOB: March 13, 1932   Date:08/17/2020       Progress Note  Chief Complaint  Patient presents with  . Hypothyroidism  . Hypertension  . Hyperlipidemia     Subjective:   Caitlin Jenkins is a 86 y.o. female, presents to clinic for routine f/up  Hypothyroidism: History: postoperative hypothyroid Current Medication Regimen: currently 88 mcg daily, changed in May from 100 mcg 4 days a week and 1/2 tab 50 mcg Tu/Th/Sat of levothyroxine  - she states she is still using up her 100 mcg tablets and hasn't gotten the 88 mcg from mail order Current Symptoms: denies weight changes, heat/cold intolerance, bowel/skin changes or CVS symptoms She reports 10 years of fatigue, no recent changes to energy  Since Sept TSH was low and med dose was reduced from 50 mcg 2x a week to 3x a week, last labs normal Current Symptoms: denies fatigue, weight changes, heat/cold intolerance, bowel/skin changes or CVS symptoms Most recent results are below Will need repeat TSH 6 weeks after changing to 88 mcg dose  Lab Results  Component Value Date   TSH 1.89 02/10/2020    Hx of gout - Lab Results  Component Value Date   LABURIC 6.1 02/10/2020  Not on meds, uses colchicine abortive with flare, no recent flares  CKD stage 3b, sees nephrology, Dr. Juleen China For QI:HKVQQ 3b chronic kidney disease ([place]) (Primary Dx);  Hyposmolality and/or hyponatremia;  Hypertensive chronic kidney disease, benign, with chronic kidney disease stage I through stage IV, or unspecified;  Hypomagnesemia;  Secondary hyperparathyroidism of renal origin ([place]) Last A&P frp, 07/13/2020 OV: 1. Chronic Kidney Disease stage IIIB: with proteinuria:  - losartan  2. Hypertension: elevated clinic encounter. However well controlled on home readings - home blood pressure monitoring.  - Current regimen of amlodipine and losartan.   3. Secondary Hyperparathyroidism: PTH, calcium and phosphorus at  goal - cholecalciferol.   Renal function October nephrology labs GFR 39 Pt continues to take losartan and amlodipine  Hypertension:  Currently managed on amlodipine 2.5 mg and losartan 100 mg Pt reports good med compliance and denies any SE.   Blood pressure today is well controlled. BP Readings from Last 3 Encounters:  08/17/20 138/82  02/27/20 (!) 141/86  02/10/20 120/80   Pt denies CP, SOB, exertional sx, LE edema, palpitation, Ha's, visual disturbances, lightheadedness, hypotension, syncope.  Hyperlipidemia: Currently treated with Atorvastatin 10 mg daily , pt reports good med compliance Last Lipids: Lab Results  Component Value Date   CHOL 152 02/10/2020   HDL 73 02/10/2020   LDLCALC 62 02/10/2020   TRIG 91 02/10/2020   CHOLHDL 2.1 02/10/2020  last labs improved from previous lipids over a year ago - Denies: Chest pain, shortness of breath, myalgias, claudication - Current Diet:  healthy - Risk factors for atherosclerosis: hypercholesterolemia and hypertension  Dexa scan due -patient still does not want to do a repeat Dexa  Current Outpatient Medications:  .  acetaminophen (TYLENOL) 325 MG tablet, Take 650 mg by mouth 2 (two) times daily., Disp: , Rfl:  .  amLODipine (NORVASC) 2.5 MG tablet, Take 1 tablet (2.5 mg total) by mouth daily., Disp: 90 tablet, Rfl: 3 .  Ascorbic Acid (VITAMIN C) 1000 MG tablet, Take 1,000 mg by mouth 2 (two) times a day., Disp: , Rfl:  .  atorvastatin (LIPITOR) 10 MG tablet, Take 1 tablet (10 mg total) by mouth at bedtime. (do not take for 3  days after taking colchicine), Disp: 90 tablet, Rfl: 3 .  Cholecalciferol (VITAMIN D-3) 25 MCG (1000 UT) CAPS, Take 1 capsule by mouth 2 (two) times a day., Disp: , Rfl:  .  COLACE 100 MG capsule, Take 100 mg by mouth daily. , Disp: , Rfl:  .  Colchicine 0.6 MG CAPS, Take by mouth., Disp: , Rfl:  .  levothyroxine (SYNTHROID) 88 MCG tablet, Take 1 tablet (88 mcg total) by mouth daily., Disp: 90 tablet,  Rfl: 3 .  losartan (COZAAR) 100 MG tablet, Take 1 tablet by mouth daily (Avoid salt substitutes, no fruit smoothies.), Disp: 90 tablet, Rfl: 3 .  Magnesium 500 MG TABS, Take by mouth 2 (two) times daily. , Disp: , Rfl:  .  meclizine (ANTIVERT) 25 MG tablet, Take by mouth 2 (two) times daily as needed. , Disp: , Rfl:  .  niacinamide 500 MG tablet, Take 1 tablet (500 mg total) by mouth 2 (two) times daily with a meal., Disp: 180 tablet, Rfl: 1 .  OVER THE COUNTER MEDICATION, Take 1 capsule by mouth daily. CBD oil (Active Gold Label), Disp: , Rfl:  .  thiamine 100 MG tablet, Take 100 mg by mouth daily., Disp: , Rfl:  .  Turmeric 450 MG CAPS, Take 500 mg by mouth daily. , Disp: , Rfl:  .  ursodiol (ACTIGALL) 300 MG capsule, Take 1 capsule (300 mg total) by mouth daily., Disp: 90 capsule, Rfl: 2 .  vitamin B-12 (CYANOCOBALAMIN) 250 MCG tablet, Take 250 mcg by mouth daily., Disp: , Rfl:   Patient Active Problem List   Diagnosis Date Noted  . Benign hypertensive kidney disease with chronic kidney disease 07/03/2019  . Hypomagnesemia 07/03/2019  . Hyposmolality and/or hyponatremia 07/03/2019  . Cellulitis 02/23/2019  . History of basal cell carcinoma (BCC) 09/12/2018  . Hyperparathyroidism, secondary renal ([place]) 07/11/2018  . Primary biliary cholangitis ([place]) 03/20/2017  . Overweight (BMI 25.0-29.9) 11/29/2015  . Carotid artery disease ([place]) 10/09/2015  . Medication monitoring encounter 10/09/2015  . Nail abnormalities 10/09/2015  . Arthralgia of multiple joints 10/09/2015  . Essential hypertension, benign 06/19/2015  . 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 Hyperlipidemia   . Hypothyroidism   . Gout   . CKD (chronic kidney disease) stage 3, GFR 30-59 ml/min (HCC)   . Hiatal hernia 06/26/2014    Past Surgical History:  Procedure Laterality Date  . ABDOMINAL HYSTERECTOMY  1995  . CATARACT EXTRACTION    . FOOT SURGERY  2000  . HEMORRHOID SURGERY    .  THYROID SURGERY  1999   para thyroid    Family History  Problem Relation Age of Onset  . Emphysema Father   . Asthma Father   . Hypertension Daughter   . Heart disease Daughter        3 stents  . Diabetes Daughter   . Hypertension Son   . Cancer Son        prostate  . Diabetes Son   . Heart disease Daughter        heart attack, 2 stents  . Multiple sclerosis Daughter   . Diabetes Daughter   . Fibromyalgia Daughter   . Diabetes Daughter    Social History   Socioeconomic History  . Marital status: Widowed    Spouse name: Joe  . Number of children: 4  . Years of education: some college  . Highest education level: 12th grade  Occupational History  . Occupation: Retired  Tobacco  Use  . Smoking status: Never Smoker  . Smokeless tobacco: Never Used  . Tobacco comment: smoking cessation materials not required  Vaping Use  . Vaping Use: Never used  Substance and Sexual Activity  . Alcohol use: No  . Drug use: No  . Sexual activity: Not Currently  Other Topics Concern  . Not on file  Social History Narrative  . Not on file   Social Determinants of Health   Financial Resource Strain: Low Risk   . Difficulty of Paying Living Expenses: Not hard at all  Food Insecurity: No Food Insecurity  . Worried About Charity fundraiser in the Last Year: Never true  . Ran Out of Food in the Last Year: Never true  Transportation Needs: No Transportation Needs  . Lack of Transportation (Medical): No  . Lack of Transportation (Non-Medical): No  Physical Activity: Inactive  . Days of Exercise per Week: 0 days  . Minutes of Exercise per Session: 0 min  Stress: No Stress Concern Present  . Feeling of Stress : Not at all  Social Connections: Moderately Isolated  . Frequency of Communication with Friends and Family: Three times a week  . Frequency of Social Gatherings with Friends and Family: Three times a week  . Attends Religious Services: More than 4 times per year  . Active  Member of Clubs or Organizations: No  . Attends Archivist Meetings: Never  . Marital Status: Widowed       Allergies  Allergen Reactions  . Indomethacin Hives  . Ace Inhibitors Swelling    Other reaction(s): SWELLING Facial swelling. Facial swelling.  . Augmentin [Amoxicillin-Pot Clavulanate] Other (See Comments)    Other reaction(s): Unknown  . Penicillins Nausea And Vomiting    Has patient had a PCN reaction causing immediate rash, facial/tongue/throat swelling, SOB or lightheadedness with hypotension: Yes Has patient had a PCN reaction causing severe rash involving mucus membranes or skin necrosis: No Has patient had a PCN reaction that required hospitalization: No Has patient had a PCN reaction occurring within the last 10 years: No If all of the above answers are "NO", then may proceed with Cephalosporin use.  Weyman Rodney Other (See Comments)    Health Maintenance  Topic Date Due  . INFLUENZA VACCINE  04/26/2020  . TETANUS/TDAP  09/26/2020 (Originally 10/03/1950)  . COVID-19 Vaccine (1) 09/26/2020 (Originally 10/03/1943)  . DEXA SCAN  02/09/2021 (Originally 10/03/2016)  . PNA vac Low Risk Adult  Completed    Chart Review Today: I personally reviewed active problem list, medication list, allergies, family history, social history, health maintenance, notes from last encounter, lab results, imaging with the patient/caregiver today.   Review of Systems  10 Systems reviewed and are negative for acute change except as noted in the HPI.  Objective:   Vitals:   08/17/20 0843  BP: 138/82  Pulse: 98  Resp: 16  Temp: 98.1 F (36.7 C)  TempSrc: Oral  SpO2: 99%  Weight: 153 lb 11.2 oz (69.7 kg)  Height: 5\' 4"  (1.626 m)    Body mass index is 26.38 kg/m.  Physical Exam Vitals and nursing note reviewed.  Constitutional:      General: She is not in acute distress.    Appearance: Normal appearance. She is not ill-appearing, toxic-appearing or diaphoretic.      Comments: Elderly female, well appearing, appears stated age, HOH  HENT:     Head: Normocephalic and atraumatic.     Right Ear: External ear normal.  Left Ear: External ear normal.  Eyes:     General:        Right eye: No discharge.        Left eye: No discharge.     Conjunctiva/sclera: Conjunctivae normal.  Cardiovascular:     Rate and Rhythm: Normal rate and regular rhythm.     Pulses: Normal pulses.     Heart sounds: Normal heart sounds.  Pulmonary:     Effort: Pulmonary effort is normal.     Breath sounds: Normal breath sounds.  Skin:    General: Skin is warm and dry.     Coloration: Skin is not jaundiced or pale.  Neurological:     Mental Status: She is alert. Mental status is at baseline.  Psychiatric:        Mood and Affect: Mood normal.        Behavior: Behavior normal.         Assessment & Plan:     ICD-10-CM   1. Hypothyroidism, unspecified type  E26.8 COMPLETE METABOLIC PANEL WITH GFR    TSH   change to 88 mcg dose daily and recheck TSH in 6 weeks - labs ordered  2. Essential hypertension, benign  T41 COMPLETE METABOLIC PANEL WITH GFR   stable, well controlled on losartan and amlodipine 2.5 mg  3. Mixed hyperlipidemia  E78.2    compliant with statin, last labs reviewed, no SE or concerns, cholesterol panel will be due in May (annually)   4. Stage 3b chronic kidney disease ([place])  N18.32    per nephrology, stable, renal function, reviewed last OV and labs from 06/2020  5. Gout, unspecified cause, unspecified chronicity, unspecified site  M10.9    reviewed last labs, not on meds, uric acid nearly at goal, no recent flares, well controlled with diet  6. Bilateral carotid artery disease, unspecified type (HCC)  I77.9    on statin, monitoring  7. Overweight (BMI 25.0-29.9)  E66.3    weight stable, pt appears healthy   8. Encounter for medication monitoring  D62.22 COMPLETE METABOLIC PANEL WITH GFR     Labs due 6-8 weeks after starting 88 mcg levothyroxine  dose (mid to late Jan) Routine f/up in 6 months  Delsa Grana, PA-C 08/17/20 8:56 AM

## 2020-08-24 ENCOUNTER — Other Ambulatory Visit: Payer: Self-pay | Admitting: Family Medicine

## 2020-08-24 DIAGNOSIS — E039 Hypothyroidism, unspecified: Secondary | ICD-10-CM

## 2020-08-24 MED ORDER — LEVOTHYROXINE SODIUM 88 MCG PO TABS: 88.0000 ug | ORAL_TABLET | Freq: Every day | ORAL | 1 refills | Status: DC

## 2020-08-24 NOTE — Telephone Encounter (Signed)
Medication Refill - Medication: levothyroxine (SYNTHROID) 88 MCG tablet Pt is getting messages from pharmacy that she needs a new Rx   Has the patient contacted their pharmacy? Yes.   (Agent: If no, request that the patient contact the pharmacy for the refill.) (Agent: If yes, when and what did the pharmacy advise?)  Preferred Pharmacy (with phone number or street name):  [facility]) - [place], [place] Phone:  [phone]  Fax:  [phone]       Agent: Please be advised that RX refills may take up to 3 business days. We ask that you follow-up with your pharmacy.

## 2020-09-02 DIAGNOSIS — M1711 Unilateral primary osteoarthritis, right knee: Secondary | ICD-10-CM | POA: Diagnosis not present

## 2020-10-30 DIAGNOSIS — E039 Hypothyroidism, unspecified: Secondary | ICD-10-CM | POA: Diagnosis not present

## 2020-10-30 DIAGNOSIS — I10 Essential (primary) hypertension: Secondary | ICD-10-CM | POA: Diagnosis not present

## 2020-10-30 DIAGNOSIS — Z5181 Encounter for therapeutic drug level monitoring: Secondary | ICD-10-CM | POA: Diagnosis not present

## 2020-10-31 LAB — COMPLETE METABOLIC PANEL WITH GFR
AG Ratio: 1.4 (calc) (ref 1.0–2.5)
ALT: 9 U/L (ref 6–29)
AST: 22 U/L (ref 10–35)
Albumin: 4.2 g/dL (ref 3.6–5.1)
Alkaline phosphatase (APISO): 82 U/L (ref 37–153)
BUN/Creatinine Ratio: 19 (calc) (ref 6–22)
BUN: 31 mg/dL — ABNORMAL HIGH (ref 7–25)
CO2: 26 mmol/L (ref 20–32)
Calcium: 10.4 mg/dL (ref 8.6–10.4)
Chloride: 100 mmol/L (ref 98–110)
Creat: 1.66 mg/dL — ABNORMAL HIGH (ref 0.60–0.88)
GFR, Est African American: 32 mL/min/{1.73_m2} — ABNORMAL LOW (ref >=60)
GFR, Est Non African American: 27 mL/min/{1.73_m2} — ABNORMAL LOW (ref >=60)
Globulin: 3 g/dL (calc) (ref 1.9–3.7)
Glucose, Bld: 83 mg/dL (ref 65–99)
Potassium: 5.3 mmol/L (ref 3.5–5.3)
Sodium: 135 mmol/L (ref 135–146)
Total Bilirubin: 0.5 mg/dL (ref 0.2–1.2)
Total Protein: 7.2 g/dL (ref 6.1–8.1)

## 2020-10-31 LAB — TSH: TSH: 0.46 mIU/L (ref 0.40–4.50)

## 2020-11-18 ENCOUNTER — Other Ambulatory Visit: Payer: Self-pay

## 2020-11-18 MED ORDER — URSODIOL 300 MG PO CAPS: 300.0000 mg | ORAL_CAPSULE | Freq: Every day | ORAL | 3 refills | Status: DC

## 2020-12-24 DIAGNOSIS — K529 Noninfective gastroenteritis and colitis, unspecified: Secondary | ICD-10-CM | POA: Diagnosis not present

## 2020-12-25 DIAGNOSIS — K7460 Unspecified cirrhosis of liver: Secondary | ICD-10-CM | POA: Diagnosis not present

## 2020-12-25 DIAGNOSIS — K529 Noninfective gastroenteritis and colitis, unspecified: Secondary | ICD-10-CM | POA: Diagnosis not present

## 2021-01-05 ENCOUNTER — Other Ambulatory Visit: Payer: Self-pay

## 2021-01-05 DIAGNOSIS — E039 Hypothyroidism, unspecified: Secondary | ICD-10-CM

## 2021-01-05 MED ORDER — LEVOTHYROXINE SODIUM 88 MCG PO TABS
88.0000 ug | ORAL_TABLET | Freq: Every day | ORAL | 1 refills | Status: DC
Start: 2021-01-05 — End: 2021-02-18

## 2021-01-13 DIAGNOSIS — D0421 Carcinoma in situ of skin of right ear and external auricular canal: Secondary | ICD-10-CM | POA: Diagnosis not present

## 2021-01-13 DIAGNOSIS — L821 Other seborrheic keratosis: Secondary | ICD-10-CM | POA: Diagnosis not present

## 2021-01-13 DIAGNOSIS — D485 Neoplasm of uncertain behavior of skin: Secondary | ICD-10-CM | POA: Diagnosis not present

## 2021-01-13 DIAGNOSIS — H61031 Chondritis of right external ear: Secondary | ICD-10-CM | POA: Diagnosis not present

## 2021-01-13 DIAGNOSIS — D2271 Melanocytic nevi of right lower limb, including hip: Secondary | ICD-10-CM | POA: Diagnosis not present

## 2021-01-13 DIAGNOSIS — D2261 Melanocytic nevi of right upper limb, including shoulder: Secondary | ICD-10-CM | POA: Diagnosis not present

## 2021-01-13 DIAGNOSIS — D2262 Melanocytic nevi of left upper limb, including shoulder: Secondary | ICD-10-CM | POA: Diagnosis not present

## 2021-01-13 DIAGNOSIS — D2272 Melanocytic nevi of left lower limb, including hip: Secondary | ICD-10-CM | POA: Diagnosis not present

## 2021-01-13 DIAGNOSIS — L570 Actinic keratosis: Secondary | ICD-10-CM | POA: Diagnosis not present

## 2021-01-13 DIAGNOSIS — D225 Melanocytic nevi of trunk: Secondary | ICD-10-CM | POA: Diagnosis not present

## 2021-01-13 DIAGNOSIS — X32XXXA Exposure to sunlight, initial encounter: Secondary | ICD-10-CM | POA: Diagnosis not present

## 2021-01-14 DIAGNOSIS — E871 Hypo-osmolality and hyponatremia: Secondary | ICD-10-CM | POA: Diagnosis not present

## 2021-01-14 DIAGNOSIS — I129 Hypertensive chronic kidney disease with stage 1 through stage 4 chronic kidney disease, or unspecified chronic kidney disease: Secondary | ICD-10-CM | POA: Diagnosis not present

## 2021-01-14 DIAGNOSIS — N2581 Secondary hyperparathyroidism of renal origin: Secondary | ICD-10-CM | POA: Diagnosis not present

## 2021-01-14 DIAGNOSIS — N1832 Chronic kidney disease, stage 3b: Secondary | ICD-10-CM | POA: Diagnosis not present

## 2021-01-18 DIAGNOSIS — E871 Hypo-osmolality and hyponatremia: Secondary | ICD-10-CM | POA: Diagnosis not present

## 2021-01-18 DIAGNOSIS — N2581 Secondary hyperparathyroidism of renal origin: Secondary | ICD-10-CM | POA: Diagnosis not present

## 2021-01-18 DIAGNOSIS — I129 Hypertensive chronic kidney disease with stage 1 through stage 4 chronic kidney disease, or unspecified chronic kidney disease: Secondary | ICD-10-CM | POA: Diagnosis not present

## 2021-01-18 DIAGNOSIS — N1832 Chronic kidney disease, stage 3b: Secondary | ICD-10-CM | POA: Diagnosis not present

## 2021-01-31 DIAGNOSIS — N390 Urinary tract infection, site not specified: Secondary | ICD-10-CM | POA: Diagnosis not present

## 2021-01-31 DIAGNOSIS — I10 Essential (primary) hypertension: Secondary | ICD-10-CM | POA: Diagnosis not present

## 2021-02-04 DIAGNOSIS — M79662 Pain in left lower leg: Secondary | ICD-10-CM | POA: Diagnosis not present

## 2021-02-04 DIAGNOSIS — M79661 Pain in right lower leg: Secondary | ICD-10-CM | POA: Diagnosis not present

## 2021-02-09 ENCOUNTER — Telehealth: Payer: Self-pay | Admitting: Gastroenterology

## 2021-02-09 NOTE — Telephone Encounter (Signed)
ursodiol (ACTIGALL) 300 MG capsule  [facility]  90 days

## 2021-02-10 ENCOUNTER — Other Ambulatory Visit: Payer: Self-pay

## 2021-02-10 MED ORDER — URSODIOL 300 MG PO CAPS: 300.0000 mg | ORAL_CAPSULE | Freq: Every day | ORAL | 3 refills | Status: DC

## 2021-02-10 NOTE — Telephone Encounter (Signed)
Prescription for ursodiol has been sent to [facility] mail order pharmacy per pt request.

## 2021-02-11 DIAGNOSIS — D0421 Carcinoma in situ of skin of right ear and external auricular canal: Secondary | ICD-10-CM | POA: Diagnosis not present

## 2021-02-15 ENCOUNTER — Ambulatory Visit: Payer: PPO | Admitting: Family Medicine

## 2021-02-15 DIAGNOSIS — Z881 Allergy status to other antibiotic agents status: Secondary | ICD-10-CM | POA: Diagnosis not present

## 2021-02-15 DIAGNOSIS — Z8739 Personal history of other diseases of the musculoskeletal system and connective tissue: Secondary | ICD-10-CM | POA: Diagnosis not present

## 2021-02-15 DIAGNOSIS — Z87440 Personal history of urinary (tract) infections: Secondary | ICD-10-CM | POA: Diagnosis not present

## 2021-02-18 ENCOUNTER — Encounter: Payer: Self-pay | Admitting: Unknown Physician Specialty

## 2021-02-18 ENCOUNTER — Ambulatory Visit (INDEPENDENT_AMBULATORY_CARE_PROVIDER_SITE_OTHER): Payer: PPO | Admitting: Unknown Physician Specialty

## 2021-02-18 ENCOUNTER — Other Ambulatory Visit: Payer: Self-pay

## 2021-02-18 VITALS — BP 132/78 | HR 72 | Temp 97.7°F | Resp 16 | Ht 64.0 in | Wt 149.4 lb

## 2021-02-18 DIAGNOSIS — M6281 Muscle weakness (generalized): Secondary | ICD-10-CM | POA: Diagnosis not present

## 2021-02-18 DIAGNOSIS — E890 Postprocedural hypothyroidism: Secondary | ICD-10-CM

## 2021-02-18 DIAGNOSIS — N1832 Chronic kidney disease, stage 3b: Secondary | ICD-10-CM | POA: Diagnosis not present

## 2021-02-18 DIAGNOSIS — I10 Essential (primary) hypertension: Secondary | ICD-10-CM | POA: Diagnosis not present

## 2021-02-18 DIAGNOSIS — E782 Mixed hyperlipidemia: Secondary | ICD-10-CM

## 2021-02-18 DIAGNOSIS — E039 Hypothyroidism, unspecified: Secondary | ICD-10-CM

## 2021-02-18 DIAGNOSIS — I779 Disorder of arteries and arterioles, unspecified: Secondary | ICD-10-CM

## 2021-02-18 MED ORDER — AMLODIPINE BESYLATE 2.5 MG PO TABS: 2.5000 mg | ORAL_TABLET | Freq: Every day | ORAL | 3 refills | Status: DC

## 2021-02-18 MED ORDER — LEVOTHYROXINE SODIUM 88 MCG PO TABS: 88.0000 ug | ORAL_TABLET | Freq: Every day | ORAL | 1 refills | Status: DC

## 2021-02-18 MED ORDER — COLCHICINE 0.6 MG PO CAPS: 30.0000 [IU] | ORAL_CAPSULE | Freq: Every day | ORAL | 0 refills | Status: DC | PRN

## 2021-02-18 MED ORDER — ATORVASTATIN CALCIUM 10 MG PO TABS: 10.0000 mg | ORAL_TABLET | Freq: Every day | ORAL | 3 refills | Status: DC

## 2021-02-18 MED ORDER — LOSARTAN POTASSIUM 100 MG PO TABS: ORAL_TABLET | ORAL | 3 refills | Status: DC

## 2021-02-18 NOTE — Assessment & Plan Note (Signed)
Stable.  Following with nerurology

## 2021-02-18 NOTE — Assessment & Plan Note (Signed)
Check lipids today.  Refill medications

## 2021-02-18 NOTE — Assessment & Plan Note (Signed)
Recheck today.  Some recent weight loss

## 2021-02-18 NOTE — Progress Notes (Signed)
BP 132/78   Pulse 72   Temp 97.7 F (36.5 C) (Oral)   Resp 16   Ht 5' 4"  (1.626 m)   Wt 149 lb 6.4 oz (67.8 kg)   SpO2 96%   BMI 25.64 kg/m    Subjective:    Patient ID: Caitlin Jenkins, female    DOB: 04/24/1932, 87 y.o.   MRN: [id number]  HPI: Caitlin Jenkins is a 87 y.o. female  Chief Complaint  Patient presents with  . Hypothyroidism  . Hypertension  . Hyperlipidemia    6 month follow up    Hypertension Using medications without difficulty Average home BPs  Below 140/80   No problems or lightheadedness No chest pain with exertion or shortness of breath No Edema except for trouble   CKD Stable.  Following with Nephrology  Leg weakness Secondary to treatment with Levothyroxine following tx for a UTI after home visit with [facility]   Hyperlipidemia Using medications without problems: No Muscle aches  Diet compliance: good diet Exercise: good diet  Hypothyroid Some recent weight loss.  Last TSH was OK but unclear to her dose.  Currenly taking 88 mcgs/day  Gout Very occasional flares. Resolves with the very occasional Colchicine  Depression screen [facility] 2/9 02/18/2021 08/17/2020 02/27/2020 02/10/2020 10/08/2019  Decreased Interest 0 0 0 0 0  Down, Depressed, Hopeless 0 0 0 0 0  PHQ - 2 Score 0 0 0 0 0  Altered sleeping - - - 0 0  Tired, decreased energy - - - 0 0  Change in appetite - - - 0 0  Feeling bad or failure about yourself  - - - 0 0  Trouble concentrating - - - 0 0  Moving slowly or fidgety/restless - - - 0 0  Suicidal thoughts - - - 0 0  PHQ-9 Score - - - 0 0  Difficult doing work/chores - - - Not difficult at all Not difficult at all  Some recent data might be hidden     Relevant past medical, surgical, family and social history reviewed and updated as indicated. Interim medical history since our last visit reviewed. Allergies and medications reviewed and updated.  Review of Systems  Constitutional: Positive for fatigue.  HENT:  Negative.   Respiratory: Negative.   Cardiovascular: Negative.   Neurological: Positive for weakness.    Per HPI unless specifically indicated above     Objective:    BP 132/78   Pulse 72   Temp 97.7 F (36.5 C) (Oral)   Resp 16   Ht 5' 4"  (1.626 m)   Wt 149 lb 6.4 oz (67.8 kg)   SpO2 96%   BMI 25.64 kg/m   Wt Readings from Last 3 Encounters:  02/18/21 149 lb 6.4 oz (67.8 kg)  08/17/20 153 lb 11.2 oz (69.7 kg)  02/27/20 159 lb 1.6 oz (72.2 kg)    Physical Exam Constitutional:      General: She is not in acute distress.    Appearance: Normal appearance. She is well-developed.  HENT:     Head: Normocephalic and atraumatic.  Eyes:     General: Lids are normal. No scleral icterus.       Right eye: No discharge.        Left eye: No discharge.     Conjunctiva/sclera: Conjunctivae normal.  Neck:     Vascular: No carotid bruit or JVD.  Cardiovascular:     Rate and Rhythm: Normal rate and regular rhythm.  Heart sounds: Normal heart sounds.  Pulmonary:     Effort: Pulmonary effort is normal.     Breath sounds: Normal breath sounds.  Abdominal:     Palpations: There is no hepatomegaly or splenomegaly.  Musculoskeletal:        General: Normal range of motion.     Cervical back: Normal range of motion and neck supple.  Skin:    General: Skin is warm and dry.     Coloration: Skin is not pale.     Findings: No rash.     Comments: Bruising lower legs from tendinopathy following Levothyroxine  Neurological:     Mental Status: She is alert and oriented to person, place, and time.  Psychiatric:        Behavior: Behavior normal.        Thought Content: Thought content normal.        Judgment: Judgment normal.     Results for orders placed or performed in visit on 08/17/20  COMPLETE METABOLIC PANEL WITH GFR  Result Value Ref Range   Glucose, Bld 83 65 - 99 mg/dL   BUN 31 (H) 7 - 25 mg/dL   Creat 1.66 (H) 0.60 - 0.88 mg/dL   GFR, Est Non African American 27 (L) > OR  = 60 mL/min/1.58m   GFR, Est African American 32 (L) > OR = 60 mL/min/1.730m  BUN/Creatinine Ratio 19 6 - 22 (calc)   Sodium 135 135 - 146 mmol/L   Potassium 5.3 3.5 - 5.3 mmol/L   Chloride 100 98 - 110 mmol/L   CO2 26 20 - 32 mmol/L   Calcium 10.4 8.6 - 10.4 mg/dL   Total Protein 7.2 6.1 - 8.1 g/dL   Albumin 4.2 3.6 - 5.1 g/dL   Globulin 3.0 1.9 - 3.7 g/dL (calc)   AG Ratio 1.4 1.0 - 2.5 (calc)   Total Bilirubin 0.5 0.2 - 1.2 mg/dL   Alkaline phosphatase (APISO) 82 37 - 153 U/L   AST 22 10 - 35 U/L   ALT 9 6 - 29 U/L  TSH  Result Value Ref Range   TSH 0.46 0.40 - 4.50 mIU/L      Assessment & Plan:   Problem List Items Addressed This Visit      Unprioritized   Carotid artery disease (HCC)   Relevant Medications   amLODipine (NORVASC) 2.5 MG tablet   atorvastatin (LIPITOR) 10 MG tablet   losartan (COZAAR) 100 MG tablet   CKD (chronic kidney disease) stage 3, GFR 30-59 ml/min (HCC) (Chronic)    Stable.  Following with nerurology      Essential hypertension, benign (Chronic)    Stable, continue present medications.        Relevant Medications   amLODipine (NORVASC) 2.5 MG tablet   atorvastatin (LIPITOR) 10 MG tablet   losartan (COZAAR) 100 MG tablet   Other Relevant Orders   Comprehensive metabolic panel   CBC with Differential/Platelet   Lipid panel   C-reactive protein   TSH   Sed Rate (ESR)   Ambulatory referral to Physical Therapy   Hyperlipidemia (Chronic)    Check lipids today.  Refill medications      Relevant Medications   amLODipine (NORVASC) 2.5 MG tablet   atorvastatin (LIPITOR) 10 MG tablet   losartan (COZAAR) 100 MG tablet   Other Relevant Orders   Comprehensive metabolic panel   CBC with Differential/Platelet   Lipid panel   C-reactive protein   TSH   Sed Rate (ESR)  Ambulatory referral to Physical Therapy   Hypothyroidism (Chronic)    Recheck today.  Some recent weight loss      Relevant Medications   levothyroxine (SYNTHROID) 88  MCG tablet   Other Relevant Orders   Comprehensive metabolic panel   CBC with Differential/Platelet   Lipid panel   C-reactive protein   TSH   Sed Rate (ESR)   Ambulatory referral to Physical Therapy    Other Visit Diagnoses    Muscle weakness    -  Primary   New problem secondary to Levofloxin.  Refer to PT for strengthening   Relevant Orders   Comprehensive metabolic panel   CBC with Differential/Platelet   Lipid panel   C-reactive protein   TSH   Sed Rate (ESR)   Ambulatory referral to Physical Therapy       Follow up plan: Return in about 4 weeks (around 03/18/2021).

## 2021-02-18 NOTE — Assessment & Plan Note (Signed)
Stable, continue present medications.   

## 2021-02-19 ENCOUNTER — Encounter: Payer: Self-pay | Admitting: Unknown Physician Specialty

## 2021-02-19 DIAGNOSIS — E039 Hypothyroidism, unspecified: Secondary | ICD-10-CM

## 2021-02-19 LAB — COMPREHENSIVE METABOLIC PANEL WITH GFR
AG Ratio: 1.5 (calc) (ref 1.0–2.5)
ALT: 9 U/L (ref 6–29)
AST: 17 U/L (ref 10–35)
Albumin: 4.2 g/dL (ref 3.6–5.1)
Alkaline phosphatase (APISO): 103 U/L (ref 37–153)
BUN/Creatinine Ratio: 26 (calc) — ABNORMAL HIGH (ref 6–22)
BUN: 29 mg/dL — ABNORMAL HIGH (ref 7–25)
CO2: 23 mmol/L (ref 20–32)
Calcium: 10 mg/dL (ref 8.6–10.4)
Chloride: 108 mmol/L (ref 98–110)
Creat: 1.12 mg/dL — ABNORMAL HIGH (ref 0.60–0.88)
Globulin: 2.8 g/dL (ref 1.9–3.7)
Glucose, Bld: 95 mg/dL (ref 65–99)
Potassium: 4.9 mmol/L (ref 3.5–5.3)
Sodium: 139 mmol/L (ref 135–146)
Total Bilirubin: 0.6 mg/dL (ref 0.2–1.2)
Total Protein: 7 g/dL (ref 6.1–8.1)

## 2021-02-19 LAB — CBC WITH DIFFERENTIAL/PLATELET
Absolute Monocytes: 730 {cells}/uL (ref 200–950)
Basophils Absolute: 16 {cells}/uL (ref 0–200)
Basophils Relative: 0.2 %
Eosinophils Absolute: 246 {cells}/uL (ref 15–500)
Eosinophils Relative: 3 %
HCT: 36.3 % (ref 35.0–45.0)
Hemoglobin: 11.9 g/dL (ref 11.7–15.5)
Lymphs Abs: 2739 {cells}/uL (ref 850–3900)
MCH: 29.7 pg (ref 27.0–33.0)
MCHC: 32.8 g/dL (ref 32.0–36.0)
MCV: 90.5 fL (ref 80.0–100.0)
MPV: 9.1 fL (ref 7.5–12.5)
Monocytes Relative: 8.9 %
Neutro Abs: 4469 {cells}/uL (ref 1500–7800)
Neutrophils Relative %: 54.5 %
Platelets: 224 Thousand/uL (ref 140–400)
RBC: 4.01 Million/uL (ref 3.80–5.10)
RDW: 13.5 % (ref 11.0–15.0)
Total Lymphocyte: 33.4 %
WBC: 8.2 Thousand/uL (ref 3.8–10.8)

## 2021-02-19 LAB — LIPID PANEL
Cholesterol: 133 mg/dL
HDL: 67 mg/dL
LDL Cholesterol (Calc): 50 mg/dL
Non-HDL Cholesterol (Calc): 66 mg/dL
Total CHOL/HDL Ratio: 2 (calc)
Triglycerides: 81 mg/dL

## 2021-02-19 LAB — C-REACTIVE PROTEIN: CRP: 6.1 mg/L

## 2021-02-19 LAB — TSH: TSH: 0.02 m[IU]/L — ABNORMAL LOW (ref 0.40–4.50)

## 2021-02-19 LAB — SEDIMENTATION RATE: Sed Rate: 14 mm/h (ref 0–30)

## 2021-02-24 ENCOUNTER — Other Ambulatory Visit: Payer: Self-pay | Admitting: Unknown Physician Specialty

## 2021-02-24 DIAGNOSIS — E039 Hypothyroidism, unspecified: Secondary | ICD-10-CM

## 2021-02-24 MED ORDER — LEVOTHYROXINE SODIUM 75 MCG PO TABS: 75.0000 ug | ORAL_TABLET | Freq: Every day | ORAL | 0 refills | Status: DC

## 2021-02-25 ENCOUNTER — Telehealth: Payer: Self-pay | Admitting: Family Medicine

## 2021-02-25 MED ORDER — COLCHICINE 0.6 MG PO CAPS: 0.6000 mg | ORAL_CAPSULE | Freq: Every day | ORAL | 0 refills | Status: DC | PRN

## 2021-02-25 NOTE — Telephone Encounter (Signed)
Caitlin Jenkins from the [facility] called in and stated that the medication that was sent over for Colchicine 0.6 MG CAPS, she states that is says take 30 units daily, Caitlin Jenkins called to get clarification if that's what it supposed to be or what should the instruction be. Caitlin Jenkins also states a new prescription is not needed to be sent over, just to have someone call and verify what it should be.    Caitlin Jenkins: [place], [place] - [place] Phone:  [phone]  Fax:  [phone]

## 2021-03-01 ENCOUNTER — Encounter: Payer: Self-pay | Admitting: Physical Therapy

## 2021-03-01 ENCOUNTER — Other Ambulatory Visit: Payer: Self-pay

## 2021-03-01 ENCOUNTER — Ambulatory Visit: Payer: PPO | Attending: Unknown Physician Specialty | Admitting: Physical Therapy

## 2021-03-01 DIAGNOSIS — M6281 Muscle weakness (generalized): Secondary | ICD-10-CM | POA: Diagnosis not present

## 2021-03-01 NOTE — Therapy (Signed)
[facility] [address] [place], [address] Phone: [phone]   Fax:  [phone]  Physical Therapy Evaluation  Patient Details  Name: Caitlin Jenkins MRN: [id number] Date of Birth: 04/26/1932 No data recorded  Encounter Date: 03/01/2021   PT End of Session - 03/01/21 1004    Visit Number 1    Number of Visits 17    Date for PT Re-Evaluation 04/30/21    PT Start Time 0945    PT Stop Time 1025    PT Time Calculation (min) 40 min    Activity Tolerance Patient tolerated treatment well    Behavior During Therapy [facility] for tasks assessed/performed           Past Medical History:  Diagnosis Date  . Biliary cirrhosis ([place])   . Cancer of skin of leg   . Cellulitis   . CKD (chronic kidney disease) stage 3, GFR 30-59 ml/min (HCC)   . Gout   . Hiatal hernia Oct 2015   8.5cm  . Hiatal hernia 06/26/2014   8.5cm   . History of basal cell carcinoma (BCC) 09/12/2018   Oct 2016; Moh's surgery; right antitragus  . History of diverticulitis 2000  . Hyperlipidemia   . Hyperparathyroidism, secondary renal ([place]) 07/11/2018   Managed by nephrologist  . Hypertension   . Hypomagnesemia 07/03/2019  . Hypothyroidism   . Skin cancer of nose     Past Surgical History:  Procedure Laterality Date  . ABDOMINAL HYSTERECTOMY  1995  . CATARACT EXTRACTION    . FOOT SURGERY  2000  . HEMORRHOID SURGERY    . THYROID SURGERY  1999   para thyroid    There were no vitals filed for this visit.    Subjective Assessment - 03/01/21 0948    Pertinent History Pt is an 87 year old female reporting generalized weakness. Reports her leg weakness has been bothering her for a while, but after she took medication for UTI on mother's day weekend it has been worse. Patient reports difficulty with stair ambulation and transferring STS d/t leg weakness. She has 4 steps with bilat handrails to enter home (cannot touch both). She reports  going down steps backward and pulls up onto rail. Has a basement with her washroom, has a chair lift for this set of steps. Lives at home alone, has someone that helps with her cleaning, and yardwork. Ambulates without AD, but reports she uses cart heavily for support when out and about. She enjoys working out in her rose garden and occassionally sits on a stool and pulls weeds.She drives and completes all community errands independently, is able to get her groceries from her car and carry them into the house with seated rest breaks. Denies having pain. Pt denies N/V, new B&B changes, unexplained weight fluctuation, saddle paresthesia, fever, night sweats, or unrelenting night pain at this time. Patinet reports loss of bladder over the past year, when she gets up in the middle of the night, endorses "knot in her R lower abdomen that comes and goes". No numbness, tingling, lower extremity sensation changes. No falls in the past 6 months though she is afraid of falling.    Limitations Lifting;House hold activities;Walking    How long can you sit comfortably? unlimited    How long can you stand comfortably? unlimited    How long can you walk comfortably? 25mins    Diagnostic tests none to date    Patient Stated Goals  Make my legs stronger    Currently in Pain? No/denies               OBJECTIVE  MUSCULOSKELETAL: Tremor: Absent Bulk: Normal Tone: Normal, no clonus  Posture Increased thoracic kyphosis, decreased lumbar lordosis, upper crossed  Gait With R trunk lean throughout  Strength R/L 4-/4- Hip flexion 5/5 Hip external rotation 5/5 Hip internal rotation Unable to stand without UE support Hip extension  3+/3+ Hip abduction 5/5 Hip adduction 4/4 Knee extension 4/4 Knee flexion  NEUROLOGICAL:  Mental Status Patient is oriented to person, place and time.  Recent memory is intact.  Remote memory is intact.  Attention span and concentration are intact.  Expressive speech is  intact.  Patient's fund of knowledge is within normal limits for educational level.  Sensation Grossly intact to light touch bilateral UEs/LEs as determined by testing dermatomes C2-T2/L2-S2 respectively Proprioception and hot/cold testing deferred on this date  Functional Movement: Stairs: Step to leading with L; backward stepping with reciprocal pattern STS: heavy use of UEs for stand, with UEs for control of descent Squat to minimal depth d/t fear of falling and decreased strength   FUNCTIONAL OUTCOME MEASURES   Results Comments  SLS R: 15sec L: 6sec   TUG 8.52seconds   5TSTS 16seconds   10 Meter Gait Speed Self-selected: 0.10m/s; Fastest: 1.64m/s Below normative values for full community ambulation  ABC Scale %      Ther-Ex PT reviewed the following HEP with patient with patient able to demonstrate a set of the following with min cuing for correction needed. PT educated patient on parameters of therex (how/when to inc/decrease intensity, frequency, rep/set range, stretch hold time, and purpose of therex) with verbalized understanding.  STS with pillow in chair without UE 3x 10  Standing hip abd 3x 10                       Objective measurements completed on examination: See above findings.               PT Education - 03/01/21 [phone]    Education Details Patient was educated on diagnosis, anatomy and pathology involved, prognosis, role of PT, and was given an HEP, demonstrating exercise with proper form following verbal and tactile cues, and was given a paper hand out to continue exercise at home. Pt was educated on and agreed to plan of care.    Person(s) Educated Patient    Methods Explanation;Demonstration;Verbal cues    Comprehension Verbalized understanding;Returned demonstration;Verbal cues required            PT Short Term Goals - 03/01/21 1259      PT SHORT TERM GOAL #1   Title Pt will be independent with HEP in order to improve  strength and balance in order to decrease fall risk and improve function at home and work.    Baseline 03/01/21 HEP given    Time 4    Period Weeks    Status New             PT Long Term Goals - 03/01/21 1302      PT LONG TERM GOAL #1   Title Pt will decrease 5TSTS to at least 12 seconds in order to demonstrate clinically significant improvement in LE strength and age matched norms    Baseline 03/01/21 16sec    Time 8    Period Weeks    Status New      PT LONG TERM  GOAL #2   Title Patient will increase FOTO score to 60 to demonstrate predicted increase in functional mobility to complete ADLs    Baseline 03/01/21 40    Time 8    Period Weeks    Status New      PT LONG TERM GOAL #3   Title Pt will demonstrate reciprcol stair negotiation with 10# weight and unilateral HHA to demonstrate safety carrying groceries/items into home    Baseline 03/01/21 step to pattern ascent, backward descent with heavy UE use    Time 8    Period Weeks    Status New      PT LONG TERM GOAL #4   Title Pt will improve ABC by at least 13% in order to demonstrate clinically significant improvement in balance confidence.    Baseline 03/01/21 42.8%    Time 8    Period Weeks    Status New                  Plan - 03/01/21 1237    Clinical Impression Statement Pt is an 87 year old female presenting with general LE weakness. Impairments in creased static balance, decreased hip and core strength, abnormal gait, decreased hip ext ROM, and decreased endurance. Activity limitations in ind transfers, stair ambulation, squatting, carrying and lifting; inhibiting pain free ADLs and community activity. Pt will benefit from skilled PT to address aforementioned impairments to return to optimal PLOF    Personal Factors and Comorbidities Age;Comorbidity 1;Comorbidity 2    Comorbidities HLD, hypothyroidism,    Examination-Activity Limitations Lift;Carry;Stairs;Stand;Transfers;Squat    Examination-Participation  Restrictions Community Activity;Yard Work;Cleaning    Stability/Clinical Decision Making Evolving/Moderate complexity    Clinical Decision Making Moderate    Rehab Potential Good    PT Frequency 2x / week    PT Duration 8 weeks    PT Treatment/Interventions Balance training;Patient/family education;Functional mobility training;Iontophoresis 4mg /ml Dexamethasone;ADLs/Self Care Home Management;Cryotherapy;Electrical Stimulation;Ultrasound;Moist Heat;Gait training;Stair training;Therapeutic exercise;Therapeutic activities;Neuromuscular re-education;DME Instruction;Manual techniques;Passive range of motion;Dry needling;Joint Manipulations;Spinal Manipulations    PT Next Visit Plan 6MWT, FGA?,    PT Home Exercise Plan mini squat, standing hip abd    Consulted and Agree with Plan of Care Patient           Patient will benefit from skilled therapeutic intervention in order to improve the following deficits and impairments:  Pain,Postural dysfunction,Impaired flexibility,Increased fascial restricitons,Decreased strength,Decreased activity tolerance,Decreased coordination,Abnormal gait,Decreased balance,Decreased endurance,Decreased mobility,Improper body mechanics,Decreased range of motion,Difficulty walking,Increased muscle spasms  Visit Diagnosis: Muscle weakness (generalized)     Problem List Patient Active Problem List   Diagnosis Date Noted  . History of basal cell carcinoma (BCC) 09/12/2018  . Hyperparathyroidism, secondary renal ([place]) 07/11/2018  . Primary biliary cholangitis ([place]) 03/20/2017  . Overweight (BMI 25.0-29.9) 11/29/2015  . Carotid artery disease ([place]) 10/09/2015  . Nail abnormalities 10/09/2015  . Arthralgia of multiple joints 10/09/2015  . Essential hypertension, benign 06/19/2015  . 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 Hyperlipidemia   . Hypothyroidism   . Gout   . CKD (chronic kidney disease) stage 3, GFR 30-59 ml/min ([place])     Durwin Reges DPT Durwin Reges 03/01/2021, 1:17 PM  [facility] [address], [place], [address] Phone: [phone]   Fax:  [phone]  Name: Caitlin Jenkins MRN: [id number] Date of Birth: 1932-05-23

## 2021-03-02 ENCOUNTER — Ambulatory Visit (INDEPENDENT_AMBULATORY_CARE_PROVIDER_SITE_OTHER): Payer: PPO

## 2021-03-02 ENCOUNTER — Other Ambulatory Visit: Payer: Self-pay

## 2021-03-02 VITALS — BP 150/84 | HR 87 | Temp 97.9°F | Resp 16 | Ht 64.0 in | Wt 148.4 lb

## 2021-03-02 DIAGNOSIS — Z0000 Encounter for general adult medical examination without abnormal findings: Secondary | ICD-10-CM

## 2021-03-02 NOTE — Progress Notes (Signed)
Subjective:   Caitlin Jenkins is a 87 y.o. female who presents for Medicare Annual (Subsequent) preventive examination.  Review of Systems     Cardiac Risk Factors include: advanced age (>103men, >46 women);hypertension;dyslipidemia     Objective:    Today's Vitals   03/02/21 1332  BP: (!) 150/84  Pulse: 87  Resp: 16  Temp: 97.9 F (36.6 C)  TempSrc: Oral  SpO2: 99%  Weight: 148 lb 6.4 oz (67.3 kg)  Height: 5\' 4"  (1.626 m)   Body mass index is 25.47 kg/m.  Advanced Directives 03/02/2021 02/27/2020 03/05/2019 02/23/2019 02/23/2019 05/11/2018 05/11/2018  Does Patient Have a Medical Advance Directive? Yes Yes Yes Yes Yes Yes Yes  Type of Paramedic of [place];Living will [place];Living will [place];Living will [place];Living will [place];Living will Living will;Healthcare Power of Attorney -  Does patient want to make changes to medical advance directive? - - - No - Patient declined No - Patient declined No - Patient declined -  Copy of [place] in Chart? Yes - validated most recent copy scanned in chart (See row information) Yes - validated most recent copy scanned in chart (See row information) Yes - validated most recent copy scanned in chart (See row information) No - copy requested No - copy requested No - copy requested -  Would patient like information on creating a medical advance directive? - - - - - - No - Patient declined    Current Medications (verified) Outpatient Encounter Medications as of 03/02/2021  Medication Sig  . acetaminophen (TYLENOL) 325 MG tablet Take 650 mg by mouth 2 (two) times daily.  [name] amLODipine (NORVASC) 2.5 MG tablet Take 1 tablet (2.5 mg total) by mouth daily.  . Ascorbic Acid (VITAMIN C) 1000 MG tablet Take 1,000 mg by mouth 2 (two) times a day.  [name] atorvastatin (LIPITOR) 10 MG tablet Take 1 tablet (10 mg total) by mouth at  bedtime. (do not take for 3 days after taking colchicine)  . Cholecalciferol (VITAMIN D-3) 25 MCG (1000 UT) CAPS Take 1 capsule by mouth daily.  [name] COLACE 100 MG capsule Take 100 mg by mouth daily.   . Colchicine 0.6 MG CAPS Take 0.6 mg by mouth daily as needed.  [name] levothyroxine (SYNTHROID) 75 MCG tablet Take 1 tablet (75 mcg total) by mouth daily.  [name] losartan (COZAAR) 100 MG tablet Take 1 tablet by mouth daily (Avoid salt substitutes, no fruit smoothies.)  . niacinamide 500 MG tablet Take 1 tablet (500 mg total) by mouth 2 (two) times daily with a meal.  . OVER THE COUNTER MEDICATION Take 1 capsule by mouth daily. CBD oil (Active Gold Label)  . Probiotic Product (PROBIOTIC DAILY PO) Take by mouth.  . thiamine 100 MG tablet Take 100 mg by mouth daily.  . Turmeric 450 MG CAPS Take 500 mg by mouth daily.   . ursodiol (ACTIGALL) 300 MG capsule Take 1 capsule (300 mg total) by mouth daily.  . vitamin B-12 (CYANOCOBALAMIN) 250 MCG tablet Take 250 mcg by mouth daily.  [name] zinc gluconate 50 MG tablet Take 50 mg by mouth daily.  . [DISCONTINUED] meclizine (ANTIVERT) 25 MG tablet Take by mouth 2 (two) times daily as needed.   No facility-administered encounter medications on file as of 03/02/2021.    Allergies (verified) Indomethacin, Ace inhibitors, Augmentin [amoxicillin-pot clavulanate], Elemental sulfur, Levofloxacin in d5w, Levothyroxine sodium, and Penicillins   History:  Past Medical History:  Diagnosis Date  . Biliary cirrhosis ([place])   . Cancer of skin of leg   . Cellulitis   . CKD (chronic kidney disease) stage 3, GFR 30-59 ml/min (HCC)   . Gout   . Hiatal hernia Oct 2015   8.5cm  . Hiatal hernia 06/26/2014   8.5cm   . History of basal cell carcinoma (BCC) 09/12/2018   Oct 2016; Moh's surgery; right antitragus  . History of diverticulitis 2000  . Hyperlipidemia   . Hyperparathyroidism, secondary renal ([place]) 07/11/2018   Managed by nephrologist  . Hypertension   . Hypomagnesemia  07/03/2019  . Hypothyroidism   . Skin cancer of nose    Past Surgical History:  Procedure Laterality Date  . ABDOMINAL HYSTERECTOMY  1995  . CATARACT EXTRACTION    . FOOT SURGERY  2000  . HEMORRHOID SURGERY    . THYROID SURGERY  1999   para thyroid   Family History  Problem Relation Age of Onset  . Emphysema Father   . Asthma Father   . Hypertension Daughter   . Heart disease Daughter        3 stents  . Diabetes Daughter   . Hypertension Son   . Cancer Son        prostate  . Diabetes Son   . Heart disease Daughter        heart attack, 2 stents  . Multiple sclerosis Daughter   . Diabetes Daughter   . Fibromyalgia Daughter   . Diabetes Daughter    Social History   Socioeconomic History  . Marital status: Widowed    Spouse name: Joe  . Number of children: 4  . Years of education: some college  . Highest education level: 12th grade  Occupational History  . Occupation: Retired  Tobacco Use  . Smoking status: Never Smoker  . Smokeless tobacco: Never Used  . Tobacco comment: smoking cessation materials not required  Vaping Use  . Vaping Use: Never used  Substance and Sexual Activity  . Alcohol use: No  . Drug use: No  . Sexual activity: Not Currently  Other Topics Concern  . Not on file  Social History Narrative   Pt lives alone.    Active working in her yard with over 200 rose bushes.    Social Determinants of Health   Financial Resource Strain: Low Risk   . Difficulty of Paying Living Expenses: Not hard at all  Food Insecurity: No Food Insecurity  . Worried About Charity fundraiser in the Last Year: Never true  . Ran Out of Food in the Last Year: Never true  Transportation Needs: No Transportation Needs  . Lack of Transportation (Medical): No  . Lack of Transportation (Non-Medical): No  Physical Activity: Inactive  . Days of Exercise per Week: 0 days  . Minutes of Exercise per Session: 0 min  Stress: No Stress Concern Present  . Feeling of Stress :  Not at all  Social Connections: Moderately Isolated  . Frequency of Communication with Friends and Family: More than three times a week  . Frequency of Social Gatherings with Friends and Family: Three times a week  . Attends Religious Services: More than 4 times per year  . Active Member of Clubs or Organizations: No  . Attends Archivist Meetings: Never  . Marital Status: Widowed    Tobacco Counseling Counseling given: Not Answered Comment: smoking cessation materials not required   Clinical Intake:  Pre-visit preparation  completed: Yes  Pain : No/denies pain     BMI - recorded: 25.47 Nutritional Status: BMI 25 -29 Overweight Nutritional Risks: None Diabetes: No  How often do you need to have someone help you when you read instructions, pamphlets, or other written materials from your doctor or pharmacy?: 1 - Never    Interpreter Needed?: No  Information entered by :: Clemetine Marker LPN   Activities of Daily Living In your present state of health, do you have any difficulty performing the following activities: 03/02/2021 02/18/2021  Hearing? Y N  Comment wears hearing aids -  Vision? Y Y  Difficulty concentrating or making decisions? N N  Walking or climbing stairs? Y Y  Dressing or bathing? N N  Doing errands, shopping? N N  Preparing Food and eating ? N -  Using the Toilet? N -  In the past six months, have you accidently leaked urine? Y -  Comment wears pads at night for protection -  Do you have problems with loss of bowel control? N -  Managing your Medications? N -  Managing your Finances? N -  Housekeeping or managing your Housekeeping? N -  Some recent data might be hidden    Patient Care Team: Delsa Grana, PA-C as PCP - General (Family Medicine) Dasher, Rayvon Char, MD (Dermatology) Lavonia Dana, MD as Consulting Physician (Internal Medicine) Marval Regal, NP as Nurse Practitioner (Nurse Practitioner)  Indicate any recent Medical  Services you may have received from other than [facility] providers in the past year (date may be approximate).     Assessment:   This is a routine wellness examination for Caitlin Jenkins.  Hearing/Vision screen  Hearing Screening   125Hz  250Hz  500Hz  1000Hz  2000Hz  3000Hz  4000Hz  6000Hz  8000Hz   Right ear:           Left ear:           Comments: Patient wears hearing aids  Vision Screening Comments: Annual vision screens with Dr Ellin Mayhew  Dietary issues and exercise activities discussed: Current Exercise Habits: The patient does not participate in regular exercise at present, Exercise limited by: orthopedic condition(s)  Goals Addressed            This Visit's Progress   . DIET - INCREASE WATER INTAKE   On track    Recommend to drink at least 6-8 8oz glasses of water per day.      Depression Screen PHQ 2/9 Scores 03/02/2021 02/18/2021 08/17/2020 02/27/2020 02/10/2020 10/08/2019 06/05/2019  PHQ - 2 Score 0 0 0 0 0 0 0  PHQ- 9 Score - - - - 0 0 0    Fall Risk Fall Risk  03/02/2021 02/18/2021 08/17/2020 02/27/2020 02/10/2020  Falls in the past year? 0 0 0 0 0  Number falls in past yr: 0 0 0 0 0  Injury with Fall? 0 0 0 0 0  Risk for fall due to : Impaired balance/gait - - No Fall Risks -  Risk for fall due to: Comment - - - - -  Follow up Falls prevention discussed Falls evaluation completed Falls evaluation completed Falls prevention discussed Falls evaluation completed    [place]:  Any stairs in or around the home? Yes  If so, are there any without handrails? No  Home free of loose throw rugs in walkways, pet beds, electrical cords, etc? Yes  Adequate lighting in your home to reduce risk of falls? Yes   ASSISTIVE DEVICES UTILIZED TO PREVENT  FALLS:  Life alert? No  Use of a cane, walker or w/c? No  Grab bars in the bathroom? Yes  Shower chair or bench in shower? No  Elevated toilet seat or a handicapped toilet? Yes   TIMED UP AND GO:  Was the test  performed? Yes .  Length of time to ambulate 10 feet: 6 sec.   Gait steady and fast without use of assistive device  Cognitive Function: Normal cognitive status assessed by direct observation by this Nurse Health Advisor. No abnormalities found.       6CIT Screen 02/27/2020 03/05/2019 03/02/2018  What Year? 0 points 0 points 0 points  What month? 0 points 0 points 0 points  What time? 0 points 0 points 0 points  Count back from 20 0 points 0 points 0 points  Months in reverse 0 points 0 points 0 points  Repeat phrase 0 points 0 points 0 points  Total Score 0 0 0    Immunizations Immunization History  Administered Date(s) Administered  . Fluad Quad(high Dose 65+) 06/05/2019  . Influenza, High Dose Seasonal PF 05/24/2016, 08/02/2017, 06/13/2018  . Influenza, Seasonal, Injecte, Preservative Fre 07/22/2013, 07/15/2014  . Influenza,inj,Quad PF,6+ Mos 06/19/2015  . Influenza-Unspecified 07/18/2012, 07/21/2014, 08/02/2017  . Pneumococcal Conjugate-13 05/16/2014  . Pneumococcal Polysaccharide-23 09/26/2013  . Zoster Recombinat (Shingrix) 07/24/2018, 11/07/2018  . Zoster, Live 09/26/2010    TDAP status: Due, Education has been provided regarding the importance of this vaccine. Advised may receive this vaccine at local pharmacy or Health Dept. Aware to provide a copy of the vaccination record if obtained from local pharmacy or Health Dept. Verbalized acceptance and understanding.  Flu Vaccine status: Declined, Education has been provided regarding the importance of this vaccine but patient still declined. Advised may receive this vaccine at local pharmacy or Health Dept. Aware to provide a copy of the vaccination record if obtained from local pharmacy or Health Dept. Verbalized acceptance and understanding.  Pneumococcal vaccine status: Up to date  Covid-19 vaccine status: Declined, Education has been provided regarding the importance of this vaccine but patient still declined. Advised may  receive this vaccine at local pharmacy or Health Dept.or vaccine clinic. Aware to provide a copy of the vaccination record if obtained from local pharmacy or Health Dept. Verbalized acceptance and understanding.  Qualifies for Shingles Vaccine? Yes   Zostavax completed Yes   Shingrix Completed?: Yes  Screening Tests Health Maintenance  Topic Date Due  . Pneumococcal Vaccine 7-51 Years old (1 of 4 - PCV13) Never done  . COVID-19 Vaccine (1) 03/06/2021 (Originally 10/03/1943)  . TETANUS/TDAP  02/18/2022 (Originally 10/03/1950)  . DEXA SCAN  03/02/2022 (Originally 10/03/2016)  . INFLUENZA VACCINE  04/26/2021  . PNA vac Low Risk Adult  Completed  . Zoster Vaccines- Shingrix  Completed  . HPV VACCINES  Aged Out    Health Maintenance  Health Maintenance Due  Topic Date Due  . Pneumococcal Vaccine 58-73 Years old (1 of 4 - PCV13) Never done    Colorectal cancer screening: No longer required.   Mammogram status: No longer required due to age.  Bone density screening: no longer required due to age  Lung Cancer Screening: (Low Dose CT Chest recommended if Age 59-80 years, 30 pack-year currently smoking OR have quit w/in 15years.) does not qualify.  Additional Screening:  Hepatitis C Screening: does not qualify.   Vision Screening: Recommended annual ophthalmology exams for early detection of glaucoma and other disorders of the eye. Is the patient up  to date with their annual eye exam?  Yes  Who is the provider or what is the name of the office in which the patient attends annual eye exams? [facility].   Dental Screening: Recommended annual dental exams for proper oral hygiene  Community Resource Referral / Chronic Care Management: CRR required this visit?  No   CCM required this visit?  No      Plan:     I have personally reviewed and noted the following in the patient's chart:   . Medical and social history . Use of alcohol, tobacco or illicit drugs  . Current  medications and supplements including opioid prescriptions.  . Functional ability and status . Nutritional status . Physical activity . Advanced directives . List of other physicians . Hospitalizations, surgeries, and ER visits in previous 12 months . Vitals . Screenings to include cognitive, depression, and falls . Referrals and appointments  In addition, I have reviewed and discussed with patient certain preventive protocols, quality metrics, and best practice recommendations. A written personalized care plan for preventive services as well as general preventive health recommendations were provided to patient.     Clemetine Marker, LPN   10/02/7114   Nurse Notes: pt's blood pressure slightly elevated during visit 150/84; consistent with patient's home reading from earlier today.  Pt did bring log of blood pressures from home averaging 130s/70s. Scheduled for follow up with Kathrine Haddock NP on 03/23/21.   Pt doing well and appreciative of visit today.

## 2021-03-02 NOTE — Patient Instructions (Signed)
Ms. Pho , Thank you for taking time to come for your Medicare Wellness Visit. I appreciate your ongoing commitment to your health goals. Please review the following plan we discussed and let me know if I can assist you in the future.   Screening recommendations/referrals: Colonoscopy: no longer required Mammogram: no longer required Bone Density: no longer required Recommended yearly ophthalmology/optometry visit for glaucoma screening and checkup Recommended yearly dental visit for hygiene and checkup  Vaccinations: Influenza vaccine: declined Pneumococcal vaccine: done 05/16/14 Tdap vaccine: due Shingles vaccine: done 07/24/18 & 11/07/18    Covid-19: declined  Conditions/risks identified: Keep up the great work!  Next appointment: Follow up in one year for your annual wellness visit    Preventive Care 65 Years and Older, Female Preventive care refers to lifestyle choices and visits with your health care provider that can promote health and wellness. What does preventive care include?  A yearly physical exam. This is also called an annual well check.  Dental exams once or twice a year.  Routine eye exams. Ask your health care provider how often you should have your eyes checked.  Personal lifestyle choices, including:  Daily care of your teeth and gums.  Regular physical activity.  Eating a healthy diet.  Avoiding tobacco and drug use.  Limiting alcohol use.  Practicing safe sex.  Taking low-dose aspirin every day.  Taking vitamin and mineral supplements as recommended by your health care provider. What happens during an annual well check? The services and screenings done by your health care provider during your annual well check will depend on your age, overall health, lifestyle risk factors, and family history of disease. Counseling  Your health care provider may ask you questions about your:  Alcohol use.  Tobacco use.  Drug use.  Emotional  well-being.  Home and relationship well-being.  Sexual activity.  Eating habits.  History of falls.  Memory and ability to understand (cognition).  Work and work Statistician.  Reproductive health. Screening  You may have the following tests or measurements:  Height, weight, and BMI.  Blood pressure.  Lipid and cholesterol levels. These may be checked every 5 years, or more frequently if you are over 80 years old.  Skin check.  Lung cancer screening. You may have this screening every year starting at age 47 if you have a 30-pack-year history of smoking and currently smoke or have quit within the past 15 years.  Fecal occult blood test (FOBT) of the stool. You may have this test every year starting at age 16.  Flexible sigmoidoscopy or colonoscopy. You may have a sigmoidoscopy every 5 years or a colonoscopy every 10 years starting at age 54.  Hepatitis C blood test.  Hepatitis B blood test.  Sexually transmitted disease (STD) testing.  Diabetes screening. This is done by checking your blood sugar (glucose) after you have not eaten for a while (fasting). You may have this done every 1-3 years.  Bone density scan. This is done to screen for osteoporosis. You may have this done starting at age 85.  Mammogram. This may be done every 1-2 years. Talk to your health care provider about how often you should have regular mammograms. Talk with your health care provider about your test results, treatment options, and if necessary, the need for more tests. Vaccines  Your health care provider may recommend certain vaccines, such as:  Influenza vaccine. This is recommended every year.  Tetanus, diphtheria, and acellular pertussis (Tdap, Td) vaccine. You may need  a Td booster every 10 years.  Zoster vaccine. You may need this after age 75.  Pneumococcal 13-valent conjugate (PCV13) vaccine. One dose is recommended after age 5.  Pneumococcal polysaccharide (PPSV23) vaccine. One  dose is recommended after age 80. Talk to your health care provider about which screenings and vaccines you need and how often you need them. This information is not intended to replace advice given to you by your health care provider. Make sure you discuss any questions you have with your health care provider. Document Released: 10/09/2015 Document Revised: 06/01/2016 Document Reviewed: 07/14/2015 Elsevier Interactive Patient Education  2017 [place] Prevention in the Home Falls can cause injuries. They can happen to people of all ages. There are many things you can do to make your home safe and to help prevent falls. What can I do on the outside of my home?  Regularly fix the edges of walkways and driveways and fix any cracks.  Remove anything that might make you trip as you walk through a door, such as a raised step or threshold.  Trim any bushes or trees on the path to your home.  Use bright outdoor lighting.  Clear any walking paths of anything that might make someone trip, such as rocks or tools.  Regularly check to see if handrails are loose or broken. Make sure that both sides of any steps have handrails.  Any raised decks and porches should have guardrails on the edges.  Have any leaves, snow, or ice cleared regularly.  Use sand or salt on walking paths during winter.  Clean up any spills in your garage right away. This includes oil or grease spills. What can I do in the bathroom?  Use night lights.  Install grab bars by the toilet and in the tub and shower. Do not use towel bars as grab bars.  Use non-skid mats or decals in the tub or shower.  If you need to sit down in the shower, use a plastic, non-slip stool.  Keep the floor dry. Clean up any water that spills on the floor as soon as it happens.  Remove soap buildup in the tub or shower regularly.  Attach bath mats securely with double-sided non-slip rug tape.  Do not have throw rugs and other  things on the floor that can make you trip. What can I do in the bedroom?  Use night lights.  Make sure that you have a light by your bed that is easy to reach.  Do not use any sheets or blankets that are too big for your bed. They should not hang down onto the floor.  Have a firm chair that has side arms. You can use this for support while you get dressed.  Do not have throw rugs and other things on the floor that can make you trip. What can I do in the kitchen?  Clean up any spills right away.  Avoid walking on wet floors.  Keep items that you use a lot in easy-to-reach places.  If you need to reach something above you, use a strong step stool that has a grab bar.  Keep electrical cords out of the way.  Do not use floor polish or wax that makes floors slippery. If you must use wax, use non-skid floor wax.  Do not have throw rugs and other things on the floor that can make you trip. What can I do with my stairs?  Do not leave any items on the  stairs.  Make sure that there are handrails on both sides of the stairs and use them. Fix handrails that are broken or loose. Make sure that handrails are as long as the stairways.  Check any carpeting to make sure that it is firmly attached to the stairs. Fix any carpet that is loose or worn.  Avoid having throw rugs at the top or bottom of the stairs. If you do have throw rugs, attach them to the floor with carpet tape.  Make sure that you have a light switch at the top of the stairs and the bottom of the stairs. If you do not have them, ask someone to add them for you. What else can I do to help prevent falls?  Wear shoes that:  Do not have high heels.  Have rubber bottoms.  Are comfortable and fit you well.  Are closed at the toe. Do not wear sandals.  If you use a stepladder:  Make sure that it is fully opened. Do not climb a closed stepladder.  Make sure that both sides of the stepladder are locked into place.  Ask  someone to hold it for you, if possible.  Clearly mark and make sure that you can see:  Any grab bars or handrails.  First and last steps.  Where the edge of each step is.  Use tools that help you move around (mobility aids) if they are needed. These include:  Canes.  Walkers.  Scooters.  Crutches.  Turn on the lights when you go into a dark area. Replace any light bulbs as soon as they burn out.  Set up your furniture so you have a clear path. Avoid moving your furniture around.  If any of your floors are uneven, fix them.  If there are any pets around you, be aware of where they are.  Review your medicines with your doctor. Some medicines can make you feel dizzy. This can increase your chance of falling. Ask your doctor what other things that you can do to help prevent falls. This information is not intended to replace advice given to you by your health care provider. Make sure you discuss any questions you have with your health care provider. Document Released: 07/09/2009 Document Revised: 02/18/2016 Document Reviewed: 10/17/2014 Elsevier Interactive Patient Education  2017 [employer].

## 2021-03-05 ENCOUNTER — Other Ambulatory Visit: Payer: Self-pay

## 2021-03-05 ENCOUNTER — Ambulatory Visit: Payer: PPO | Admitting: Physical Therapy

## 2021-03-05 ENCOUNTER — Encounter: Payer: Self-pay | Admitting: Physical Therapy

## 2021-03-05 DIAGNOSIS — M6281 Muscle weakness (generalized): Secondary | ICD-10-CM | POA: Diagnosis not present

## 2021-03-05 NOTE — Therapy (Signed)
[facility] [address], [place], [address] Phone: [phone]   Fax:  [phone]  Physical Therapy Treatment  Patient Details  Name: Caitlin Jenkins MRN: [id number] Date of Birth: 1932/05/02 No data recorded  Encounter Date: 03/05/2021    Past Medical History:  Diagnosis Date   Biliary cirrhosis ([place])    Cancer of skin of leg    Cellulitis    CKD (chronic kidney disease) stage 3, GFR 30-59 ml/min ([place])    Gout    Hiatal hernia Oct 2015   8.5cm   Hiatal hernia 06/26/2014   8.5cm    History of basal cell carcinoma ([place]) 09/12/2018   Oct 2016; Moh's surgery; right antitragus   History of diverticulitis 2000   Hyperlipidemia    Hyperparathyroidism, secondary renal ([place]) 07/11/2018   Managed by nephrologist   Hypertension    Hypomagnesemia 07/03/2019   Hypothyroidism    Skin cancer of nose     Past Surgical History:  Procedure Laterality Date   ABDOMINAL HYSTERECTOMY  1995   CATARACT EXTRACTION     FOOT SURGERY  2000   HEMORRHOID SURGERY     THYROID SURGERY  1999   para thyroid    There were no vitals filed for this visit.   Subjective Assessment - 03/05/21 1106     Subjective Pt reports active participation in HEP and feels that they have helped improve her strength already.              There Ex  Nu step level 1 x 6 min  Reciprocal stair training  3 x 4 6in steps with    FWD/LAT step downs on 4 inch step 2 x 10 bilat with min cueing to touch with heel and use UE minimally.   Sit to Stand with foam pad  1 x 10; 1 x10 w/ 3 # DB with cueing to extend UE with slight bend at the elbow  Standing Hip ABD 2 x10 reps cueing to keep toes pointed straight and pause for 1-2 sec at end range                         PT Education - 03/05/21 1110     Education Details Pt educated on importance of safety during exercise (holding onto countertop or sturdy surface). Pt  also educated on eating well in order improve energy level and strength to optimize rehab potential.    Person(s) Educated Patient    Methods Explanation;Demonstration    Comprehension Verbalized understanding;Returned demonstration              PT Short Term Goals - 03/01/21 1259       PT SHORT TERM GOAL #1   Title Pt will be independent with HEP in order to improve strength and balance in order to decrease fall risk and improve function at home and work.    Baseline 03/01/21 HEP given    Time 4    Period Weeks    Status New               PT Long Term Goals - 03/01/21 1302       PT LONG TERM GOAL #1   Title Pt will decrease 5TSTS to at least 12 seconds in order to demonstrate clinically significant improvement in LE strength and age matched norms    Baseline 03/01/21 16sec    Time 8  Period Weeks    Status New      PT LONG TERM GOAL #2   Title Patient will increase FOTO score to 60 to demonstrate predicted increase in functional mobility to complete ADLs    Baseline 03/01/21 40    Time 8    Period Weeks    Status New      PT LONG TERM GOAL #3   Title Pt will demonstrate reciprcol stair negotiation with 10# weight and unilateral HHA to demonstrate safety carrying groceries/items into home    Baseline 03/01/21 step to pattern ascent, backward descent with heavy UE use    Time 8    Period Weeks    Status New      PT LONG TERM GOAL #4   Title Pt will improve ABC by at least 13% in order to demonstrate clinically significant improvement in balance confidence.    Baseline 03/01/21 42.8%    Time 8    Period Weeks    Status New                  Plan - 03/05/21 1137     Clinical Impression Statement Patient tolerated session well evdenced by no reports of pain or overexertion with ther ex. Pt continues to demostrate LE weakness evidenced by R knee valgus and significant hip drop when descending stairs reciprocally. Pt will benefit from skilled physical therapy  to ensure proper and safe exercise progression to decrease fall risk and increase activity tolerance.             Patient will benefit from skilled therapeutic intervention in order to improve the following deficits and impairments:     Visit Diagnosis: Muscle weakness (generalized)     Problem List Patient Active Problem List   Diagnosis Date Noted   History of basal cell carcinoma (BCC) 09/12/2018   Hyperparathyroidism, secondary renal ([place]) 07/11/2018   Primary biliary cholangitis ([place]) 03/20/2017   Overweight (BMI 25.0-29.9) 11/29/2015   Carotid artery disease ([place]) 10/09/2015   Nail abnormalities 10/09/2015   Arthralgia of multiple joints 10/09/2015   Essential hypertension, benign 06/19/2015   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 [place], [address] Phone: [phone]   Fax:  [phone]  Name: Caitlin Jenkins MRN: [id number] Date of Birth: 04/06/1932

## 2021-03-08 ENCOUNTER — Ambulatory Visit: Payer: PPO | Admitting: Gastroenterology

## 2021-03-08 ENCOUNTER — Ambulatory Visit: Payer: PPO | Admitting: Physical Therapy

## 2021-03-08 ENCOUNTER — Other Ambulatory Visit: Payer: Self-pay

## 2021-03-08 ENCOUNTER — Encounter: Payer: Self-pay | Admitting: Gastroenterology

## 2021-03-08 ENCOUNTER — Encounter: Payer: Self-pay | Admitting: Physical Therapy

## 2021-03-08 VITALS — BP 175/91 | HR 79 | Temp 97.3°F | Ht 64.0 in | Wt 148.4 lb

## 2021-03-08 DIAGNOSIS — M6281 Muscle weakness (generalized): Secondary | ICD-10-CM | POA: Diagnosis not present

## 2021-03-08 DIAGNOSIS — K743 Primary biliary cirrhosis: Secondary | ICD-10-CM | POA: Diagnosis not present

## 2021-03-08 NOTE — Progress Notes (Signed)
Primary Care Physician: Delsa Grana, PA-C  Primary Gastroenterologist:  Dr. Lucilla Lame  Chief Complaint  Patient presents with   Medication Refill    HPI: Caitlin Jenkins is a 87 y.o. female here for follow-up.  The patient is in need of a refill of her medication.  The patient has a history of PBC and is on ursodeoxycholic acid. She reports that she has been doing well on it without any complaints.  The patient's most recent lab work showed her to have normal liver enzymes.  Past Medical History:  Diagnosis Date   Biliary cirrhosis ([place])    Cancer of skin of leg    Cellulitis    CKD (chronic kidney disease) stage 3, GFR 30-59 ml/min ([facility])    Gout    Hiatal hernia Oct 2015   8.5cm   Hiatal hernia 06/26/2014   8.5cm    History of basal cell carcinoma (BCC) 09/12/2018   Oct 2016; Moh's surgery; right antitragus   History of diverticulitis 2000   Hyperlipidemia    Hyperparathyroidism, secondary renal ([place]) 07/11/2018   Managed by nephrologist   Hypertension    Hypomagnesemia 07/03/2019   Hypothyroidism    Skin cancer of nose     Current Outpatient Medications  Medication Sig Dispense Refill   acetaminophen (TYLENOL) 325 MG tablet Take 650 mg by mouth 2 (two) times daily.     amLODipine (NORVASC) 2.5 MG tablet Take 1 tablet (2.5 mg total) by mouth daily. 90 tablet 3   Ascorbic Acid (VITAMIN C) 1000 MG tablet Take 1,000 mg by mouth 2 (two) times a day.     atorvastatin (LIPITOR) 10 MG tablet Take 1 tablet (10 mg total) by mouth at bedtime. (do not take for 3 days after taking colchicine) 90 tablet 3   Cholecalciferol (VITAMIN D-3) 25 MCG (1000 UT) CAPS Take 1 capsule by mouth daily.     COLACE 100 MG capsule Take 100 mg by mouth daily.      Colchicine 0.6 MG CAPS Take 0.6 mg by mouth daily as needed. 30 capsule 0   levothyroxine (SYNTHROID) 75 MCG tablet Take 1 tablet (75 mcg total) by mouth daily. 90 tablet 0   losartan (COZAAR) 100 MG tablet Take 1 tablet by mouth  daily (Avoid salt substitutes, no fruit smoothies.) 90 tablet 3   niacinamide 500 MG tablet Take 1 tablet (500 mg total) by mouth 2 (two) times daily with a meal. 180 tablet 1   OVER THE COUNTER MEDICATION Take 1 capsule by mouth daily. CBD oil (Active Gold Label)     Probiotic Product (PROBIOTIC DAILY PO) Take by mouth.     thiamine 100 MG tablet Take 100 mg by mouth daily.     Turmeric 450 MG CAPS Take 500 mg by mouth daily.      ursodiol (ACTIGALL) 300 MG capsule Take 1 capsule (300 mg total) by mouth daily. 90 capsule 3   vitamin B-12 (CYANOCOBALAMIN) 250 MCG tablet Take 250 mcg by mouth daily.     zinc gluconate 50 MG tablet Take 50 mg by mouth daily.     No current facility-administered medications for this visit.    Allergies as of 03/08/2021 - Review Complete 03/08/2021  Allergen Reaction Noted   Indomethacin Hives 10/14/2014   Ace inhibitors Swelling 09/25/2014   Augmentin [amoxicillin-pot clavulanate] Other (See Comments) 03/12/2015   Elemental sulfur Other (See Comments) 03/13/2015   Levofloxacin in d5w Other (See Comments) 02/18/2021   Levothyroxine sodium  Other (See Comments) 02/18/2021   Penicillins Nausea And Vomiting 03/12/2015    ROS:  General: Negative for anorexia, weight loss, fever, chills, fatigue, weakness. ENT: Negative for hoarseness, difficulty swallowing , nasal congestion. CV: Negative for chest pain, angina, palpitations, dyspnea on exertion, peripheral edema.  Respiratory: Negative for dyspnea at rest, dyspnea on exertion, cough, sputum, wheezing.  GI: See history of present illness. GU:  Negative for dysuria, hematuria, urinary incontinence, urinary frequency, nocturnal urination.  Endo: Negative for unusual weight change.    Physical Examination:   BP (!) 175/91   Pulse 79   Temp (!) 97.3 F (36.3 C) (Temporal)   Ht 5\' 4"  (1.626 m)   Wt 148 lb 6.4 oz (67.3 kg)   BMI 25.47 kg/m   General: Well-nourished, well-developed in no acute distress.   Eyes: No icterus. Conjunctivae pink. Lungs: Clear to auscultation bilaterally. Non-labored. Heart: Regular rate and rhythm, no murmurs rubs or gallops.  Abdomen: Bowel sounds are normal, nontender, nondistended, no hepatosplenomegaly or masses, no abdominal bruits or hernia , no rebound or guarding.   Extremities: No lower extremity edema. No clubbing or deformities. Neuro: Alert and oriented x 3.  Grossly intact. Skin: Warm and dry, no jaundice.   Psych: Alert and cooperative, normal mood and affect.  Labs:    Imaging Studies: No results found.  Assessment and Plan:   Caitlin Jenkins is a 87 y.o. y/o female who comes in today for a follow-up of her PBC with normal liver enzymes.  The patient will have her medications refilled.  The patient has been told to contact me if she has any further GI issues.  The patient has been explained the plan and agrees with it.     Lucilla Lame, MD. Marval Regal    Note: This dictation was prepared with Dragon dictation along with smaller phrase technology. Any transcriptional errors that result from this process are unintentional.

## 2021-03-08 NOTE — Therapy (Signed)
[facility] [address], [place], [address] Phone: [phone]   Fax:  [phone]  Physical Therapy Treatment  Patient Details  Name: ANELISSE JACOBSON MRN: [id number] Date of Birth: May 11, 1932 No data recorded  Encounter Date: 03/08/2021   PT End of Session - 03/08/21 1039     Equipment Utilized During Treatment Gait belt    Activity Tolerance Patient tolerated treatment well    Behavior During Therapy [facility] for tasks assessed/performed             Past Medical History:  Diagnosis Date   Biliary cirrhosis ([place])    Cancer of skin of leg    Cellulitis    CKD (chronic kidney disease) stage 3, GFR 30-59 ml/min ([facility])    Gout    Hiatal hernia Oct 2015   8.5cm   Hiatal hernia 06/26/2014   8.5cm    History of basal cell carcinoma ([place]) 09/12/2018   Oct 2016; Moh's surgery; right antitragus   History of diverticulitis 2000   Hyperlipidemia    Hyperparathyroidism, secondary renal ([place]) 07/11/2018   Managed by nephrologist   Hypertension    Hypomagnesemia 07/03/2019   Hypothyroidism    Skin cancer of nose     Past Surgical History:  Procedure Laterality Date   [place]   para thyroid    There were no vitals filed for this visit.   Subjective Assessment - 03/08/21 0947     Subjective Pt reports to therapy stating her home exercise program is going well and that she feels comfortable progressing. Pt reports being sore after last session for approximately 24hours. She states that she was able to walk on her treadmill for 10 minutes over the weekend.               Therapeutic Exercise  Nu Step level 2 x 5 min LE only   OMEGA leg press 2 x 10 #25: 1 x 10 #35 cues to limit UE use to aid in the exercise  Stepping with hurdles FWD/LAT 3 x 4 hurdles w/ no wt; 3 x 4 w/ 2# ankle wt  cue to make large steps over hurdles *pt exhibited loss of balance with min assistance to correct.  Standing Marches 2 x 45 sec 2# ankles wts cueing to bring knees up as high as possible  Single Leg stance: 3 x  each LE for 30 sec   Seated Knee Ext: 1 x 10 reps  Seated Hip Flex: 1 x 10 reps  Seated Hip ABD:1 x 10 reps* cues to pause at the end range of each repetition                    PT Education - 03/08/21 1034     Education Details Pt educated on expected muscles soreness to last 24-48 hours. Pt educated to take needed rest breaks between sets and/or reps of exercise for safety.    Person(s) Educated Patient    Methods Explanation    Comprehension Verbalized understanding;Returned demonstration              PT Short Term Goals - 03/01/21 1259       PT SHORT TERM GOAL #1   Title Pt will be independent with HEP in order to improve strength and balance in  order to decrease fall risk and improve function at home and work.    Baseline 03/01/21 HEP given    Time 4    Period Weeks    Status New               PT Long Term Goals - 03/01/21 1302       PT LONG TERM GOAL #1   Title Pt will decrease 5TSTS to at least 12 seconds in order to demonstrate clinically significant improvement in LE strength and age matched norms    Baseline 03/01/21 16sec    Time 8    Period Weeks    Status New      PT LONG TERM GOAL #2   Title Patient will increase FOTO score to 60 to demonstrate predicted increase in functional mobility to complete ADLs    Baseline 03/01/21 40    Time 8    Period Weeks    Status New      PT LONG TERM GOAL #3   Title Pt will demonstrate reciprcol stair negotiation with 10# weight and unilateral HHA to demonstrate safety carrying groceries/items into home    Baseline 03/01/21 step to pattern ascent, backward descent with heavy UE use    Time 8    Period Weeks    Status New      PT LONG TERM GOAL #4   Title Pt will improve ABC by at least  13% in order to demonstrate clinically significant improvement in balance confidence.    Baseline 03/01/21 42.8%    Time 8    Period Weeks    Status New                         Plan - 03/08/21 1048     PT Next Visit Plan Plan next visit to incorporate ankle exercises.             Patient will benefit from skilled therapeutic intervention in order to improve the following deficits and impairments:  Pain, Postural dysfunction, Impaired flexibility, Increased fascial restricitons, Decreased strength, Decreased activity tolerance, Decreased coordination, Abnormal gait, Decreased balance, Decreased endurance, Decreased mobility, Improper body mechanics, Decreased range of motion, Difficulty walking, Increased muscle spasms  Visit Diagnosis: Muscle weakness (generalized)     Problem List Patient Active Problem List   Diagnosis Date Noted   History of basal cell carcinoma (BCC) 09/12/2018   Hyperparathyroidism, secondary renal ([place]) 07/11/2018   Primary biliary cholangitis ([place]) 03/20/2017   Overweight (BMI 25.0-29.9) 11/29/2015   Carotid artery disease ([place]) 10/09/2015   Nail abnormalities 10/09/2015   Arthralgia of multiple joints 10/09/2015   Essential hypertension, benign 06/19/2015   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, [place], [address] Phone: [phone]   Fax:  [phone]  Name: GAYLIN OSORIA MRN: [id number] Date of Birth: [birth year]

## 2021-03-12 ENCOUNTER — Ambulatory Visit: Payer: PPO | Admitting: Physical Therapy

## 2021-03-12 DIAGNOSIS — E039 Hypothyroidism, unspecified: Secondary | ICD-10-CM | POA: Diagnosis not present

## 2021-03-12 DIAGNOSIS — R0602 Shortness of breath: Secondary | ICD-10-CM | POA: Diagnosis not present

## 2021-03-12 DIAGNOSIS — S065X9A Traumatic subdural hemorrhage with loss of consciousness of unspecified duration, initial encounter: Secondary | ICD-10-CM | POA: Diagnosis not present

## 2021-03-12 DIAGNOSIS — N189 Chronic kidney disease, unspecified: Secondary | ICD-10-CM | POA: Diagnosis not present

## 2021-03-12 DIAGNOSIS — I10 Essential (primary) hypertension: Secondary | ICD-10-CM | POA: Diagnosis not present

## 2021-03-12 DIAGNOSIS — E785 Hyperlipidemia, unspecified: Secondary | ICD-10-CM | POA: Diagnosis not present

## 2021-03-12 DIAGNOSIS — R000 Tachycardia, unspecified: Secondary | ICD-10-CM | POA: Diagnosis not present

## 2021-03-12 DIAGNOSIS — G4700 Insomnia, unspecified: Secondary | ICD-10-CM | POA: Diagnosis not present

## 2021-03-12 DIAGNOSIS — E871 Hypo-osmolality and hyponatremia: Secondary | ICD-10-CM | POA: Diagnosis not present

## 2021-03-12 DIAGNOSIS — M791 Myalgia, unspecified site: Secondary | ICD-10-CM | POA: Diagnosis not present

## 2021-03-12 DIAGNOSIS — M542 Cervicalgia: Secondary | ICD-10-CM | POA: Diagnosis not present

## 2021-03-12 DIAGNOSIS — Z66 Do not resuscitate: Secondary | ICD-10-CM | POA: Diagnosis not present

## 2021-03-12 DIAGNOSIS — I129 Hypertensive chronic kidney disease with stage 1 through stage 4 chronic kidney disease, or unspecified chronic kidney disease: Secondary | ICD-10-CM | POA: Diagnosis not present

## 2021-03-12 DIAGNOSIS — R413 Other amnesia: Secondary | ICD-10-CM | POA: Diagnosis not present

## 2021-03-12 DIAGNOSIS — W0110XA Fall on same level from slipping, tripping and stumbling with subsequent striking against unspecified object, initial encounter: Secondary | ICD-10-CM | POA: Diagnosis not present

## 2021-03-12 DIAGNOSIS — M545 Low back pain, unspecified: Secondary | ICD-10-CM | POA: Diagnosis not present

## 2021-03-12 DIAGNOSIS — R109 Unspecified abdominal pain: Secondary | ICD-10-CM | POA: Diagnosis not present

## 2021-03-12 DIAGNOSIS — Z888 Allergy status to other drugs, medicaments and biological substances status: Secondary | ICD-10-CM | POA: Diagnosis not present

## 2021-03-12 DIAGNOSIS — Z90710 Acquired absence of both cervix and uterus: Secondary | ICD-10-CM | POA: Diagnosis not present

## 2021-03-12 DIAGNOSIS — W19XXXA Unspecified fall, initial encounter: Secondary | ICD-10-CM | POA: Diagnosis not present

## 2021-03-12 DIAGNOSIS — T50905A Adverse effect of unspecified drugs, medicaments and biological substances, initial encounter: Secondary | ICD-10-CM | POA: Diagnosis not present

## 2021-03-12 DIAGNOSIS — R079 Chest pain, unspecified: Secondary | ICD-10-CM | POA: Diagnosis not present

## 2021-03-12 DIAGNOSIS — Y998 Other external cause status: Secondary | ICD-10-CM | POA: Diagnosis not present

## 2021-03-12 DIAGNOSIS — K769 Liver disease, unspecified: Secondary | ICD-10-CM | POA: Diagnosis not present

## 2021-03-12 DIAGNOSIS — M546 Pain in thoracic spine: Secondary | ICD-10-CM | POA: Diagnosis not present

## 2021-03-12 DIAGNOSIS — W1839XA Other fall on same level, initial encounter: Secondary | ICD-10-CM | POA: Diagnosis not present

## 2021-03-12 DIAGNOSIS — K449 Diaphragmatic hernia without obstruction or gangrene: Secondary | ICD-10-CM | POA: Diagnosis not present

## 2021-03-12 DIAGNOSIS — I629 Nontraumatic intracranial hemorrhage, unspecified: Secondary | ICD-10-CM | POA: Diagnosis not present

## 2021-03-12 DIAGNOSIS — S065X0A Traumatic subdural hemorrhage without loss of consciousness, initial encounter: Secondary | ICD-10-CM | POA: Diagnosis not present

## 2021-03-12 DIAGNOSIS — R918 Other nonspecific abnormal finding of lung field: Secondary | ICD-10-CM | POA: Diagnosis not present

## 2021-03-12 DIAGNOSIS — Z79899 Other long term (current) drug therapy: Secondary | ICD-10-CM | POA: Diagnosis not present

## 2021-03-12 DIAGNOSIS — Z882 Allergy status to sulfonamides status: Secondary | ICD-10-CM | POA: Diagnosis not present

## 2021-03-12 DIAGNOSIS — W1830XA Fall on same level, unspecified, initial encounter: Secondary | ICD-10-CM | POA: Diagnosis not present

## 2021-03-12 DIAGNOSIS — Z20822 Contact with and (suspected) exposure to covid-19: Secondary | ICD-10-CM | POA: Diagnosis not present

## 2021-03-12 DIAGNOSIS — S199XXA Unspecified injury of neck, initial encounter: Secondary | ICD-10-CM | POA: Diagnosis not present

## 2021-03-12 DIAGNOSIS — Z880 Allergy status to penicillin: Secondary | ICD-10-CM | POA: Diagnosis not present

## 2021-03-13 DIAGNOSIS — T1490XA Injury, unspecified, initial encounter: Secondary | ICD-10-CM | POA: Insufficient documentation

## 2021-03-13 DIAGNOSIS — S065X9A Traumatic subdural hemorrhage with loss of consciousness of unspecified duration, initial encounter: Secondary | ICD-10-CM | POA: Diagnosis not present

## 2021-03-13 DIAGNOSIS — R000 Tachycardia, unspecified: Secondary | ICD-10-CM | POA: Diagnosis not present

## 2021-03-14 DIAGNOSIS — S065X9A Traumatic subdural hemorrhage with loss of consciousness of unspecified duration, initial encounter: Secondary | ICD-10-CM | POA: Insufficient documentation

## 2021-03-14 DIAGNOSIS — W19XXXA Unspecified fall, initial encounter: Secondary | ICD-10-CM | POA: Insufficient documentation

## 2021-03-14 DIAGNOSIS — S065XAA Traumatic subdural hemorrhage with loss of consciousness status unknown, initial encounter: Secondary | ICD-10-CM | POA: Insufficient documentation

## 2021-03-15 ENCOUNTER — Telehealth: Payer: Self-pay

## 2021-03-15 ENCOUNTER — Ambulatory Visit: Payer: PPO | Admitting: Physical Therapy

## 2021-03-15 NOTE — Telephone Encounter (Signed)
Transition Care Management Follow-up Telephone Call Date of discharge and from where: 03/14/21 [facility] How have you been since you were released from the hospital? Pt states she is doing okay, sleeping a lot but denies pain, good appetite, ambulating with walker and daughters are taking turns staying with her.  Any questions or concerns? Yes - pt states she was supposed to have home health physical therapy. Per CM note at discharge unable to obtain home health due to staffing & network issues and pt to continue outpatient PT but they would prefer in home at this time.  Pt is also concerned about hiatal hernia that was causing difficulty breathing when it shifted during/after fall but has subsided.   Items Reviewed: Did the pt receive and understand the discharge instructions provided? Yes  Medications obtained and verified? Yes  Other? No  Any new allergies since your discharge? No  Dietary orders reviewed? Yes Do you have support at home? Yes   Home Care and Equipment/Supplies: Were home health services ordered? yes Patient's daughter has a prescription for physical therapy and plans to discuss at appt tomorrow.  Has the agency set up a time to come to the patient's home? no Were any new equipment or medical supplies ordered?  No - pt has walker and shower chair at home  Functional Questionnaire: (I = Independent and D = Dependent) ADLs: I  Bathing/Dressing- I with assistance  Meal Prep- D  Eating- I  Maintaining continence- I  Transferring/Ambulation- I with walker  Managing Meds- I  Follow up appointments reviewed:  PCP Hospital f/u appt confirmed? Yes  Scheduled to see Delsa Grana PAC on 03/16/21 @ 9:20. [facility] f/u appt confirmed? Yes  Scheduled to see outpatient neurosurgery in July at [facility].  Are transportation arrangements needed? No  If their condition worsens, is the pt aware to call PCP or go to the Emergency Dept.? Yes Was the patient  provided with contact information for the PCP's office or ED? Yes Was to pt encouraged to call back with questions or concerns? Yes

## 2021-03-16 ENCOUNTER — Encounter: Payer: Self-pay | Admitting: Family Medicine

## 2021-03-16 ENCOUNTER — Ambulatory Visit (INDEPENDENT_AMBULATORY_CARE_PROVIDER_SITE_OTHER): Payer: PPO | Admitting: Family Medicine

## 2021-03-16 ENCOUNTER — Other Ambulatory Visit: Payer: Self-pay

## 2021-03-16 VITALS — BP 142/82 | HR 74 | Temp 98.2°F | Resp 16 | Ht 64.0 in | Wt 142.8 lb

## 2021-03-16 DIAGNOSIS — R634 Abnormal weight loss: Secondary | ICD-10-CM

## 2021-03-16 DIAGNOSIS — S065XAA Traumatic subdural hemorrhage with loss of consciousness status unknown, initial encounter: Secondary | ICD-10-CM

## 2021-03-16 DIAGNOSIS — W19XXXA Unspecified fall, initial encounter: Secondary | ICD-10-CM | POA: Diagnosis not present

## 2021-03-16 DIAGNOSIS — E871 Hypo-osmolality and hyponatremia: Secondary | ICD-10-CM

## 2021-03-16 DIAGNOSIS — R6881 Early satiety: Secondary | ICD-10-CM

## 2021-03-16 DIAGNOSIS — Z09 Encounter for follow-up examination after completed treatment for conditions other than malignant neoplasm: Secondary | ICD-10-CM | POA: Diagnosis not present

## 2021-03-16 DIAGNOSIS — S065X9A Traumatic subdural hemorrhage with loss of consciousness of unspecified duration, initial encounter: Secondary | ICD-10-CM

## 2021-03-16 DIAGNOSIS — N2581 Secondary hyperparathyroidism of renal origin: Secondary | ICD-10-CM

## 2021-03-16 DIAGNOSIS — R42 Dizziness and giddiness: Secondary | ICD-10-CM | POA: Diagnosis not present

## 2021-03-16 MED ORDER — PANTOPRAZOLE SODIUM 40 MG PO TBEC: 40.0000 mg | DELAYED_RELEASE_TABLET | Freq: Every day | ORAL | 1 refills | Status: DC

## 2021-03-16 MED ORDER — MECLIZINE HCL 25 MG PO TABS: 12.5000 mg | ORAL_TABLET | Freq: Three times a day (TID) | ORAL | 0 refills | Status: DC | PRN

## 2021-03-16 NOTE — Patient Instructions (Signed)
Follow up instructions from [facility]/trauma/hospital:  Patient Instructions:  DISCHARGE INSTRUCTIONS  Please call the [facility] at [phone] for any questions or for any new symptoms, such as the following:  Repeated vomiting and nausea Unable to pee Headache that gets worse or does not go away  Sudden or worsening abdominal pain Unable to pass a stool in 3-4 days Signs of infection at your wound: redness, warm/hot to touch, foul smelling drainage, thick pus yellow/green drainage, or increased pain  Unable to drink fluids or tolerate food Fever of 101.5 or above   If unable to contact the [facility] go to the hospital Emergency Department to be evaluated and treated.  Please call 911 or go to the nearest emergency department if any of the following occur: Dizziness, fainting, hearing loss, blurred vision, or changes in mental status Uncontrollable body jerking or movements; seizures Have one pupil (the black part in the middle of the eye) that is larger than the other Decreased sensation/feeling, tingling sensation/feeling, change in skin color (such as pale, blue, red), swelling, or unable to move any extremity  Getting more confused, restless, or agitated Slurred speech Weakness, numbness, or decreased coordination/balance Loss of consciousness (even a brief loss of consciousness should be taken seriously and the person should be carefully monitored)    If you develop chest pain or shortness of breath, please go to the nearest Emergency Department to be evaluated and treated immediately.  SURGERY CONTACT TELEPHONE NUMBERS  Please call with any questions to the following contact numbers:   For Trauma Service [phone]   You can also call the [facility] line any time twenty-four hours a day seven days a week at [phone].  Trauma Nursing Triage (Monday-Friday daytime working hours) [facility] (Monday-Friday daytime working hours) [phone]  Discharge Hospital assistance 24 hour line [phone]  [facility] Operator - 24 hours a day / 7 days a week [phone]  Consulting services that may have participated in your care:  Orthopedic( Trauma, [place], Spine, Sports, etc) [phone]  Neurosurgery [phone]  Plastic Surgery [phone]  ENT [phone]  Dentistry [phone]  Ophthalmology [phone]  Urology [phone]  Vascular Surgery [phone]   FOLLOW UP APPOINTMENTS: Follow up at the Outpatient APP (Advanced Practice Provider - Nurse Practitioner or Physician's Assistant) [facility] is not necessary after discharge. If you feel you need to see this service for an issue related to this trauma, please call and schedule an appointment. [phone]).

## 2021-03-16 NOTE — Progress Notes (Signed)
Patient ID: Caitlin Jenkins, female    DOB: 02-14-1932, 87 y.o.   MRN: [id number]  PCP: Delsa Grana, PA-C  Chief Complaint  Patient presents with   Fall    Subjective:   Yvonda Fouty Proby is a 87 y.o. female, presents to clinic with CC of the following:  HPI  Here for HFU Fall with vertigo, was told she needs stones in ears reset Here for hospital follow up/transition of care.  Admit date: 03/12/21 Discharge date: 03/14/21 Transition of care was initiated previously by Clemetine Marker on 03/15/21 and med changes, diagnosis, specialist follow ups and pts symptoms and condition were all reviewed.   Fall with head injury and subsequently dx with subdural hematoma - admitted at [facility] in [place] 03/12/21-03/14/21  Rosemond W Nedrow is a 87 y.o. 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 She was initially taken to an outside hospital where imaging demonstrated subdural hematoma with midline shift, she was then transported to [facility] for further care. Trauma workup revealed 1.1cm SDH with midline shift, no other traumatic injuries. Neurosurgical consultation was obtained. Bleed stabilized on repeat head CTs, patient started on 1 week total Keppra for seizure prevention.   New meds include keppra for seizure prevention - completed after 7 d, she has been tired, a little less sharp than her normal  Imaging reviewed: Significant Diagnostic Studies:  CT Head Wo Contrast  Final Result    Overall similar volume of acute subdural hemorrhage with interval redistribution along the superior left cerebral convexity and falx. No progressive intracranial mass effect.   CT LEVEL 1-2 SPINE THORACIC LUMBAR REFORMATS W ED TRAUMA  Final Result    1. No acute fracture or traumatic malalignment of the thoracic and lumbar spine.  2. Please reference CT chest, abdomen, and pelvis for characterization of  all structures outside of the thoracic and lumbar spine.   CT LEVEL 1-2 ANGIOGRAPHY NECK ED TRAUMA  Final Result    Motion degraded study without evidence of an acute arterial injury in the neck.   CT CHEST ABDOMEN PELVIS W CONTRAST (TRAUMA) - standard pan scan  Final Result   1. No acute traumatic injury to the chest, abdomen and pelvis.  2. Please see separately dictated report for detailed findings related to the thoracolumbar spine.  3. Large hiatal hernia containing the majority of the stomach with organoaxial rotation. There is is favored to be chronic as a moderate size hiatal hernia is described on a fluoroscopic barium swallow study at [facility] from 09/05/2014.  4. Additional ancillary findings as above.   XR CHEST AP PORTABLE  Final Result    Left basilar opacity may reflect a distended hiatal hernia versus lower lobe airspace disease.  She presents with HA, neck feels fine, overall a little tired.  Behavior and personality at baseline, no passing out episodes, no seizure like activity.  She needs referral for [facility] and PT - she was doing outside PT for generalized LE weakness and change to gait and balance, currently she doesn't feel she can do that PT but needs vestibular rehab She endorses dizzy or lightheaded episodes only when getting up, none with head movements or rolling over in bed No focal weakness or dumbness She has healing bruising to scalp and back of head, worse pain is to back of head  She had abnormal labs in hospital, hyponatremia and hypomag, last labs  here in clinic Na was normal She does not want to repeat labs now, but we will try to arrange early next week with home health or return to clinic  She has some decreased appetite, indigestion and feels full.  She denies abd pain, N, V, D Had been loosing weight prior to fall Wt Readings from Last 8 Encounters:  03/16/21 142 lb 12.8 oz (64.8 kg)  03/08/21 148 lb 6.4 oz (67.3 kg)  03/02/21 148 lb 6.4 oz  (67.3 kg)  02/18/21 149 lb 6.4 oz (67.8 kg)  08/17/20 153 lb 11.2 oz (69.7 kg)  02/27/20 159 lb 1.6 oz (72.2 kg)   BMI Readings from Last 8 Encounters:  03/16/21 24.51 kg/m  03/08/21 25.47 kg/m  03/02/21 25.47 kg/m  02/18/21 25.64 kg/m  08/17/20 26.38 kg/m  02/27/20 27.31 kg/m        Patient Active Problem List   Diagnosis Date Noted   Fall 03/14/2021   SDH (subdural hematoma) ([place]) 03/14/2021   Trauma 03/13/2021   History of basal cell carcinoma (BCC) 09/12/2018   Hyperparathyroidism, secondary renal ([place]) 07/11/2018   Primary biliary cholangitis ([place]) 03/20/2017   Overweight (BMI 25.0-29.9) 11/29/2015   Carotid artery disease ([place]) 10/09/2015   Nail abnormalities 10/09/2015   Arthralgia of multiple joints 10/09/2015   Essential hypertension, benign 06/19/2015   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 (do not take for 3 days after taking colchicine), Disp: 90 tablet, Rfl: 3   Cholecalciferol (VITAMIN D-3) 25 MCG (1000 UT) CAPS, Take 1 capsule by mouth daily., Disp: , Rfl:    COLACE 100 MG capsule, Take 100 mg by mouth daily. , Disp: , Rfl:    Colchicine 0.6 MG CAPS, Take 0.6 mg by mouth daily as needed., Disp: 30 capsule, Rfl: 0   levETIRAcetam (KEPPRA) 500 MG tablet, Take 500 mg by mouth 2 (two) times daily., Disp: , Rfl:    levofloxacin (LEVAQUIN) 500 MG tablet, Take 500 mg by mouth daily., Disp:  , Rfl:    levothyroxine (SYNTHROID) 75 MCG tablet, Take 1 tablet (75 mcg total) by mouth daily., Disp: 90 tablet, Rfl: 0   losartan (COZAAR) 100 MG tablet, Take 1 tablet by mouth daily (Avoid salt substitutes, no fruit smoothies.), Disp: 90 tablet, Rfl: 3   niacinamide 500 MG tablet, Take 1 tablet (500 mg total) by mouth 2 (two) times daily with a meal., Disp: 180 tablet, Rfl: 1   nitrofurantoin, macrocrystal-monohydrate, (MACROBID) 100 MG capsule, Take 100 mg by mouth 2 (two) times daily., Disp: , Rfl:    OVER THE COUNTER MEDICATION, Take 1 capsule by mouth daily. CBD oil (Active Gold Label), Disp: , Rfl:    Probiotic Product (PROBIOTIC DAILY PO), Take by mouth., Disp: , Rfl:    thiamine 100 MG tablet, Take 100 mg by mouth daily.,  Disp: , Rfl:    Turmeric 450 MG CAPS, Take 500 mg by mouth daily. , Disp: , Rfl:    ursodiol (ACTIGALL) 300 MG capsule, Take 1 capsule (300 mg total) by mouth daily., Disp: 90 capsule, Rfl: 3   vitamin B-12 (CYANOCOBALAMIN) 250 MCG tablet, Take 250 mcg by mouth daily., Disp: , Rfl:    zinc gluconate 50 MG tablet, Take 50 mg by mouth daily., Disp: , Rfl:    Allergies  Allergen Reactions   Indomethacin Hives   Ace Inhibitors Swelling    Other reaction(s): SWELLING Facial swelling. Facial swelling.   Augmentin [Amoxicillin-Pot Clavulanate] Other (See Comments)    Other reaction(s): Unknown   Elemental Sulfur Other (See Comments)   Levofloxacin In D5w Other (See Comments)    Tendinopathy and myalgia   Levothyroxine Sodium Other (See Comments)    Severe muscle weakness and tendinopathy   Penicillins Nausea And Vomiting    Has patient had a PCN reaction causing immediate rash, facial/tongue/throat swelling, SOB or lightheadedness with hypotension: Yes Has patient had a PCN reaction causing severe rash involving mucus membranes or skin necrosis: No Has patient had a PCN reaction that required hospitalization: No Has patient had a PCN reaction occurring within the  last 10 years: No If all of the above answers are "NO", then may proceed with Cephalosporin use.   Sulfa Antibiotics Swelling and Other (See Comments)     Social History   Tobacco Use   Smoking status: Never   Smokeless tobacco: Never   Tobacco comments:    smoking cessation materials not required  Vaping Use   Vaping Use: Never used  Substance Use Topics   Alcohol use: No   Drug use: No      Chart Review Today: I personally reviewed active problem list, medication list, allergies, family history, social history, health maintenance, notes from last encounter, lab results, imaging with the patient/caregiver today. Extensive chart review through care everywhere of records, specialist eval, imaging - 10+ min spent on same day of appt on chart review  Review of Systems  Constitutional: Negative.  Negative for activity change and appetite change.  HENT: Negative.    Eyes: Negative.   Respiratory: Negative.    Cardiovascular: Negative.   Gastrointestinal: Negative.   Endocrine: Negative.   Genitourinary: Negative.   Musculoskeletal: Negative.  Negative for neck pain and neck stiffness.  Skin: Negative.   Allergic/Immunologic: Negative.   Neurological:  Positive for weakness (generalized) and headaches. Negative for dizziness, syncope and facial asymmetry.  Hematological: Negative.   Psychiatric/Behavioral: Negative.    All other systems reviewed and are negative.     Objective:   Vitals:   03/16/21 0951  BP: (!) 142/82  Pulse: 74  Resp: 16  Temp: 98.2 F (36.8 C)  TempSrc: Oral  SpO2: 99%  Weight: 142 lb 12.8 oz (64.8 kg)  Height: 5' 4"  (1.626 m)    Body mass index is 24.51 kg/m.  Physical Exam Vitals and nursing note reviewed.  Constitutional:      General: She is not in acute distress.    Appearance: She is well-developed, well-groomed and normal weight. She is not ill-appearing, toxic-appearing or diaphoretic.  HENT:     Head: Normocephalic. No raccoon  eyes, Battle's sign, right periorbital erythema or left periorbital erythema.     Jaw: There is normal jaw occlusion.     Comments: Swelling and bruising to back of head and right parietal area, faded bruising into right temple  Right Ear: Tympanic membrane, ear canal and external ear normal.     Left Ear: Tympanic membrane, ear canal and external ear normal.     Nose: Nose normal.     Mouth/Throat:     Mouth: Mucous membranes are moist.     Pharynx: Oropharynx is clear. Uvula midline.  Eyes:     General:        Right eye: No discharge.        Left eye: No discharge.     Conjunctiva/sclera: Conjunctivae normal.     Pupils: Pupils are equal, round, and reactive to light.  Cardiovascular:     Rate and Rhythm: Normal rate and regular rhythm.     Pulses: Normal pulses.     Heart sounds: Normal heart sounds.  Pulmonary:     Effort: Pulmonary effort is normal.     Breath sounds: Normal breath sounds.  Abdominal:     General: Bowel sounds are normal.     Palpations: Abdomen is soft.  Musculoskeletal:     Cervical back: Normal range of motion and neck supple. No tenderness.  Lymphadenopathy:     Cervical: No cervical adenopathy.  Skin:    Findings: Bruising present.  Neurological:     Mental Status: She is alert.     Cranial Nerves: No cranial nerve deficit or facial asymmetry.     Sensory: Sensation is intact.     Motor: No tremor.     Coordination: Coordination is intact.     Gait: Gait abnormal (in WC did not assess gait).  Psychiatric:        Mood and Affect: Mood normal.        Behavior: Behavior normal. Behavior is cooperative.     Results for orders placed or performed in visit on 02/18/21  Comprehensive metabolic panel  Result Value Ref Range   Glucose, Bld 95 65 - 99 mg/dL   BUN 29 (H) 7 - 25 mg/dL   Creat 1.12 (H) 0.60 - 0.88 mg/dL   BUN/Creatinine Ratio 26 (H) 6 - 22 (calc)   Sodium 139 135 - 146 mmol/L   Potassium 4.9 3.5 - 5.3 mmol/L   Chloride 108 98 - 110  mmol/L   CO2 23 20 - 32 mmol/L   Calcium 10.0 8.6 - 10.4 mg/dL   Total Protein 7.0 6.1 - 8.1 g/dL   Albumin 4.2 3.6 - 5.1 g/dL   Globulin 2.8 1.9 - 3.7 g/dL (calc)   AG Ratio 1.5 1.0 - 2.5 (calc)   Total Bilirubin 0.6 0.2 - 1.2 mg/dL   Alkaline phosphatase (APISO) 103 37 - 153 U/L   AST 17 10 - 35 U/L   ALT 9 6 - 29 U/L  CBC with Differential/Platelet  Result Value Ref Range   WBC 8.2 3.8 - 10.8 Thousand/uL   RBC 4.01 3.80 - 5.10 Million/uL   Hemoglobin 11.9 11.7 - 15.5 g/dL   HCT 36.3 35.0 - 45.0 %   MCV 90.5 80.0 - 100.0 fL   MCH 29.7 27.0 - 33.0 pg   MCHC 32.8 32.0 - 36.0 g/dL   RDW 13.5 11.0 - 15.0 %   Platelets 224 140 - 400 Thousand/uL   MPV 9.1 7.5 - 12.5 fL   Neutro Abs 4,469 1,500 - 7,800 cells/uL   Lymphs Abs 2,739 850 - 3,900 cells/uL   Absolute Monocytes 730 200 - 950 cells/uL   Eosinophils Absolute 246 15 - 500 cells/uL   Basophils Absolute 16 0 - 200 cells/uL  Neutrophils Relative % 54.5 %   Total Lymphocyte 33.4 %   Monocytes Relative 8.9 %   Eosinophils Relative 3.0 %   Basophils Relative 0.2 %  Lipid panel  Result Value Ref Range   Cholesterol 133 <200 mg/dL   HDL 67 > OR = 50 mg/dL   Triglycerides 81 <150 mg/dL   LDL Cholesterol (Calc) 50 mg/dL (calc)   Total CHOL/HDL Ratio 2.0 <5.0 (calc)   Non-HDL Cholesterol (Calc) 66 <130 mg/dL (calc)  C-reactive protein  Result Value Ref Range   CRP 6.1 <8.0 mg/L  TSH  Result Value Ref Range   TSH 0.02 (L) 0.40 - 4.50 mIU/L  Sed Rate (ESR)  Result Value Ref Range   Sed Rate 14 0 - 30 mm/h       Assessment & Plan:     ICD-10-CM   1. Encounter for examination following treatment at hospital  Z09 pantoprazole (PROTONIX) 40 MG tablet    meclizine (ANTIVERT) 25 MG tablet    Ambulatory referral to [place]   records reviewed through care everywhere - has neuro/neurosurgery f/up, needs HH orders and PT, labs next week    2. Vertigo  R42 meclizine (ANTIVERT) 25 MG tablet    Ambulatory referral to [place]   seems to only occur with getting up, not totally consistent with BPPV, neuro PT/vestibular PT eval/tx - [facility] through [facility] Can try antivert if dizzy multiple times, discussed dosing and sedation s/e with pt and family    3. Fall, initial encounter  W19.XXXA Ambulatory referral to [place]    4. SDH (subdural hematoma) ([place])  S06.5X9A Ambulatory referral to Home Health   imaging reviewed, stable, no surgery needed, pt with HA, but no focal neuro deficits and no seizure activity    5. Hypomagnesemia  E83.42 Ambulatory referral to Home Health   recheck labs next week and adjust supplement    6. Hyponatremia  E87.1 Ambulatory referral to Home Health   in hospital labs reviewed through care everywhere, sig lower than last labs in clinic and pt's baseline, recheck labs next week    7. Early satiety  R68.81 pantoprazole (PROTONIX) 40 MG tablet   may be upper GI?  trial of PPI, close f/up    8. Unintentional weight loss  R63.4    trial of ppi, will f/up in 3 weeks, once new acute issues begin to improve will do nutrition consult, add supplements if needed    9. Hyperparathyroidism, secondary renal ([place]) Chronic N25.81    per specialialists, recent labs and notes reviewed         Delsa Grana, PA-C 03/16/21 10:05 AM

## 2021-03-18 ENCOUNTER — Telehealth: Payer: Self-pay

## 2021-03-18 NOTE — Telephone Encounter (Signed)
Please advise patients daughter to which is best

## 2021-03-18 NOTE — Telephone Encounter (Signed)
Patient to come in on 6/28 with Malachy Mood

## 2021-03-18 NOTE — Telephone Encounter (Signed)
Copied from [place] [id number]. Topic: General - Inquiry >> Mar 18, 2021  9:37 AM Loma Boston wrote: Reason for CRM: pt daughter, Wellington [place], states mother was in to see [facility] yesterday and was to make a 2 wk FU. There was not anything available till 7/18 which we have made for 10:40. Diane wanted to make sure that was ok with Leisa. Also there is an appt with Malachy Mood for 6/28 and we were going to cancel but Daughter felt more comfortable keeping that also and the the 7/18 would not be so far out. Fu with Kristeen Miss regarding if these 2 appt are more appropriate instead of the one considering [phone] Diane.

## 2021-03-19 ENCOUNTER — Ambulatory Visit: Payer: PPO | Admitting: Physical Therapy

## 2021-03-22 ENCOUNTER — Encounter: Payer: PPO | Admitting: Physical Therapy

## 2021-03-23 ENCOUNTER — Telehealth: Payer: Self-pay | Admitting: *Deleted

## 2021-03-23 ENCOUNTER — Encounter: Payer: Self-pay | Admitting: Unknown Physician Specialty

## 2021-03-23 ENCOUNTER — Other Ambulatory Visit: Payer: Self-pay

## 2021-03-23 ENCOUNTER — Ambulatory Visit (INDEPENDENT_AMBULATORY_CARE_PROVIDER_SITE_OTHER): Payer: PPO | Admitting: Unknown Physician Specialty

## 2021-03-23 VITALS — BP 142/86 | HR 81 | Temp 97.7°F | Resp 14 | Ht 64.0 in | Wt 146.6 lb

## 2021-03-23 DIAGNOSIS — R519 Headache, unspecified: Secondary | ICD-10-CM

## 2021-03-23 DIAGNOSIS — Z139 Encounter for screening, unspecified: Secondary | ICD-10-CM

## 2021-03-23 DIAGNOSIS — M7918 Myalgia, other site: Secondary | ICD-10-CM | POA: Diagnosis not present

## 2021-03-23 DIAGNOSIS — G4700 Insomnia, unspecified: Secondary | ICD-10-CM

## 2021-03-23 NOTE — Chronic Care Management (AMB) (Signed)
  Chronic Care Management   Note  03/23/2021 Name: Caitlin Jenkins MRN: [id number] DOB: 26-Apr-1932  Caitlin Jenkins is a 87 y.o. year old female who is a primary care patient of Delsa Grana, [place]. I reached out to Caitlin Jenkins by phone today in response to a referral sent by Caitlin Jenkins's PCP, Delsa Grana, PA-C.      Caitlin Jenkins was given information about Chronic Care Management services today including:  CCM service includes personalized support from designated clinical staff supervised by her physician, including individualized plan of care and coordination with other care providers 24/7 contact phone numbers for assistance for urgent and routine care needs. Service will only be billed when office clinical staff spend 20 minutes or more in a month to coordinate care. Only one practitioner may furnish and bill the service in a calendar month. The patient may stop CCM services at any time (effective at the end of the month) by phone call to the office staff. The patient will be responsible for cost sharing (co-pay) of up to 20% of the service fee (after annual deductible is met).  Patient agreed to services and verbal consent obtained.   Follow up plan: Telephone appointment with care management team member scheduled for: RNCM on 03/31/2021 and Social Worker on 04/02/2021  [place]

## 2021-03-23 NOTE — Patient Instructions (Addendum)
Vitamin D 2,000 IUs in the morning  Melatonin 3 mg at night.    No overhead lights    Sunglasses when outside  Memory foam pads  Lidoderm patches  Magnesium - Citrate (Malate, Glycinate) - with melatonin    Chamomile tea with lemon balms  Blue light blocking glasses.    Arnica cream

## 2021-03-23 NOTE — Progress Notes (Signed)
BP (!) 142/86   Pulse 81   Temp 97.7 F (36.5 C)   Resp 14   Ht _0  (1.626 m)   Wt 146 lb 9.6 oz (66.5 kg)   SpO2 99%   BMI 25.16 kg/m    Subjective:    Patient ID: Caitlin Jenkins, female    DOB: January 23, 1932, 87 y.o.   MRN: [id number]  HPI: Caitlin Jenkins is a 87 y.o. female  Chief Complaint  Patient presents with   Follow-up   Pt is here to f/u from her head injury.  She is here with her 2 daughters.  They were last seen last week and scheduled for 2 weeks later.  She was hospitalized on 6/17 after noting a sub dural hematome.  This was stabilized without surgical management and discharged on 6/19.  Home PT was ordered by the hospital and through here last week but the family has not heard anything.  She was put on a short course of Keppra (6 days) which she has completed.  Visit today with daughters due to new concerns  Insomnia This is new.  Goes to bed easily but wakes up 2-3 times/night and often cannot get back to sleep.  Does not sleep during the day but sleepy here as daughter gave her 10 mg melatonin due to poor sleep last night  Headaches - After Keppra completed. Headaches are worse in the morning.  Headaches come and go and seem to be relieved by light changes.     Confusion - Noted by patient and not by daughters.  Able to play cards and games for short periods.    Buttock pain - Bruised during fall.  This is effecting her a great deal.    Relevant past medical, surgical, family and social history reviewed and updated as indicated. Interim medical history since our last visit reviewed. Allergies and medications reviewed and updated.  Review of Systems  Per HPI unless specifically indicated above     Objective:    BP (!) 142/86   Pulse 81   Temp 97.7 F (36.5 C)   Resp 14   Ht _1  (1.626 m)   Wt 146 lb 9.6 oz (66.5 kg)   SpO2 99%   BMI 25.16 kg/m   Wt Readings from Last 3 Encounters:  03/23/21 146 lb 9.6 oz (66.5 kg)  03/16/21 142 lb  12.8 oz (64.8 kg)  03/08/21 148 lb 6.4 oz (67.3 kg)    Physical Exam Constitutional:      General: She is not in acute distress.    Appearance: Normal appearance. She is well-developed.  HENT:     Head: Normocephalic and atraumatic.  Eyes:     General: Lids are normal. No scleral icterus.       Right eye: No discharge.        Left eye: No discharge.     Conjunctiva/sclera: Conjunctivae normal.  Neck:     Vascular: No carotid bruit or JVD.  Cardiovascular:     Rate and Rhythm: Normal rate and regular rhythm.     Heart sounds: Normal heart sounds.  Pulmonary:     Effort: Pulmonary effort is normal.     Breath sounds: Normal breath sounds.  Abdominal:     Palpations: There is no hepatomegaly or splenomegaly.  Musculoskeletal:        General: Normal range of motion.     Cervical back: Normal range of motion and neck supple.  Skin:  General: Skin is warm and dry.     Coloration: Skin is not pale.     Findings: No rash.  Neurological:     Mental Status: She is alert and oriented to person, place, and time.     Cranial Nerves: No cranial nerve deficit.     Comments: Sleepy but communicative  Psychiatric:        Behavior: Behavior normal.        Thought Content: Thought content normal.        Judgment: Judgment normal.    Results for orders placed or performed in visit on 02/18/21  Comprehensive metabolic panel  Result Value Ref Range   Glucose, Bld 95 65 - 99 mg/dL   BUN 29 (H) 7 - 25 mg/dL   Creat 1.12 (H) 0.60 - 0.88 mg/dL   BUN/Creatinine Ratio 26 (H) 6 - 22 (calc)   Sodium 139 135 - 146 mmol/L   Potassium 4.9 3.5 - 5.3 mmol/L   Chloride 108 98 - 110 mmol/L   CO2 23 20 - 32 mmol/L   Calcium 10.0 8.6 - 10.4 mg/dL   Total Protein 7.0 6.1 - 8.1 g/dL   Albumin 4.2 3.6 - 5.1 g/dL   Globulin 2.8 1.9 - 3.7 g/dL (calc)   AG Ratio 1.5 1.0 - 2.5 (calc)   Total Bilirubin 0.6 0.2 - 1.2 mg/dL   Alkaline phosphatase (APISO) 103 37 - 153 U/L   AST 17 10 - 35 U/L   ALT 9 6 -  29 U/L  CBC with Differential/Platelet  Result Value Ref Range   WBC 8.2 3.8 - 10.8 Thousand/uL   RBC 4.01 3.80 - 5.10 Million/uL   Hemoglobin 11.9 11.7 - 15.5 g/dL   HCT 36.3 35.0 - 45.0 %   MCV 90.5 80.0 - 100.0 fL   MCH 29.7 27.0 - 33.0 pg   MCHC 32.8 32.0 - 36.0 g/dL   RDW 13.5 11.0 - 15.0 %   Platelets 224 140 - 400 Thousand/uL   MPV 9.1 7.5 - 12.5 fL   Neutro Abs 4,469 1,500 - 7,800 cells/uL   Lymphs Abs 2,739 850 - 3,900 cells/uL   Absolute Monocytes 730 200 - 950 cells/uL   Eosinophils Absolute 246 15 - 500 cells/uL   Basophils Absolute 16 0 - 200 cells/uL   Neutrophils Relative % 54.5 %   Total Lymphocyte 33.4 %   Monocytes Relative 8.9 %   Eosinophils Relative 3.0 %   Basophils Relative 0.2 %  Lipid panel  Result Value Ref Range   Cholesterol 133 <200 mg/dL   HDL 67 > OR = 50 mg/dL   Triglycerides 81 <150 mg/dL   LDL Cholesterol (Calc) 50 mg/dL (calc)   Total CHOL/HDL Ratio 2.0 <5.0 (calc)   Non-HDL Cholesterol (Calc) 66 <130 mg/dL (calc)  C-reactive protein  Result Value Ref Range   CRP 6.1 <8.0 mg/L  TSH  Result Value Ref Range   TSH 0.02 (L) 0.40 - 4.50 mIU/L  Sed Rate (ESR)  Result Value Ref Range   Sed Rate 14 0 - 30 mm/h      Assessment & Plan:   Problem List Items Addressed This Visit   None Visit Diagnoses     Encounter for screening involving social determinants of health (SDoH)    -  Primary   Need PT and OT consult.  Will ask SW to follow up with that plus with daughters for caregiving resources- strong family support   Relevant Orders  AMB Referral to [facility]   Worsening headaches       Not intractable and seems to respond to decreasing light.  Avoid overhead lights, wear sunglasses, blue light glasses   Insomnia, unspecified type       New onset.  Melatonin 3 mg QHS - may titrate up.  Vid D in the AM, Mg in the evening.     Buttock pain       Secondary to injury.  Memory foam pad.  Lidoderm patches which daughter has.   Arnica cream        Has not heard from home PT and OT yet (scheduled last week).  Refer to nurse case management and social work.    Written instruction given  Follow up plan: Return for f/u next week.

## 2021-03-30 ENCOUNTER — Ambulatory Visit: Payer: Self-pay

## 2021-03-30 ENCOUNTER — Other Ambulatory Visit: Payer: Self-pay

## 2021-03-30 ENCOUNTER — Observation Stay: Payer: PPO

## 2021-03-30 ENCOUNTER — Emergency Department: Payer: PPO

## 2021-03-30 ENCOUNTER — Observation Stay
Admission: EM | Admit: 2021-03-30 | Discharge: 2021-04-01 | Disposition: A | Discharge status: Home / Self Care | Payer: PPO | Attending: Internal Medicine | Admitting: Internal Medicine

## 2021-03-30 DIAGNOSIS — S065X9A Traumatic subdural hemorrhage with loss of consciousness of unspecified duration, initial encounter: Secondary | ICD-10-CM | POA: Diagnosis present

## 2021-03-30 DIAGNOSIS — S065XAA Traumatic subdural hemorrhage with loss of consciousness status unknown, initial encounter: Secondary | ICD-10-CM | POA: Diagnosis present

## 2021-03-30 DIAGNOSIS — Z85828 Personal history of other malignant neoplasm of skin: Secondary | ICD-10-CM | POA: Diagnosis not present

## 2021-03-30 DIAGNOSIS — I129 Hypertensive chronic kidney disease with stage 1 through stage 4 chronic kidney disease, or unspecified chronic kidney disease: Secondary | ICD-10-CM | POA: Diagnosis not present

## 2021-03-30 DIAGNOSIS — Z79899 Other long term (current) drug therapy: Secondary | ICD-10-CM | POA: Insufficient documentation

## 2021-03-30 DIAGNOSIS — R4781 Slurred speech: Secondary | ICD-10-CM | POA: Diagnosis not present

## 2021-03-30 DIAGNOSIS — R4182 Altered mental status, unspecified: Secondary | ICD-10-CM | POA: Diagnosis not present

## 2021-03-30 DIAGNOSIS — G459 Transient cerebral ischemic attack, unspecified: Principal | ICD-10-CM | POA: Insufficient documentation

## 2021-03-30 DIAGNOSIS — Z20822 Contact with and (suspected) exposure to covid-19: Secondary | ICD-10-CM | POA: Insufficient documentation

## 2021-03-30 DIAGNOSIS — E785 Hyperlipidemia, unspecified: Secondary | ICD-10-CM | POA: Diagnosis present

## 2021-03-30 DIAGNOSIS — R2990 Unspecified symptoms and signs involving the nervous system: Secondary | ICD-10-CM | POA: Diagnosis not present

## 2021-03-30 DIAGNOSIS — N183 Chronic kidney disease, stage 3 unspecified: Secondary | ICD-10-CM | POA: Diagnosis present

## 2021-03-30 DIAGNOSIS — E039 Hypothyroidism, unspecified: Secondary | ICD-10-CM | POA: Diagnosis present

## 2021-03-30 DIAGNOSIS — I10 Essential (primary) hypertension: Secondary | ICD-10-CM | POA: Diagnosis not present

## 2021-03-30 DIAGNOSIS — E871 Hypo-osmolality and hyponatremia: Secondary | ICD-10-CM | POA: Insufficient documentation

## 2021-03-30 DIAGNOSIS — I779 Disorder of arteries and arterioles, unspecified: Secondary | ICD-10-CM | POA: Diagnosis present

## 2021-03-30 DIAGNOSIS — I6200 Nontraumatic subdural hemorrhage, unspecified: Secondary | ICD-10-CM | POA: Diagnosis not present

## 2021-03-30 DIAGNOSIS — R519 Headache, unspecified: Secondary | ICD-10-CM | POA: Diagnosis not present

## 2021-03-30 DIAGNOSIS — K743 Primary biliary cirrhosis: Secondary | ICD-10-CM | POA: Diagnosis present

## 2021-03-30 DIAGNOSIS — E663 Overweight: Secondary | ICD-10-CM | POA: Diagnosis present

## 2021-03-30 DIAGNOSIS — E440 Moderate protein-calorie malnutrition: Secondary | ICD-10-CM | POA: Insufficient documentation

## 2021-03-30 LAB — COMPREHENSIVE METABOLIC PANEL
ALT: 12 U/L (ref 0–44)
AST: 27 U/L (ref 15–41)
Albumin: 4.4 g/dL (ref 3.5–5.0)
Alkaline Phosphatase: 129 U/L — ABNORMAL HIGH (ref 38–126)
Anion gap: 7 (ref 5–15)
BUN: 19 mg/dL (ref 8–23)
CO2: 24 mmol/L (ref 22–32)
Calcium: 9.9 mg/dL (ref 8.9–10.3)
Chloride: 95 mmol/L — ABNORMAL LOW (ref 98–111)
Creatinine, Ser: 1.15 mg/dL — ABNORMAL HIGH (ref 0.44–1.00)
GFR, Estimated: 46 mL/min — ABNORMAL LOW (ref >60)
Glucose, Bld: 117 mg/dL — ABNORMAL HIGH (ref 70–99)
Potassium: 4.4 mmol/L (ref 3.5–5.1)
Sodium: 126 mmol/L — ABNORMAL LOW (ref 135–145)
Total Bilirubin: 0.9 mg/dL (ref 0.3–1.2)
Total Protein: 7.7 g/dL (ref 6.5–8.1)

## 2021-03-30 LAB — URINALYSIS, COMPLETE (UACMP) WITH MICROSCOPIC
Bacteria, UA: NONE SEEN
Bilirubin Urine: NEGATIVE
Glucose, UA: NEGATIVE mg/dL
Hgb urine dipstick: NEGATIVE
Ketones, ur: NEGATIVE mg/dL
Nitrite: NEGATIVE
Protein, ur: NEGATIVE mg/dL
Specific Gravity, Urine: 1.008 (ref 1.005–1.030)
pH: 7 (ref 5.0–8.0)

## 2021-03-30 LAB — RESP PANEL BY RT-PCR (FLU A&B, COVID) ARPGX2
Influenza A by PCR: NEGATIVE
Influenza B by PCR: NEGATIVE
SARS Coronavirus 2 by RT PCR: NEGATIVE

## 2021-03-30 LAB — CBC
HCT: 38.4 % (ref 36.0–46.0)
Hemoglobin: 13 g/dL (ref 12.0–15.0)
MCH: 30.2 pg (ref 26.0–34.0)
MCHC: 33.9 g/dL (ref 30.0–36.0)
MCV: 89.3 fL (ref 80.0–100.0)
Platelets: 254 10*3/uL (ref 150–400)
RBC: 4.3 MIL/uL (ref 3.87–5.11)
RDW: 13.3 % (ref 11.5–15.5)
WBC: 9.2 10*3/uL (ref 4.0–10.5)
nRBC: 0 % (ref 0.0–0.2)

## 2021-03-30 LAB — TSH: TSH: 3.411 u[IU]/mL (ref 0.350–4.500)

## 2021-03-30 LAB — SODIUM, URINE, RANDOM: Sodium, Ur: 49 mmol/L

## 2021-03-30 LAB — OSMOLALITY, URINE: Osmolality, Ur: 258 mOsm/kg — ABNORMAL LOW (ref 300–900)

## 2021-03-30 LAB — URIC ACID: Uric Acid, Serum: 5.7 mg/dL (ref 2.5–7.1)

## 2021-03-30 LAB — T4, FREE: Free T4: 1.21 ng/dL — ABNORMAL HIGH (ref 0.61–1.12)

## 2021-03-30 LAB — OSMOLALITY: Osmolality: 272 mOsm/kg — ABNORMAL LOW (ref 275–295)

## 2021-03-30 MED ORDER — URSODIOL 300 MG PO CAPS
300.0000 mg | ORAL_CAPSULE | Freq: Every day | ORAL | Status: DC
  Administered 2021-03-31 – 2021-04-01 (×2): 300 mg via ORAL
  Filled 2021-03-30 (×2): qty 1

## 2021-03-30 MED ORDER — THIAMINE HCL 100 MG PO TABS
100.0000 mg | ORAL_TABLET | Freq: Every day | ORAL | Status: DC
  Administered 2021-03-31 – 2021-04-01 (×2): 100 mg via ORAL
  Filled 2021-03-30 (×2): qty 1

## 2021-03-30 MED ORDER — MECLIZINE HCL 25 MG PO TABS
12.5000 mg | ORAL_TABLET | Freq: Three times a day (TID) | ORAL | Status: DC | PRN
Start: 2021-03-30 — End: 2021-04-01
  Filled 2021-03-30: qty 1

## 2021-03-30 MED ORDER — SODIUM CHLORIDE 0.9 % IV SOLN: INTRAVENOUS | Status: AC

## 2021-03-30 MED ORDER — HYDRALAZINE HCL 50 MG PO TABS: 25.0000 mg | ORAL_TABLET | Freq: Four times a day (QID) | ORAL | Status: DC | PRN

## 2021-03-30 MED ORDER — ASCORBIC ACID 500 MG PO TABS
1000.0000 mg | ORAL_TABLET | Freq: Two times a day (BID) | ORAL | Status: DC
  Administered 2021-03-30 – 2021-04-01 (×4): 1000 mg via ORAL
  Filled 2021-03-30 (×4): qty 2

## 2021-03-30 MED ORDER — AMLODIPINE BESYLATE 5 MG PO TABS
2.5000 mg | ORAL_TABLET | Freq: Every day | ORAL | Status: DC
  Administered 2021-03-30 – 2021-04-01 (×3): 2.5 mg via ORAL
  Filled 2021-03-30 (×3): qty 1

## 2021-03-30 MED ORDER — STROKE: EARLY STAGES OF RECOVERY BOOK: Freq: Once | Status: AC

## 2021-03-30 MED ORDER — LACTATED RINGERS IV BOLUS
500.0000 mL | Freq: Once | INTRAVENOUS | Status: AC
  Administered 2021-03-30: 500 mL via INTRAVENOUS

## 2021-03-30 MED ORDER — LOSARTAN POTASSIUM 50 MG PO TABS
100.0000 mg | ORAL_TABLET | Freq: Every day | ORAL | Status: DC
  Administered 2021-03-31 – 2021-04-01 (×2): 100 mg via ORAL
  Filled 2021-03-30 (×2): qty 2

## 2021-03-30 MED ORDER — GADOBUTROL 1 MMOL/ML IV SOLN
6.0000 mL | Freq: Once | INTRAVENOUS | Status: AC | PRN
  Administered 2021-03-30: 6 mL via INTRAVENOUS

## 2021-03-30 MED ORDER — ATORVASTATIN CALCIUM 20 MG PO TABS
10.0000 mg | ORAL_TABLET | Freq: Every day | ORAL | Status: DC
  Administered 2021-03-30 – 2021-03-31 (×2): 10 mg via ORAL
  Filled 2021-03-30 (×2): qty 1

## 2021-03-30 MED ORDER — ACETAMINOPHEN 650 MG RE SUPP: 650.0000 mg | RECTAL | Status: DC | PRN

## 2021-03-30 MED ORDER — LEVOTHYROXINE SODIUM 50 MCG PO TABS
75.0000 ug | ORAL_TABLET | Freq: Every day | ORAL | Status: DC
  Administered 2021-03-31 – 2021-04-01 (×2): 75 ug via ORAL
  Filled 2021-03-30 (×2): qty 1

## 2021-03-30 MED ORDER — COLCHICINE 0.6 MG PO TABS
0.6000 mg | ORAL_TABLET | Freq: Every day | ORAL | Status: DC | PRN
  Filled 2021-03-30: qty 1

## 2021-03-30 MED ORDER — PANTOPRAZOLE SODIUM 40 MG PO TBEC
40.0000 mg | DELAYED_RELEASE_TABLET | Freq: Every day | ORAL | Status: DC
  Filled 2021-03-30 (×2): qty 1

## 2021-03-30 MED ORDER — CYANOCOBALAMIN 500 MCG PO TABS
250.0000 ug | ORAL_TABLET | Freq: Every day | ORAL | Status: DC
  Administered 2021-03-31 – 2021-04-01 (×2): 250 ug via ORAL
  Filled 2021-03-30 (×2): qty 1

## 2021-03-30 MED ORDER — ACETAMINOPHEN 325 MG PO TABS: 650.0000 mg | ORAL_TABLET | ORAL | Status: DC | PRN

## 2021-03-30 MED ORDER — DIPHENHYDRAMINE HCL 25 MG PO CAPS
25.0000 mg | ORAL_CAPSULE | Freq: Every evening | ORAL | Status: DC | PRN
  Administered 2021-03-30 – 2021-03-31 (×2): 25 mg via ORAL
  Filled 2021-03-30 (×2): qty 1

## 2021-03-30 MED ORDER — ACETAMINOPHEN 160 MG/5ML PO SOLN
650.0000 mg | ORAL | Status: DC | PRN
  Filled 2021-03-30: qty 20.3

## 2021-03-30 NOTE — Telephone Encounter (Signed)
2 weeks ago pt fell backward onto  concrete. Pt hit the back of her head  and was dx with subdural hematoma had brain bleed [facility] 3 days ICU dc 'd Scheduled CT scan 4 weeks after accident Is improving.  Yesterday pm was doing a sodoku puzzle and completed.  For fifteen minutes speech slurred couldn't find the right words. Smile was even, walking normal. Couldn't  remember name, time place. Couldn't remember her friend and her friends son. Right eye  vision worse wasn't allowing to see the whole sentence.  1 hour later, pt remembered  names, day of the week, place and was back to baseline. Called [facility] Nurse came out this am 134/81, 166/....  Wants to get CT scan earlier. [facility]. Nurse did neurological exam and took pt's BP: 134/78   175/87 144/92 Care advice given . Called [facility] and spoke to nurse [place]. Paticia Stack of triage and asked if she needs appt first or could I go ahead and tell pt that she needs to go to ED. Roselyn Reef spoke to provider who advised pt to go to ED.  Call form pt dropped. Roselyn Reef stated she will call back to speak to [facility] who is with pt to decide which ED to go to .  Reason for Disposition  [1] ACUTE NEURO SYMPTOM AND [2] now fine  (DEFINITION: difficult to awaken OR confused thinking and talking OR slurred speech OR weakness of arms OR unsteady walking)  Answer Assessment - Initial Assessment Questions 1. MECHANISM: "How did the injury happen?" For falls, ask: "What height did you fall from?" and "What surface did you fall against?"      *No Answer* 2. ONSET: "When did the injury happen?" (Minutes or hours ago)      3. NEUROLOGIC SYMPTOMS: "Was there any loss of consciousness?" "Are there any other neurological symptoms?"      High step on concrete camper from a raised position 4. MENTAL STATUS: "Does the person know who they are, who you are, and where they are?"   For 15 minutes had slurred, caould not recall words.  5. LOCATION: "What  part of the head was hit?"      Back of head 6. SCALP APPEARANCE: "What does the scalp look like? Is it bleeding now?" If Yes, ask: "Is it difficult to stop?"      Swelling and small amount of blood 7. SIZE: For cuts, bruises, or swelling, ask: "How large is it?" (e.g., inches or centimeters)      unsure 8. PAIN: "Is there any pain?" If Yes, ask: "How bad is it?"  (e.g., Scale 1-10; or mild, moderate, severe)     Buttock pain 9. TETANUS: For any breaks in the skin, ask: "When was the last tetanus booster?"   unknown 10. OTHER SYMPTOMS: "Do you have any other symptoms?" (e.g., neck pain, vomiting)       No current symptoms  Protocols used: Head Injury-A-AH

## 2021-03-30 NOTE — ED Triage Notes (Signed)
Pt fell and had a subdural bleed 6/17, pt states she is having new sx with difficulty with thought process and speech since last night. Denies any pain

## 2021-03-30 NOTE — H&P (Addendum)
History and Physical   Caitlin Jenkins:[id number] DOB: 11-Nov-1931 DOA: 03/30/2021  PCP: Delsa Grana, PA-C  Outpatient Specialists: Dr. Juleen China, [facility] Patient coming from: home via EMS  I have personally briefly reviewed patient's old medical records in [place].  Chief Concern: stroke-like symptoms  HPI: Caitlin Jenkins is a 87 y.o. female with medical history significant for primary hypertension, hypothyroid, hyperlipidemia, history of subdural hematoma secondary to trauma, presents to the emergency department for chief concerns of difficulty speaking.  Patient was playing sudoku and had completed her surgical glue at approximately 6:45 PM on 03/29/2021.  Patient developed difficulty speaking and difficulty breathing.  Daughter at bedside who was present during this time reports that patient was able to tell daughter her name, age, current calendar year, name of all of her children's name and grandchildren's name.  Patient did not have any difficulty walking or facial drooping or visual evidence of seizure activity.  Patient did not have difficulty with bowel or urinary incontinence.  Patient's difficulty with verbal expression and reading lasted approximately 30 to 45 minutes.  There was no fever, vomiting.  Prior to the trauma and subdural hematoma on 03/12/2021 patient has never experienced the symptoms before.  Social history: She lives at home by herself previously and was able to perform her own ADLs.  Now her daughter Caitlin Jenkins stays with her to help her out around the house.  Patient was never a tobacco user, denies EtOH and recreational drug use.  ROS: Constitutional: no weight change, no fever ENT/Mouth: no sore throat, no rhinorrhea Eyes: no eye pain, no vision changes Cardiovascular: no chest pain, no dyspnea,  no edema, no palpitations Respiratory: no cough, no sputum, no wheezing Gastrointestinal: no nausea, no vomiting, no diarrhea, no  constipation Genitourinary: no urinary incontinence, no dysuria, no hematuria Musculoskeletal: no arthralgias, no myalgias Skin: no skin lesions, no pruritus, Neuro: + weakness, no loss of consciousness, no syncope Psych: no anxiety, no depression,  bo decrease appetite Heme/Lymph: no bruising, no bleeding  ED Course: Discussed with EDP, patient requiring hospitalization for new strokelike symptoms.  Vitals in the emergency department was remarkable for temperature of 98.2, heart rate of 85, blood pressure 182/86, SPO2 of 98% on room air.  Labs in the emergency department was remarkable for sodium 126, potassium 4.4, chloride 95, bicarb 24, BUN 19, serum creatinine of 1.15, nonfasting blood glucose 117, alk phos 129, WBC of 9.2, hemoglobin 13, platelets 254.  CT the head without contrast ordered by emergency medicine provider showed a small isodense left-sided subdural collection.  This corresponds with the known subdural hematoma from the previous report on 03/13/2021.  Based on the previous report, this subdural collection continues to decrease in size.  No significant midline shift.  Emergency medicine provider gave patient amlodipine 2.5 mg p.o., lactated ringer 500 mL bolus.  Assessment/Plan  Principal Problem:   Stroke-like symptoms Active Problems:   Hyperlipidemia   Hypothyroidism   CKD (chronic kidney disease) stage 3, GFR 30-59 ml/min (HCC)   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HCC)   Essential hypertension, benign   Carotid artery disease (HCC)   Overweight (BMI 25.0-29.9)   Primary biliary cholangitis (HCC)   History of basal cell carcinoma (BCC)   SDH (subdural hematoma) (HCC)   TIA (transient ischemic attack)   # Strokelike symptoms-expressive aphasia # History of subdural hematoma - Focal seizure cannot be excluded at this time given history of subdural hematoma - CT head without  contrast read as above - Neurology has been consulted, Dr. Curly Shores  says neurology service will follow patient on 03/31/2021 - MRI of the brain - Neurologist recommends: MRA of the head without contrast, MRA of the neck with contrast - EEG ordered, B12 a.m. lab ordered - Fasting lipid and A1c ordered - Blood pressure goal, maintain SPB < 160 - Hydralazine 25 mg p.o. every 6 hours as needed for SBP greater than 160, 3 doses ordered - Frequent neuro vascular checks - N.p.o. pending swallow study - Fall precaution   # CT read of possible superimposed infection/empyema on the redemonstrated subacute subdural hematoma overlying the left cerebral hemisphere measuring 10 mm in thickness.  There is apparent restricted diffusion within the collection, which may reflect susceptibility artifact from blood products. - Neurology recommends to consult neurosurgery for further recommendation - A.m. team to consult neurosurgery  # Mild acute on chronic hyponatremia-SIADH cannot be excluded at this time - Baseline serum sodium level is 127-129 - Sodium level on admission is 126 - Normal saline 125 mL/h, 8 hours ordered - Urine osmolality, urine sodium, serum uric acid ordered - BMP in a.m.  # History of subdural hematoma-was discharged home with Keppra 500 mg twice daily starting 03/14/2021 to 03/20/2021 for seizure prophylaxis - Patient has completed course of treatment  # Low TSH value on 5/26-we will check TSH today and T4 - TSH on 02/18/2021 was 0.02  # Hyperlipidemia-atorvastatin 10 mg nightly # Hypothyroid-levothyroxine 75 mcg in the a.m. # Primary hypertension-Home medications include amlodipine 2.5 mg daily, losartan 100 mg daily # CAD-hyperlipidemia as above, treat hypertension as above # Primary biliary cholangitis-resumed home ursodiol 300 mg daily # CKD 3A/B-at baseline  COVID PCR/influenza a and B PCR are pending  Chart reviewed.   Hospitalization at [facility] from 03/12/2021 to 03/14/2021 for subdural hematoma secondary to trauma, patient  falling backwards. Patient was discharged home with Keppra 500 mg twice daily to complete treatment on 03/20/2021. Patient was scheduled for repeat imaging in 4 weeks  DVT prophylaxis: SCDs Code Status: Full code Diet: Heart healthy Family Communication: Updated daughter, Caitlin Jenkins at bedside Disposition Plan: Pending clinical course Consults called: Neurology Admission status: MedSurg, telemetry, observation  Past Medical History:  Diagnosis Date   Biliary cirrhosis ([place])    Cancer of skin of leg    Cellulitis    CKD (chronic kidney disease) stage 3, GFR 30-59 ml/min ([place])    Gout    Hiatal hernia Oct 2015   8.5cm   Hiatal hernia 06/26/2014   8.5cm    History of basal cell carcinoma ([place]) 09/12/2018   Oct 2016; Moh's surgery; right antitragus   History of diverticulitis 2000   Hyperlipidemia    Hyperparathyroidism, secondary renal ([place]) 07/11/2018   Managed by nephrologist   Hypertension    Hypomagnesemia 07/03/2019   Hypothyroidism    Skin cancer of nose    Past Surgical History:  Procedure Laterality Date   ABDOMINAL HYSTERECTOMY  1995   CATARACT EXTRACTION     FOOT SURGERY  2000   HEMORRHOID SURGERY     THYROID SURGERY  1999   para thyroid   Social History:  reports that she has never smoked. She has never used smokeless tobacco. She reports that she does not drink alcohol and does not use drugs.  Allergies  Allergen Reactions   Indomethacin Hives   Ace Inhibitors Swelling    Other reaction(s): SWELLING Facial swelling. Facial swelling.   Augmentin [Amoxicillin-Pot Clavulanate]  Other (See Comments)    Other reaction(s): Unknown   Elemental Sulfur Other (See Comments)   Levofloxacin In D5w Other (See Comments)    Tendinopathy and myalgia   Levothyroxine Sodium Other (See Comments)    Severe muscle weakness and tendinopathy   Penicillins Nausea And Vomiting    Has patient had a PCN reaction causing immediate rash, facial/tongue/throat swelling, SOB or  lightheadedness with hypotension: Yes Has patient had a PCN reaction causing severe rash involving mucus membranes or skin necrosis: No Has patient had a PCN reaction that required hospitalization: No Has patient had a PCN reaction occurring within the last 10 years: No If all of the above answers are "NO", then may proceed with Cephalosporin use.   Sulfa Antibiotics Swelling and Other (See Comments)   Family History  Problem Relation Age of Onset   Emphysema Father    Asthma Father    Hypertension Daughter    Heart disease Daughter        3 stents   Diabetes Daughter    Hypertension Son    Cancer Son        prostate   Diabetes Son    Heart disease Daughter        heart attack, 2 stents   Multiple sclerosis Daughter    Diabetes Daughter    Fibromyalgia Daughter    Diabetes Daughter    Family history: Family history reviewed and not pertinent  Prior to Admission medications   Medication Sig Start Date End Date Taking? Authorizing Provider  acetaminophen (TYLENOL) 325 MG tablet Take 650 mg by mouth 2 (two) times daily.    [provider]  amLODipine (NORVASC) 2.5 MG tablet Take 1 tablet (2.5 mg total) by mouth daily. 02/18/21   Kathrine Haddock, NP  Ascorbic Acid (VITAMIN C) 1000 MG tablet Take 1,000 mg by mouth 2 (two) times a day.    [provider]  atorvastatin (LIPITOR) 10 MG tablet Take 1 tablet (10 mg total) by mouth at bedtime. (do not take for 3 days after taking colchicine) 02/18/21   Kathrine Haddock, NP  Cholecalciferol (VITAMIN D-3) 25 MCG (1000 UT) CAPS Take 1 capsule by mouth daily.    [provider]  COLACE 100 MG capsule Take 100 mg by mouth daily.  01/26/15   [provider]  Colchicine 0.6 MG CAPS Take 0.6 mg by mouth daily as needed. 02/25/21   Kathrine Haddock, NP  levETIRAcetam (KEPPRA) 500 MG tablet Take 500 mg by mouth 2 (two) times daily. 03/15/21   [provider]  levofloxacin (LEVAQUIN) 500 MG tablet Take 500 mg by mouth  daily. 02/01/21   [provider]  levothyroxine (SYNTHROID) 75 MCG tablet Take 1 tablet (75 mcg total) by mouth daily. 02/24/21   Kathrine Haddock, NP  losartan (COZAAR) 100 MG tablet Take 1 tablet by mouth daily (Avoid salt substitutes, no fruit smoothies.) 02/18/21   Kathrine Haddock, NP  meclizine (ANTIVERT) 25 MG tablet Take 0.5-1 tablets (12.5-25 mg total) by mouth 3 (three) times daily as needed for dizziness. 03/16/21   Delsa Grana, PA-C  niacinamide 500 MG tablet Take 1 tablet (500 mg total) by mouth 2 (two) times daily with a meal. 11/13/17   Lada, Satira Anis, MD  nitrofurantoin, macrocrystal-monohydrate, (MACROBID) 100 MG capsule Take 100 mg by mouth 2 (two) times daily. 02/04/21   [provider]  OVER THE COUNTER MEDICATION Take 1 capsule by mouth daily. CBD oil (Active Gold Label)    [provider]  pantoprazole (PROTONIX) 40 MG tablet Take 1 tablet (40 mg total) by mouth at bedtime. 03/16/21   Delsa Grana, PA-C  Probiotic Product (PROBIOTIC DAILY PO) Take by mouth.    [provider]  thiamine 100 MG tablet Take 100 mg by mouth daily.    [provider]  Turmeric 450 MG CAPS Take 500 mg by mouth daily.     [provider]  ursodiol (ACTIGALL) 300 MG capsule Take 1 capsule (300 mg total) by mouth daily. 02/10/21   Lucilla Lame, MD  vitamin B-12 (CYANOCOBALAMIN) 250 MCG tablet Take 250 mcg by mouth daily.    [provider]  zinc gluconate 50 MG tablet Take 50 mg by mouth daily.    [provider]   Physical Exam: Vitals:   03/30/21 1253 03/30/21 1255  BP: (!) 185/101 (!) 182/86  Pulse: 85   Resp: 18   Temp: 98.2 F (36.8 C)   TempSrc: Oral   SpO2: 98%   Weight: 64.4 kg   Height: 5' 4"  (1.626 m)    Constitutional: appears age-appropriate, NAD, calm, comfortable Eyes: PERRL, lids and conjunctivae normal ENMT: Mucous membranes are moist. Posterior pharynx clear of any exudate or lesions. Age-appropriate dentition.   Mild to moderate hearing loss Neck: normal, supple, no masses, no thyromegaly Respiratory: clear to auscultation bilaterally, no wheezing, no crackles. Normal respiratory effort. No accessory muscle use.  Cardiovascular: Regular rate and rhythm, no murmurs / rubs / gallops. No extremity edema. 2+ pedal pulses. No carotid bruits.  Abdomen: no tenderness, no masses palpated, no hepatosplenomegaly. Bowel sounds positive.  Musculoskeletal: no clubbing / cyanosis. No joint deformity upper and lower extremities. Good ROM, no contractures, no atrophy. Normal muscle tone.  Skin: no rashes, lesions, ulcers. No induration Neurologic: Sensation intact. Strength 5/5 in all 4.  Psychiatric: Normal judgment and insight. Alert and oriented x 3. Normal mood.   EKG: independently reviewed, showing sinus rhythm with rate of 78, QTc 456  Chest x-ray on Admission: I personally reviewed and I agree with radiologist reading as below.  CT Head Wo Contrast  Result Date: 03/30/2021 CLINICAL DATA:  Headache. Evaluate for intracranial hemorrhage. Fall 2 weeks ago and history of subdural hematoma. EXAM: CT HEAD WITHOUT CONTRAST TECHNIQUE: Contiguous axial images were obtained from the base of the skull through the vertex without intravenous contrast. COMPARISON:  CT face 08/28/2008 and CT report from [place] dated 03/13/2021 FINDINGS: Brain: Subtle effacement of the sulci along the left temporal lobe and left parietal lobe region. Evidence for a small isodense subdural collection along the left convexity. This subdural collection measures roughly 3-4 mm. No evidence for midline shift. No hydrocephalus. No large area of infarct. Small amount of low-density in the periventricular white matter is suggestive for chronic changes. Vascular: No hyperdense vessel or unexpected calcification. Skull: Normal. Negative for fracture or focal lesion. Sinuses/Orbits: Again noted is a small osteoma at the junction of  the left frontal sinus and ethmoid air cells. Other: None IMPRESSION: Small isodense left-sided subdural collection. This corresponds with the known subdural hematoma from the previous report on 03/13/2021. Based on the previous report, this subdural collection continues to decrease in size. No significant midline shift. These results were called by telephone at the time of interpretation on 03/30/2021 at 1:53 pm to provider [facility] , who verbally acknowledged these results. Electronically Signed   By: Markus Daft M.D.   On: 03/30/2021 13:53    Labs  on Admission: I have personally reviewed following labs  CBC: Recent Labs  Lab 03/30/21 1254  WBC 9.2  HGB 13.0  HCT 38.4  MCV 89.3  PLT 517   Basic Metabolic Panel: Recent Labs  Lab 03/30/21 1254  NA 126*  K 4.4  CL 95*  CO2 24  GLUCOSE 117*  BUN 19  CREATININE 1.15*  CALCIUM 9.9   GFR: Estimated Creatinine Clearance: 29.8 mL/min (A) (by C-G formula based on SCr of 1.15 mg/dL (H)).  Liver Function Tests: Recent Labs  Lab 03/30/21 1254  AST 27  ALT 12  ALKPHOS 129*  BILITOT 0.9  PROT 7.7  ALBUMIN 4.4   Urine analysis:    Component Value Date/Time   COLORURINE STRAW (A) 02/23/2019 1709   APPEARANCEUR CLEAR (A) 02/23/2019 1709   LABSPEC 1.010 02/23/2019 1709   PHURINE 6.0 02/23/2019 1709   GLUCOSEU NEGATIVE 02/23/2019 1709   HGBUR NEGATIVE 02/23/2019 [place] 02/23/2019 1709   BILIRUBINUR Small 05/11/2018 1152   KETONESUR NEGATIVE 02/23/2019 1709   PROTEINUR 100 (A) 02/23/2019 1709   UROBILINOGEN 0.2 05/11/2018 1152   NITRITE NEGATIVE 02/23/2019 1709   LEUKOCYTESUR SMALL (A) 02/23/2019 1709   Dr. Tobie Poet [facility]  If 7PM-7AM, please contact overnight-coverage provider If 7AM-7PM, please contact day coverage provider [email or web address]  03/30/2021, 2:19 PM

## 2021-03-30 NOTE — Plan of Care (Signed)
Patient discussed briefly with Dr. Tobie Poet.  In brief she is an 87 year old woman who recently suffered a right hemispheric subdural hemorrhage in the setting of trauma approximately 2 weeks ago, who has completed her standard 7-day course of Keppra, now presenting with a transient episode of difficulty with speech lasting 30 to 40 minutes yesterday.  She is otherwise back to her neurologic baseline, afebrile but hypertensive.  Recommended MRI brain and MRA head and neck for initial work-up.  Also recommended work-up and appropriate treatment of hyponatremia as goal is normonatremia for this patient.  Discussed that hyponatremia could be lowering seizure threshold, with a less likely etiology of her symptoms being stroke/TIA.  Routine EEG should be obtained tomorrow.  Defer to neurosurgery for blood pressure goals in the setting of subacute subdural hemorrhage, but note that she was normotensive on discharge from her last hospitalization.  No neurological indication for permissive hypertension.  Ideally would obtain EEG tomorrow off of antiseizure medications, but if patient has recurrent events tonight please load with Keppra 1000 mg followed by 500 mg twice daily standing dose given patient's creatinine clearance of approximately 30 mL/min

## 2021-03-30 NOTE — ED Provider Notes (Signed)
-----------------------------------------   4:59 PM on 03/30/2021 -----------------------------------------  I received verbal report from radiology about additional small areas of hemorrhage seen on the patient's MRs that were not seen on the initial CT.  The patient had already been admitted to the hospitalist service.  I contacted Dr. Tobie Poet via secure chat and she acknowledged receipt of this information.    Arta Silence, MD 03/30/21 1700

## 2021-03-30 NOTE — ED Provider Notes (Addendum)
[facility] Emergency Department Provider Note  ____________________________________________   Event Date/Time   First MD Initiated Contact with Patient 03/30/21 1320     (approximate)  I have reviewed the triage vital signs and the nursing notes.   HISTORY  Chief Complaint Altered Mental Status   HPI Caitlin Jenkins is a 87 y.o. female with a past medical history of CKD, gout, hiatal hernia, HTN, HDL, hypothyroidism and subdural after a fall on 6/17 managed conservatively who presents for assessment of an episode of difficulty with her memory and speech started around 6 PM yesterday evening and last about 30 minutes.  Patient states she lives alone but her daughter has been staying with her.  She states they are worried about a mini stroke and wanted to get checked out today.  She states her symptoms resolved and she has not had any subsequent difficulties with her speech or memory and did not recall any focal extremity weakness numbness tingling vertigo or vision changes.  States she still has a little bit of a headache since being discharged after her fall but has not had any new head pain or subsequent injuries.  She denies any fevers, chills, cough, chest pain, shortness of breath, abdominal pain, back pain, rash or urinary symptoms or any other recent sick symptoms.  No other acute concerns at this time.  She does not recall any history of stroke.         Past Medical History:  Diagnosis Date   Biliary cirrhosis ([place])    Cancer of skin of leg    Cellulitis    CKD (chronic kidney disease) stage 3, GFR 30-59 ml/min (HCC)    Gout    Hiatal hernia Oct 2015   8.5cm   Hiatal hernia 06/26/2014   8.5cm    History of basal cell carcinoma (BCC) 09/12/2018   Oct 2016; Moh's surgery; right antitragus   History of diverticulitis 2000   Hyperlipidemia    Hyperparathyroidism, secondary renal ([place]) 07/11/2018   Managed by nephrologist   Hypertension     Hypomagnesemia 07/03/2019   Hypothyroidism    Skin cancer of nose     Patient Active Problem List   Diagnosis Date Noted   Stroke-like symptoms 03/30/2021   Fall 03/14/2021   SDH (subdural hematoma) ([place]) 03/14/2021   Trauma 03/13/2021   History of basal cell carcinoma (BCC) 09/12/2018   Hyperparathyroidism, secondary renal ([place]) 07/11/2018   Primary biliary cholangitis ([place]) 03/20/2017   Overweight (BMI 25.0-29.9) 11/29/2015   Carotid artery disease ([place]) 10/09/2015   Nail abnormalities 10/09/2015   Arthralgia of multiple joints 10/09/2015   Essential hypertension, benign 06/19/2015   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 Authorizing Provider  acetaminophen (TYLENOL) 325 MG tablet Take 650 mg by mouth 2 (two) times daily.    [provider]  amLODipine (NORVASC) 2.5 MG tablet Take 1 tablet (2.5 mg total) by mouth daily. 02/18/21   Kathrine Haddock, NP  Ascorbic Acid (  VITAMIN C) 1000 MG tablet Take 1,000 mg by mouth 2 (two) times a day.    [provider]  atorvastatin (LIPITOR) 10 MG tablet Take 1 tablet (10 mg total) by mouth at bedtime. (do not take for 3 days after taking colchicine) 02/18/21   Kathrine Haddock, NP  Cholecalciferol (VITAMIN D-3) 25 MCG (1000 UT) CAPS Take 1 capsule by mouth daily.    [provider]  COLACE 100 MG capsule Take 100 mg by mouth daily.  01/26/15   [provider]  Colchicine 0.6 MG CAPS Take 0.6 mg by mouth daily as needed. 02/25/21   Kathrine Haddock, NP  levETIRAcetam (KEPPRA) 500 MG tablet Take 500 mg by mouth  2 (two) times daily. 03/15/21   [provider]  levofloxacin (LEVAQUIN) 500 MG tablet Take 500 mg by mouth daily. 02/01/21   [provider]  levothyroxine (SYNTHROID) 75 MCG tablet Take 1 tablet (75 mcg total) by mouth daily. 02/24/21   Kathrine Haddock, NP  losartan (COZAAR) 100 MG tablet Take 1 tablet by mouth daily (Avoid salt substitutes, no fruit smoothies.) 02/18/21   Kathrine Haddock, NP  meclizine (ANTIVERT) 25 MG tablet Take 0.5-1 tablets (12.5-25 mg total) by mouth 3 (three) times daily as needed for dizziness. 03/16/21   Delsa Grana, PA-C  niacinamide 500 MG tablet Take 1 tablet (500 mg total) by mouth 2 (two) times daily with a meal. 11/13/17   Lada, Satira Anis, MD  nitrofurantoin, macrocrystal-monohydrate, (MACROBID) 100 MG capsule Take 100 mg by mouth 2 (two) times daily. 02/04/21   [provider]  OVER THE COUNTER MEDICATION Take 1 capsule by mouth daily. CBD oil (Active Gold Label)    [provider]  pantoprazole (PROTONIX) 40 MG tablet Take 1 tablet (40 mg total) by mouth at bedtime. 03/16/21   Delsa Grana, PA-C  Probiotic Product (PROBIOTIC DAILY PO) Take by mouth.    [provider]  thiamine 100 MG tablet Take 100 mg by mouth daily.    [provider]  Turmeric 450 MG CAPS Take 500 mg by mouth daily.     [provider]  ursodiol (ACTIGALL) 300 MG capsule Take 1 capsule (300 mg total) by mouth daily. 02/10/21   Lucilla Lame, MD  vitamin B-12 (CYANOCOBALAMIN) 250 MCG tablet Take 250 mcg by mouth daily.    [provider]  zinc gluconate 50 MG tablet Take 50 mg by mouth daily.    [provider]    Allergies Indomethacin, Ace inhibitors, Augmentin [amoxicillin-pot clavulanate], Elemental sulfur, Levofloxacin in d5w, Levothyroxine sodium, Penicillins, and Sulfa antibiotics  Family History  Problem Relation Age of Onset   Emphysema Father    Asthma Father    Hypertension Daughter    Heart disease Daughter         3 stents   Diabetes Daughter    Hypertension Son    Cancer Son        prostate   Diabetes Son    Heart disease Daughter        heart attack, 2 stents   Multiple sclerosis Daughter    Diabetes Daughter    Fibromyalgia Daughter    Diabetes Daughter     Social History Social History   Tobacco Use   Smoking status: Never   Smokeless tobacco: Never   Tobacco comments:    smoking cessation materials not required  Vaping Use   Vaping Use: Never used  Substance Use Topics   Alcohol use: No  Drug use: No    Review of Systems  Review of Systems  Constitutional:  Negative for chills and fever.  HENT:  Negative for sore throat.   Eyes:  Negative for pain.  Respiratory:  Negative for cough and stridor.   Cardiovascular:  Negative for chest pain.  Gastrointestinal:  Negative for vomiting.  Genitourinary:  Negative for dysuria.  Musculoskeletal:  Negative for myalgias.  Skin:  Negative for rash.  Neurological:  Positive for speech change and headaches. Negative for seizures and loss of consciousness.  Psychiatric/Behavioral:  Positive for memory loss. Negative for suicidal ideas.   All other systems reviewed and are negative.    ____________________________________________   PHYSICAL EXAM:  VITAL SIGNS: ED Triage Vitals  Enc Vitals Group     BP 03/30/21 1253 (!) 185/101     Pulse Rate 03/30/21 1253 85     Resp 03/30/21 1253 18     Temp 03/30/21 1253 98.2 F (36.8 C)     Temp Source 03/30/21 1253 Oral     SpO2 03/30/21 1253 98 %     Weight 03/30/21 1253 142 lb (64.4 kg)     Height 03/30/21 1253 5\' 4"  (1.626 m)     Head Circumference --      Peak Flow --      Pain Score 03/30/21 1259 0     Pain Loc --      Pain Edu? --      Excl. in [place]? --    Vitals:   03/30/21 1253 03/30/21 1255  BP: (!) 185/101 (!) 182/86  Pulse: 85   Resp: 18   Temp: 98.2 F (36.8 C)   SpO2: 98%    Physical Exam Vitals and nursing note reviewed.  Constitutional:       General: She is not in acute distress.    Appearance: She is well-developed.  HENT:     Head: Normocephalic and atraumatic.     Right Ear: External ear normal.     Left Ear: External ear normal.     Nose: Nose normal.     Mouth/Throat:     Mouth: Mucous membranes are moist.  Eyes:     Conjunctiva/sclera: Conjunctivae normal.  Cardiovascular:     Rate and Rhythm: Normal rate and regular rhythm.     Heart sounds: No murmur heard. Pulmonary:     Effort: Pulmonary effort is normal. No respiratory distress.     Breath sounds: Normal breath sounds.  Abdominal:     Palpations: Abdomen is soft.     Tenderness: There is no abdominal tenderness.  Musculoskeletal:     Cervical back: Neck supple.  Skin:    General: Skin is warm and dry.     Capillary Refill: Capillary refill takes less than 2 seconds.  Neurological:     Mental Status: She is alert and oriented to person, place, and time.    Cranial nerves II through XII grossly intact.  No pronator drift.  No finger dysmetria.  Symmetric 5/5 strength of all extremities.  Sensation intact to light touch in all extremities.   ____________________________________________   LABS (all labs ordered are listed, but only abnormal results are displayed)  Labs Reviewed  COMPREHENSIVE METABOLIC PANEL - Abnormal; Notable for the following components:      Result Value   Sodium 126 (*)    Chloride 95 (*)    Glucose, Bld 117 (*)    Creatinine, Ser 1.15 (*)    Alkaline Phosphatase 129 (*)  GFR, Estimated 46 (*)    All other components within normal limits  RESP PANEL BY RT-PCR (FLU A&B, COVID) ARPGX2  CBC  URINALYSIS, COMPLETE (UACMP) WITH MICROSCOPIC   ____________________________________________  EKG  Sinus rhythm with a ventricular of 78, normal axis, normal intervals without evidence of acute ischemia or significant arrhythmia. ____________________________________________  RADIOLOGY  ED MD interpretation: Isodense left-sided  subdural likely improving from last image on 6/18 with no significant midline shift or new hemorrhage.  No other clear acute process.  Official radiology report(s): CT Head Wo Contrast  Result Date: 03/30/2021 CLINICAL DATA:  Headache. Evaluate for intracranial hemorrhage. Fall 2 weeks ago and history of subdural hematoma. EXAM: CT HEAD WITHOUT CONTRAST TECHNIQUE: Contiguous axial images were obtained from the base of the skull through the vertex without intravenous contrast. COMPARISON:  CT face 08/28/2008 and CT report from [place] dated 03/13/2021 FINDINGS: Brain: Subtle effacement of the sulci along the left temporal lobe and left parietal lobe region. Evidence for a small isodense subdural collection along the left convexity. This subdural collection measures roughly 3-4 mm. No evidence for midline shift. No hydrocephalus. No large area of infarct. Small amount of low-density in the periventricular white matter is suggestive for chronic changes. Vascular: No hyperdense vessel or unexpected calcification. Skull: Normal. Negative for fracture or focal lesion. Sinuses/Orbits: Again noted is a small osteoma at the junction of the left frontal sinus and ethmoid air cells. Other: None IMPRESSION: Small isodense left-sided subdural collection. This corresponds with the known subdural hematoma from the previous report on 03/13/2021. Based on the previous report, this subdural collection continues to decrease in size. No significant midline shift. These results were called by telephone at the time of interpretation on 03/30/2021 at 1:53 pm to provider [facility] , who verbally acknowledged these results. Electronically Signed   By: Markus Daft M.D.   On: 03/30/2021 13:53    ____________________________________________   PROCEDURES  Procedure(s) performed (including Critical Care):  .1-3 Lead EKG Interpretation  Date/Time: 03/30/2021 2:00 PM Performed by: Lucrezia Starch,  MD Authorized by: Lucrezia Starch, MD     Interpretation: normal     ECG rate assessment: normal     Rhythm: sinus rhythm     Ectopy: none     Conduction: normal     ____________________________________________   INITIAL IMPRESSION / ASSESSMENT AND PLAN / ED COURSE      Patient presents with above-stated history exam consistent with brief resolved altered mental status last night around 6 PM consisting of difficulty with speech and memory witnessed by daughter.  On arrival patient is hypertensive with BP of 182/86 with stable vital signs on room air.  He has a nonfocal exam and is oriented without evidence of any persistent or acute focal deficits.  Differential includes TIA, new or worsening of subdural, metabolic derangements, UTI, possible absence seizure's patient is on Keppra for seizure prophylaxis.  No new deficits to suggest CVA.  CT Noncon shows no evidence of intracranial hemorrhage and improving and bleed without any new midline shift or other clear acute process.  No evidence on exam of deep space infection of the head or neck.  Low suspicion for toxic ingestion and patient denies any interim trauma since when she fell.  CBC is unremarkable.  CMP shows sodium of 126 compared to 139 1 month ago without any other significant vital metabolic derangements.  Patient states she often has very low sodiums in this range unclear if  this is incidental or contributing to episode last night although I suspect likely not as patient is back to baseline.  Will check UA to rule out UTI although at this point I am most concerned about likely a TIA.  We will obtain CTA head and neck and or MRI and plan to admit to medicine service for further evaluation management.       ____________________________________________   FINAL CLINICAL IMPRESSION(S) / ED DIAGNOSES  Final diagnoses:  Altered mental status, unspecified altered mental status type  Hyponatremia    Medications  lactated  ringers bolus 500 mL (has no administration in time range)  amLODipine (NORVASC) tablet 2.5 mg (has no administration in time range)     ED Discharge Orders     None        Note:  This document was prepared using Dragon voice recognition software and may include unintentional dictation errors.    Lucrezia Starch, MD 03/30/21 1401    Lucrezia Starch, MD 03/30/21 [phone]

## 2021-03-31 ENCOUNTER — Encounter: Payer: PPO | Admitting: Physical Therapy

## 2021-03-31 ENCOUNTER — Ambulatory Visit (INDEPENDENT_AMBULATORY_CARE_PROVIDER_SITE_OTHER): Payer: PPO

## 2021-03-31 ENCOUNTER — Telehealth: Payer: PPO

## 2021-03-31 DIAGNOSIS — S065X9A Traumatic subdural hemorrhage with loss of consciousness of unspecified duration, initial encounter: Secondary | ICD-10-CM | POA: Diagnosis not present

## 2021-03-31 DIAGNOSIS — R2990 Unspecified symptoms and signs involving the nervous system: Secondary | ICD-10-CM

## 2021-03-31 DIAGNOSIS — N1832 Chronic kidney disease, stage 3b: Secondary | ICD-10-CM | POA: Diagnosis not present

## 2021-03-31 DIAGNOSIS — G459 Transient cerebral ischemic attack, unspecified: Secondary | ICD-10-CM

## 2021-03-31 DIAGNOSIS — E782 Mixed hyperlipidemia: Secondary | ICD-10-CM

## 2021-03-31 DIAGNOSIS — I10 Essential (primary) hypertension: Secondary | ICD-10-CM | POA: Diagnosis not present

## 2021-03-31 DIAGNOSIS — E440 Moderate protein-calorie malnutrition: Secondary | ICD-10-CM | POA: Insufficient documentation

## 2021-03-31 LAB — COMPREHENSIVE METABOLIC PANEL
ALT: 10 U/L (ref 0–44)
AST: 23 U/L (ref 15–41)
Albumin: 3.8 g/dL (ref 3.5–5.0)
Alkaline Phosphatase: 93 U/L (ref 38–126)
Anion gap: 7 (ref 5–15)
BUN: 15 mg/dL (ref 8–23)
CO2: 25 mmol/L (ref 22–32)
Calcium: 9.8 mg/dL (ref 8.9–10.3)
Chloride: 100 mmol/L (ref 98–111)
Creatinine, Ser: 0.95 mg/dL (ref 0.44–1.00)
GFR, Estimated: 58 mL/min — ABNORMAL LOW (ref >60)
Glucose, Bld: 77 mg/dL (ref 70–99)
Potassium: 4 mmol/L (ref 3.5–5.1)
Sodium: 132 mmol/L — ABNORMAL LOW (ref 135–145)
Total Bilirubin: 0.9 mg/dL (ref 0.3–1.2)
Total Protein: 7 g/dL (ref 6.5–8.1)

## 2021-03-31 LAB — CBC
HCT: 35.2 % — ABNORMAL LOW (ref 36.0–46.0)
Hemoglobin: 11.9 g/dL — ABNORMAL LOW (ref 12.0–15.0)
MCH: 30.4 pg (ref 26.0–34.0)
MCHC: 33.8 g/dL (ref 30.0–36.0)
MCV: 90 fL (ref 80.0–100.0)
Platelets: 246 10*3/uL (ref 150–400)
RBC: 3.91 MIL/uL (ref 3.87–5.11)
RDW: 13.2 % (ref 11.5–15.5)
WBC: 8.3 10*3/uL (ref 4.0–10.5)
nRBC: 0 % (ref 0.0–0.2)

## 2021-03-31 LAB — LIPID PANEL
Cholesterol: 161 mg/dL (ref 0–200)
HDL: 72 mg/dL (ref >40)
LDL Cholesterol: 73 mg/dL (ref 0–99)
Total CHOL/HDL Ratio: 2.2 RATIO
Triglycerides: 79 mg/dL (ref <150)
VLDL: 16 mg/dL (ref 0–40)

## 2021-03-31 LAB — HEMOGLOBIN A1C
Hgb A1c MFr Bld: 5.4 % (ref 4.8–5.6)
Mean Plasma Glucose: 108.28 mg/dL

## 2021-03-31 LAB — SEDIMENTATION RATE: Sed Rate: 19 mm/hr (ref 0–30)

## 2021-03-31 LAB — C-REACTIVE PROTEIN: CRP: 0.7 mg/dL (ref <1.0)

## 2021-03-31 LAB — VITAMIN B12: Vitamin B-12: 1155 pg/mL — ABNORMAL HIGH (ref 180–914)

## 2021-03-31 MED ORDER — HYDRALAZINE HCL 20 MG/ML IJ SOLN
10.0000 mg | Freq: Four times a day (QID) | INTRAMUSCULAR | Status: DC | PRN
Start: 2021-03-31 — End: 2021-04-01

## 2021-03-31 NOTE — Evaluation (Signed)
Occupational Therapy Evaluation Patient Details Name: Caitlin Jenkins MRN: [id number] DOB: 11-05-1931 Today's Date: 03/31/2021    History of Present Illness Pt is an 87 y/o female admitted with stroke-like symptoms including difficulty speaking and reading. PMH includes SDH 2/2 fall on 03/12/21, HTN, gout, HLD, atherosclerosis, cirrhosis and CKD 3.   Clinical Impression   Caitlin Jenkins was seen for OT evaluation this date. Prior to hospital admission, pt was Independent with mobility and ADLs. Pt lives alone however since fall ~2 weeks ago has had 24/7 assist from 3 daughters who state will continue to provide as needed. Pt requires SUPERVISION for toileting at standard commonde and hand washing / tooth brushing standing sink side. No LOBs reaching outisde BOS or single leg stance to open foot pump trash can. Pt demonstrates near baseline independence to perform ADL and mobility tasks and no strength, sensory, coordination, cognitive, or visual deficits appreciated with assessment. No skilled acute OT needs identified. Will sign off. Please re-consult if additional OT needs arise.      Follow Up Recommendations  No OT follow up;Supervision - Intermittent    Equipment Recommendations  None recommended by OT    Recommendations for Other Services       Precautions / Restrictions Precautions Precautions: Fall Precaution Comments: high fall risk Restrictions Weight Bearing Restrictions: No      Mobility Bed Mobility               General bed mobility comments: pt received and left in chair    Transfers Overall transfer level: Needs assistance Equipment used: Rolling walker (2 wheeled);4-wheeled walker Transfers: Sit to/from Stand Sit to Stand: Supervision         General transfer comment: with and without RW    Balance Overall balance assessment: No apparent balance deficits (not formally assessed)   Sitting balance-Leahy Scale: Good Sitting balance - Comments: Pt  sat at edge of recliner, no back support.   Standing balance support: No upper extremity supported Standing balance-Leahy Scale: Fair Standing balance comment: Challenge was not provided - pt stood w/o AD or external suuport for 2 minutes as PT set up recliner.                           ADL either performed or assessed with clinical judgement   ADL Overall ADL's : Needs assistance/impaired                                       General ADL Comments: SUPERVISION for toileting at standard commonde and hand washing / tooth brushing standing sink side. No LOBs reaching outisde BOS or single leg stance to open foot pump trash can.      Pertinent Vitals/Pain Pain Assessment: No/denies pain     Hand Dominance Right   Extremity/Trunk Assessment Upper Extremity Assessment Upper Extremity Assessment: Overall WFL for tasks assessed   Lower Extremity Assessment Lower Extremity Assessment: Overall WFL for tasks assessed       Communication Communication Communication: HOH   Cognition Arousal/Alertness: Awake/alert Behavior During Therapy: WFL for tasks assessed/performed Overall Cognitive Status: Within Functional Limits for tasks assessed                                 General Comments: A&O x 4; pleasant and agreeable to  therapy   General Comments       Exercises Exercises: Other exercises Other Exercises Other Exercises: Pt and family educated re: OT role, DME recs, d/c recs, falls prevention, home/routines modifications Other Exercises: LBD, toileting, grooming, sit<>stand, sitting/standing balance/tolerance, ~30 ft moblity, funcitonal reach   Shoulder Instructions      Home Living Family/patient expects to be discharged to:: Private residence Living Arrangements: Children (Was living alone prior to fall. Since her fall 2 weeks ago, one of her daughters has been living with her and present 24/7.) Available Help at Discharge:  Family;Available 24 hours/day Type of Home: House Home Access: Stairs to enter [employer] of Steps: 3 Entrance Stairs-Rails: Right Home Layout: Two level;Bed/bath upstairs               Home Equipment: Walker - 2 wheels;Cane - single point   Additional Comments: Used the RW for 3 days post-discharge from last admission. For the past week has not been using RW.      Prior Functioning/Environment Level of Independence: Needs assistance        Comments: Pt independent with most physical mobility; does not use an AD at baseline. Daughters assist with home tasks, driving and are now present for patient safety.        OT Problem List: Decreased activity tolerance;Decreased safety awareness         OT Goals(Current goals can be found in the care plan section) Acute Rehab OT Goals Patient Stated Goal: to go home OT Goal Formulation: With patient/family Time For Goal Achievement: 04/14/21 Potential to Achieve Goals: Good   AM-PAC OT "6 Clicks" Daily Activity     Outcome Measure Help from another person eating meals?: None Help from another person taking care of personal grooming?: None Help from another person toileting, which includes using toliet, bedpan, or urinal?: A Little Help from another person bathing (including washing, rinsing, drying)?: A Little Help from another person to put on and taking off regular upper body clothing?: None Help from another person to put on and taking off regular lower body clothing?: None 6 Click Score: 22   End of Session    Activity Tolerance: Patient tolerated treatment well Patient left: in chair;with call bell/phone within reach;with family/visitor present  OT Visit Diagnosis: Other abnormalities of gait and mobility (R26.89)                Time: 9892-1194 OT Time Calculation (min): 15 min Charges:  OT General Charges $OT Visit: 1 Visit OT Evaluation $OT Eval Low Complexity: 1 Low OT Treatments $Self Care/Home  Management : 8-22 mins  Dessie Coma, M.S. OTR/L  03/31/21, 2:32 PM  ascom [phone]

## 2021-03-31 NOTE — Progress Notes (Signed)
SLP Cancellation Note  Patient Details Name: Caitlin Jenkins MRN: [id number] DOB: 1932/06/08   Cancelled treatment:       Reason Eval/Treat Not Completed: SLP screened, no needs identified, will sign off (chart reviewed; consulted NSG then met w/ pt/Dtr in room). Pt denied any difficulty swallowing and is currently on a regular diet; tolerates swallowing pills w/ water per NSG. Pt talkative and conversed at conversational level w/out overt deficits noted; pt and Daughter denied any current, overt speech-language deficits also. Pt had just completed PT evaluation prior to this screening w/ no reported speech-language deficits. Pt is HOH w/ HA. During screening, pt described that her memory for friends' names "returned" after ~30 mins of not being able to recall their names, and that playing [employer] puzzles took "longer" on the day of her event leading to her hospitalization. Pt had a recent trauma of subdural hematoma(now subacute) after a fall on 03/12/2021 w/ resulting "Trace scattered subacute subarachnoid hemorrhage overlying the left cerebral hemisphere; Mild chronic small vessel ischemic changes and parenchymal atrophy". As pt is communicating appropriately w/ others in conversation and following instructions per PT evaluation, no further skilled ST services indicated at this time as pt appears close to/at her baseline again. Recommend f/u w/ PCP post returning home for a referral for services if any deficits are noted during her usual communication in ADLs. Pt and Daughter agreed. NSG to reconsult if any change in status while admitted.      Orinda Kenner, MS, CCC-SLP Speech Language Pathologist Rehab Services [phone] [facility] 03/31/2021, 11:28 AM

## 2021-03-31 NOTE — Progress Notes (Signed)
Initial Nutrition Assessment  DOCUMENTATION CODES:  Non-severe (moderate) malnutrition in context of acute illness/injury  INTERVENTION:  Recommend liberalizing diet to regular.  Add Vital Cuisine Shake BID, each supplement provides 520 kcal and 22 grams of protein.  Add Magic cup TID with meals, each supplement provides 290 kcal and 9 grams of protein.  NUTRITION DIAGNOSIS:  Moderate Malnutrition related to acute illness as evidenced by energy intake < 75% for > 7 days, mild fat depletion, mild muscle depletion.  GOAL:  Patient will meet greater than or equal to 90% of their needs  MONITOR:  PO intake, Supplement acceptance, Diet advancement, Labs, Weight trends, I & O's  REASON FOR ASSESSMENT:  Malnutrition Screening Tool    ASSESSMENT:  87 yo female with a PMH of HTN, HLD, hypothyroidism, CKD stage 3a, history of skin cancer, and history of subdural hematoma 2/2 trauma who presents with stroke-like symptoms after realizing she was having difficulty breathing and speaking.  Spoke with pt and daughter. Both in very pleasant spirits. Pt reports eating less the past few months with the hernia pressing against her stomach, causing early satiety and decreased appetite. She reports eating well prior to this.  Daughter reports 15-20 lb weight loss over the past year, and more so recently. Per Epic, pt has lost ~6 lbs (4.1%) in the last month, which is not necessarily significant for the time frame, but is still concerning. Pt has been gradually losing weight over the past year.  On exam, pt with some mild depletions.  Given above information, pt with acute moderate malnutrition.  Recommend adding Hormel shakes BID and Magic Cup TID to promote intake. Pt does not like Ensure or Boost products. Also recommend liberalizing diet to regular to promote choice and given pt's age.  Medications: reviewed; Vitamin C BID, Synthroid, Protonix, thiamine, Vitamin B12  Labs: reviewed; Na 132  (L)  NUTRITION - FOCUSED PHYSICAL EXAM: Flowsheet Row Most Recent Value  Orbital Region Mild depletion  Upper Arm Region Mild depletion  Thoracic and Lumbar Region No depletion  Buccal Region Mild depletion  Temple Region Mild depletion  Clavicle Bone Region No depletion  Clavicle and Acromion Bone Region No depletion  Scapular Bone Region No depletion  Dorsal Hand Mild depletion  Patellar Region No depletion  Anterior Thigh Region No depletion  Posterior Calf Region No depletion  Edema (RD Assessment) None  Hair Reviewed  Eyes Reviewed  Mouth Reviewed  Skin Reviewed  Nails Reviewed   Diet Order:   Diet Order             Diet Heart Room service appropriate? Yes; Fluid consistency: Thin  Diet effective now                  EDUCATION NEEDS:  Education needs have been addressed  Skin:  Skin Assessment: Reviewed RN Assessment  Last BM:  PTA/unknown  Height:  Ht Readings from Last 1 Encounters:  03/30/21 5\' 4"  (1.626 m)   Weight:  Wt Readings from Last 1 Encounters:  03/30/21 64.4 kg   Ideal Body Weight:  54.5 kg  BMI:  Body mass index is 24.37 kg/m.  Estimated Nutritional Needs:  Kcal:  1500-1700 Protein:  75-90 grams Fluid:  >1.5 L  Derrel Nip, RD, LDN (she/her/hers) Registered Dietitian I After-Hours/Weekend Pager # in [place]

## 2021-03-31 NOTE — Evaluation (Signed)
Physical Therapy Evaluation Patient Details Name: Caitlin Jenkins MRN: [id number] DOB: 01-28-1932 Today's Date: 03/31/2021   History of Present Illness  Pt is an 87 y/o female admitted with stroke-like symptoms including difficulty speaking and reading. PMH includes SDH 2/2 fall on 03/12/21, HTN, gout, HLD, atherosclerosis, cirrhosis and CKD 3.  Clinical Impression  Pt is a pleasant 87 y/o female admitted with stroke-like symptoms. She was received sitting in the recliner with two of her daughters in the room. Bed mobility was not observed. Transfers, ambulation and standing balance were performed with SUP. Pt did use RW to ambulate per pt preference and comfort. PT educated pt to continue using RW upon d/c until comfortable without it. She demonstrates limitations in dynamic standing balance and mild gait deviations. She reports difficulty performing stairs to enter home. Pt will benefit from continued PT services to increase independence and safety after discharge.      Follow Up Recommendations Home health PT    Equipment Recommendations  None recommended by PT    Recommendations for Other Services       Precautions / Restrictions Precautions Precautions: Fall Precaution Comments: high fall risk      Mobility  Bed Mobility               General bed mobility comments: Bed mobility not assessed; pt recieved in recliner    Transfers Overall transfer level: Needs assistance   Transfers: Sit to/from Stand Sit to Stand: Supervision         General transfer comment: performed without AD  Ambulation/Gait Ambulation/Gait assistance: Supervision Gait Distance (Feet): 250 Feet Assistive device: Rolling walker (2 wheeled) Gait Pattern/deviations: Step-through pattern;WFL(Within Functional Limits);Narrow base of support;Decreased stride length Gait velocity: decreased gait velocity   General Gait Details: Mild gait deficits. Pt felt safer ambulating with RW; SUP for  safety. No VC required for use of AD.  Stairs            Wheelchair Mobility    Modified Rankin (Stroke Patients Only)       Balance Overall balance assessment: No apparent balance deficits (not formally assessed)   Sitting balance-Leahy Scale: Good Sitting balance - Comments: Pt sat at edge of recliner, no back support.   Standing balance support: No upper extremity supported Standing balance-Leahy Scale: Fair Standing balance comment: Challenge was not provided - pt stood w/o AD or external suuport for 2 minutes as PT set up recliner.                             Pertinent Vitals/Pain Pain Assessment: No/denies pain    Home Living Family/patient expects to be discharged to:: Private residence Living Arrangements: Children (Was living alone prior to fall. Since her fall 2 weeks ago, one of her daughters has been living with her and present 24/7.) Available Help at Discharge: Family;Available 24 hours/day Type of Home: House Home Access: Stairs to enter Entrance Stairs-Rails: Right Entrance Stairs-Number of Steps: 3 Home Layout: Two level;Bed/bath upstairs (has a seated stair lift to assist with navigating indoor stairs) Home Equipment: Walker - 2 wheels;Cane - single point Additional Comments: Used the RW for 3 days post-discharge from last admission. For the past week has not been using RW.    Prior Function Level of Independence: Needs assistance         Comments: Pt independent with most physical mobility; does not use an AD at baseline. Daughters assist with home tasks,  driving and are now present for patient safety.     Hand Dominance   Dominant Hand: Right    Extremity/Trunk Assessment   Upper Extremity Assessment Upper Extremity Assessment: Overall WFL for tasks assessed    Lower Extremity Assessment Lower Extremity Assessment: Overall WFL for tasks assessed (4/5 L hip flexion, 4+/5 R hip flexion; 5/5 remaining BLE MMT)        Communication   Communication: HOH  Cognition Arousal/Alertness: Awake/alert Behavior During Therapy: WFL for tasks assessed/performed Overall Cognitive Status: Within Functional Limits for tasks assessed                                 General Comments: A&O x 4; pleasant and agreeable to therapy      General Comments      Exercises     Assessment/Plan    PT Assessment Patient needs continued PT services  PT Problem List Decreased mobility;Decreased balance;Decreased activity tolerance       PT Treatment Interventions Therapeutic activities;DME instruction;Gait training;Therapeutic exercise;Patient/family education;Stair training;Balance training;Functional mobility training;Neuromuscular re-education    PT Goals (Current goals can be found in the Care Plan section)  Acute Rehab PT Goals Patient Stated Goal: to go home PT Goal Formulation: With patient/family Time For Goal Achievement: 04/14/21 Potential to Achieve Goals: Good    Frequency Min 2X/week   Barriers to discharge        Co-evaluation               AM-PAC PT "6 Clicks" Mobility  Outcome Measure Help needed turning from your back to your side while in a flat bed without using bedrails?: None Help needed moving from lying on your back to sitting on the side of a flat bed without using bedrails?: None Help needed moving to and from a bed to a chair (including a wheelchair)?: A Little Help needed standing up from a chair using your arms (e.g., wheelchair or bedside chair)?: A Little Help needed to walk in hospital room?: A Little Help needed climbing 3-5 steps with a railing? : A Lot 6 Click Score: 19    End of Session   Activity Tolerance: Patient tolerated treatment well Patient left: in chair;with call bell/phone within reach Nurse Communication: Mobility status;Other (comment) (notified RN of pt left in chair w/o chair alarm; daughter to return in 10 minutes) PT Visit Diagnosis:  Unsteadiness on feet (R26.81);History of falling (Z91.81)    Time: 1030-1051 PT Time Calculation (min) (ACUTE ONLY): 21 min   Charges:   PT Evaluation $PT Eval Low Complexity: 1 Low         Patrina Levering PT, DPT 03/31/21 12:35 PM [phone]

## 2021-03-31 NOTE — Progress Notes (Addendum)
SLP Cancellation Note  Patient Details Name: Caitlin Jenkins MRN: [id number] DOB: 04/22/1932   Cancelled treatment:       Reason Eval/Treat Not Completed: SLP screened, no needs identified, will sign off (chart reviewed; consulted NSG then met w/ pt/Dtr in room). Pt denied any difficulty swallowing and is currently on a regular diet; tolerates swallowing pills w/ water per NSG. Pt conversed at conversational level w/out overt deficits noted; pt and Daughter denied any current speech-language deficits at this time. Pt had a recent Fall resulting in a subdural hematoma(now subacute) on 03/12/2021. Per MRI, "subacute subdural hematoma overlying left cerebral hemisphere w/ Trace scattered subacute subarachnoid hemorrhage; Mild chronic small vessel ischemic changes and parenchymal atrophy". Pt stated she could not recall friends' names for ~30 mins and had difficulty completing a Sudoku puzzle on the day of the event; this has resolved per pt report. Briefly discussed healing process post any traumatic event. Pt conversed in appropriate conversation and command following per PT report at evaluation this morning. Pt is HOH w/ HA+ baseline.  No immediate skilled ST services indicated as pt appears close to/at her baseline. Encouraged pt/Dtr to f/u w/ PCP if any communication deficits are noted in pt's general ADLs post discharge home. Pt and Daughter agreed. NSG to reconsult if any change in status while admitted.       Orinda Kenner, MS, CCC-SLP Speech Language Pathologist Rehab Services [phone] [facility] 03/31/2021, 11:42 AM

## 2021-03-31 NOTE — Progress Notes (Signed)
Eeg done 

## 2021-03-31 NOTE — Consult Note (Signed)
Referring Physician:  No referring provider defined for this encounter.  Primary Physician:  Delsa Grana, PA-C  Chief Complaint:  concern for subdural empyema  History of Present Illness: 03/31/2021 Caitlin Jenkins is a 87 y.o. female (RH) who presents with the chief complaint of fall 2 weeks ago.  She was admitted to [facility] and was observed for 3 days with a L acute subdural hematoma.  She has a mild headache, but it has been improving.  She had an event on Monday where she had trouble speaking for approximately 1 hour.  She is now doing better, but still having some trouble with typing.  She has no other complaints.   She has no fevers, chills, or vomiting.    Review of Systems:  A 10 point review of systems is negative, except for the pertinent positives and negatives detailed in the HPI.  Past Medical History: Past Medical History:  Diagnosis Date   Biliary cirrhosis ([place])    Cancer of skin of leg    Cellulitis    CKD (chronic kidney disease) stage 3, GFR 30-59 ml/min (HCC)    Gout    Hiatal hernia Oct 2015   8.5cm   Hiatal hernia 06/26/2014   8.5cm    History of basal cell carcinoma (BCC) 09/12/2018   Oct 2016; Moh's surgery; right antitragus   History of diverticulitis 2000   Hyperlipidemia    Hyperparathyroidism, secondary renal ([place]) 07/11/2018   Managed by nephrologist   Hypertension    Hypomagnesemia 07/03/2019   Hypothyroidism    Skin cancer of nose     Past Surgical History: Past Surgical History:  Procedure Laterality Date   ABDOMINAL HYSTERECTOMY  1995   CATARACT EXTRACTION     FOOT SURGERY  2000   HEMORRHOID SURGERY     THYROID SURGERY  1999   para thyroid    Allergies: Allergies as of 03/30/2021 - Review Complete 03/30/2021  Allergen Reaction Noted   Indomethacin Hives 10/14/2014   Levofloxacin in d5w Other (See Comments) 02/18/2021   Ace inhibitors Swelling 09/25/2014   Augmentin [amoxicillin-pot clavulanate] Other (See  Comments) 03/12/2015   Elemental sulfur Other (See Comments) 03/13/2015   Penicillins Nausea And Vomiting 03/12/2015   Sulfa antibiotics Swelling and Other (See Comments) 07/03/2019    Medications:  Current Facility-Administered Medications:    acetaminophen (TYLENOL) tablet 650 mg, 650 mg, Oral, Q4H PRN **OR** acetaminophen (TYLENOL) 160 MG/5ML solution 650 mg, 650 mg, Per Tube, Q4H PRN **OR** acetaminophen (TYLENOL) suppository 650 mg, 650 mg, Rectal, Q4H PRN, Cox, Amy N, DO   amLODipine (NORVASC) tablet 2.5 mg, 2.5 mg, Oral, Daily, Cox, Amy N, DO, 2.5 mg at 03/31/21 1735   ascorbic acid (VITAMIN C) tablet 1,000 mg, 1,000 mg, Oral, BID, Cox, Amy N, DO, 1,000 mg at 03/31/21 0919   atorvastatin (LIPITOR) tablet 10 mg, 10 mg, Oral, QHS, Cox, Amy N, DO, 10 mg at 03/30/21 2209   colchicine tablet 0.6 mg, 0.6 mg, Oral, Daily PRN, Cox, Amy N, DO   diphenhydrAMINE (BENADRYL) capsule 25 mg, 25 mg, Oral, QHS PRN, Cox, Amy N, DO, 25 mg at 03/30/21 2209   hydrALAZINE (APRESOLINE) injection 10 mg, 10 mg, Intravenous, Q6H PRN, Cox, Amy N, DO   levothyroxine (SYNTHROID) tablet 75 mcg, 75 mcg, Oral, Q0600, Cox, Amy N, DO, 75 mcg at 03/31/21 0504   losartan (COZAAR) tablet 100 mg, 100 mg, Oral, Daily, Cox, Amy N, DO, 100 mg at 03/31/21 0919   meclizine (ANTIVERT)  tablet 12.5-25 mg, 12.5-25 mg, Oral, TID PRN, Cox, Amy N, DO   pantoprazole (PROTONIX) EC tablet 40 mg, 40 mg, Oral, QHS, Cox, Amy N, DO   thiamine tablet 100 mg, 100 mg, Oral, Daily, Cox, Amy N, DO, 100 mg at 03/31/21 0920   ursodiol (ACTIGALL) capsule 300 mg, 300 mg, Oral, Daily, Cox, Amy N, DO, 300 mg at 03/31/21 0919   vitamin B-12 (CYANOCOBALAMIN) tablet 250 mcg, 250 mcg, Oral, Daily, Cox, Amy N, DO, 250 mcg at 03/31/21 0920   Social History: Social History   Tobacco Use   Smoking status: Never   Smokeless tobacco: Never   Tobacco comments:    smoking cessation materials not required  Vaping Use   Vaping Use: Never used  Substance  Use Topics   Alcohol use: No   Drug use: No    Family Medical History: Family History  Problem Relation Age of Onset   Emphysema Father    Asthma Father    Hypertension Daughter    Heart disease Daughter        3 stents   Diabetes Daughter    Hypertension Son    Cancer Son        prostate   Diabetes Son    Heart disease Daughter        heart attack, 2 stents   Multiple sclerosis Daughter    Diabetes Daughter    Fibromyalgia Daughter    Diabetes Daughter     Physical Examination: Vitals:   03/31/21 1238 03/31/21 1601  BP: (!) 150/97 (!) 145/77  Pulse: 77 85  Resp: 18 16  Temp: 97.7 F (36.5 C) 97.6 F (36.4 C)  SpO2: 100% 98%     General: Patient is well developed, well nourished, calm, collected, and in no apparent distress.  Psychiatric: Patient is non-anxious.  Head:  Pupils equal, round, and reactive to light.  ENT:  Oral mucosa appears well hydrated.  Neck:   Supple.  Full range of motion.  Respiratory: Patient is breathing without any difficulty.  Extremities: No edema.  Vascular: Palpable pulses in dorsal pedal vessels.  Skin:   On exposed skin, there are no abnormal skin lesions.  NEUROLOGICAL:  General: In no acute distress.   Awake, alert, oriented to person, place, and time.  Pupils equal round and reactive to light.  Facial tone is symmetric.  Tongue protrusion is midline.  There is no pronator drift.  Speech is clear and fluent.  She names 3/3 and repeats.   Strength: Side Biceps Triceps Deltoid Interossei Grip Wrist Ext. Wrist Flex.  R 5 5 5 5 5 5 5   L 5 5 5 5 5 5 5    Side Iliopsoas Quads Hamstring PF DF EHL  R 5 5 5 5 5 5   L 5 5 5 5 5 5     Bilateral upper and lower extremity sensation is intact to light touch. Reflexes are 1+ and symmetric at the biceps, triceps, brachioradialis, patella and achilles. Hoffman's is absent.  Gait is untested. Imaging: MRIA Head and Neck 03/30/21 IMPRESSION: MRI brain:   1. Redemonstrated  subacute subdural hematoma overlying left cerebral hemisphere, measuring up to 10 mm in thickness. There is apparent restricted diffusion within the collection, which may reflect susceptibility artifact from blood products at this site. However, correlate clinically to exclude any signs or symptoms that would suggest superimposed infection/empyema. 2. Trace subacute subdural hemorrhage also present along the falx and overlying the posterior right frontal lobe. 3. Trace scattered  subacute subarachnoid hemorrhage overlying the left cerebral hemisphere. 4. Mild chronic small vessel ischemic changes within the cerebral white matter and pons. 5. Mild generalized parenchymal atrophy.   MRA head:   1. Intracranial atherosclerotic disease, as described. Motion artifact limits evaluation of the anterior cerebral arteries at the A2/A3 junction. Within this limitation, no intracranial large vessel occlusion or proximal high-grade arterial stenosis is identified. 2. There appear to be 2 anterior communicating arteries. 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 3. 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 4. 2 mm aneurysm arising from the supraclinoid left ICA. 5. 1-2 mm inferiorly projecting vascular protrusion arising from the supraclinoid right ICA, which may reflect an infundibulum or small aneurysm.   MRA neck:   The common carotid, internal carotid and vertebral arteries are patent within the neck without hemodynamically significant stenosis.     Electronically Signed   By: Kellie Simmering DO   On: 03/30/2021 16:51    I have personally reviewed the images and agree with the above interpretation.  Labs: CBC Latest Ref Rng & Units 03/31/2021 03/30/2021 02/18/2021  WBC 4.0 - 10.5 K/uL 8.3 9.2 8.2  Hemoglobin 12.0 - 15.0 g/dL 11.9(L) 13.0 11.9  Hematocrit 36.0 - 46.0 % 35.2(L) 38.4 36.3  Platelets 150 - 400 K/uL 246 254 224        Assessment and Plan: Ms. Oxendine is a pleasant 87 y.o. female with left subdural hematoma with some changes in speech on Monday being worked up by neurology and primary team.  She may have had a TIA or could have cortical irritation from her subdural collection.  Given the clinical presentation, radiographic appearance and her normal ESR, I strongly support that she has a subdural hematoma rather than empyema.    At this point, I would recommend observing her subdural hematoma and we will arrange follow up in clinic in 2 weeks.  We will also conservatively manage the aneurysms found on the MRA - these are small and unlikely to have caused her subdural hematoma.     Zian Mohamed K. Izora Ribas MD, [place] [facility] of Neuro[facility]

## 2021-03-31 NOTE — Procedures (Signed)
Routine EEG Report  Caitlin Jenkins is a 87 y.o. female with a history of subacute L subdural hematoma and transient spell expressive aphasia who is undergoing an EEG to evaluate for seizures.  Report: This EEG was acquired with electrodes placed according to the International 10-20 electrode system (including Fp1, Fp2, F3, F4, C3, C4, P3, P4, O1, O2, T3, T4, T5, T6, A1, A2, Fz, Cz, Pz). The following electrodes were missing or displaced: none.  The occipital dominant rhythm was 11 Hz. This activity is reactive to stimulation. Drowsiness was manifested by background fragmentation; deeper stages of sleep were not identified. There was no focal slowing. There were no interictal epileptiform discharges. There were no electrographic seizures identified. There was no abnormal response to photic stimulation or hyperventilation.   Impression: This EEG was obtained while awake and drowsy and is normal.    Clinical Correlation: Normal EEGs, however, do not rule out epilepsy.  Su Monks, MD [facility] [phone]  If 7pm- 7am, please page neurology on call as listed in [place].

## 2021-03-31 NOTE — Progress Notes (Signed)
PROGRESS NOTE    Caitlin Jenkins  KGM:[id number] DOB: Sep 18, 1932 DOA: 03/30/2021 PCP: Delsa Grana, PA-C    Brief Narrative:  Caitlin Jenkins is a 87 y.o. female with medical history significant for primary hypertension, hypothyroid, hyperlipidemia, history of subdural hematoma secondary to trauma, presents to the emergency department for chief concerns of difficulty speaking.  Chart review: Hospitalization at [facility] from 03/12/2021 to 03/14/2021 for subdural hematoma secondary to trauma, patient falling backwards. Patient was discharged home with Keppra 500 mg twice daily to complete treatment on 03/20/2021. Patient was scheduled for repeat imaging in 4 weeks  Consultants:  Neurosurgery , neurology  Procedures:   Antimicrobials:      Subjective: Pt has no HA, double vision, chills, sob.  Objective: Vitals:   03/30/21 2005 03/30/21 2316 03/31/21 0003 03/31/21 0429  BP: (!) 161/98 (!) 162/85 (!) 156/87 (!) 155/88  Pulse: 91 79  82  Resp: 17 17  17   Temp: 98 F (36.7 C) 98.2 F (36.8 C)  98.1 F (36.7 C)  TempSrc: Oral Oral  Oral  SpO2: 98% 98%  99%  Weight:      Height:        Intake/Output Summary (Last 24 hours) at 03/31/2021 0824 Last data filed at 03/31/2021 0300 Gross per 24 hour  Intake 716.51 ml  Output --  Net 716.51 ml   Filed Weights   03/30/21 1253  Weight: 64.4 kg    Examination:  General exam: Appears calm and comfortable  Respiratory system: Clear to auscultation. Respiratory effort normal. Cardiovascular system: S1 & S2 heard, RRR. No JVD, murmurs, rubs, gallops or clicks. No pedal edema. Gastrointestinal system: Abdomen is nondistended, soft and nontender.  Normal bowel sounds heard. Central nervous system: Alert and oriented. Grossly intact.  Extremities: no edema Skin: warm, dry Psychiatry:Mood & affect appropriate.     Data Reviewed: I have personally reviewed following labs and imaging studies  CBC: Recent Labs   Lab 03/30/21 1254 03/31/21 0410  WBC 9.2 8.3  HGB 13.0 11.9*  HCT 38.4 35.2*  MCV 89.3 90.0  PLT 254 644   Basic Metabolic Panel: Recent Labs  Lab 03/30/21 1254 03/31/21 0410  NA 126* 132*  K 4.4 4.0  CL 95* 100  CO2 24 25  GLUCOSE 117* 77  BUN 19 15  CREATININE 1.15* 0.95  CALCIUM 9.9 9.8   GFR: Estimated Creatinine Clearance: 36 mL/min (by C-G formula based on SCr of 0.95 mg/dL). Liver Function Tests: Recent Labs  Lab 03/30/21 1254 03/31/21 0410  AST 27 23  ALT 12 10  ALKPHOS 129* 93  BILITOT 0.9 0.9  PROT 7.7 7.0  ALBUMIN 4.4 3.8   No results for input(s): LIPASE, AMYLASE in the last 168 hours. No results for input(s): AMMONIA in the last 168 hours. Coagulation Profile: No results for input(s): INR, PROTIME in the last 168 hours. Cardiac Enzymes: No results for input(s): CKTOTAL, CKMB, CKMBINDEX, TROPONINI in the last 168 hours. BNP (last 3 results) No results for input(s): PROBNP in the last 8760 hours. HbA1C: No results for input(s): HGBA1C in the last 72 hours. CBG: No results for input(s): GLUCAP in the last 168 hours. Lipid Profile: Recent Labs    03/31/21 0410  CHOL 161  HDL 72  LDLCALC 73  TRIG 79  CHOLHDL 2.2   Thyroid Function Tests: Recent Labs    03/30/21 1254  TSH 3.411  FREET4 1.21*   Anemia Panel: No results for input(s): VITAMINB12, FOLATE, FERRITIN,  TIBC, IRON, RETICCTPCT in the last 72 hours. Sepsis Labs: No results for input(s): PROCALCITON, LATICACIDVEN in the last 168 hours.  Recent Results (from the past 240 hour(s))  Resp Panel by RT-PCR (Flu A&B, Covid) Nasopharyngeal Swab     Status: None   Collection Time: 03/30/21  1:53 PM   Specimen: Nasopharyngeal Swab; Nasopharyngeal(NP) swabs in vial transport medium  Result Value Ref Range Status   SARS Coronavirus 2 by RT PCR NEGATIVE NEGATIVE Final    Comment: (NOTE) SARS-CoV-2 target nucleic acids are NOT DETECTED.  The SARS-CoV-2 RNA is generally detectable in  upper respiratory specimens during the acute phase of infection. The lowest concentration of SARS-CoV-2 viral copies this assay can detect is 138 copies/mL. A negative result does not preclude SARS-Cov-2 infection and should not be used as the sole basis for treatment or other patient management decisions. A negative result may occur with  improper specimen collection/handling, submission of specimen other than nasopharyngeal swab, presence of viral mutation(s) within the areas targeted by this assay, and inadequate number of viral copies(<138 copies/mL). A negative result must be combined with clinical observations, patient history, and epidemiological information. The expected result is Negative.  Fact Sheet for Patients:  EntrepreneurPulse.com.au  Fact Sheet for Healthcare Providers:  IncredibleEmployment.be  This test is no t yet approved or cleared by the [place] FDA and  has been authorized for detection and/or diagnosis of SARS-CoV-2 by FDA under an Emergency Use Authorization (EUA). This EUA will remain  in effect (meaning this test can be used) for the duration of the COVID-19 declaration under Section 564(b)(1) of the Act, 21 U.S.C.section 360bbb-3(b)(1), unless the authorization is terminated  or revoked sooner.       Influenza A by PCR NEGATIVE NEGATIVE Final   Influenza B by PCR NEGATIVE NEGATIVE Final    Comment: (NOTE) The Xpert Xpress SARS-CoV-2/FLU/RSV plus assay is intended as an aid in the diagnosis of influenza from Nasopharyngeal swab specimens and should not be used as a sole basis for treatment. Nasal washings and aspirates are unacceptable for Xpert Xpress SARS-CoV-2/FLU/RSV testing.  Fact Sheet for Patients: EntrepreneurPulse.com.au  Fact Sheet for Healthcare Providers: IncredibleEmployment.be  This test is not yet approved or cleared by the [place] FDA and has been  authorized for detection and/or diagnosis of SARS-CoV-2 by FDA under an Emergency Use Authorization (EUA). This EUA will remain in effect (meaning this test can be used) for the duration of the COVID-19 declaration under Section 564(b)(1) of the Act, 21 U.S.C. section 360bbb-3(b)(1), unless the authorization is terminated or revoked.  Performed at [facility], [address], [place], [place] [address]          Radiology Studies: CT Head Wo Contrast  Result Date: 03/30/2021 CLINICAL DATA:  Headache. Evaluate for intracranial hemorrhage. Fall 2 weeks ago and history of subdural hematoma. EXAM: CT HEAD WITHOUT CONTRAST TECHNIQUE: Contiguous axial images were obtained from the base of the skull through the vertex without intravenous contrast. COMPARISON:  CT face 08/28/2008 and CT report from [place] dated 03/13/2021 FINDINGS: Brain: Subtle effacement of the sulci along the left temporal lobe and left parietal lobe region. Evidence for a small isodense subdural collection along the left convexity. This subdural collection measures roughly 3-4 mm. No evidence for midline shift. No hydrocephalus. No large area of infarct. Small amount of low-density in the periventricular white matter is suggestive for chronic changes. Vascular: No hyperdense vessel or unexpected calcification. Skull: Normal. Negative  for fracture or focal lesion. Sinuses/Orbits: Again noted is a small osteoma at the junction of the left frontal sinus and ethmoid air cells. Other: None IMPRESSION: Small isodense left-sided subdural collection. This corresponds with the known subdural hematoma from the previous report on 03/13/2021. Based on the previous report, this subdural collection continues to decrease in size. No significant midline shift. These results were called by telephone at the time of interpretation on 03/30/2021 at 1:53 pm to provider [facility] , who verbally acknowledged these  results. Electronically Signed   By: Markus Daft M.D.   On: 03/30/2021 13:53   MR ANGIO HEAD WO CONTRAST  Result Date: 03/30/2021 CLINICAL DATA:  Neuro deficit, acute, stroke suspected. Additional history provided: Headache. Evaluate for intracranial hemorrhage. Fall 2 weeks ago with history of subdural hematoma. EXAM: MRI HEAD WITHOUT CONTRAST MRA HEAD WITHOUT CONTRAST MRA NECK WITHOUT AND WITH CONTRAST TECHNIQUE: Multiplanar, multi-echo pulse sequences of the brain and surrounding structures were acquired without intravenous contrast. Angiographic images of the Circle of Willis were acquired using MRA technique without intravenous contrast. Angiographic images of the neck were acquired using MRA technique without and with intravenous contrast. Carotid stenosis measurements (when applicable) are obtained utilizing NASCET criteria, using the distal internal carotid diameter as the denominator. CONTRAST:  27mL GADAVIST GADOBUTROL 1 MMOL/ML IV SOLN COMPARISON:  Head CT performed earlier today 03/30/2021. Brain MRI 12/26/2012. FINDINGS: MRI HEAD FINDINGS Brain: Mild generalized cerebral and cerebellar atrophy. There is a subdural hematoma overlying the left cerebral hemisphere measuring up to 10 mm. The subdural hematoma is characterized by precontrast T1 hyperintensity, T2 FLAIR hyperintensity and heterogeneous SWI signal loss. This is most compatible with a subacute subdural hemorrhage. The collection also demonstrates apparent restricted diffusion (which may reflect susceptibility artifact from blood products at this site). This collection does not appear significantly changed in size as compared to the head CT performed earlier today. As before, there is mild mass effect upon the underlying left cerebral hemisphere without midline shift. There is trace subacute subarachnoid hemorrhage overlying the left cerebral hemisphere (for instance as seen on series 15, image 31). Trace subacute subdural hemorrhage is also  present along the falx and overlying the posterior right frontal lobe (for instance as seen on series 15, image 48) (series 9, image 12). Mild multifocal T2/FLAIR hyperintensity within the cerebral white matter and pons, nonspecific but compatible with chronic small vessel ischemic disease. There is no acute infarct. No evidence of an intracranial mass. Vascular: Expected proximal arterial flow voids. Skull and upper cervical spine: No focal marrow lesion. Sinuses/Orbits: Visualized orbits show no acute finding. Bilateral lens replacements. Trace scattered paranasal sinus mucosal thickening. MRA HEAD FINDINGS Anterior circulation: The intracranial internal carotid arteries are patent. The M1 middle cerebral arteries are patent. Mild/moderate stenosis within the proximal M1 segment of the right middle cerebral artery. Atherosclerotic irregularity of the M2 and more distal middle cerebral artery branches bilaterally. No M2 proximal branch occlusion or high-grade proximal stenosis is identified. The anterior cerebral arteries are patent. Motion artifact limits evaluation of the distal A2 and proximal A3 segments of the anterior cerebral arteries. There appear to be 2 anterior communicating arteries. 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 2 mm ventrally projecting vascular protrusion arising from the cavernous right ICA suspicious for additional aneurysm (series 5, image 77) (series 1062, image 236). 2 mm inferiorly projecting vascular protrusion arising from  the supraclinoid left ICA, with an appearance most suggestive of a small aneurysm (series 1062, image 194) (series 105, image 86). 1-2 mm inferiorly projecting vascular protrusion arising from the supraclinoid right ICA, which may reflect an infundibulum or small aneurysm. Posterior circulation: The intracranial vertebral arteries are patent. The basilar artery is  patent. The posterior cerebral arteries are patent. Posterior communicating arteries are hypoplastic or absent bilaterally. Anatomic variants: As described MRA NECK FINDINGS Aortic arch: Standard aortic branching. The visualized aortic arch is normal in caliber. No hemodynamically significant innominate or proximal subclavian artery stenosis. Right carotid system: The common carotid and internal carotid arteries are patent within the neck without hemodynamically significant stenosis (50% or greater). Left carotid system: The common carotid and internal carotid arteries are patent within the neck without hemodynamically significant stenosis (50% or greater). Vertebral arteries: Vertebral arteries are codominant and patent within the neck with antegrade flow. No hemodynamically significant stenosis within these vessels. MRI brain findings called by telephone at the time of interpretation on 03/30/2021 at 4:50 pm to provider Dr. Maximiano Coss, who verbally acknowledged these results. IMPRESSION: MRI brain: 1. Redemonstrated subacute subdural hematoma overlying left cerebral hemisphere, measuring up to 10 mm in thickness. There is apparent restricted diffusion within the collection, which may reflect susceptibility artifact from blood products at this site. However, correlate clinically to exclude any signs or symptoms that would suggest superimposed infection/empyema. 2. Trace subacute subdural hemorrhage also present along the falx and overlying the posterior right frontal lobe. 3. Trace scattered subacute subarachnoid hemorrhage overlying the left cerebral hemisphere. 4. Mild chronic small vessel ischemic changes within the cerebral white matter and pons. 5. Mild generalized parenchymal atrophy. MRA head: 1. Intracranial atherosclerotic disease, as described. Motion artifact limits evaluation of the anterior cerebral arteries at the A2/A3 junction. Within this limitation, no intracranial large vessel occlusion or proximal  high-grade arterial stenosis is identified. 2. There appear to be 2 anterior communicating arteries. 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 3. 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 4. 2 mm aneurysm arising from the supraclinoid left ICA. 5. 1-2 mm inferiorly projecting vascular protrusion arising from the supraclinoid right ICA, which may reflect an infundibulum or small aneurysm. MRA neck: The common carotid, internal carotid and vertebral arteries are patent within the neck without hemodynamically significant stenosis. Electronically Signed   By: Kellie Simmering DO   On: 03/30/2021 16:51   MR ANGIO NECK W WO CONTRAST  Result Date: 03/30/2021 CLINICAL DATA:  Neuro deficit, acute, stroke suspected. Additional history provided: Headache. Evaluate for intracranial hemorrhage. Fall 2 weeks ago with history of subdural hematoma. EXAM: MRI HEAD WITHOUT CONTRAST MRA HEAD WITHOUT CONTRAST MRA NECK WITHOUT AND WITH CONTRAST TECHNIQUE: Multiplanar, multi-echo pulse sequences of the brain and surrounding structures were acquired without intravenous contrast. Angiographic images of the Circle of Willis were acquired using MRA technique without intravenous contrast. Angiographic images of the neck were acquired using MRA technique without and with intravenous contrast. Carotid stenosis measurements (when applicable) are obtained utilizing NASCET criteria, using the distal internal carotid diameter as the denominator. CONTRAST:  36mL GADAVIST GADOBUTROL 1 MMOL/ML IV SOLN COMPARISON:  Head CT performed earlier today 03/30/2021. Brain MRI 12/26/2012. FINDINGS: MRI HEAD FINDINGS Brain: Mild generalized cerebral and cerebellar atrophy. There is a subdural hematoma overlying the left cerebral hemisphere measuring up to 10 mm. The subdural hematoma is characterized by precontrast T1 hyperintensity, T2 FLAIR hyperintensity and heterogeneous  SWI signal loss. This is  most compatible with a subacute subdural hemorrhage. The collection also demonstrates apparent restricted diffusion (which may reflect susceptibility artifact from blood products at this site). This collection does not appear significantly changed in size as compared to the head CT performed earlier today. As before, there is mild mass effect upon the underlying left cerebral hemisphere without midline shift. There is trace subacute subarachnoid hemorrhage overlying the left cerebral hemisphere (for instance as seen on series 15, image 31). Trace subacute subdural hemorrhage is also present along the falx and overlying the posterior right frontal lobe (for instance as seen on series 15, image 48) (series 9, image 12). Mild multifocal T2/FLAIR hyperintensity within the cerebral white matter and pons, nonspecific but compatible with chronic small vessel ischemic disease. There is no acute infarct. No evidence of an intracranial mass. Vascular: Expected proximal arterial flow voids. Skull and upper cervical spine: No focal marrow lesion. Sinuses/Orbits: Visualized orbits show no acute finding. Bilateral lens replacements. Trace scattered paranasal sinus mucosal thickening. MRA HEAD FINDINGS Anterior circulation: The intracranial internal carotid arteries are patent. The M1 middle cerebral arteries are patent. Mild/moderate stenosis within the proximal M1 segment of the right middle cerebral artery. Atherosclerotic irregularity of the M2 and more distal middle cerebral artery branches bilaterally. No M2 proximal branch occlusion or high-grade proximal stenosis is identified. The anterior cerebral arteries are patent. Motion artifact limits evaluation of the distal A2 and proximal A3 segments of the anterior cerebral arteries. There appear to be 2 anterior communicating arteries. 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 2  mm ventrally projecting vascular protrusion arising from the cavernous right ICA suspicious for additional aneurysm (series 5, image 77) (series 1062, image 236). 2 mm inferiorly projecting vascular protrusion arising from the supraclinoid left ICA, with an appearance most suggestive of a small aneurysm (series 1062, image 194) (series 105, image 86). 1-2 mm inferiorly projecting vascular protrusion arising from the supraclinoid right ICA, which may reflect an infundibulum or small aneurysm. Posterior circulation: The intracranial vertebral arteries are patent. The basilar artery is patent. The posterior cerebral arteries are patent. Posterior communicating arteries are hypoplastic or absent bilaterally. Anatomic variants: As described MRA NECK FINDINGS Aortic arch: Standard aortic branching. The visualized aortic arch is normal in caliber. No hemodynamically significant innominate or proximal subclavian artery stenosis. Right carotid system: The common carotid and internal carotid arteries are patent within the neck without hemodynamically significant stenosis (50% or greater). Left carotid system: The common carotid and internal carotid arteries are patent within the neck without hemodynamically significant stenosis (50% or greater). Vertebral arteries: Vertebral arteries are codominant and patent within the neck with antegrade flow. No hemodynamically significant stenosis within these vessels. MRI brain findings called by telephone at the time of interpretation on 03/30/2021 at 4:50 pm to provider Dr. Maximiano Coss, who verbally acknowledged these results. IMPRESSION: MRI brain: 1. Redemonstrated subacute subdural hematoma overlying left cerebral hemisphere, measuring up to 10 mm in thickness. There is apparent restricted diffusion within the collection, which may reflect susceptibility artifact from blood products at this site. However, correlate clinically to exclude any signs or symptoms that would suggest  superimposed infection/empyema. 2. Trace subacute subdural hemorrhage also present along the falx and overlying the posterior right frontal lobe. 3. Trace scattered subacute subarachnoid hemorrhage overlying the left cerebral hemisphere. 4. Mild chronic small vessel ischemic changes within the cerebral  white matter and pons. 5. Mild generalized parenchymal atrophy. MRA head: 1. Intracranial atherosclerotic disease, as described. Motion artifact limits evaluation of the anterior cerebral arteries at the A2/A3 junction. Within this limitation, no intracranial large vessel occlusion or proximal high-grade arterial stenosis is identified. 2. There appear to be 2 anterior communicating arteries. 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 3. 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 4. 2 mm aneurysm arising from the supraclinoid left ICA. 5. 1-2 mm inferiorly projecting vascular protrusion arising from the supraclinoid right ICA, which may reflect an infundibulum or small aneurysm. MRA neck: The common carotid, internal carotid and vertebral arteries are patent within the neck without hemodynamically significant stenosis. Electronically Signed   By: Kellie Simmering DO   On: 03/30/2021 16:51   MR BRAIN WO CONTRAST  Result Date: 03/30/2021 CLINICAL DATA:  Neuro deficit, acute, stroke suspected. Additional history provided: Headache. Evaluate for intracranial hemorrhage. Fall 2 weeks ago with history of subdural hematoma. EXAM: MRI HEAD WITHOUT CONTRAST MRA HEAD WITHOUT CONTRAST MRA NECK WITHOUT AND WITH CONTRAST TECHNIQUE: Multiplanar, multi-echo pulse sequences of the brain and surrounding structures were acquired without intravenous contrast. Angiographic images of the Circle of Willis were acquired using MRA technique without intravenous contrast. Angiographic images of the neck were acquired using MRA technique without and with intravenous contrast. Carotid  stenosis measurements (when applicable) are obtained utilizing NASCET criteria, using the distal internal carotid diameter as the denominator. CONTRAST:  9mL GADAVIST GADOBUTROL 1 MMOL/ML IV SOLN COMPARISON:  Head CT performed earlier today 03/30/2021. Brain MRI 12/26/2012. FINDINGS: MRI HEAD FINDINGS Brain: Mild generalized cerebral and cerebellar atrophy. There is a subdural hematoma overlying the left cerebral hemisphere measuring up to 10 mm. The subdural hematoma is characterized by precontrast T1 hyperintensity, T2 FLAIR hyperintensity and heterogeneous SWI signal loss. This is most compatible with a subacute subdural hemorrhage. The collection also demonstrates apparent restricted diffusion (which may reflect susceptibility artifact from blood products at this site). This collection does not appear significantly changed in size as compared to the head CT performed earlier today. As before, there is mild mass effect upon the underlying left cerebral hemisphere without midline shift. There is trace subacute subarachnoid hemorrhage overlying the left cerebral hemisphere (for instance as seen on series 15, image 31). Trace subacute subdural hemorrhage is also present along the falx and overlying the posterior right frontal lobe (for instance as seen on series 15, image 48) (series 9, image 12). Mild multifocal T2/FLAIR hyperintensity within the cerebral white matter and pons, nonspecific but compatible with chronic small vessel ischemic disease. There is no acute infarct. No evidence of an intracranial mass. Vascular: Expected proximal arterial flow voids. Skull and upper cervical spine: No focal marrow lesion. Sinuses/Orbits: Visualized orbits show no acute finding. Bilateral lens replacements. Trace scattered paranasal sinus mucosal thickening. MRA HEAD FINDINGS Anterior circulation: The intracranial internal carotid arteries are patent. The M1 middle cerebral arteries are patent. Mild/moderate stenosis within  the proximal M1 segment of the right middle cerebral artery. Atherosclerotic irregularity of the M2 and more distal middle cerebral artery branches bilaterally. No M2 proximal branch occlusion or high-grade proximal stenosis is identified. The anterior cerebral arteries are patent. Motion artifact limits evaluation of the distal A2 and proximal A3 segments of the anterior cerebral arteries. There appear to be 2 anterior communicating arteries. There is a 2 mm aneurysm arising from the more anterior of the anterior communicating arteries (  for instance as seen on series 5, image 107). 2 mm ventrally projecting vascular protrusion arising from the cavernous right ICA suspicious for additional aneurysm (series 5, image 77) (series 1062, image 236). 2 mm inferiorly projecting vascular protrusion arising from the supraclinoid left ICA, with an appearance most suggestive of a small aneurysm (series 1062, image 194) (series 105, image 86). 1-2 mm inferiorly projecting vascular protrusion arising from the supraclinoid right ICA, which may reflect an infundibulum or small aneurysm. Posterior circulation: The intracranial vertebral arteries are patent. The basilar artery is patent. The posterior cerebral arteries are patent. Posterior communicating arteries are hypoplastic or absent bilaterally. Anatomic variants: As described MRA NECK FINDINGS Aortic arch: Standard aortic branching. The visualized aortic arch is normal in caliber. No hemodynamically significant innominate or proximal subclavian artery stenosis. Right carotid system: The common carotid and internal carotid arteries are patent within the neck without hemodynamically significant stenosis (50% or greater). Left carotid system: The common carotid and internal carotid arteries are patent within the neck without hemodynamically significant stenosis (50% or greater). Vertebral arteries: Vertebral arteries are codominant and patent within the neck with antegrade flow.  No hemodynamically significant stenosis within these vessels. MRI brain findings called by telephone at the time of interpretation on 03/30/2021 at 4:50 pm to provider Dr. Maximiano Coss, who verbally acknowledged these results. IMPRESSION: MRI brain: 1. Redemonstrated subacute subdural hematoma overlying left cerebral hemisphere, measuring up to 10 mm in thickness. There is apparent restricted diffusion within the collection, which may reflect susceptibility artifact from blood products at this site. However, correlate clinically to exclude any signs or symptoms that would suggest superimposed infection/empyema. 2. Trace subacute subdural hemorrhage also present along the falx and overlying the posterior right frontal lobe. 3. Trace scattered subacute subarachnoid hemorrhage overlying the left cerebral hemisphere. 4. Mild chronic small vessel ischemic changes within the cerebral white matter and pons. 5. Mild generalized parenchymal atrophy. MRA head: 1. Intracranial atherosclerotic disease, as described. Motion artifact limits evaluation of the anterior cerebral arteries at the A2/A3 junction. Within this limitation, no intracranial large vessel occlusion or proximal high-grade arterial stenosis is identified. 2. There appear to be 2 anterior communicating arteries. 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 3. 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 4. 2 mm aneurysm arising from the supraclinoid left ICA. 5. 1-2 mm inferiorly projecting vascular protrusion arising from the supraclinoid right ICA, which may reflect an infundibulum or small aneurysm. MRA neck: The common carotid, internal carotid and vertebral arteries are patent within the neck without hemodynamically significant stenosis. Electronically Signed   By: Kellie Simmering DO   On: 03/30/2021 16:51        Scheduled Meds:  amLODipine  2.5 mg Oral Daily   vitamin C  1,000 mg Oral BID    atorvastatin  10 mg Oral QHS   levothyroxine  75 mcg Oral Q0600   losartan  100 mg Oral Daily   pantoprazole  40 mg Oral QHS   thiamine  100 mg Oral Daily   ursodiol  300 mg Oral Daily   vitamin B-12  250 mcg Oral Daily   Continuous Infusions:  Assessment & Plan:   Principal Problem:   Stroke-like symptoms Active Problems:   Hyperlipidemia   Hypothyroidism   CKD (chronic kidney disease) stage 3, GFR 30-59 ml/min (HCC)   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HCC)   Essential hypertension, benign   Carotid  artery disease ([place])   Overweight (BMI 25.0-29.9)   Primary biliary cholangitis (HCC)   History of basal cell carcinoma (BCC)   SDH (subdural hematoma) (HCC)   TIA (transient ischemic attack)   # Strokelike symptoms-expressive aphasia # History of subdural hematoma - Focal seizure cannot be excluded at this time given history of subdural hematoma - CT head without contrast read as above - Neurology has been consulted, Dr. Curly Shores says neurology service will follow patient on 03/31/2021 - MRI of the brain - Neurologist recommends: MRA of the head without contrast, MRA of the neck with contrast - EEG ordered, B12 a.m. lab ordered - Fasting lipid and A1c ordered - Blood pressure goal, maintain SPB < 160 - Hydralazine 25 mg p.o. every 6 hours as needed for SBP greater than 160, 3 doses ordered - Frequent neuro vascular checks - Fall precaution   7/6- neurology consulted, possible  Ddx TIA vs seizure.  Echo ordered. Neurology will reveiwe EEG.    # CT read of possible superimposed infection/empyema on the redemonstrated subacute subdural hematoma overlying the left cerebral hemisphere measuring 10 mm in thickness.  There is apparent restricted diffusion within the collection, which may reflect susceptibility artifact from blood products. 7/6-neurosurgery consulted.   # Mild acute on chronic hyponatremia-SIADH cannot be excluded at this time - Baseline serum  sodium level is 127-129 7/6 improved. Na 132 today   # History of subdural hematoma-was discharged home with Keppra 500 mg twice daily starting 03/14/2021 to 03/20/2021 for seizure prophylaxis 7/6completed the course   # Low TSH value on 5/26-we will check TSH today and T4 - TSH on 02/18/2021 was 0.02  # Hyperlipidemia-atorvastatin 10 mg nightly # Hypothyroid-levothyroxine 75 mcg in the a.m. # Primary hypertension-Home medications include amlodipine 2.5 mg daily, losartan 100 mg daily             DVT prophylaxis: scd Code Status:full Family Communication: daughter Disposition Plan:  Status is: Observation  The patient remains OBS appropriate and will d/c before 2 midnights.  Dispo: The patient is from: Home              Anticipated d/c is to: Home              Patient currently is not medically stable to d/c.   Difficult to place patient No            LOS: 0 days   Time spent: 35 minutes with more than 50% on [place], MD [facility] Pager [phone]  If 7PM-7AM, please contact night-coverage 03/31/2021, 8:24 AM

## 2021-03-31 NOTE — Consult Note (Signed)
NEUROLOGY CONSULTATION NOTE   Date of service: March 31, 2021 Patient Name: Caitlin Jenkins MRN:  [id number] DOB:  November 30, 1931 Reason for consult: transient expressive aphasia _ _ _   _ __   _ __ _ _  __ __   _ __   __ _  History of Present Illness   Caitlin Jenkins is a 87 y.o. female with PMH significant for recent traumatic L SDH (2 wks ago), HTN, HL, CKD who is admitted after spell of transient expressive aphasia x45 minutes on 7/4. She had just finished completing a sudoko puzzle and was talking to her daughter when she began having trouble getting her words out. She remembers the event as "feeling like her words were scrambled." This resolved after 45 minutes and has not recurred. Denies focal weakness or numbness, headache, gait impairment, or dizziness. No current neurologic complaints. She was discharged from admission to [facility] 2 wks ago for traumatic L SDH on keppra 500mg  bid for 7 day course which she had already completed at time of this event. MRI brain showed stable subacute subdural hematoma overlying L cerebral hemisphere up to 16mm in thickness. No e/o acute infarct. MRA head showed no intracranial high-grade stenosis or occlusion but did show multiple aneurysms as described below up to 2 mm. MRA neck showed no hemodynamically significant stenoses. rEEG performed today and interpreted by me was normal. Radiology brought up possibility of superimposed infection/empyema but NSU evaluated patient today and was not concerned for this.   Workup this admission:  MRI brain:   1. Redemonstrated subacute subdural hematoma overlying left cerebral hemisphere, measuring up to 10 mm in thickness. There is apparent restricted diffusion within the collection, which may reflect susceptibility artifact from blood products at this site. However, correlate clinically to exclude any signs or symptoms that would suggest superimposed infection/empyema. 2. Trace subacute subdural hemorrhage also  present along the falx and overlying the posterior right frontal lobe. 3. Trace scattered subacute subarachnoid hemorrhage overlying the left cerebral hemisphere. 4. Mild chronic small vessel ischemic changes within the cerebral white matter and pons. 5. Mild generalized parenchymal atrophy.   MRA head:   1. Intracranial atherosclerotic disease, as described. Motion artifact limits evaluation of the anterior cerebral arteries at the A2/A3 junction. Within this limitation, no intracranial large vessel occlusion or proximal high-grade arterial stenosis is identified. 2. There appear to be 2 anterior communicating arteries. 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 3. 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 4. 2 mm aneurysm arising from the supraclinoid left ICA. 5. 1-2 mm inferiorly projecting vascular protrusion arising from the supraclinoid right ICA, which may reflect an infundibulum or small aneurysm.   MRA neck:   The common carotid, internal carotid and vertebral arteries are patent within the neck without hemodynamically significant stenosis.  CNS imaging personally reviewed; I agree with above interpretation.    ROS   Per HPI; all other systems reviewed and are negative  Past History   Past Medical History:  Diagnosis Date   Biliary cirrhosis ([place])    Cancer of skin of leg    Cellulitis    CKD (chronic kidney disease) stage 3, GFR 30-59 ml/min (HCC)    Gout    Hiatal hernia Oct 2015   8.5cm   Hiatal hernia 06/26/2014   8.5cm    History of basal cell carcinoma (BCC) 09/12/2018   Oct 2016; Moh's surgery; right antitragus  History of diverticulitis 2000   Hyperlipidemia    Hyperparathyroidism, secondary renal ([place]) 07/11/2018   Managed by nephrologist   Hypertension    Hypomagnesemia 07/03/2019   Hypothyroidism    Skin cancer of nose    Past Surgical History:  Procedure Laterality Date    ABDOMINAL HYSTERECTOMY  1995   CATARACT EXTRACTION     FOOT SURGERY  2000   HEMORRHOID SURGERY     THYROID SURGERY  1999   para thyroid   Family History  Problem Relation Age of Onset   Emphysema Father    Asthma Father    Hypertension Daughter    Heart disease Daughter        3 stents   Diabetes Daughter    Hypertension Son    Cancer Son        prostate   Diabetes Son    Heart disease Daughter        heart attack, 2 stents   Multiple sclerosis Daughter    Diabetes Daughter    Fibromyalgia Daughter    Diabetes Daughter    Social History   Socioeconomic History   Marital status: Widowed    Spouse name: Joe   Number of children: 4   Years of education: some college   Highest education level: 12th grade  Occupational History   Occupation: Retired  Tobacco Use   Smoking status: Never   Smokeless tobacco: Never   Tobacco comments:    smoking cessation materials not required  Vaping Use   Vaping Use: Never used  Substance and Sexual Activity   Alcohol use: No   Drug use: No   Sexual activity: Not Currently  Other Topics Concern   Not on file  Social History Narrative   Pt lives alone.    Active working in her yard with over 200 rose bushes.    Social Determinants of Health   Financial Resource Strain: Low Risk    Difficulty of Paying Living Expenses: Not hard at all  Food Insecurity: No Food Insecurity   Worried About Charity fundraiser in the Last Year: Never true   [place] in the Last Year: Never true  Transportation Needs: No Transportation Needs   Lack of Transportation (Medical): No   Lack of Transportation (Non-Medical): No  Physical Activity: Inactive   Days of Exercise per Week: 0 days   Minutes of Exercise per Session: 0 min  Stress: No Stress Concern Present   Feeling of Stress : Not at all  Social Connections: Moderately Isolated   Frequency of Communication with Friends and Family: More than three times a week   Frequency of Social  Gatherings with Friends and Family: Three times a week   Attends Religious Services: More than 4 times per year   Active Member of Clubs or Organizations: No   Attends Archivist Meetings: Never   Marital Status: Widowed   Allergies  Allergen Reactions   Indomethacin Hives   Levofloxacin In D5w Other (See Comments)    Tendinopathy and myalgia   Ace Inhibitors Swelling    Other reaction(s): SWELLING Facial swelling. Facial swelling.   Augmentin [Amoxicillin-Pot Clavulanate] Other (See Comments)    Other reaction(s): Unknown   Elemental Sulfur Other (See Comments)   Penicillins Nausea And Vomiting    Has patient had a PCN reaction causing immediate rash, facial/tongue/throat swelling, SOB or lightheadedness with hypotension: Yes Has patient had a PCN reaction causing severe rash involving mucus membranes or  skin necrosis: No Has patient had a PCN reaction that required hospitalization: No Has patient had a PCN reaction occurring within the last 10 years: No If all of the above answers are "NO", then may proceed with Cephalosporin use.   Sulfa Antibiotics Swelling and Other (See Comments)    Medications   Medications Prior to Admission  Medication Sig Dispense Refill Last Dose   acetaminophen (TYLENOL) 325 MG tablet Take 650 mg by mouth 2 (two) times daily.   03/30/2021 at 0900   amLODipine (NORVASC) 2.5 MG tablet Take 1 tablet (2.5 mg total) by mouth daily. 90 tablet 3 03/30/2021 at 0900   Ascorbic Acid (VITAMIN C) 1000 MG tablet Take 1,000 mg by mouth 2 (two) times a day.   03/30/2021 at 0900   Cholecalciferol (VITAMIN D-3) 25 MCG (1000 UT) CAPS Take 1 capsule by mouth daily.   03/30/2021 at 0900   COLACE 100 MG capsule Take 100 mg by mouth daily.    03/30/2021 at 0900   levothyroxine (SYNTHROID) 75 MCG tablet Take 1 tablet (75 mcg total) by mouth daily. 90 tablet 0 03/30/2021 at 0830   losartan (COZAAR) 100 MG tablet Take 1 tablet by mouth daily (Avoid salt substitutes, no fruit  smoothies.) 90 tablet 3 03/30/2021 at 0900   niacinamide 500 MG tablet Take 1 tablet (500 mg total) by mouth 2 (two) times daily with a meal. 180 tablet 1 03/30/2021 at 0900   OVER THE COUNTER MEDICATION Take 1 capsule by mouth daily. CBD oil (Active Gold Label)   03/30/2021 at 0900   Probiotic Product (PROBIOTIC DAILY PO) Take by mouth.   03/30/2021 at 0900   thiamine 100 MG tablet Take 100 mg by mouth daily.   03/30/2021 at 0900   Turmeric 450 MG CAPS Take 500 mg by mouth daily.    03/30/2021 at 0900   ursodiol (ACTIGALL) 300 MG capsule Take 1 capsule (300 mg total) by mouth daily. 90 capsule 3 03/30/2021 at 0900   vitamin B-12 (CYANOCOBALAMIN) 250 MCG tablet Take 250 mcg by mouth daily.   03/30/2021 at 0900   atorvastatin (LIPITOR) 10 MG tablet Take 1 tablet (10 mg total) by mouth at bedtime. (do not take for 3 days after taking colchicine) 90 tablet 3 prn at prn   Colchicine 0.6 MG CAPS Take 0.6 mg by mouth daily as needed. 30 capsule 0 prn` at prn   levETIRAcetam (KEPPRA) 500 MG tablet Take 500 mg by mouth 2 (two) times daily. (Patient not taking: No sig reported)   Not Taking   levofloxacin (LEVAQUIN) 500 MG tablet Take 500 mg by mouth daily. (Patient not taking: Reported on 03/30/2021)   Not Taking   meclizine (ANTIVERT) 25 MG tablet Take 0.5-1 tablets (12.5-25 mg total) by mouth 3 (three) times daily as needed for dizziness. 30 tablet 0 prn at prn   nitrofurantoin, macrocrystal-monohydrate, (MACROBID) 100 MG capsule Take 100 mg by mouth 2 (two) times daily. (Patient not taking: Reported on 03/30/2021)   Not Taking   pantoprazole (PROTONIX) 40 MG tablet Take 1 tablet (40 mg total) by mouth at bedtime. (Patient not taking: No sig reported) 30 tablet 1 Not Taking   zinc gluconate 50 MG tablet Take 50 mg by mouth daily. (Patient not taking: Reported on 03/30/2021)   Not Taking     Vitals   Vitals:   03/31/21 0826 03/31/21 1238 03/31/21 1601 03/31/21 2005  BP: (!) 165/92 (!) 150/97 (!) 145/77 (!) 157/88  Pulse:  77 85 87  Resp: 14 18 16 16   Temp: 97.8 F (36.6 C) 97.7 F (36.5 C) 97.6 F (36.4 C) 98.2 F (36.8 C)  TempSrc: Oral   Oral  SpO2: 98% 100% 98% 97%  Weight:      Height:         Body mass index is 24.37 kg/m.  Physical Exam   Physical Exam Gen: A&O x4, NAD HEENT: Atraumatic, normocephalic;mucous membranes moist; oropharynx clear, tongue without atrophy or fasciculations. Neck: Supple, trachea midline. Resp: CTAB, no w/r/r CV: RRR, no m/g/r; nml S1 and S2. 2+ symmetric peripheral pulses. Abd: soft/NT/ND; nabs x 4 quad Extrem: Nml bulk; no cyanosis, clubbing, or edema.  Neuro: *MS: A&O x4. Follows multi-step commands.  *Speech: fluid, nondysarthric, able to name and repeat *CN:    I: Deferred   II,III: PERRLA, VFF by confrontation, optic discs not visualized 2/2 pupillary constriction   III,IV,VI: EOMI w/o nystagmus, no ptosis   V: Sensation intact from V1 to V3 to LT   VII: Eyelid closure was full.  Smile symmetric.   VIII: Hearing intact to voice   IX,X: Voice normal, palate elevates symmetrically    XI: SCM/trap 5/5 bilat   XII: Tongue protrudes midline, no atrophy or fasciculations   *Motor:   Normal bulk.  No tremor, rigidity or bradykinesia. No pronator drift.    Strength: Dlt Bic Tri WrE WrF FgS Gr HF KnF KnE PlF DoF    Left 5 5 5 5 5 5 5 5 5 5 5 5     Right 5 5 5 5 5 5 5 5 5 5 5 5     *Sensory: Intact to light touch, pinprick, temperature vibration throughout. Symmetric. Propioception intact bilat.  No double-simultaneous extinction.  *Coordination:  Finger-to-nose, heel-to-shin, rapid alternating motions were intact. *Reflexes:  2+ and symmetric throughout without clonus; toes down-going bilat *Gait: deferred  NIHSS = 0  Premorbid mRS = 1   Labs   CBC:  Recent Labs  Lab 03/30/21 1254 03/31/21 0410  WBC 9.2 8.3  HGB 13.0 11.9*  HCT 38.4 35.2*  MCV 89.3 90.0  PLT 254 102    Basic Metabolic Panel:  Lab Results  Component Value Date   NA  132 (L) 03/31/2021   K 4.0 03/31/2021   CO2 25 03/31/2021   GLUCOSE 77 03/31/2021   BUN 15 03/31/2021   CREATININE 0.95 03/31/2021   CALCIUM 9.8 03/31/2021   GFRNONAA 58 (L) 03/31/2021   GFRAA 32 (L) 10/30/2020   Lipid Panel:  Lab Results  Component Value Date   LDLCALC 73 03/31/2021   HgbA1c:  Lab Results  Component Value Date   HGBA1C 5.4 03/31/2021   Urine Drug Screen: No results found for: LABOPIA, COCAINSCRNUR, LABBENZ, AMPHETMU, THCU, LABBARB  Alcohol Level No results found for: [facility]    Impression   Oree RAINELLE SULEWSKI is a 87 y.o. female with PMH significant for recent traumatic L SDH (2 wks ago), HTN, HL, CKD who is admitted after spell of transient expressive aphasia x45 minutes on 7/4. Ddx includes TIA vs seizure 2/2 cortical irritation. MRI brain showed no e/o acute infarct. rEEG WNL  Recommendations   - F/u TTE, LDL, A1c to complete TIA w/u - Hold antiplatelets at this time given subacute traumatic SDH - Agree with NSU - no clinical concern for CNS infection/empyema - NSU will conservatively observe very small aneurysms seen on MRA head; these are not felt to have contributed to recent SDH w/ small SAH -  No indication for AED at this time. Would consider empiric restart of keppra 500mg  bid if she has another clinical event c/f possible seizure - F/u with outpatient neurology as scheduled  Will continue to follow ______________________________________________________________________   Thank you for the opportunity to take part in the care of this patient. If you have any further questions, please contact the neurology consultation attending.  Signed,  Su Monks, MD [facility] [phone]  If 7pm- 7am, please page neurology on call as listed in [place].

## 2021-04-01 ENCOUNTER — Other Ambulatory Visit: Payer: Self-pay | Admitting: Neurosurgery

## 2021-04-01 ENCOUNTER — Observation Stay (HOSPITAL_BASED_OUTPATIENT_CLINIC_OR_DEPARTMENT_OTHER)
Admit: 2021-04-01 | Discharge: 2021-04-01 | Disposition: A | Discharge status: Home / Self Care | Payer: PPO | Attending: Neurology | Admitting: Neurology

## 2021-04-01 ENCOUNTER — Other Ambulatory Visit (HOSPITAL_COMMUNITY): Payer: Self-pay | Admitting: Neurosurgery

## 2021-04-01 DIAGNOSIS — S065XAA Traumatic subdural hemorrhage with loss of consciousness status unknown, initial encounter: Secondary | ICD-10-CM

## 2021-04-01 DIAGNOSIS — S065X9A Traumatic subdural hemorrhage with loss of consciousness of unspecified duration, initial encounter: Secondary | ICD-10-CM

## 2021-04-01 DIAGNOSIS — R2990 Unspecified symptoms and signs involving the nervous system: Secondary | ICD-10-CM | POA: Diagnosis not present

## 2021-04-01 DIAGNOSIS — G459 Transient cerebral ischemic attack, unspecified: Secondary | ICD-10-CM

## 2021-04-01 LAB — ECHOCARDIOGRAM COMPLETE BUBBLE STUDY
AR max vel: 3.21 cm2
AV Area VTI: 3.71 cm2
AV Area mean vel: 3.8 cm2
AV Mean grad: 4 mmHg
AV Peak grad: 8 mmHg
Ao pk vel: 1.41 m/s
Area-P 1/2: 3.4 cm2
S' Lateral: 1.77 cm

## 2021-04-01 MED ORDER — AMLODIPINE BESYLATE 5 MG PO TABS: 5.0000 mg | ORAL_TABLET | Freq: Every day | ORAL | 0 refills | Status: DC

## 2021-04-01 MED ORDER — AMLODIPINE BESYLATE 5 MG PO TABS: 5.0000 mg | ORAL_TABLET | Freq: Every day | ORAL | Status: DC

## 2021-04-01 NOTE — Progress Notes (Signed)
*  PRELIMINARY RESULTS* Echocardiogram 2D Echocardiogram has been performed.  Sherrie Sport 04/01/2021, 8:54 AM

## 2021-04-01 NOTE — Plan of Care (Signed)
  Problem: Education: Goal: Knowledge of General Education information will improve Description: Including pain rating scale, medication(s)/side effects and non-pharmacologic comfort measures Outcome: Adequate for Discharge   Problem: Health Behavior/Discharge Planning: Goal: Ability to manage health-related needs will improve Outcome: Adequate for Discharge   Problem: Clinical Measurements: Goal: Ability to maintain clinical measurements within normal limits will improve Outcome: Adequate for Discharge Goal: Will remain free from infection Outcome: Adequate for Discharge Goal: Diagnostic test results will improve Outcome: Adequate for Discharge Goal: Respiratory complications will improve Outcome: Adequate for Discharge Goal: Cardiovascular complication will be avoided Outcome: Adequate for Discharge   Problem: Activity: Goal: Risk for activity intolerance will decrease Outcome: Adequate for Discharge   Problem: Nutrition: Goal: Adequate nutrition will be maintained Outcome: Adequate for Discharge   Problem: Coping: Goal: Level of anxiety will decrease Outcome: Adequate for Discharge   Problem: Elimination: Goal: Will not experience complications related to bowel motility Outcome: Adequate for Discharge Goal: Will not experience complications related to urinary retention Outcome: Adequate for Discharge   Problem: Pain Managment: Goal: General experience of comfort will improve Outcome: Adequate for Discharge   Problem: Safety: Goal: Ability to remain free from injury will improve Outcome: Adequate for Discharge   Problem: Skin Integrity: Goal: Risk for impaired skin integrity will decrease Outcome: Adequate for Discharge   Problem: Education: Goal: Knowledge of disease or condition will improve Outcome: Adequate for Discharge Goal: Knowledge of secondary prevention will improve Outcome: Adequate for Discharge Goal: Knowledge of patient specific risk factors  addressed and post discharge goals established will improve Outcome: Adequate for Discharge   Problem: Self-Care: Goal: Ability to participate in self-care as condition permits will improve Outcome: Adequate for Discharge Goal: Verbalization of feelings and concerns over difficulty with self-care will improve Outcome: Adequate for Discharge Goal: Ability to communicate needs accurately will improve Outcome: Adequate for Discharge   Problem: Nutrition: Goal: Risk of aspiration will decrease Outcome: Adequate for Discharge Goal: Dietary intake will improve Outcome: Adequate for Discharge   Problem: Ischemic Stroke/TIA Tissue Perfusion: Goal: Complications of ischemic stroke/TIA will be minimized Outcome: Adequate for Discharge

## 2021-04-01 NOTE — Discharge Summary (Signed)
Caitlin Jenkins RAQ:[id number] DOB: 1932/08/19 DOA: 03/30/2021  PCP: Delsa Grana, PA-C  Admit date: 03/30/2021 Discharge date: 04/01/2021  Admitted From: Home Disposition: Home  Recommendations for Outpatient Follow-up:  Follow up with PCP in 1 week Please obtain BMP/CBC in one week Please follow up neurology in 1 to 2 weeks Follow-up with neurosurgery in 2 weeks  Home Health: Yes   Discharge Condition:Stable CODE STATUS: Full Diet recommendation: Heart Healthy  Brief/Interim Summary: Per HPI: Caitlin Jenkins is a 87 y.o. female with medical history significant for primary hypertension, hypothyroid, hyperlipidemia, history of subdural hematoma secondary to trauma, presents to the emergency department for chief concerns of difficulty speaking.  Patient was playing sudoku and had completed her surgical glue at approximately 6:45 PM on 03/29/2021.  Patient developed difficulty speaking and difficulty breathing.Prior to the trauma and subdural hematoma on 03/12/2021 patient has never experienced the symptoms before.   # Strokelike symptoms-expressive aphasia Neurology was consulted Ddx includes TIA vs seizure 2/2 cortical irritation. MRI brain showed no e/o acute infarct.  rEEG WNL Echo cardiogram with mild LVH.  Normal EF. Holding antiplatelets at this time given subacute traumatic SDH LDL goal less than 70 Follow-up with PCP for further evaluation and management no indication for AED at this time.  Would consider empiric restart of Keppra 500 mg twice daily if she has another clinical event C/F possible seizure per neurology BP control  # History of subdural hematoma ?empyema on image Neurosurgery consulted-Given the clinical presentation, radiographic appearance and her normal ESR, I strongly support that she has a subdural hematoma rather than empyema. Recommended observing, and f/u with neurosurgery clinic in 2 weeks.  Per neurosurgery also conservatively manage the aneurysms  found on the MRA - these are small and unlikely to have caused her subdural hematoma.    # Mild acute on chronic hyponatremia-Improved with IV fluids   # History of subdural hematoma-was discharged home with Keppra 500 mg twice daily starting 03/14/2021 to 03/20/2021 for seizure prophylaxis previously See results above   # Hyperlipidemia-atorvastatin 10 mg nightly # Hypothyroid-continue levothyroxine  # Primary hypertension-needs good BP control.  Continue meds at discharge.  Follow with PCP for further management          Discharge Diagnoses:  Principal Problem:   Stroke-like symptoms Active Problems:   Hyperlipidemia   Hypothyroidism   CKD (chronic kidney disease) stage 3, GFR 30-59 ml/min (HCC)   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HCC)   Essential hypertension, benign   Carotid artery disease (HCC)   Overweight (BMI 25.0-29.9)   Primary biliary cholangitis (HCC)   History of basal cell carcinoma (BCC)   SDH (subdural hematoma) (HCC)   TIA (transient ischemic attack)   Malnutrition of moderate degree    Discharge Instructions  Discharge Instructions     Call MD for:  persistant nausea and vomiting   Complete by: As directed    Diet - low sodium heart healthy   Complete by: As directed    Discharge instructions   Complete by: As directed    Follow up with Dr. Lovie Chol. [facility] neuro[facility] in 2 weeks   Increase activity slowly   Complete by: As directed       Allergies as of 04/01/2021       Reactions   Indomethacin Hives   Levofloxacin In D5w Other (See Comments)   Tendinopathy and myalgia   Ace Inhibitors Swelling   Other reaction(s): SWELLING Facial swelling. Facial swelling.  Augmentin [amoxicillin-pot Clavulanate] Other (See Comments)   Other reaction(s): Unknown   Elemental Sulfur Other (See Comments)   Penicillins Nausea And Vomiting   Has patient had a PCN reaction causing immediate rash, facial/tongue/throat swelling,  SOB or lightheadedness with hypotension: Yes Has patient had a PCN reaction causing severe rash involving mucus membranes or skin necrosis: No Has patient had a PCN reaction that required hospitalization: No Has patient had a PCN reaction occurring within the last 10 years: No If all of the above answers are "NO", then may proceed with Cephalosporin use.   Sulfa Antibiotics Swelling, Other (See Comments)        Medication List     STOP taking these medications    levETIRAcetam 500 MG tablet Commonly known as: KEPPRA   levofloxacin 500 MG tablet Commonly known as: LEVAQUIN   nitrofurantoin (macrocrystal-monohydrate) 100 MG capsule Commonly known as: MACROBID   OVER THE COUNTER MEDICATION   zinc gluconate 50 MG tablet       TAKE these medications    acetaminophen 325 MG tablet Commonly known as: TYLENOL Take 650 mg by mouth 2 (two) times daily.   amLODipine 5 MG tablet Commonly known as: NORVASC Take 1 tablet (5 mg total) by mouth daily. What changed:  medication strength how much to take   atorvastatin 10 MG tablet Commonly known as: LIPITOR Take 1 tablet (10 mg total) by mouth at bedtime. (do not take for 3 days after taking colchicine)   Colace 100 MG capsule Generic drug: docusate sodium Take 100 mg by mouth daily.   Colchicine 0.6 MG Caps Take 0.6 mg by mouth daily as needed.   levothyroxine 75 MCG tablet Commonly known as: SYNTHROID Take 1 tablet (75 mcg total) by mouth daily.   losartan 100 MG tablet Commonly known as: COZAAR Take 1 tablet by mouth daily (Avoid salt substitutes, no fruit smoothies.)   meclizine 25 MG tablet Commonly known as: ANTIVERT Take 0.5-1 tablets (12.5-25 mg total) by mouth 3 (three) times daily as needed for dizziness.   niacinamide 500 MG tablet Take 1 tablet (500 mg total) by mouth 2 (two) times daily with a meal.   pantoprazole 40 MG tablet Commonly known as: PROTONIX Take 1 tablet (40 mg total) by mouth at  bedtime.   PROBIOTIC DAILY PO Take by mouth.   thiamine 100 MG tablet Take 100 mg by mouth daily.   Turmeric 450 MG Caps Take 500 mg by mouth daily.   ursodiol 300 MG capsule Commonly known as: ACTIGALL Take 1 capsule (300 mg total) by mouth daily.   vitamin B-12 250 MCG tablet Commonly known as: CYANOCOBALAMIN Take 250 mcg by mouth daily.   vitamin C 1000 MG tablet Take 1,000 mg by mouth 2 (two) times a day.   Vitamin D-3 25 MCG (1000 UT) Caps Take 1 capsule by mouth daily.        Follow-up Information     Meade Maw, MD Follow up in 2 week(s).   Specialty: Neurosurgery Contact information: [place] [address] [phone]         Vladimir Crofts, MD Follow up in 1 week(s).   Specialty: Neurology Contact information: [place] [facility] [place] [address] [phone]         Delsa Grana, PA-C Follow up in 1 week(s).   Specialty: Family Medicine Contact information: [address] [place]  [address] [phone]  Allergies  Allergen Reactions   Indomethacin Hives   Levofloxacin In D5w Other (See Comments)    Tendinopathy and myalgia   Ace Inhibitors Swelling    Other reaction(s): SWELLING Facial swelling. Facial swelling.   Augmentin [Amoxicillin-Pot Clavulanate] Other (See Comments)    Other reaction(s): Unknown   Elemental Sulfur Other (See Comments)   Penicillins Nausea And Vomiting    Has patient had a PCN reaction causing immediate rash, facial/tongue/throat swelling, SOB or lightheadedness with hypotension: Yes Has patient had a PCN reaction causing severe rash involving mucus membranes or skin necrosis: No Has patient had a PCN reaction that required hospitalization: No Has patient had a PCN reaction occurring within the last 10 years: No If all of the above answers are "NO", then may proceed with Cephalosporin use.   Sulfa  Antibiotics Swelling and Other (See Comments)    Consultations: Neurology, neurosurgery   Procedures/Studies: CT Head Wo Contrast  Result Date: 03/30/2021 CLINICAL DATA:  Headache. Evaluate for intracranial hemorrhage. Fall 2 weeks ago and history of subdural hematoma. EXAM: CT HEAD WITHOUT CONTRAST TECHNIQUE: Contiguous axial images were obtained from the base of the skull through the vertex without intravenous contrast. COMPARISON:  CT face 08/28/2008 and CT report from [facility] dated 03/13/2021 FINDINGS: Brain: Subtle effacement of the sulci along the left temporal lobe and left parietal lobe region. Evidence for a small isodense subdural collection along the left convexity. This subdural collection measures roughly 3-4 mm. No evidence for midline shift. No hydrocephalus. No large area of infarct. Small amount of low-density in the periventricular white matter is suggestive for chronic changes. Vascular: No hyperdense vessel or unexpected calcification. Skull: Normal. Negative for fracture or focal lesion. Sinuses/Orbits: Again noted is a small osteoma at the junction of the left frontal sinus and ethmoid air cells. Other: None IMPRESSION: Small isodense left-sided subdural collection. This corresponds with the known subdural hematoma from the previous report on 03/13/2021. Based on the previous report, this subdural collection continues to decrease in size. No significant midline shift. These results were called by telephone at the time of interpretation on 03/30/2021 at 1:53 pm to provider [facility] , who verbally acknowledged these results. Electronically Signed   By: Markus Daft M.D.   On: 03/30/2021 13:53   MR ANGIO HEAD WO CONTRAST  Result Date: 03/30/2021 CLINICAL DATA:  Neuro deficit, acute, stroke suspected. Additional history provided: Headache. Evaluate for intracranial hemorrhage. Fall 2 weeks ago with history of subdural hematoma. EXAM: MRI HEAD WITHOUT CONTRAST  MRA HEAD WITHOUT CONTRAST MRA NECK WITHOUT AND WITH CONTRAST TECHNIQUE: Multiplanar, multi-echo pulse sequences of the brain and surrounding structures were acquired without intravenous contrast. Angiographic images of the Circle of Willis were acquired using MRA technique without intravenous contrast. Angiographic images of the neck were acquired using MRA technique without and with intravenous contrast. Carotid stenosis measurements (when applicable) are obtained utilizing NASCET criteria, using the distal internal carotid diameter as the denominator. CONTRAST:  13m GADAVIST GADOBUTROL 1 MMOL/ML IV SOLN COMPARISON:  Head CT performed earlier today 03/30/2021. Brain MRI 12/26/2012. FINDINGS: MRI HEAD FINDINGS Brain: Mild generalized cerebral and cerebellar atrophy. There is a subdural hematoma overlying the left cerebral hemisphere measuring up to 10 mm. The subdural hematoma is characterized by precontrast T1 hyperintensity, T2 FLAIR hyperintensity and heterogeneous SWI signal loss. This is most compatible with a subacute subdural hemorrhage. The collection also demonstrates apparent restricted diffusion (which may reflect susceptibility artifact from blood products at this site).  This collection does not appear significantly changed in size as compared to the head CT performed earlier today. As before, there is mild mass effect upon the underlying left cerebral hemisphere without midline shift. There is trace subacute subarachnoid hemorrhage overlying the left cerebral hemisphere (for instance as seen on series 15, image 31). Trace subacute subdural hemorrhage is also present along the falx and overlying the posterior right frontal lobe (for instance as seen on series 15, image 48) (series 9, image 12). Mild multifocal T2/FLAIR hyperintensity within the cerebral white matter and pons, nonspecific but compatible with chronic small vessel ischemic disease. There is no acute infarct. No evidence of an intracranial  mass. Vascular: Expected proximal arterial flow voids. Skull and upper cervical spine: No focal marrow lesion. Sinuses/Orbits: Visualized orbits show no acute finding. Bilateral lens replacements. Trace scattered paranasal sinus mucosal thickening. MRA HEAD FINDINGS Anterior circulation: The intracranial internal carotid arteries are patent. The M1 middle cerebral arteries are patent. Mild/moderate stenosis within the proximal M1 segment of the right middle cerebral artery. Atherosclerotic irregularity of the M2 and more distal middle cerebral artery branches bilaterally. No M2 proximal branch occlusion or high-grade proximal stenosis is identified. The anterior cerebral arteries are patent. Motion artifact limits evaluation of the distal A2 and proximal A3 segments of the anterior cerebral arteries. There appear to be 2 anterior communicating arteries. 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 2 mm ventrally projecting vascular protrusion arising from the cavernous right ICA suspicious for additional aneurysm (series 5, image 77) (series 1062, image 236). 2 mm inferiorly projecting vascular protrusion arising from the supraclinoid left ICA, with an appearance most suggestive of a small aneurysm (series 1062, image 194) (series 105, image 86). 1-2 mm inferiorly projecting vascular protrusion arising from the supraclinoid right ICA, which may reflect an infundibulum or small aneurysm. Posterior circulation: The intracranial vertebral arteries are patent. The basilar artery is patent. The posterior cerebral arteries are patent. Posterior communicating arteries are hypoplastic or absent bilaterally. Anatomic variants: As described MRA NECK FINDINGS Aortic arch: Standard aortic branching. The visualized aortic arch is normal in caliber. No hemodynamically significant innominate or proximal subclavian artery stenosis.  Right carotid system: The common carotid and internal carotid arteries are patent within the neck without hemodynamically significant stenosis (50% or greater). Left carotid system: The common carotid and internal carotid arteries are patent within the neck without hemodynamically significant stenosis (50% or greater). Vertebral arteries: Vertebral arteries are codominant and patent within the neck with antegrade flow. No hemodynamically significant stenosis within these vessels. MRI brain findings called by telephone at the time of interpretation on 03/30/2021 at 4:50 pm to provider Dr. Maximiano Coss, who verbally acknowledged these results. IMPRESSION: MRI brain: 1. Redemonstrated subacute subdural hematoma overlying left cerebral hemisphere, measuring up to 10 mm in thickness. There is apparent restricted diffusion within the collection, which may reflect susceptibility artifact from blood products at this site. However, correlate clinically to exclude any signs or symptoms that would suggest superimposed infection/empyema. 2. Trace subacute subdural hemorrhage also present along the falx and overlying the posterior right frontal lobe. 3. Trace scattered subacute subarachnoid hemorrhage overlying the left cerebral hemisphere. 4. Mild chronic small vessel ischemic changes within the cerebral white matter and pons. 5. Mild generalized parenchymal atrophy. MRA head: 1. Intracranial atherosclerotic disease, as described. Motion artifact limits evaluation of the anterior cerebral arteries at the A2/A3 junction. Within  this limitation, no intracranial large vessel occlusion or proximal high-grade arterial stenosis is identified. 2. There appear to be 2 anterior communicating arteries. 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 3. 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 4. 2 mm aneurysm arising from the supraclinoid left ICA. 5. 1-2 mm inferiorly projecting  vascular protrusion arising from the supraclinoid right ICA, which may reflect an infundibulum or small aneurysm. MRA neck: The common carotid, internal carotid and vertebral arteries are patent within the neck without hemodynamically significant stenosis. Electronically Signed   By: Kellie Simmering DO   On: 03/30/2021 16:51   MR ANGIO NECK W WO CONTRAST  Result Date: 03/30/2021 CLINICAL DATA:  Neuro deficit, acute, stroke suspected. Additional history provided: Headache. Evaluate for intracranial hemorrhage. Fall 2 weeks ago with history of subdural hematoma. EXAM: MRI HEAD WITHOUT CONTRAST MRA HEAD WITHOUT CONTRAST MRA NECK WITHOUT AND WITH CONTRAST TECHNIQUE: Multiplanar, multi-echo pulse sequences of the brain and surrounding structures were acquired without intravenous contrast. Angiographic images of the Circle of Willis were acquired using MRA technique without intravenous contrast. Angiographic images of the neck were acquired using MRA technique without and with intravenous contrast. Carotid stenosis measurements (when applicable) are obtained utilizing NASCET criteria, using the distal internal carotid diameter as the denominator. CONTRAST:  39m GADAVIST GADOBUTROL 1 MMOL/ML IV SOLN COMPARISON:  Head CT performed earlier today 03/30/2021. Brain MRI 12/26/2012. FINDINGS: MRI HEAD FINDINGS Brain: Mild generalized cerebral and cerebellar atrophy. There is a subdural hematoma overlying the left cerebral hemisphere measuring up to 10 mm. The subdural hematoma is characterized by precontrast T1 hyperintensity, T2 FLAIR hyperintensity and heterogeneous SWI signal loss. This is most compatible with a subacute subdural hemorrhage. The collection also demonstrates apparent restricted diffusion (which may reflect susceptibility artifact from blood products at this site). This collection does not appear significantly changed in size as compared to the head CT performed earlier today. As before, there is mild mass  effect upon the underlying left cerebral hemisphere without midline shift. There is trace subacute subarachnoid hemorrhage overlying the left cerebral hemisphere (for instance as seen on series 15, image 31). Trace subacute subdural hemorrhage is also present along the falx and overlying the posterior right frontal lobe (for instance as seen on series 15, image 48) (series 9, image 12). Mild multifocal T2/FLAIR hyperintensity within the cerebral white matter and pons, nonspecific but compatible with chronic small vessel ischemic disease. There is no acute infarct. No evidence of an intracranial mass. Vascular: Expected proximal arterial flow voids. Skull and upper cervical spine: No focal marrow lesion. Sinuses/Orbits: Visualized orbits show no acute finding. Bilateral lens replacements. Trace scattered paranasal sinus mucosal thickening. MRA HEAD FINDINGS Anterior circulation: The intracranial internal carotid arteries are patent. The M1 middle cerebral arteries are patent. Mild/moderate stenosis within the proximal M1 segment of the right middle cerebral artery. Atherosclerotic irregularity of the M2 and more distal middle cerebral artery branches bilaterally. No M2 proximal branch occlusion or high-grade proximal stenosis is identified. The anterior cerebral arteries are patent. Motion artifact limits evaluation of the distal A2 and proximal A3 segments of the anterior cerebral arteries. There appear to be 2 anterior communicating arteries. 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 2 mm ventrally projecting vascular protrusion arising from the cavernous right ICA suspicious for additional aneurysm (series 5, image 77) (  series 1062, image 236). 2 mm inferiorly projecting vascular protrusion arising from the supraclinoid left ICA, with an appearance most suggestive of a small aneurysm (series 1062, image 194) (series  105, image 86). 1-2 mm inferiorly projecting vascular protrusion arising from the supraclinoid right ICA, which may reflect an infundibulum or small aneurysm. Posterior circulation: The intracranial vertebral arteries are patent. The basilar artery is patent. The posterior cerebral arteries are patent. Posterior communicating arteries are hypoplastic or absent bilaterally. Anatomic variants: As described MRA NECK FINDINGS Aortic arch: Standard aortic branching. The visualized aortic arch is normal in caliber. No hemodynamically significant innominate or proximal subclavian artery stenosis. Right carotid system: The common carotid and internal carotid arteries are patent within the neck without hemodynamically significant stenosis (50% or greater). Left carotid system: The common carotid and internal carotid arteries are patent within the neck without hemodynamically significant stenosis (50% or greater). Vertebral arteries: Vertebral arteries are codominant and patent within the neck with antegrade flow. No hemodynamically significant stenosis within these vessels. MRI brain findings called by telephone at the time of interpretation on 03/30/2021 at 4:50 pm to provider Dr. Maximiano Coss, who verbally acknowledged these results. IMPRESSION: MRI brain: 1. Redemonstrated subacute subdural hematoma overlying left cerebral hemisphere, measuring up to 10 mm in thickness. There is apparent restricted diffusion within the collection, which may reflect susceptibility artifact from blood products at this site. However, correlate clinically to exclude any signs or symptoms that would suggest superimposed infection/empyema. 2. Trace subacute subdural hemorrhage also present along the falx and overlying the posterior right frontal lobe. 3. Trace scattered subacute subarachnoid hemorrhage overlying the left cerebral hemisphere. 4. Mild chronic small vessel ischemic changes within the cerebral white matter and pons. 5. Mild generalized  parenchymal atrophy. MRA head: 1. Intracranial atherosclerotic disease, as described. Motion artifact limits evaluation of the anterior cerebral arteries at the A2/A3 junction. Within this limitation, no intracranial large vessel occlusion or proximal high-grade arterial stenosis is identified. 2. There appear to be 2 anterior communicating arteries. 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 3. 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 4. 2 mm aneurysm arising from the supraclinoid left ICA. 5. 1-2 mm inferiorly projecting vascular protrusion arising from the supraclinoid right ICA, which may reflect an infundibulum or small aneurysm. MRA neck: The common carotid, internal carotid and vertebral arteries are patent within the neck without hemodynamically significant stenosis. Electronically Signed   By: Kellie Simmering DO   On: 03/30/2021 16:51   MR BRAIN WO CONTRAST  Result Date: 03/30/2021 CLINICAL DATA:  Neuro deficit, acute, stroke suspected. Additional history provided: Headache. Evaluate for intracranial hemorrhage. Fall 2 weeks ago with history of subdural hematoma. EXAM: MRI HEAD WITHOUT CONTRAST MRA HEAD WITHOUT CONTRAST MRA NECK WITHOUT AND WITH CONTRAST TECHNIQUE: Multiplanar, multi-echo pulse sequences of the brain and surrounding structures were acquired without intravenous contrast. Angiographic images of the Circle of Willis were acquired using MRA technique without intravenous contrast. Angiographic images of the neck were acquired using MRA technique without and with intravenous contrast. Carotid stenosis measurements (when applicable) are obtained utilizing NASCET criteria, using the distal internal carotid diameter as the denominator. CONTRAST:  71m GADAVIST GADOBUTROL 1 MMOL/ML IV SOLN COMPARISON:  Head CT performed earlier today 03/30/2021. Brain MRI 12/26/2012. FINDINGS: MRI HEAD FINDINGS Brain: Mild generalized cerebral and  cerebellar atrophy. There is a subdural hematoma overlying the left cerebral hemisphere measuring up to 10 mm. The subdural hematoma  is characterized by precontrast T1 hyperintensity, T2 FLAIR hyperintensity and heterogeneous SWI signal loss. This is most compatible with a subacute subdural hemorrhage. The collection also demonstrates apparent restricted diffusion (which may reflect susceptibility artifact from blood products at this site). This collection does not appear significantly changed in size as compared to the head CT performed earlier today. As before, there is mild mass effect upon the underlying left cerebral hemisphere without midline shift. There is trace subacute subarachnoid hemorrhage overlying the left cerebral hemisphere (for instance as seen on series 15, image 31). Trace subacute subdural hemorrhage is also present along the falx and overlying the posterior right frontal lobe (for instance as seen on series 15, image 48) (series 9, image 12). Mild multifocal T2/FLAIR hyperintensity within the cerebral white matter and pons, nonspecific but compatible with chronic small vessel ischemic disease. There is no acute infarct. No evidence of an intracranial mass. Vascular: Expected proximal arterial flow voids. Skull and upper cervical spine: No focal marrow lesion. Sinuses/Orbits: Visualized orbits show no acute finding. Bilateral lens replacements. Trace scattered paranasal sinus mucosal thickening. MRA HEAD FINDINGS Anterior circulation: The intracranial internal carotid arteries are patent. The M1 middle cerebral arteries are patent. Mild/moderate stenosis within the proximal M1 segment of the right middle cerebral artery. Atherosclerotic irregularity of the M2 and more distal middle cerebral artery branches bilaterally. No M2 proximal branch occlusion or high-grade proximal stenosis is identified. The anterior cerebral arteries are patent. Motion artifact limits evaluation of the distal A2 and  proximal A3 segments of the anterior cerebral arteries. There appear to be 2 anterior communicating arteries. 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 2 mm ventrally projecting vascular protrusion arising from the cavernous right ICA suspicious for additional aneurysm (series 5, image 77) (series 1062, image 236). 2 mm inferiorly projecting vascular protrusion arising from the supraclinoid left ICA, with an appearance most suggestive of a small aneurysm (series 1062, image 194) (series 105, image 86). 1-2 mm inferiorly projecting vascular protrusion arising from the supraclinoid right ICA, which may reflect an infundibulum or small aneurysm. Posterior circulation: The intracranial vertebral arteries are patent. The basilar artery is patent. The posterior cerebral arteries are patent. Posterior communicating arteries are hypoplastic or absent bilaterally. Anatomic variants: As described MRA NECK FINDINGS Aortic arch: Standard aortic branching. The visualized aortic arch is normal in caliber. No hemodynamically significant innominate or proximal subclavian artery stenosis. Right carotid system: The common carotid and internal carotid arteries are patent within the neck without hemodynamically significant stenosis (50% or greater). Left carotid system: The common carotid and internal carotid arteries are patent within the neck without hemodynamically significant stenosis (50% or greater). Vertebral arteries: Vertebral arteries are codominant and patent within the neck with antegrade flow. No hemodynamically significant stenosis within these vessels. MRI brain findings called by telephone at the time of interpretation on 03/30/2021 at 4:50 pm to provider Dr. Maximiano Coss, who verbally acknowledged these results. IMPRESSION: MRI brain: 1. Redemonstrated subacute subdural hematoma overlying left cerebral hemisphere, measuring up  to 10 mm in thickness. There is apparent restricted diffusion within the collection, which may reflect susceptibility artifact from blood products at this site. However, correlate clinically to exclude any signs or symptoms that would suggest superimposed infection/empyema. 2. Trace subacute subdural hemorrhage also present along the falx and overlying the posterior right frontal lobe. 3. Trace scattered subacute subarachnoid hemorrhage overlying the left cerebral hemisphere.  4. Mild chronic small vessel ischemic changes within the cerebral white matter and pons. 5. Mild generalized parenchymal atrophy. MRA head: 1. Intracranial atherosclerotic disease, as described. Motion artifact limits evaluation of the anterior cerebral arteries at the A2/A3 junction. Within this limitation, no intracranial large vessel occlusion or proximal high-grade arterial stenosis is identified. 2. There appear to be 2 anterior communicating arteries. 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 3. 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 4. 2 mm aneurysm arising from the supraclinoid left ICA. 5. 1-2 mm inferiorly projecting vascular protrusion arising from the supraclinoid right ICA, which may reflect an infundibulum or small aneurysm. MRA neck: The common carotid, internal carotid and vertebral arteries are patent within the neck without hemodynamically significant stenosis. Electronically Signed   By: Kellie Simmering DO   On: 03/30/2021 16:51   EEG adult  Result Date: 03/31/2021 Derek Jack, MD     03/31/2021 10:25 PM Routine EEG Report Falesha Viona Gilmore Battaglia is a 87 y.o. female with a history of subacute L subdural hematoma and transient spell expressive aphasia who is undergoing an EEG to evaluate for seizures. Report: This EEG was acquired with electrodes placed according to the International 10-20 electrode system (including Fp1, Fp2, F3, F4, C3, C4, P3, P4, O1, O2, T3,  T4, T5, T6, A1, A2, Fz, Cz, Pz). The following electrodes were missing or displaced: none. The occipital dominant rhythm was 11 Hz. This activity is reactive to stimulation. Drowsiness was manifested by background fragmentation; deeper stages of sleep were not identified. There was no focal slowing. There were no interictal epileptiform discharges. There were no electrographic seizures identified. There was no abnormal response to photic stimulation or hyperventilation. Impression: This EEG was obtained while awake and drowsy and is normal.   Clinical Correlation: Normal EEGs, however, do not rule out epilepsy. Su Monks, MD [facility] [phone] If 7pm- 7am, please page neurology on call as listed in [place].   ECHOCARDIOGRAM COMPLETE BUBBLE STUDY  Result Date: 04/01/2021    ECHOCARDIOGRAM REPORT   Patient Name:   Caitlin Jenkins [facility] Date of Exam: 04/01/2021 Medical Rec #:  [id number]          Height:       64.0 in Accession #:    [id number]         Weight:       142.0 lb Date of Birth:  04/21/1932           BSA:          1.691 m Patient Age:    26 years           BP:           152/93 mmHg Patient Gender: F                  HR:           74 bpm. Exam Location:  [facility] Procedure: 2D Echo, Color Doppler, Cardiac Doppler and Saline Contrast Bubble            Study Indications:     TIA 435.9 / G45.9  History:         Patient has no prior history of Echocardiogram examinations.                  Risk Factors:Hypertension and Dyslipidemia.  Sonographer:     Sherrie Sport RDCS (AE) Referring Phys:  [id number] Patrecia Pour  STACK Diagnosing Phys: Ida Rogue MD IMPRESSIONS  1. Left ventricular ejection fraction, by estimation, is 60 to 65%. The left ventricle has normal function. The left ventricle has no regional wall motion abnormalities. There is moderate left ventricular hypertrophy. Left ventricular diastolic parameters are consistent with Grade I diastolic dysfunction (impaired relaxation).  2. Right ventricular  systolic function is normal. The right ventricular size is normal. There is normal pulmonary artery systolic pressure. The estimated right ventricular systolic pressure is 41.9 mmHg. FINDINGS  Left Ventricle: Left ventricular ejection fraction, by estimation, is 60 to 65%. The left ventricle has normal function. The left ventricle has no regional wall motion abnormalities. The left ventricular internal cavity size was normal in size. There is  moderate left ventricular hypertrophy. Left ventricular diastolic parameters are consistent with Grade I diastolic dysfunction (impaired relaxation). Right Ventricle: The right ventricular size is normal. No increase in right ventricular wall thickness. Right ventricular systolic function is normal. There is normal pulmonary artery systolic pressure. The tricuspid regurgitant velocity is 2.05 m/s, and  with an assumed right atrial pressure of 5 mmHg, the estimated right ventricular systolic pressure is 37.9 mmHg. Left Atrium: Left atrial size was normal in size. Right Atrium: Right atrial size was normal in size. Pericardium: There is no evidence of pericardial effusion. Mitral Valve: The mitral valve is normal in structure. No evidence of mitral valve regurgitation. No evidence of mitral valve stenosis. Tricuspid Valve: The tricuspid valve is normal in structure. Tricuspid valve regurgitation is mild . No evidence of tricuspid stenosis. Aortic Valve: The aortic valve is normal in structure. Aortic valve regurgitation is not visualized. Mild aortic valve sclerosis is present, with no evidence of aortic valve stenosis. Aortic valve mean gradient measures 4.0 mmHg. Aortic valve peak gradient measures 8.0 mmHg. Aortic valve area, by VTI measures 3.71 cm. Pulmonic Valve: The pulmonic valve was normal in structure. Pulmonic valve regurgitation is not visualized. No evidence of pulmonic stenosis. Aorta: The aortic root is normal in size and structure. Venous: The pulmonary veins  were not well visualized. The inferior vena cava is normal in size with greater than 50% respiratory variability, suggesting right atrial pressure of 3 mmHg. IAS/Shunts: No atrial level shunt detected by color flow Doppler. Agitated saline contrast was given intravenously to evaluate for intracardiac shunting.  LEFT VENTRICLE PLAX 2D LVIDd:         3.82 cm  Diastology LVIDs:         1.77 cm  LV e' medial:    4.13 cm/s LV PW:         1.44 cm  LV E/e' medial:  20.7 LV IVS:        0.98 cm  LV e' lateral:   3.59 cm/s LVOT diam:     2.20 cm  LV E/e' lateral: 23.8 LV SV:         103 LV SV Index:   61 LVOT Area:     3.80 cm  RIGHT VENTRICLE RV Basal diam:  2.70 cm RV S prime:     15.90 cm/s TAPSE (M-mode): 3.5 cm LEFT ATRIUM             Index       RIGHT ATRIUM           Index LA diam:        3.60 cm 2.13 cm/m  RA Area:     12.30 cm LA Vol (A2C):   52.7 ml 31.16 ml/m RA Volume:  24.60 ml  14.54 ml/m LA Vol (A4C):   61.0 ml 36.07 ml/m LA Biplane Vol: 56.6 ml 33.46 ml/m  AORTIC VALVE                   PULMONIC VALVE AV Area (Vmax):    3.21 cm    PV Vmax:        0.68 m/s AV Area (Vmean):   3.80 cm    PV Peak grad:   1.8 mmHg AV Area (VTI):     3.71 cm    RVOT Peak grad: 2 mmHg AV Vmax:           141.00 cm/s AV Vmean:          94.300 cm/s AV VTI:            0.278 m AV Peak Grad:      8.0 mmHg AV Mean Grad:      4.0 mmHg LVOT Vmax:         119.00 cm/s LVOT Vmean:        94.200 cm/s LVOT VTI:          0.271 m LVOT/AV VTI ratio: 0.97  AORTA Ao Root diam: 2.90 cm MITRAL VALVE                TRICUSPID VALVE MV Area (PHT): 3.40 cm     TR Peak grad:   16.8 mmHg MV Decel Time: 223 msec     TR Vmax:        205.00 cm/s MV E velocity: 85.30 cm/s MV A velocity: 123.00 cm/s  SHUNTS MV E/A ratio:  0.69         Systemic VTI:  0.27 m                             Systemic Diam: 2.20 cm Ida Rogue MD Electronically signed by Ida Rogue MD Signature Date/Time: 04/01/2021/12:25:46 PM    Final       Subjective: Has no  complaints.  Would like to go home.  Feels speech everything is at baseline.  Discharge Exam: Vitals:   04/01/21 0800 04/01/21 1217  BP: (!) 152/93 139/71  Pulse: 74 78  Resp: 18 18  Temp: (!) 97.4 F (36.3 C) 97.7 F (36.5 C)  SpO2: 100% 98%   Vitals:   03/31/21 2005 04/01/21 0300 04/01/21 0800 04/01/21 1217  BP: (!) 157/88 (!) 152/80 (!) 152/93 139/71  Pulse: 87 68 74 78  Resp: 16 18 18 18   Temp: 98.2 F (36.8 C) 98.2 F (36.8 C) (!) 97.4 F (36.3 C) 97.7 F (36.5 C)  TempSrc: Oral Oral Oral   SpO2: 97% 98% 100% 98%  Weight:      Height:        General: Pt is alert, awake, not in acute distress Cardiovascular: RRR, S1/S2 +, no rubs, no gallops Respiratory: CTA bilaterally, no wheezing, no rhonchi Abdominal: Soft, NT, ND, bowel sounds + Extremities: no edema Neuro exam grossly intact.  Speech clear.   The results of significant diagnostics from this hospitalization (including imaging, microbiology, ancillary and laboratory) are listed below for reference.     Microbiology: Recent Results (from the past 240 hour(s))  Resp Panel by RT-PCR (Flu A&B, Covid) Nasopharyngeal Swab     Status: None   Collection Time: 03/30/21  1:53 PM   Specimen: Nasopharyngeal Swab; Nasopharyngeal(NP) swabs in vial transport medium  Result Value Ref Range Status  SARS Coronavirus 2 by RT PCR NEGATIVE NEGATIVE Final    Comment: (NOTE) SARS-CoV-2 target nucleic acids are NOT DETECTED.  The SARS-CoV-2 RNA is generally detectable in upper respiratory specimens during the acute phase of infection. The lowest concentration of SARS-CoV-2 viral copies this assay can detect is 138 copies/mL. A negative result does not preclude SARS-Cov-2 infection and should not be used as the sole basis for treatment or other patient management decisions. A negative result may occur with  improper specimen collection/handling, submission of specimen other than nasopharyngeal swab, presence of viral  mutation(s) within the areas targeted by this assay, and inadequate number of viral copies(<138 copies/mL). A negative result must be combined with clinical observations, patient history, and epidemiological information. The expected result is Negative.  Fact Sheet for Patients:  EntrepreneurPulse.com.au  Fact Sheet for Healthcare Providers:  IncredibleEmployment.be  This test is no t yet approved or cleared by the [place] FDA and  has been authorized for detection and/or diagnosis of SARS-CoV-2 by FDA under an Emergency Use Authorization (EUA). This EUA will remain  in effect (meaning this test can be used) for the duration of the COVID-19 declaration under Section 564(b)(1) of the Act, 21 U.S.C.section 360bbb-3(b)(1), unless the authorization is terminated  or revoked sooner.       Influenza A by PCR NEGATIVE NEGATIVE Final   Influenza B by PCR NEGATIVE NEGATIVE Final    Comment: (NOTE) The Xpert Xpress SARS-CoV-2/FLU/RSV plus assay is intended as an aid in the diagnosis of influenza from Nasopharyngeal swab specimens and should not be used as a sole basis for treatment. Nasal washings and aspirates are unacceptable for Xpert Xpress SARS-CoV-2/FLU/RSV testing.  Fact Sheet for Patients: EntrepreneurPulse.com.au  Fact Sheet for Healthcare Providers: IncredibleEmployment.be  This test is not yet approved or cleared by the [place] FDA and has been authorized for detection and/or diagnosis of SARS-CoV-2 by FDA under an Emergency Use Authorization (EUA). This EUA will remain in effect (meaning this test can be used) for the duration of the COVID-19 declaration under Section 564(b)(1) of the Act, 21 U.S.C. section 360bbb-3(b)(1), unless the authorization is terminated or revoked.  Performed at [facility], [address] [place], [place] [address]      Labs: BNP (last 3  results) No results for input(s): BNP in the last 8760 hours. Basic Metabolic Panel: Recent Labs  Lab 03/30/21 1254 03/31/21 0410  NA 126* 132*  K 4.4 4.0  CL 95* 100  CO2 24 25  GLUCOSE 117* 77  BUN 19 15  CREATININE 1.15* 0.95  CALCIUM 9.9 9.8   Liver Function Tests: Recent Labs  Lab 03/30/21 1254 03/31/21 0410  AST 27 23  ALT 12 10  ALKPHOS 129* 93  BILITOT 0.9 0.9  PROT 7.7 7.0  ALBUMIN 4.4 3.8   No results for input(s): LIPASE, AMYLASE in the last 168 hours. No results for input(s): AMMONIA in the last 168 hours. CBC: Recent Labs  Lab 03/30/21 1254 03/31/21 0410  WBC 9.2 8.3  HGB 13.0 11.9*  HCT 38.4 35.2*  MCV 89.3 90.0  PLT 254 246   Cardiac Enzymes: No results for input(s): CKTOTAL, CKMB, CKMBINDEX, TROPONINI in the last 168 hours. BNP: Invalid input(s): POCBNP CBG: No results for input(s): GLUCAP in the last 168 hours. D-Dimer No results for input(s): DDIMER in the last 72 hours. Hgb A1c Recent Labs    03/31/21 0410  HGBA1C 5.4   Lipid Profile Recent Labs    03/31/21 0410  CHOL 161  HDL 72  LDLCALC 73  TRIG 79  CHOLHDL 2.2   Thyroid function studies Recent Labs    03/30/21 1254  TSH 3.411   Anemia work up Recent Labs    03/31/21 0410  VITAMINB12 1,155*   Urinalysis    Component Value Date/Time   COLORURINE STRAW (A) 03/30/2021 1407   APPEARANCEUR CLEAR (A) 03/30/2021 1407   LABSPEC 1.008 03/30/2021 1407   PHURINE 7.0 03/30/2021 1407   [place] 03/30/2021 1407   [place] 03/30/2021 1407   [place] 03/30/2021 1407   [place] 05/11/2018 1152   [place] 03/30/2021 1407   PROTEINUR NEGATIVE 03/30/2021 1407   UROBILINOGEN 0.2 05/11/2018 1152   NITRITE NEGATIVE 03/30/2021 1407   LEUKOCYTESUR TRACE (A) 03/30/2021 1407   Sepsis Labs Invalid input(s): PROCALCITONIN,  WBC,  LACTICIDVEN Microbiology Recent Results (from the past 240 hour(s))  Resp Panel by RT-PCR (Flu A&B,  Covid) Nasopharyngeal Swab     Status: None   Collection Time: 03/30/21  1:53 PM   Specimen: Nasopharyngeal Swab; Nasopharyngeal(NP) swabs in vial transport medium  Result Value Ref Range Status   SARS Coronavirus 2 by RT PCR NEGATIVE NEGATIVE Final    Comment: (NOTE) SARS-CoV-2 target nucleic acids are NOT DETECTED.  The SARS-CoV-2 RNA is generally detectable in upper respiratory specimens during the acute phase of infection. The lowest concentration of SARS-CoV-2 viral copies this assay can detect is 138 copies/mL. A negative result does not preclude SARS-Cov-2 infection and should not be used as the sole basis for treatment or other patient management decisions. A negative result may occur with  improper specimen collection/handling, submission of specimen other than nasopharyngeal swab, presence of viral mutation(s) within the areas targeted by this assay, and inadequate number of viral copies(<138 copies/mL). A negative result must be combined with clinical observations, patient history, and epidemiological information. The expected result is Negative.  Fact Sheet for Patients:  EntrepreneurPulse.com.au  Fact Sheet for Healthcare Providers:  IncredibleEmployment.be  This test is no t yet approved or cleared by the [place] FDA and  has been authorized for detection and/or diagnosis of SARS-CoV-2 by FDA under an Emergency Use Authorization (EUA). This EUA will remain  in effect (meaning this test can be used) for the duration of the COVID-19 declaration under Section 564(b)(1) of the Act, 21 U.S.C.section 360bbb-3(b)(1), unless the authorization is terminated  or revoked sooner.       Influenza A by PCR NEGATIVE NEGATIVE Final   Influenza B by PCR NEGATIVE NEGATIVE Final    Comment: (NOTE) The Xpert Xpress SARS-CoV-2/FLU/RSV plus assay is intended as an aid in the diagnosis of influenza from Nasopharyngeal swab specimens and should  not be used as a sole basis for treatment. Nasal washings and aspirates are unacceptable for Xpert Xpress SARS-CoV-2/FLU/RSV testing.  Fact Sheet for Patients: EntrepreneurPulse.com.au  Fact Sheet for Healthcare Providers: IncredibleEmployment.be  This test is not yet approved or cleared by the [place] FDA and has been authorized for detection and/or diagnosis of SARS-CoV-2 by FDA under an Emergency Use Authorization (EUA). This EUA will remain in effect (meaning this test can be used) for the duration of the COVID-19 declaration under Section 564(b)(1) of the Act, 21 U.S.C. section 360bbb-3(b)(1), unless the authorization is terminated or revoked.  Performed at [facility], [address], [place],  [address]      Time coordinating discharge: Over 30 minutes  SIGNED:   Nolberto Hanlon, MD  [facility] 04/01/2021,  12:50 PM Pager   If 7PM-7AM, please contact night-coverage [email or web address] Password [email or web address]

## 2021-04-01 NOTE — TOC Progression Note (Signed)
Transition of Care [employer]) - Progression Note    Patient Details  Name: Caitlin Jenkins MRN: [id number] Date of Birth: [birth year]  Transition of Care [facility]) CM/SW [place], RN Phone Number: 04/01/2021, 2:07 PM  Clinical Narrative:   Patient lives at home, daughters are staying with her.  She states she has no concerns about getting to appointments and getting/taking medications.  She does not currentlyhave home health services.  [facility] will accept patient for PT services.           Expected Discharge Plan and Services           Expected Discharge Date: 04/01/21                                     Social Determinants of Health (SDOH) Interventions    Readmission Risk Interventions No flowsheet data found.

## 2021-04-02 ENCOUNTER — Telehealth: Payer: Self-pay | Admitting: *Deleted

## 2021-04-02 ENCOUNTER — Encounter: Payer: PPO | Admitting: *Deleted

## 2021-04-02 ENCOUNTER — Telehealth: Payer: Self-pay

## 2021-04-02 NOTE — Telephone Encounter (Signed)
Transition Care Management Follow-up Telephone Call Date of discharge and from where: 04/01/21 [facility] How have you been since you were released from the hospital? Pt states she is feeling better Any questions or concerns? No  Items Reviewed: Did the pt receive and understand the discharge instructions provided? Yes  Medications obtained and verified? Yes  Other? No  Any new allergies since your discharge? No  Dietary orders reviewed? Yes Do you have support at home? Yes   Home Care and Equipment/Supplies: Were home health services ordered? yes If so, what is the name of the agency? [facility] for physical therapy  Has the agency set up a time to come to the patient's home? no Were any new equipment or medical supplies ordered?  No  Functional Questionnaire: (I = Independent and D = Dependent) ADLs: I with assistance   Bathing/Dressing- I with assistance   Meal Prep- I  Eating- I  Maintaining continence- I  Transferring/Ambulation- I with cane  Managing Meds- I  Follow up appointments reviewed:  PCP Hospital f/u appt confirmed? Yes  Scheduled to see Delsa Grana PAC on 04/12/21 @ 10:40. [place] [facility] f/u appt confirmed? Yes  Scheduled to see Dr. Manuella Ghazi - neurology on 04/09/21. Are transportation arrangements needed? No  If their condition worsens, is the pt aware to call PCP or go to the Emergency Dept.? Yes Was the patient provided with contact information for the PCP's office or ED? Yes Was to pt encouraged to call back with questions or concerns? Yes

## 2021-04-02 NOTE — Progress Notes (Signed)
This encounter was created in error - please disregard.

## 2021-04-02 NOTE — Telephone Encounter (Signed)
   04/02/2021  Caitlin Jenkins January 08, 1932 [id number]   Phone call to patient to discuss community resource needs. This Education officer, museum spoke with patient's daughter listed on the [employer] Release form to discuss patient's needs. Per patient's daughter Shauna Hugh, patient's needs have been met. Patient recently discharged from the hospital and was set up with [facility] which was patient and daughter's original request. SDOH completed, no needs identified at this time.  Patient's daughter encouraged to call if any additional community resource needs arise in the future.   Elliot Gurney, [place] Administrator, [facility] [phone]

## 2021-04-05 ENCOUNTER — Encounter: Payer: PPO | Admitting: Physical Therapy

## 2021-04-05 NOTE — Chronic Care Management (AMB) (Signed)
  Chronic Care Management      Name: [name] MRN: [id number] DOB: [birth year]  Primary Care Provider: Delsa Grana, PA-C Reason for referral : Chronic Care Management   Caitlin Jenkins is a 87 y.o. year old female who is a primary care patient of Delsa Grana, [place]. The CCM team was consulted for assistance with chronic disease management and care coordination needs.  Review of patient status, including review of consultants reports, relevant laboratory and other test results, and collaboration with appropriate care team members was performed as part of comprehensive patient evaluation and provision of chronic care management services.    Unable to complete initial telephonic outreach as scheduled due to patient being hospitalized.    PLAN Follow up pending discharge disposition.    Cristy [facility] [phone]

## 2021-04-07 DIAGNOSIS — K449 Diaphragmatic hernia without obstruction or gangrene: Secondary | ICD-10-CM | POA: Diagnosis not present

## 2021-04-07 DIAGNOSIS — I129 Hypertensive chronic kidney disease with stage 1 through stage 4 chronic kidney disease, or unspecified chronic kidney disease: Secondary | ICD-10-CM | POA: Diagnosis not present

## 2021-04-07 DIAGNOSIS — R4701 Aphasia: Secondary | ICD-10-CM | POA: Diagnosis not present

## 2021-04-07 DIAGNOSIS — E039 Hypothyroidism, unspecified: Secondary | ICD-10-CM | POA: Diagnosis not present

## 2021-04-07 DIAGNOSIS — Z9181 History of falling: Secondary | ICD-10-CM | POA: Diagnosis not present

## 2021-04-07 DIAGNOSIS — N2581 Secondary hyperparathyroidism of renal origin: Secondary | ICD-10-CM | POA: Diagnosis not present

## 2021-04-07 DIAGNOSIS — N1832 Chronic kidney disease, stage 3b: Secondary | ICD-10-CM | POA: Diagnosis not present

## 2021-04-07 DIAGNOSIS — I2510 Atherosclerotic heart disease of native coronary artery without angina pectoris: Secondary | ICD-10-CM | POA: Diagnosis not present

## 2021-04-07 DIAGNOSIS — E785 Hyperlipidemia, unspecified: Secondary | ICD-10-CM | POA: Diagnosis not present

## 2021-04-07 DIAGNOSIS — K743 Primary biliary cirrhosis: Secondary | ICD-10-CM | POA: Diagnosis not present

## 2021-04-07 DIAGNOSIS — Z8673 Personal history of transient ischemic attack (TIA), and cerebral infarction without residual deficits: Secondary | ICD-10-CM | POA: Diagnosis not present

## 2021-04-07 DIAGNOSIS — S065X0D Traumatic subdural hemorrhage without loss of consciousness, subsequent encounter: Secondary | ICD-10-CM | POA: Diagnosis not present

## 2021-04-07 DIAGNOSIS — E871 Hypo-osmolality and hyponatremia: Secondary | ICD-10-CM | POA: Diagnosis not present

## 2021-04-07 DIAGNOSIS — E440 Moderate protein-calorie malnutrition: Secondary | ICD-10-CM | POA: Diagnosis not present

## 2021-04-07 DIAGNOSIS — Z85828 Personal history of other malignant neoplasm of skin: Secondary | ICD-10-CM | POA: Diagnosis not present

## 2021-04-07 DIAGNOSIS — M109 Gout, unspecified: Secondary | ICD-10-CM | POA: Diagnosis not present

## 2021-04-07 DIAGNOSIS — H9190 Unspecified hearing loss, unspecified ear: Secondary | ICD-10-CM | POA: Diagnosis not present

## 2021-04-07 DIAGNOSIS — E663 Overweight: Secondary | ICD-10-CM | POA: Diagnosis not present

## 2021-04-07 DIAGNOSIS — Z6824 Body mass index (BMI) 24.0-24.9, adult: Secondary | ICD-10-CM | POA: Diagnosis not present

## 2021-04-07 DIAGNOSIS — K8309 Other cholangitis: Secondary | ICD-10-CM | POA: Diagnosis not present

## 2021-04-08 ENCOUNTER — Telehealth: Payer: Self-pay | Admitting: Family Medicine

## 2021-04-08 NOTE — Telephone Encounter (Signed)
VO given.

## 2021-04-08 NOTE — Telephone Encounter (Signed)
Home Health Verbal Orders - Caller/Agency: Dorion from [employer] Number: [phone] Requesting OT/PT/Skilled Nursing/Social Work/Speech Therapy: PT  Frequency: 1w8  Walking, Strengthening, balance, endurance

## 2021-04-09 ENCOUNTER — Encounter: Payer: PPO | Admitting: Physical Therapy

## 2021-04-09 DIAGNOSIS — S065X9A Traumatic subdural hemorrhage with loss of consciousness of unspecified duration, initial encounter: Secondary | ICD-10-CM | POA: Diagnosis not present

## 2021-04-12 ENCOUNTER — Encounter: Payer: PPO | Admitting: Physical Therapy

## 2021-04-12 ENCOUNTER — Other Ambulatory Visit: Payer: Self-pay

## 2021-04-12 ENCOUNTER — Ambulatory Visit (INDEPENDENT_AMBULATORY_CARE_PROVIDER_SITE_OTHER): Payer: PPO | Admitting: Family Medicine

## 2021-04-12 ENCOUNTER — Encounter: Payer: Self-pay | Admitting: Family Medicine

## 2021-04-12 VITALS — BP 124/80 | HR 77 | Temp 98.0°F | Resp 16 | Ht 64.0 in | Wt 147.3 lb

## 2021-04-12 DIAGNOSIS — R2990 Unspecified symptoms and signs involving the nervous system: Secondary | ICD-10-CM | POA: Diagnosis not present

## 2021-04-12 DIAGNOSIS — I10 Essential (primary) hypertension: Secondary | ICD-10-CM | POA: Diagnosis not present

## 2021-04-12 DIAGNOSIS — E871 Hypo-osmolality and hyponatremia: Secondary | ICD-10-CM

## 2021-04-12 LAB — BASIC METABOLIC PANEL
BUN/Creatinine Ratio: 16 (calc) (ref 6–22)
BUN: 20 mg/dL (ref 7–25)
CO2: 25 mmol/L (ref 20–32)
Calcium: 10.3 mg/dL (ref 8.6–10.4)
Chloride: 96 mmol/L — ABNORMAL LOW (ref 98–110)
Creat: 1.26 mg/dL — ABNORMAL HIGH (ref 0.60–0.95)
Glucose, Bld: 88 mg/dL (ref 65–99)
Potassium: 5 mmol/L (ref 3.5–5.3)
Sodium: 132 mmol/L — ABNORMAL LOW (ref 135–146)

## 2021-04-12 NOTE — Assessment & Plan Note (Signed)
Doing well since discharge, also remains seizure-free from prior hematoma and without new sx, not on keppra. Neuro exam wnl today. Continue to follow with neurology and neurosurgery as scheduled.

## 2021-04-12 NOTE — Patient Instructions (Signed)
It was great to see you!  Our plans for today:  - Keep your follow up with Neurosurgery. - Keep an eye on your blood pressure. Take your blood pressure if you are feeling lightheaded or dizzy and let us know if the top number is 110 or less.   We are checking some labs today, we will release these results to your MyChart.  Take care and seek immediate care sooner if you develop any concerns.   Dr. Ky Barban

## 2021-04-12 NOTE — Progress Notes (Signed)
   SUBJECTIVE:   CHIEF COMPLAINT / HPI:   HOSPITAL FOLLOW UP Time since discharge: 11 days Hospital/facility: [facility] 7/5-04/01/21 Diagnosis: expressive aphasia 2/2 TIA vs seizure, known h/o subdural hematoma  Procedures/tests:  - MRI brain NAICA - rEEG wnl - ECHO mild LVH, normal EF Consultants:  - neuro, neurosurgery New medications:  - keppra 500mg  BID 6/19-6/25 for seizure ppx - increased amlodipine. Discharge instructions:   Status: better - Saturday night had brief 5 min episode of speech mumbled, self resolved. - confusion improved. Stayed by herself last night. Not driving yet. - has CT Head scheduled for tomorrow, neurosurgery appt 7/21. - SBP 116-154 at home    OBJECTIVE:   BP 124/80   Pulse 77   Temp 98 F (36.7 C)   Resp 16   Ht 5\' 4"  (1.626 m)   Wt 147 lb 4.8 oz (66.8 kg)   SpO2 98%   BMI 25.28 kg/m   Gen: well appearing, in NAD MSK: strength 5/5 to U/LE bilaterally, normal gait.  No edema.  Neuro: Alert and oriented, speech normal.  Optic field normal. Extraocular movements intact.  Intact symmetric sensation to light touch of face and extremities bilaterally. Hard of hearing at baseline, hearing aids in place. Tongue protrudes normally with no deviation.  Shoulder shrug, smile symmetric.   ASSESSMENT/PLAN:   Essential hypertension, benign Doing well since dose increase at hospitalization. Continue to monitor at home, also measure if experiences orthostatic sx. F/u in 3 months.  Stroke-like symptoms Doing well since discharge, also remains seizure-free from prior hematoma and without new sx, not on keppra. Neuro exam wnl today. Continue to follow with neurology and neurosurgery as scheduled.  Hyponatremia Obtaining BMP.   Myles Gip, DO

## 2021-04-12 NOTE — Assessment & Plan Note (Signed)
Doing well since dose increase at hospitalization. Continue to monitor at home, also measure if experiences orthostatic sx. F/u in 3 months.

## 2021-04-13 ENCOUNTER — Ambulatory Visit
Admission: RE | Admit: 2021-04-13 | Discharge: 2021-04-13 | Disposition: A | Discharge status: Home / Self Care | Payer: PPO | Source: Ambulatory Visit | Attending: Neurosurgery | Admitting: Neurosurgery

## 2021-04-13 DIAGNOSIS — R220 Localized swelling, mass and lump, head: Secondary | ICD-10-CM | POA: Diagnosis not present

## 2021-04-13 DIAGNOSIS — I6200 Nontraumatic subdural hemorrhage, unspecified: Secondary | ICD-10-CM | POA: Diagnosis not present

## 2021-04-13 DIAGNOSIS — S065X9A Traumatic subdural hemorrhage with loss of consciousness of unspecified duration, initial encounter: Secondary | ICD-10-CM | POA: Insufficient documentation

## 2021-04-13 DIAGNOSIS — Z87828 Personal history of other (healed) physical injury and trauma: Secondary | ICD-10-CM | POA: Diagnosis not present

## 2021-04-13 DIAGNOSIS — S065XAA Traumatic subdural hemorrhage with loss of consciousness status unknown, initial encounter: Secondary | ICD-10-CM

## 2021-04-14 DIAGNOSIS — K743 Primary biliary cirrhosis: Secondary | ICD-10-CM | POA: Diagnosis not present

## 2021-04-14 DIAGNOSIS — Z6824 Body mass index (BMI) 24.0-24.9, adult: Secondary | ICD-10-CM | POA: Diagnosis not present

## 2021-04-14 DIAGNOSIS — H9190 Unspecified hearing loss, unspecified ear: Secondary | ICD-10-CM | POA: Diagnosis not present

## 2021-04-14 DIAGNOSIS — K449 Diaphragmatic hernia without obstruction or gangrene: Secondary | ICD-10-CM | POA: Diagnosis not present

## 2021-04-14 DIAGNOSIS — M109 Gout, unspecified: Secondary | ICD-10-CM | POA: Diagnosis not present

## 2021-04-14 DIAGNOSIS — Z9181 History of falling: Secondary | ICD-10-CM | POA: Diagnosis not present

## 2021-04-14 DIAGNOSIS — Z8673 Personal history of transient ischemic attack (TIA), and cerebral infarction without residual deficits: Secondary | ICD-10-CM | POA: Diagnosis not present

## 2021-04-14 DIAGNOSIS — E440 Moderate protein-calorie malnutrition: Secondary | ICD-10-CM | POA: Diagnosis not present

## 2021-04-14 DIAGNOSIS — Z85828 Personal history of other malignant neoplasm of skin: Secondary | ICD-10-CM | POA: Diagnosis not present

## 2021-04-14 DIAGNOSIS — K8309 Other cholangitis: Secondary | ICD-10-CM | POA: Diagnosis not present

## 2021-04-14 DIAGNOSIS — E871 Hypo-osmolality and hyponatremia: Secondary | ICD-10-CM | POA: Diagnosis not present

## 2021-04-14 DIAGNOSIS — E663 Overweight: Secondary | ICD-10-CM | POA: Diagnosis not present

## 2021-04-14 DIAGNOSIS — S065X0D Traumatic subdural hemorrhage without loss of consciousness, subsequent encounter: Secondary | ICD-10-CM | POA: Diagnosis not present

## 2021-04-14 DIAGNOSIS — R4701 Aphasia: Secondary | ICD-10-CM | POA: Diagnosis not present

## 2021-04-14 DIAGNOSIS — I129 Hypertensive chronic kidney disease with stage 1 through stage 4 chronic kidney disease, or unspecified chronic kidney disease: Secondary | ICD-10-CM | POA: Diagnosis not present

## 2021-04-14 DIAGNOSIS — N2581 Secondary hyperparathyroidism of renal origin: Secondary | ICD-10-CM | POA: Diagnosis not present

## 2021-04-14 DIAGNOSIS — I2510 Atherosclerotic heart disease of native coronary artery without angina pectoris: Secondary | ICD-10-CM | POA: Diagnosis not present

## 2021-04-14 DIAGNOSIS — E039 Hypothyroidism, unspecified: Secondary | ICD-10-CM | POA: Diagnosis not present

## 2021-04-14 DIAGNOSIS — E785 Hyperlipidemia, unspecified: Secondary | ICD-10-CM | POA: Diagnosis not present

## 2021-04-14 DIAGNOSIS — N1832 Chronic kidney disease, stage 3b: Secondary | ICD-10-CM | POA: Diagnosis not present

## 2021-04-15 DIAGNOSIS — S065X9A Traumatic subdural hemorrhage with loss of consciousness of unspecified duration, initial encounter: Secondary | ICD-10-CM | POA: Diagnosis not present

## 2021-04-16 ENCOUNTER — Encounter: Payer: PPO | Admitting: Physical Therapy

## 2021-04-19 ENCOUNTER — Encounter: Payer: PPO | Admitting: Physical Therapy

## 2021-04-21 ENCOUNTER — Ambulatory Visit: Payer: PPO

## 2021-04-21 DIAGNOSIS — E782 Mixed hyperlipidemia: Secondary | ICD-10-CM

## 2021-04-21 DIAGNOSIS — I10 Essential (primary) hypertension: Secondary | ICD-10-CM

## 2021-04-21 NOTE — Chronic Care Management (AMB) (Signed)
Chronic Care Management   CCM RN Visit Note  04/21/2021 Name: Caitlin Jenkins MRN: [id number] DOB: [birth year]  Subjective: Caitlin Jenkins is a 87 y.o. year old female who is a primary care patient of Caitlin Jenkins, [place]. The care management team was consulted for assistance with disease management and care coordination needs.    Engaged with patient by telephone for initial visit in response to provider referral for case management and/or care coordination services.   Consent to Services:  The patient was given the following information about Chronic Care Management services , agreed to services, and gave verbal consent: 1. CCM service includes personalized support from designated clinical staff supervised by the primary care provider, including individualized plan of care and coordination with other care providers 2. 24/7 contact phone numbers for assistance for urgent and routine care needs. 3. Service will only be billed when office clinical staff spend 20 minutes or more in a month to coordinate care. 4. Only one practitioner may furnish and bill the service in a calendar month. 5.The patient may stop CCM services at any time (effective at the end of the month) by phone call to the office staff. 6. The patient will be responsible for cost sharing (co-pay) of up to 20% of the service fee (after annual deductible is met). Patient reports having needed resources. Declined need for ongoing outreach.   Assessment: Review of patient past medical history, allergies, medications, health status, including review of consultants reports, laboratory and other test data, was performed as part of comprehensive evaluation and provision of chronic care management services.   SDOH (Social Determinants of Health) assessments and interventions performed:  No    CCM Care Plan  Allergies  Allergen Reactions   Indomethacin Hives   Levofloxacin In D5w Other (See Comments)    Tendinopathy and myalgia    Ace Inhibitors Swelling    Other reaction(s): SWELLING Facial swelling. Facial swelling.   Augmentin [Amoxicillin-Pot Clavulanate] Other (See Comments)    Other reaction(s): Unknown   Elemental Sulfur Other (See Comments)   Penicillins Nausea And Vomiting    Has patient had a PCN reaction causing immediate rash, facial/tongue/throat swelling, SOB or lightheadedness with hypotension: Yes Has patient had a PCN reaction causing severe rash involving mucus membranes or skin necrosis: No Has patient had a PCN reaction that required hospitalization: No Has patient had a PCN reaction occurring within the last 10 years: No If all of the above answers are "NO", then may proceed with Cephalosporin use.   Sulfa Antibiotics Swelling and Other (See Comments)    Outpatient Encounter Medications as of 04/21/2021  Medication Sig Note   acetaminophen (TYLENOL) 325 MG tablet Take 650 mg by mouth 2 (two) times daily.    amLODipine (NORVASC) 5 MG tablet Take 1 tablet (5 mg total) by mouth daily. 04/21/2021: Reports taking 2.37m as advised by provider.   Ascorbic Acid (VITAMIN C) 1000 MG tablet Take 1,000 mg by mouth 2 (two) times a day.    atorvastatin (LIPITOR) 10 MG tablet Take 1 tablet (10 mg total) by mouth at bedtime. (do not take for 3 days after taking colchicine) 03/30/2021: Patient only takes as needed   Cholecalciferol (VITAMIN D-3) 25 MCG (1000 UT) CAPS Take 1 capsule by mouth daily.    COLACE 100 MG capsule Take 100 mg by mouth daily.     Colchicine 0.6 MG CAPS Take 0.6 mg by mouth daily as needed. 03/02/2021: PRN only   levothyroxine (SYNTHROID) 75 MCG  tablet Take 1 tablet (75 mcg total) by mouth daily.    losartan (COZAAR) 100 MG tablet Take 1 tablet by mouth daily (Avoid salt substitutes, no fruit smoothies.)    meclizine (ANTIVERT) 25 MG tablet Take 0.5-1 tablets (12.5-25 mg total) by mouth 3 (three) times daily as needed for dizziness.    niacinamide 500 MG tablet Take 1 tablet (500 mg total) by  mouth 2 (two) times daily with a meal.    pantoprazole (PROTONIX) 40 MG tablet Take 1 tablet (40 mg total) by mouth at bedtime.    Probiotic Product (PROBIOTIC DAILY PO) Take by mouth.    thiamine 100 MG tablet Take 100 mg by mouth daily.    Turmeric 450 MG CAPS Take 500 mg by mouth daily.     ursodiol (ACTIGALL) 300 MG capsule Take 1 capsule (300 mg total) by mouth daily.    vitamin B-12 (CYANOCOBALAMIN) 250 MCG tablet Take 250 mcg by mouth daily.    No facility-administered encounter medications on file as of 04/21/2021.    Patient Active Problem List   Diagnosis Date Noted   Malnutrition of moderate degree 03/31/2021   Stroke-like symptoms 03/30/2021   TIA (transient ischemic attack) 03/30/2021   Fall 03/14/2021   SDH (subdural hematoma) ([place]) 03/14/2021   Trauma 03/13/2021   History of basal cell carcinoma (BCC) 09/12/2018   Hyperparathyroidism, secondary renal ([place]) 07/11/2018   Primary biliary cholangitis ([place]) 03/20/2017   Overweight (BMI 25.0-29.9) 11/29/2015   Carotid artery disease ([place]) 10/09/2015   Nail abnormalities 10/09/2015   Arthralgia of multiple joints 10/09/2015   Essential hypertension, benign 06/19/2015   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 Confirmed start of home health services. Declined need for ongoing care management outreach.  Medications were reviewed during the call today. Declined need for assistance with medication management or prescription cost. She agreed to call if her status changes and outreach is required. The care management team will gladly assist.   Caitlin Jenkins [facility] Care Management [facility] [phone]

## 2021-04-23 ENCOUNTER — Encounter: Payer: PPO | Admitting: Physical Therapy

## 2021-04-27 ENCOUNTER — Telehealth: Payer: Self-pay

## 2021-04-27 NOTE — Telephone Encounter (Signed)
Copied from [place] [id number] [id number]. Topic: General - Other >> Apr 27, 2021 10:45 AM Leward Quan A wrote: Reason for CRM: [place] called in to clarify Rx for levothyroxine (SYNTHROID) 75 MCG tablet note on Rx states dc 100 mcg but patient was taking 88 mcg please advise and call to give verbal ok to  Ph# [phone] ref ID# [id number]

## 2021-04-28 ENCOUNTER — Telehealth: Payer: Self-pay | Admitting: Family Medicine

## 2021-04-28 DIAGNOSIS — R4701 Aphasia: Secondary | ICD-10-CM | POA: Diagnosis not present

## 2021-04-28 DIAGNOSIS — Z6824 Body mass index (BMI) 24.0-24.9, adult: Secondary | ICD-10-CM | POA: Diagnosis not present

## 2021-04-28 DIAGNOSIS — I2510 Atherosclerotic heart disease of native coronary artery without angina pectoris: Secondary | ICD-10-CM | POA: Diagnosis not present

## 2021-04-28 DIAGNOSIS — Z9181 History of falling: Secondary | ICD-10-CM | POA: Diagnosis not present

## 2021-04-28 DIAGNOSIS — K449 Diaphragmatic hernia without obstruction or gangrene: Secondary | ICD-10-CM | POA: Diagnosis not present

## 2021-04-28 DIAGNOSIS — E785 Hyperlipidemia, unspecified: Secondary | ICD-10-CM | POA: Diagnosis not present

## 2021-04-28 DIAGNOSIS — E440 Moderate protein-calorie malnutrition: Secondary | ICD-10-CM | POA: Diagnosis not present

## 2021-04-28 DIAGNOSIS — H9190 Unspecified hearing loss, unspecified ear: Secondary | ICD-10-CM | POA: Diagnosis not present

## 2021-04-28 DIAGNOSIS — I129 Hypertensive chronic kidney disease with stage 1 through stage 4 chronic kidney disease, or unspecified chronic kidney disease: Secondary | ICD-10-CM | POA: Diagnosis not present

## 2021-04-28 DIAGNOSIS — K743 Primary biliary cirrhosis: Secondary | ICD-10-CM | POA: Diagnosis not present

## 2021-04-28 DIAGNOSIS — E663 Overweight: Secondary | ICD-10-CM | POA: Diagnosis not present

## 2021-04-28 DIAGNOSIS — N1832 Chronic kidney disease, stage 3b: Secondary | ICD-10-CM | POA: Diagnosis not present

## 2021-04-28 DIAGNOSIS — K8309 Other cholangitis: Secondary | ICD-10-CM | POA: Diagnosis not present

## 2021-04-28 DIAGNOSIS — M109 Gout, unspecified: Secondary | ICD-10-CM | POA: Diagnosis not present

## 2021-04-28 DIAGNOSIS — N2581 Secondary hyperparathyroidism of renal origin: Secondary | ICD-10-CM | POA: Diagnosis not present

## 2021-04-28 DIAGNOSIS — E871 Hypo-osmolality and hyponatremia: Secondary | ICD-10-CM | POA: Diagnosis not present

## 2021-04-28 DIAGNOSIS — E039 Hypothyroidism, unspecified: Secondary | ICD-10-CM | POA: Diagnosis not present

## 2021-04-28 DIAGNOSIS — Z8673 Personal history of transient ischemic attack (TIA), and cerebral infarction without residual deficits: Secondary | ICD-10-CM | POA: Diagnosis not present

## 2021-04-28 DIAGNOSIS — S065X0D Traumatic subdural hemorrhage without loss of consciousness, subsequent encounter: Secondary | ICD-10-CM | POA: Diagnosis not present

## 2021-04-28 DIAGNOSIS — Z85828 Personal history of other malignant neoplasm of skin: Secondary | ICD-10-CM | POA: Diagnosis not present

## 2021-04-28 NOTE — Telephone Encounter (Signed)
Olivia Mackie from [facility] called about dispensing manufacturer brand [employer]  to patient from [place] patient had. Please call back.  Ref [id number]

## 2021-04-29 NOTE — Telephone Encounter (Signed)
Left detailed vm °

## 2021-04-30 ENCOUNTER — Ambulatory Visit (INDEPENDENT_AMBULATORY_CARE_PROVIDER_SITE_OTHER): Payer: PPO

## 2021-04-30 ENCOUNTER — Other Ambulatory Visit (HOSPITAL_COMMUNITY): Payer: Self-pay | Admitting: Neurosurgery

## 2021-04-30 ENCOUNTER — Other Ambulatory Visit: Payer: Self-pay | Admitting: Neurosurgery

## 2021-04-30 DIAGNOSIS — E039 Hypothyroidism, unspecified: Secondary | ICD-10-CM

## 2021-04-30 DIAGNOSIS — S065XAA Traumatic subdural hemorrhage with loss of consciousness status unknown, initial encounter: Secondary | ICD-10-CM

## 2021-04-30 DIAGNOSIS — S065X9A Traumatic subdural hemorrhage with loss of consciousness of unspecified duration, initial encounter: Secondary | ICD-10-CM

## 2021-05-03 ENCOUNTER — Telehealth: Payer: Self-pay

## 2021-05-03 NOTE — Telephone Encounter (Signed)
Copied from [place] [id number]. Topic: General - Other >> May 03, 2021  8:55 AM Leward Quan A wrote: Reason for CRM: [facility]) calledin to inform Caitlin Jenkins that the levothyroxine (SYNTHROID) 75 MCG tablet has a new manufacturer and they need a call back to dispense Ph# [phone]

## 2021-05-03 NOTE — Chronic Care Management (AMB) (Signed)
  Chronic Care Management   CCM RN Visit Note   Name: OCEANE STREHLE MRN: [id number] DOB: [birth year]  Subjective: SOLENNE COLTHARP is a 87 y.o. year old female who is a primary care patient of Delsa Grana, [place]. The care management team was consulted for assistance with disease management and care coordination needs.     Mrs. Mcalexander called regarding assistance with medication orders. Reports pharmacy is requesting clarification with orders due to availability of brands currently in stock. Pharmacy staff was attempting to forward updates to the ordering practitioner Kathrine Haddock. Contacted pharmacy and advised to route request to patient's PCP, Delsa Grana. Mrs. Lensch agreed to call if additional assistance is required.  Cristy Friedlander [facility] Care Management [facility] [phone]

## 2021-05-03 NOTE — Telephone Encounter (Signed)
FYI Can manufacturer be changed

## 2021-05-04 ENCOUNTER — Telehealth: Payer: Self-pay

## 2021-05-04 NOTE — Telephone Encounter (Signed)
Copied from [place] [id number]. Topic: General - Other >> May 04, 2021  8:50 AM Tessa Lerner A wrote: Reason for CRM: Mateo Flow with [facility] has called with questions regarding patient's levothyroxine (SYNTHROID) 75 MCG tablet medication   [facility] would like to know if it is okay to use a Levothyroxine manufactured by [employer]   Please contact [facility] further with the ok this medication change and/or discuss further Ref # [id number]   [facility] can be reached at [phone]

## 2021-05-04 NOTE — Telephone Encounter (Signed)
Left message at pharmacy

## 2021-05-24 ENCOUNTER — Ambulatory Visit
Admission: RE | Admit: 2021-05-24 | Discharge: 2021-05-24 | Disposition: A | Discharge status: Home / Self Care | Payer: PPO | Source: Ambulatory Visit | Attending: Neurosurgery | Admitting: Neurosurgery

## 2021-05-24 ENCOUNTER — Other Ambulatory Visit: Payer: Self-pay

## 2021-05-24 DIAGNOSIS — S065X9A Traumatic subdural hemorrhage with loss of consciousness of unspecified duration, initial encounter: Secondary | ICD-10-CM | POA: Diagnosis not present

## 2021-05-24 DIAGNOSIS — S065XAA Traumatic subdural hemorrhage with loss of consciousness status unknown, initial encounter: Secondary | ICD-10-CM

## 2021-05-24 DIAGNOSIS — I6200 Nontraumatic subdural hemorrhage, unspecified: Secondary | ICD-10-CM | POA: Diagnosis not present

## 2021-05-24 DIAGNOSIS — Z87828 Personal history of other (healed) physical injury and trauma: Secondary | ICD-10-CM | POA: Diagnosis not present

## 2021-05-26 DIAGNOSIS — E039 Hypothyroidism, unspecified: Secondary | ICD-10-CM | POA: Diagnosis not present

## 2021-05-27 DIAGNOSIS — S065X9A Traumatic subdural hemorrhage with loss of consciousness of unspecified duration, initial encounter: Secondary | ICD-10-CM | POA: Diagnosis not present

## 2021-07-13 ENCOUNTER — Ambulatory Visit (INDEPENDENT_AMBULATORY_CARE_PROVIDER_SITE_OTHER): Payer: PPO | Admitting: Family Medicine

## 2021-07-13 ENCOUNTER — Other Ambulatory Visit: Payer: Self-pay

## 2021-07-13 ENCOUNTER — Encounter: Payer: Self-pay | Admitting: Family Medicine

## 2021-07-13 VITALS — BP 138/72 | HR 90 | Temp 97.6°F | Resp 16 | Ht 64.0 in | Wt 149.5 lb

## 2021-07-13 DIAGNOSIS — S065XAA Traumatic subdural hemorrhage with loss of consciousness status unknown, initial encounter: Secondary | ICD-10-CM | POA: Diagnosis not present

## 2021-07-13 DIAGNOSIS — K743 Primary biliary cirrhosis: Secondary | ICD-10-CM

## 2021-07-13 DIAGNOSIS — N1832 Chronic kidney disease, stage 3b: Secondary | ICD-10-CM | POA: Diagnosis not present

## 2021-07-13 DIAGNOSIS — N2581 Secondary hyperparathyroidism of renal origin: Secondary | ICD-10-CM | POA: Diagnosis not present

## 2021-07-13 DIAGNOSIS — I10 Essential (primary) hypertension: Secondary | ICD-10-CM

## 2021-07-13 DIAGNOSIS — E890 Postprocedural hypothyroidism: Secondary | ICD-10-CM

## 2021-07-13 DIAGNOSIS — M1A0610 Idiopathic chronic gout, right knee, without tophus (tophi): Secondary | ICD-10-CM | POA: Diagnosis not present

## 2021-07-13 NOTE — Progress Notes (Signed)
    SUBJECTIVE:   CHIEF COMPLAINT / HPI:   Hypertension: - Medications: losartan, amlodipine,  - Compliance: good - Checking BP at home: yes, SBP 99-140 - Denies any LE edema, medication Ses - some orthostatic symptoms with lower BP. Not often.   Subdural hematoma - follows with neuro, neurosurg, last visit 05/27/21 - at that time cognition had returned to baseline with interval resolution of hematoma on 8/29 CT Head. Has appt tomorrow.  - still having trouble with memory - back to driving - no troubles talking - back to regular self other than memory.  - no seizures.  - lives alone.   Gout - colchicine prn - no flares in about a year - toe, knee, leg, hip  Hepatic cirrhosis - 2/2 primary biliary cholangitis on ursodeoxycholic acid - follows with GI, Dr. Allen Norris, last visit 06/13 - no jaundice, swelling  Hypothyroidism - Medications: Synthroid 70mcg - Current symptoms:  change in energy level - Denies diarrhea, palpitations, and weight changes   OBJECTIVE:   BP 138/72   Pulse 90   Temp 97.6 F (36.4 C) (Oral)   Resp 16   Ht 5\' 4"  (1.626 m)   Wt 149 lb 8 oz (67.8 kg)   SpO2 99%   BMI 25.66 kg/m   Gen: well appearing, in NAD Card: RRR Lungs: CTAB Ext: WWP, no edema   ASSESSMENT/PLAN:   Essential hypertension, benign At goal for age, no changes.  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 Recheck labs today. Continue to follow with GI.  Hypothyroidism Recheck labs.   SDH (subdural hematoma) Resolved on f/u imaging. Continue to follow with neurology.  CKD (chronic kidney disease) stage 3, GFR 30-59 ml/min ([place]) Recheck labs today. Has upcoming nephro appt.   Gout Doing well with no recent flares. Continue to monitor.     Myles Gip, DO

## 2021-07-13 NOTE — Patient Instructions (Signed)
It was great to see you!  Our plans for today:  - No changes to your medications.  - We are checking some labs today, we will release these results to your MyChart.  Take care and seek immediate care sooner if you develop any concerns.   Dr. Raynor Calcaterra  

## 2021-07-13 NOTE — Assessment & Plan Note (Signed)
Doing well with no recent flares. Continue to monitor.

## 2021-07-13 NOTE — Assessment & Plan Note (Signed)
Recheck labs 

## 2021-07-13 NOTE — Assessment & Plan Note (Signed)
Recheck labs today. Has upcoming nephro appt.

## 2021-07-13 NOTE — Assessment & Plan Note (Signed)
At goal for age, no changes.  

## 2021-07-13 NOTE — Assessment & Plan Note (Signed)
Resolved on f/u imaging. Continue to follow with neurology.

## 2021-07-13 NOTE — Assessment & Plan Note (Addendum)
Currently asymptomatic, doing well on ursodeoxycholic acid. Recheck labs today. Continue to follow with GI.

## 2021-07-14 DIAGNOSIS — Z8679 Personal history of other diseases of the circulatory system: Secondary | ICD-10-CM | POA: Diagnosis not present

## 2021-07-14 DIAGNOSIS — L570 Actinic keratosis: Secondary | ICD-10-CM | POA: Diagnosis not present

## 2021-07-14 DIAGNOSIS — D2261 Melanocytic nevi of right upper limb, including shoulder: Secondary | ICD-10-CM | POA: Diagnosis not present

## 2021-07-14 DIAGNOSIS — Z85828 Personal history of other malignant neoplasm of skin: Secondary | ICD-10-CM | POA: Diagnosis not present

## 2021-07-14 DIAGNOSIS — D2272 Melanocytic nevi of left lower limb, including hip: Secondary | ICD-10-CM | POA: Diagnosis not present

## 2021-07-14 DIAGNOSIS — D2262 Melanocytic nevi of left upper limb, including shoulder: Secondary | ICD-10-CM | POA: Diagnosis not present

## 2021-07-14 DIAGNOSIS — X32XXXA Exposure to sunlight, initial encounter: Secondary | ICD-10-CM | POA: Diagnosis not present

## 2021-07-14 DIAGNOSIS — D225 Melanocytic nevi of trunk: Secondary | ICD-10-CM | POA: Diagnosis not present

## 2021-07-14 DIAGNOSIS — C44319 Basal cell carcinoma of skin of other parts of face: Secondary | ICD-10-CM | POA: Diagnosis not present

## 2021-07-14 DIAGNOSIS — D485 Neoplasm of uncertain behavior of skin: Secondary | ICD-10-CM | POA: Diagnosis not present

## 2021-07-14 LAB — COMPREHENSIVE METABOLIC PANEL WITH GFR
AG Ratio: 1.6 (calc) (ref 1.0–2.5)
ALT: 8 U/L (ref 6–29)
AST: 21 U/L (ref 10–35)
Albumin: 4.4 g/dL (ref 3.6–5.1)
Alkaline phosphatase (APISO): 93 U/L (ref 37–153)
BUN/Creatinine Ratio: 17 (calc) (ref 6–22)
BUN: 25 mg/dL (ref 7–25)
CO2: 24 mmol/L (ref 20–32)
Calcium: 10.2 mg/dL (ref 8.6–10.4)
Chloride: 98 mmol/L (ref 98–110)
Creat: 1.46 mg/dL — ABNORMAL HIGH (ref 0.60–0.95)
Globulin: 2.8 g/dL (ref 1.9–3.7)
Glucose, Bld: 86 mg/dL (ref 65–139)
Potassium: 4.6 mmol/L (ref 3.5–5.3)
Sodium: 132 mmol/L — ABNORMAL LOW (ref 135–146)
Total Bilirubin: 0.4 mg/dL (ref 0.2–1.2)
Total Protein: 7.2 g/dL (ref 6.1–8.1)

## 2021-07-14 LAB — TSH: TSH: 2.64 m[IU]/L (ref 0.40–4.50)

## 2021-07-16 ENCOUNTER — Telehealth: Payer: Self-pay

## 2021-07-16 NOTE — Telephone Encounter (Signed)
Pt was notified about her results.

## 2021-07-26 DIAGNOSIS — E871 Hypo-osmolality and hyponatremia: Secondary | ICD-10-CM | POA: Diagnosis not present

## 2021-07-26 DIAGNOSIS — N2581 Secondary hyperparathyroidism of renal origin: Secondary | ICD-10-CM | POA: Diagnosis not present

## 2021-07-26 DIAGNOSIS — I129 Hypertensive chronic kidney disease with stage 1 through stage 4 chronic kidney disease, or unspecified chronic kidney disease: Secondary | ICD-10-CM | POA: Diagnosis not present

## 2021-07-26 DIAGNOSIS — N1832 Chronic kidney disease, stage 3b: Secondary | ICD-10-CM | POA: Diagnosis not present

## 2021-08-16 ENCOUNTER — Ambulatory Visit: Payer: Self-pay

## 2021-08-16 DIAGNOSIS — E039 Hypothyroidism, unspecified: Secondary | ICD-10-CM

## 2021-08-23 DIAGNOSIS — Z8673 Personal history of transient ischemic attack (TIA), and cerebral infarction without residual deficits: Secondary | ICD-10-CM | POA: Diagnosis not present

## 2021-08-23 DIAGNOSIS — I10 Essential (primary) hypertension: Secondary | ICD-10-CM | POA: Diagnosis not present

## 2021-08-23 DIAGNOSIS — F0390 Unspecified dementia without behavioral disturbance: Secondary | ICD-10-CM | POA: Diagnosis not present

## 2021-08-23 NOTE — Chronic Care Management (AMB) (Signed)
  Chronic Care Management   CCM RN Visit Note   Name: Caitlin Jenkins MRN: [id number] DOB: [birth year]  Subjective: Caitlin Jenkins is a 87 y.o. year old female who is a primary care patient of Delsa Grana, [place]. The care management team was consulted for assistance with disease management and care coordination needs.     Outpatient Encounter Medications as of 08/16/2021  Medication Sig Note   acetaminophen (TYLENOL) 325 MG tablet Take 650 mg by mouth 2 (two) times daily.    amLODipine (NORVASC) 5 MG tablet Take 1 tablet (5 mg total) by mouth daily. 04/21/2021: Reports taking 2.5mg  as advised by provider.   Ascorbic Acid (VITAMIN C) 1000 MG tablet Take 1,000 mg by mouth 2 (two) times a day.    atorvastatin (LIPITOR) 10 MG tablet Take 1 tablet (10 mg total) by mouth at bedtime. (do not take for 3 days after taking colchicine) 03/30/2021: Patient only takes as needed   Cholecalciferol (VITAMIN D-3) 25 MCG (1000 UT) CAPS Take 1 capsule by mouth daily.    COLACE 100 MG capsule Take 100 mg by mouth daily.     Colchicine 0.6 MG CAPS Take 0.6 mg by mouth daily as needed. 03/02/2021: PRN only   levothyroxine (SYNTHROID) 75 MCG tablet Take 1 tablet (75 mcg total) by mouth daily.    losartan (COZAAR) 100 MG tablet Take 1 tablet by mouth daily (Avoid salt substitutes, no fruit smoothies.)    meclizine (ANTIVERT) 25 MG tablet Take 0.5-1 tablets (12.5-25 mg total) by mouth 3 (three) times daily as needed for dizziness.    niacinamide 500 MG tablet Take 1 tablet (500 mg total) by mouth 2 (two) times daily with a meal.    pantoprazole (PROTONIX) 40 MG tablet Take 1 tablet (40 mg total) by mouth at bedtime.    Probiotic Product (PROBIOTIC DAILY PO) Take by mouth.    thiamine 100 MG tablet Take 100 mg by mouth daily.    Turmeric 450 MG CAPS Take 500 mg by mouth daily.     ursodiol (ACTIGALL) 300 MG capsule Take 1 capsule (300 mg total) by mouth daily.    vitamin A 3 MG (10000 UNITS) capsule Take 300 Units  by mouth daily.    vitamin B-12 (CYANOCOBALAMIN) 250 MCG tablet Take 250 mcg by mouth daily.    No facility-administered encounter medications on file as of 08/16/2021.    Mrs. Hoke called to request assistance with relaying a message to her primary care provider regarding medications. [facility] reports making multiple attempts to obtain a new order for levothyroxine 75 mcg. Mrs. Pereida reports currently having enough medication but will run out if the prescription can not be obtained with the next mail out.   Message forwarded to patient's primary care provider.  PLAN Mrs. Terhaar will contact the care management team if additional assistance is required.   Cristy Friedlander [facility] Care Management [facility] [phone]

## 2021-08-27 ENCOUNTER — Other Ambulatory Visit: Payer: Self-pay

## 2021-08-27 ENCOUNTER — Ambulatory Visit
Admission: EM | Admit: 2021-08-27 | Discharge: 2021-08-27 | Disposition: A | Discharge status: Home / Self Care | Payer: PPO | Attending: Student | Admitting: Student

## 2021-08-27 DIAGNOSIS — S81811A Laceration without foreign body, right lower leg, initial encounter: Secondary | ICD-10-CM

## 2021-08-27 MED ORDER — TETANUS-DIPHTH-ACELL PERTUSSIS 5-2.5-18.5 LF-MCG/0.5 IM SUSY
0.5000 mL | PREFILLED_SYRINGE | Freq: Once | INTRAMUSCULAR | Status: AC
  Administered 2021-08-27: 0.5 mL via INTRAMUSCULAR

## 2021-08-27 MED ORDER — DOXYCYCLINE HYCLATE 100 MG PO CAPS: 100.0000 mg | ORAL_CAPSULE | Freq: Two times a day (BID) | ORAL | 0 refills | Status: DC

## 2021-08-27 NOTE — Discharge Instructions (Signed)
-  Keep skin dry and intact. -Perform daily dressing changes apply Neosporin over the laceration site. -Keep leg covered with Ace wrap. -Follow-up in 10 days for suture removal.

## 2021-08-27 NOTE — ED Triage Notes (Signed)
Pt here with C/O right lower leg laceration. States friend told her to get stitches. Unknown TD.

## 2021-08-27 NOTE — ED Provider Notes (Signed)
[facility]    CSN: [id number] Arrival date & time: 08/27/21  1714      History   Chief Complaint Chief Complaint  Patient presents with   Laceration    HPI Caitlin Jenkins is a 87 y.o. female who presents today for evaluation of a right leg laceration.  The patient earlier today had a car door open up and created a laceration to her right leg.  The patient contacted a friend who evaluated the area and instructed her to come into urgent care to undergo possible suture placement.  The patient has been able to control the bleeding.  The patient cannot recall when she last underwent a tetanus update.  She denies any signs of infection such as erythema or any purulent drainage.  Denies any falls affecting the right leg.  The patient does have a history of cirrhosis in addition to gastritis.   Laceration  Past Medical History:  Diagnosis Date   Biliary cirrhosis ([place])    Cancer of skin of leg    Cellulitis    CKD (chronic kidney disease) stage 3, GFR 30-59 ml/min (HCC)    Gout    Hiatal hernia Oct 2015   8.5cm   Hiatal hernia 06/26/2014   8.5cm    History of basal cell carcinoma (BCC) 09/12/2018   Oct 2016; Moh's surgery; right antitragus   History of diverticulitis 2000   Hyperlipidemia    Hyperparathyroidism, secondary renal ([place]) 07/11/2018   Managed by nephrologist   Hypertension    Hypomagnesemia 07/03/2019   Hypothyroidism    Skin cancer of nose     Patient Active Problem List   Diagnosis Date Noted   Malnutrition of moderate degree 03/31/2021   TIA (transient ischemic attack) 03/30/2021   Fall 03/14/2021   SDH (subdural hematoma) 03/14/2021   History of basal cell carcinoma (BCC) 09/12/2018   Hyperparathyroidism, secondary renal ([place]) 07/11/2018   Primary biliary cholangitis ([place]) 03/20/2017   Overweight (BMI 25.0-29.9) 11/29/2015   Carotid artery disease ([place]) 10/09/2015   Nail abnormalities 10/09/2015   Arthralgia of multiple joints 10/09/2015    Essential hypertension, benign 06/19/2015   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      Home Medications    Prior to Admission medications   Medication Sig Start Date End Date Taking? Authorizing Provider  acetaminophen (TYLENOL) 325 MG tablet Take 650 mg by mouth 2 (two) times daily.   Yes [provider]  Ascorbic Acid (VITAMIN C) 1000 MG tablet Take 1,000 mg by mouth 2 (two) times a day.   Yes [provider]  atorvastatin (LIPITOR) 10 MG tablet Take 1 tablet (10 mg total) by mouth at bedtime. (do not take for 3 days after taking colchicine) 02/18/21  Yes Kathrine Haddock, NP  Cholecalciferol (VITAMIN D-3) 25 MCG (1000 UT) CAPS Take 1 capsule by mouth daily.   Yes [provider]  COLACE 100 MG capsule Take 100 mg by mouth daily.  01/26/15  Yes [provider]  Colchicine 0.6 MG CAPS Take 0.6 mg by mouth daily as needed. 02/25/21  Yes Kathrine Haddock, NP  doxycycline (VIBRAMYCIN) 100 MG capsule Take 1 capsule (100 mg total) by mouth 2 (two) times daily. 08/27/21  Yes Lattie Corns, PA-C  levothyroxine (SYNTHROID) 75 MCG tablet Take 1 tablet (75 mcg total) by mouth daily. 02/24/21  Yes Kathrine Haddock, NP  losartan (COZAAR) 100 MG tablet Take 1 tablet by mouth daily (Avoid salt substitutes, no fruit smoothies.) 02/18/21  Yes Kathrine Haddock, NP  meclizine (ANTIVERT) 25 MG tablet Take 0.5-1 tablets (12.5-25 mg total) by mouth 3 (three) times daily as needed for dizziness. 03/16/21  Yes Delsa Grana, PA-C  niacinamide 500 MG tablet Take 1 tablet (500 mg total) by mouth 2 (two)  times daily with a meal. 11/13/17  Yes Lada, Satira Anis, MD  pantoprazole (PROTONIX) 40 MG tablet Take 1 tablet (40 mg total) by mouth at bedtime. 03/16/21  Yes Delsa Grana, PA-C  Probiotic Product (PROBIOTIC DAILY PO) Take by mouth.   Yes [provider]  thiamine 100 MG tablet Take 100 mg by mouth daily.   Yes [provider]  Turmeric 450 MG CAPS Take 500 mg by mouth daily.    Yes [provider]  ursodiol (ACTIGALL) 300 MG capsule Take 1 capsule (300 mg total) by mouth daily. 02/10/21  Yes Lucilla Lame, MD  vitamin A 3 MG (10000 UNITS) capsule Take 300 Units by mouth daily.   Yes [provider]  vitamin B-12 (CYANOCOBALAMIN) 250 MCG tablet Take 250 mcg by mouth daily.   Yes [provider]  amLODipine (NORVASC) 5 MG tablet Take 1 tablet (5 mg total) by mouth daily. 04/01/21 05/01/21  Nolberto Hanlon, MD    Family History Family History  Problem Relation Age of Onset   Emphysema Father    Asthma Father    Hypertension Daughter    Heart disease Daughter        3 stents   Diabetes Daughter    Hypertension Son    Cancer Son        prostate   Diabetes Son    Heart disease Daughter        heart attack, 2 stents   Multiple sclerosis Daughter    Diabetes Daughter    Fibromyalgia Daughter    Diabetes Daughter     Social History Social History   Tobacco Use   Smoking status: Never   Smokeless tobacco: Never   Tobacco comments:    smoking cessation materials not required  Vaping Use   Vaping Use: Never used  Substance Use Topics   Alcohol use: No   Drug use: No     Allergies   Indomethacin, Levofloxacin in d5w, Ace inhibitors, Augmentin [amoxicillin-pot clavulanate], Elemental sulfur, Penicillins, and Sulfa antibiotics   Review of Systems Review of Systems  Skin:  Positive for wound.  All other systems reviewed and are negative.   Physical Exam Triage Vital Signs ED Triage Vitals  Enc Vitals Group     BP 08/27/21 1756 (!)  180/90     Pulse Rate 08/27/21 1756 90     Resp 08/27/21 1756 18     Temp 08/27/21 1756 98.5 F (36.9 C)     Temp Source 08/27/21 1756 Oral     SpO2 08/27/21 1756 100 %     Weight 08/27/21 1755 165 lb (74.8 kg)     Height 08/27/21 1755 5\' 4"  (1.626 m)     Head  Circumference --      Peak Flow --      Pain Score 08/27/21 1755 0     Pain Loc --      Pain Edu? --      Excl. in [place]? --    No data found.  Updated Vital Signs BP (!) 180/90 (BP Location: Left Arm)   Pulse 90   Temp 98.5 F (36.9 C) (Oral)   Resp 18   Ht 5\' 4"  (1.626 m)   Wt 165 lb (74.8 kg)   SpO2 100%   BMI 28.32 kg/m   Visual Acuity Right Eye Distance:   Left Eye Distance:   Bilateral Distance:    Right Eye Near:   Left Eye Near:    Bilateral Near:     Physical Exam Skin examination of the right leg does reveal a superficial laceration along the midportion of the anterior aspect of the right tibia.  The laceration is in a angled configuration and measures approximately 7 cm in length.  The wound is extremely superficial along the more proximal aspect, deeper along the dorsal aspect.  The laceration begins more laterally does extend to the midline before going back out laterally forming a almost right ankgle.  It does appear that the skin can be brought over almost completely.  There is no evidence of bone, no foreign bodies visualized.  The patient is able to flex and extend at the right knee without discomfort.  Flexion extension of the right ankle without discomfort at today's visit.  The patient is intact light touch of the right lower extremity.  A laceration repair was performed at today's visit and is detailed below.  During laceration repair of the superficial skin, as sutures are being placed it was evident that the more proximal aspect of the laceration could not be repaired as the skin was under significant stress and the skin was thin enough that the suture was beginning to tear the skin.  3 vertical  mattress sutures were placed along the distal aspect of the laceration.  The skin was cleaned prior to a nonadherent dressing and Ace wrap being applied.  UC Treatments / Results  Labs (all labs ordered are listed, but only abnormal results are displayed) Labs Reviewed - No data to display  EKG   Radiology No results found.  Procedures Laceration Repair  Date/Time: 08/27/2021 8:41 PM Performed by: Lattie Corns, PA-C Authorized by: Lattie Corns, PA-C   Consent:    Consent obtained:  Verbal   Consent given by:  Patient   Risks, benefits, and alternatives were discussed: yes     Risks discussed:  Infection, need for additional repair, pain, poor cosmetic result and poor wound healing   Alternatives discussed:  No treatment and delayed treatment Universal protocol:    Procedure explained and questions answered to patient or proxy's satisfaction: yes     Relevant documents present and verified: yes     Test results available: yes     Imaging studies available: yes     Required blood products, implants, devices, and special equipment available: yes     Site/side marked: yes     Immediately prior to procedure, a time out was called: yes     Patient identity confirmed:  Verbally with patient Anesthesia:    Anesthesia method:  Local infiltration   Local anesthetic:  Lidocaine 1% w/o epi Laceration details:    Location:  Leg   Leg location:  R lower leg  Length (cm):  7   Depth (mm):  2 Pre-procedure details:    Preparation:  Patient was prepped and draped in usual sterile fashion Exploration:    Contaminated: no   Treatment:    Area cleansed with:  Povidone-iodine and saline   Amount of cleaning:  Standard   Irrigation solution:  Sterile saline   Irrigation method:  Syringe   Visualized foreign bodies/material removed: no     Debridement:  None Skin repair:    Repair method:  Sutures   Suture size:  4-0   Wound skin closure material used: Ethilon.    Suture technique:  Vertical mattress   Number of sutures:  3 Approximation:    Approximation:  Close Repair type:    Repair type:  Simple Post-procedure details:    Dressing:  Bulky dressing   Procedure completion:  Tolerated Comments:     The proximal aspect of the laceration was unable to be repaired due to tension of the skin and thinness of the skin.  Felt more damage would be done by attempting to bring the skin together and possibly expose her to more skin tearing. (including critical care time)  Medications Ordered in UC Medications  Tdap (BOOSTRIX) injection 0.5 mL (0.5 mLs Intramuscular Given 08/27/21 1942)    Initial Impression / Assessment and Plan / UC Course  I have reviewed the triage vital signs and the nursing notes.  Pertinent labs & imaging results that were available during my care of the patient were reviewed by me and considered in my medical decision making (see chart for details).     1.  Treatment options were discussed today with the patient. 2.  The patient underwent laceration repair of the more distal aspect of the laceration.  The more proximal aspect of the laceration could not be repaired due to the thinness of the skin. 3.  The skin was thoroughly irrigated prior to bulky dressing being applied.  Instructions provided to perform daily dressing changes until sutures can be removed.  Keep the laceration dry and intact. 4.  Can apply Neosporin daily at this time.  The patient was placed on doxycycline for antibiotic coverage, she is allergic to Bactrim and penicillin. 5.  Follow-up with [place] urgent care in 10 days for suture removal Final Clinical Impressions(s) / UC Diagnoses   Final diagnoses:  Laceration of right lower extremity, initial encounter     Discharge Instructions      -Keep skin dry and intact. -Perform daily dressing changes apply Neosporin over the laceration site. -Keep leg covered with Ace wrap. -Follow-up in 10 days for suture  removal.   ED Prescriptions     Medication Sig Dispense Auth. Provider   doxycycline (VIBRAMYCIN) 100 MG capsule Take 1 capsule (100 mg total) by mouth 2 (two) times daily. 14 capsule Lattie Corns, PA-C      PDMP not reviewed this encounter.   Lattie Corns, [place] 08/27/21 2045

## 2021-08-30 DIAGNOSIS — Z961 Presence of intraocular lens: Secondary | ICD-10-CM | POA: Diagnosis not present

## 2021-08-30 DIAGNOSIS — H02886 Meibomian gland dysfunction of left eye, unspecified eyelid: Secondary | ICD-10-CM | POA: Diagnosis not present

## 2021-08-30 DIAGNOSIS — H35033 Hypertensive retinopathy, bilateral: Secondary | ICD-10-CM | POA: Diagnosis not present

## 2021-08-30 DIAGNOSIS — H02883 Meibomian gland dysfunction of right eye, unspecified eyelid: Secondary | ICD-10-CM | POA: Diagnosis not present

## 2021-08-30 DIAGNOSIS — H524 Presbyopia: Secondary | ICD-10-CM | POA: Diagnosis not present

## 2021-08-31 ENCOUNTER — Other Ambulatory Visit: Payer: Self-pay | Admitting: Family Medicine

## 2021-08-31 ENCOUNTER — Telehealth: Payer: Self-pay

## 2021-08-31 DIAGNOSIS — E039 Hypothyroidism, unspecified: Secondary | ICD-10-CM

## 2021-08-31 MED ORDER — LEVOTHYROXINE SODIUM 75 MCG PO TABS
75.0000 ug | ORAL_TABLET | Freq: Every day | ORAL | 1 refills | Status: DC
Start: 2021-08-31 — End: 2021-11-02

## 2021-08-31 NOTE — Telephone Encounter (Signed)
Pt called to notify that she only has 6 tablets left of her levothyroxine 75 mcg and needs refill sent to [facility] order pharmacy. Pt had labs done to check level at last office visit with Dr. Ky Barban on 07/13/21. Please contact when sent in.

## 2021-08-31 NOTE — Telephone Encounter (Signed)
Pt was notified.  

## 2021-09-08 ENCOUNTER — Ambulatory Visit
Admission: EM | Admit: 2021-09-08 | Discharge: 2021-09-08 | Disposition: A | Discharge status: Home / Self Care | Payer: PPO | Attending: Physician Assistant | Admitting: Physician Assistant

## 2021-09-08 ENCOUNTER — Other Ambulatory Visit: Payer: Self-pay

## 2021-09-08 VITALS — BP 171/84 | HR 83 | Temp 97.9°F | Resp 18 | Ht 64.0 in | Wt 164.9 lb

## 2021-09-08 DIAGNOSIS — S81801A Unspecified open wound, right lower leg, initial encounter: Secondary | ICD-10-CM

## 2021-09-08 DIAGNOSIS — Z4802 Encounter for removal of sutures: Secondary | ICD-10-CM | POA: Diagnosis not present

## 2021-09-08 NOTE — ED Provider Notes (Signed)
[facility]    CSN: [id number] Arrival date & time: 09/08/21  1047      History   Chief Complaint Chief Complaint  Patient presents with   Appointment   Suture / Staple Removal    HPI Caitlin Jenkins is a 87 y.o. female presenting for suture removal.  Patient was seen in clinic 12 days ago and had 3 sutures placed in her right lower leg.  She says she initially injured the leg when she slammed it with a car door.  She says she took all the antibiotics, was on doxycycline.  Reports over the past 4 to 5 days it is started to become tender.  She denies any drainage from the area.  Says she still has a bit of an open wound which she had before.  Has been keeping it covered with a nonstick bandage.  Concerned since it has not healed.  No fevers.  Taking Tylenol which helps the pain.  HPI  Past Medical History:  Diagnosis Date   Biliary cirrhosis ([place])    Cancer of skin of leg    Cellulitis    CKD (chronic kidney disease) stage 3, GFR 30-59 ml/min (HCC)    Gout    Hiatal hernia Oct 2015   8.5cm   Hiatal hernia 06/26/2014   8.5cm    History of basal cell carcinoma (BCC) 09/12/2018   Oct 2016; Moh's surgery; right antitragus   History of diverticulitis 2000   Hyperlipidemia    Hyperparathyroidism, secondary renal ([place]) 07/11/2018   Managed by nephrologist   Hypertension    Hypomagnesemia 07/03/2019   Hypothyroidism    Skin cancer of nose     Patient Active Problem List   Diagnosis Date Noted   Malnutrition of moderate degree 03/31/2021   TIA (transient ischemic attack) 03/30/2021   Fall 03/14/2021   SDH (subdural hematoma) 03/14/2021   History of basal cell carcinoma (BCC) 09/12/2018   Hyperparathyroidism, secondary renal ([place]) 07/11/2018   Primary biliary cholangitis ([place]) 03/20/2017   Overweight (BMI 25.0-29.9) 11/29/2015   Carotid artery disease ([place]) 10/09/2015   Nail abnormalities 10/09/2015   Arthralgia of multiple joints 10/09/2015   Essential  hypertension, benign 06/19/2015   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      Home Medications    Prior to Admission medications   Medication Sig Start Date End Date Taking? Authorizing Provider  acetaminophen (TYLENOL) 325 MG tablet Take 650 mg by mouth 2 (two) times daily.   Yes [provider]  amLODipine (NORVASC) 5 MG tablet Take 1 tablet (5 mg total) by mouth daily. 04/01/21 09/08/21 Yes Nolberto Hanlon, MD  Ascorbic Acid (VITAMIN C) 1000 MG tablet Take 1,000 mg by mouth 2 (two) times a day.   Yes [provider]  atorvastatin (LIPITOR) 10 MG tablet Take 1 tablet (10 mg total) by mouth at bedtime. (do not take for 3 days after taking colchicine) 02/18/21  Yes Kathrine Haddock, NP  Cholecalciferol (VITAMIN D-3) 25 MCG (1000 UT)  CAPS Take 1 capsule by mouth daily.   Yes [provider]  COLACE 100 MG capsule Take 100 mg by mouth daily.  01/26/15  Yes [provider]  Colchicine 0.6 MG CAPS Take 0.6 mg by mouth daily as needed. 02/25/21  Yes Kathrine Haddock, NP  doxycycline (VIBRAMYCIN) 100 MG capsule Take 1 capsule (100 mg total) by mouth 2 (two) times daily. 08/27/21  Yes Lattie Corns, PA-C  levothyroxine (SYNTHROID) 75 MCG tablet Take 1 tablet (75 mcg total) by mouth daily. 08/31/21  Yes Myles Gip, DO  losartan (COZAAR) 100 MG tablet Take 1 tablet by mouth daily (Avoid salt substitutes, no fruit smoothies.) 02/18/21  Yes Kathrine Haddock, NP  meclizine (ANTIVERT) 25 MG tablet Take 0.5-1 tablets (12.5-25 mg total) by mouth 3 (three) times daily as needed for dizziness.  03/16/21  Yes Delsa Grana, PA-C  niacinamide 500 MG tablet Take 1 tablet (500 mg total) by mouth 2 (two) times daily with a meal. 11/13/17  Yes Lada, Satira Anis, MD  pantoprazole (PROTONIX) 40 MG tablet Take 1 tablet (40 mg total) by mouth at bedtime. 03/16/21  Yes Delsa Grana, PA-C  Probiotic Product (PROBIOTIC DAILY PO) Take by mouth.   Yes [provider]  thiamine 100 MG tablet Take 100 mg by mouth daily.   Yes [provider]  Turmeric 450 MG CAPS Take 500 mg by mouth daily.    Yes [provider]  vitamin A 3 MG (10000 UNITS) capsule Take 300 Units by mouth daily.   Yes [provider]  vitamin B-12 (CYANOCOBALAMIN) 250 MCG tablet Take 250 mcg by mouth daily.   Yes [provider]  ursodiol (ACTIGALL) 300 MG capsule Take 1 capsule (300 mg total) by mouth daily. 02/10/21   Lucilla Lame, MD    Family History Family History  Problem Relation Age of Onset   Emphysema Father    Asthma Father    Hypertension Daughter    Heart disease Daughter        3 stents   Diabetes Daughter    Hypertension Son    Cancer Son        prostate   Diabetes Son    Heart disease Daughter        heart attack, 2 stents   Multiple sclerosis Daughter    Diabetes Daughter    Fibromyalgia Daughter    Diabetes Daughter     Social History Social History   Tobacco Use   Smoking status: Never   Smokeless tobacco: Never   Tobacco comments:    smoking cessation materials not required  Vaping Use   Vaping Use: Never used  Substance Use Topics   Alcohol use: No   Drug use: No     Allergies   Indomethacin, Levofloxacin in d5w, Ace inhibitors, Augmentin [amoxicillin-pot clavulanate], Elemental sulfur, Penicillins, and Sulfa antibiotics   Review of Systems Review of Systems  Constitutional:  Negative for fever.  Musculoskeletal:  Negative for arthralgias and joint swelling.  Skin:  Positive for wound. Negative for color change.  Neurological:  Negative for  weakness and numbness.    Physical Exam Triage Vital Signs ED Triage Vitals  Enc Vitals Group     BP 09/08/21 1154 (!) 171/84     Pulse Rate 09/08/21 1154 83     Resp 09/08/21 1154 18     Temp 09/08/21 1154 97.9 F (36.6 C)     Temp Source 09/08/21 1154 Oral  SpO2 09/08/21 1154 99 %     Weight 09/08/21 1152 164 lb 14.5 oz (74.8 kg)     Height 09/08/21 1152 5\' 4"  (1.626 m)     Head Circumference --      Peak Flow --      Pain Score 09/08/21 1152 0     Pain Loc --      Pain Edu? --      Excl. in [place]? --    No data found.  Updated Vital Signs BP (!) 171/84 (BP Location: Left Arm)    Pulse 83    Temp 97.9 F (36.6 C) (Oral)    Resp 18    Ht 5\' 4"  (1.626 m)    Wt 164 lb 14.5 oz (74.8 kg)    SpO2 99%    BMI 28.31 kg/m      Physical Exam Vitals and nursing note reviewed.  Constitutional:      General: She is not in acute distress.    Appearance: Normal appearance. She is not ill-appearing or toxic-appearing.  HENT:     Head: Normocephalic and atraumatic.  Eyes:     General: No scleral icterus.       Right eye: No discharge.        Left eye: No discharge.     Conjunctiva/sclera: Conjunctivae normal.  Cardiovascular:     Rate and Rhythm: Normal rate and regular rhythm.  Pulmonary:     Effort: Pulmonary effort is normal. No respiratory distress.  Musculoskeletal:     Cervical back: Neck supple.  Skin:    General: Skin is dry.     Comments: See image below. Patient has 1.5-2 cm x 1 cm area of open wound with 3 sutures placed at inferior aspect of wound. No surrounding erythema and no drainage. Entire areas is TTP.  Neurological:     General: No focal deficit present.     Mental Status: She is alert. Mental status is at baseline.     Motor: No weakness.     Gait: Gait normal.  Psychiatric:        Mood and Affect: Mood normal.        Behavior: Behavior normal.        Thought Content: Thought content normal.      UC Treatments / Results  Labs (all labs ordered  are listed, but only abnormal results are displayed) Labs Reviewed - No data to display  EKG   Radiology No results found.  Procedures Procedures (including critical care time)  Medications Ordered in UC Medications - No data to display  Initial Impression / Assessment and Plan / UC Course  I have reviewed the triage vital signs and the nursing notes.  Pertinent labs & imaging results that were available during my care of the patient were reviewed by me and considered in my medical decision making (see chart for details).  87 year old female returning for suture removal.  Patient seen 12 days ago and had 3 sutures placed.  She still has an open wound.  Image was taken today and can be seen above.  3 sutures were removed after I cleaned the area with wound cleanser.  The area where they sutures were placed held together nicely but she still has the open wound.  Clean the wound bed and applied Steri-Strips.  Covered with nonadherent bandage and Coban.  There is no evidence of infection at this time.  Placing referral to wound care since she still has an open  wound.  She says it is no deeper than when she was seen previously.  Reviewed going to ED if there is any increased pain, fever or pustular drainage from the area before she can see wound care.  Reviewed wound care guidelines with patient.   Final Clinical Impressions(s) / UC Diagnoses   Final diagnoses:  Open wound of right lower leg, initial encounter  Removal of staples  Visit for suture removal     Discharge Instructions      -I have removed the sutures - You should follow-up with wound care since the wound is still open.  I placed a referral and someone should call you in the next few days. - In the meantime, clean with plain soap and water and apply antibacterial ointment very sparingly.  Apply that just 1 time a day. - Change your bandage more often if it is soaked. - Tylenol for discomfort. - If he becomes more red,  swollen or painful before he can see wound care, please return or go to ER.     ED Prescriptions   None    PDMP not reviewed this encounter.   Danton Clap, PA-C 09/08/21 1306

## 2021-09-08 NOTE — ED Triage Notes (Addendum)
Pt presents to [place] for suture removal. She had 3 sutures place on 08/27/21 on her right lower leg. Pt states her leg is hurting worse now than it did with the laceration happened. She has been taking doxy as prescribed.

## 2021-09-08 NOTE — Discharge Instructions (Signed)
-  I have removed the sutures - You should follow-up with wound care since the wound is still open.  I placed a referral and someone should call you in the next few days. - In the meantime, clean with plain soap and water and apply antibacterial ointment very sparingly.  Apply that just 1 time a day. - Change your bandage more often if it is soaked. - Tylenol for discomfort. - If he becomes more red, swollen or painful before he can see wound care, please return or go to ER.

## 2021-09-15 ENCOUNTER — Telehealth: Payer: Self-pay

## 2021-09-15 NOTE — Telephone Encounter (Signed)
Spoke to pt about her wound care, pt states everything seems to be getting better.

## 2021-09-22 ENCOUNTER — Other Ambulatory Visit: Payer: Self-pay

## 2021-09-22 ENCOUNTER — Encounter: Payer: PPO | Attending: Internal Medicine | Admitting: Internal Medicine

## 2021-09-22 DIAGNOSIS — T798XXA Other early complications of trauma, initial encounter: Secondary | ICD-10-CM | POA: Diagnosis not present

## 2021-09-22 DIAGNOSIS — X58XXXA Exposure to other specified factors, initial encounter: Secondary | ICD-10-CM | POA: Insufficient documentation

## 2021-09-22 DIAGNOSIS — K743 Primary biliary cirrhosis: Secondary | ICD-10-CM | POA: Insufficient documentation

## 2021-09-22 DIAGNOSIS — L97812 Non-pressure chronic ulcer of other part of right lower leg with fat layer exposed: Secondary | ICD-10-CM | POA: Diagnosis not present

## 2021-09-22 DIAGNOSIS — I10 Essential (primary) hypertension: Secondary | ICD-10-CM | POA: Diagnosis not present

## 2021-09-22 DIAGNOSIS — E039 Hypothyroidism, unspecified: Secondary | ICD-10-CM | POA: Diagnosis not present

## 2021-09-22 NOTE — Progress Notes (Signed)
Caitlin Jenkins ([id number]) Visit Report for 09/22/2021 Chief Complaint Document Details Patient Name: Caitlin Jenkins Date of Service: 09/22/2021 10:00 AM Medical Record Number: [id number] Patient Account Number: [id number] Date of Birth/Sex: June 26, 1932 (87 y.o. F) Treating RN: Donnamarie Poag Primary Care Provider: Delsa Grana Other Clinician: Referring Provider: Laurene Footman Treating Provider/Extender: Yaakov Guthrie in Treatment: 0 Information Obtained from: Patient Chief Complaint Right lower extremity wound caused by trauma Electronic Signature(s) Signed: 09/22/2021 11:09:34 AM By: Kalman Shan DO Entered By: Kalman Shan on 09/22/2021 11:02:13 Caitlin Jenkins ([id number]) -------------------------------------------------------------------------------- Debridement Details Patient Name: Caitlin Jenkins Date of Service: 09/22/2021 10:00 AM Medical Record Number: [id number] Patient Account Number: [id number] Date of Birth/Sex: Dec 08, 1931 (87 y.o. F) Treating RN: Donnamarie Poag Primary Care Provider: Delsa Grana Other Clinician: Referring Provider: Laurene Footman Treating Provider/Extender: Yaakov Guthrie in Treatment: 0 Debridement Performed for Wound #1 Right,Anterior Lower Leg Assessment: Performed By: Physician Kalman Shan, MD Debridement Type: Debridement Severity of Tissue Pre Debridement: Fat layer exposed Level of Consciousness (Pre- Awake and Alert procedure): Pre-procedure Verification/Time Out Yes - 10:45 Taken: Start Time: 10:46 Pain Control: Lidocaine Total Area Debrided (L x W): 1.4 (cm) x 1 (cm) = 1.4 (cm) Tissue and other material Viable, Non-Viable, Slough, Subcutaneous, Slough debrided: Level: Skin/Subcutaneous Tissue Debridement Description: Excisional Instrument: Curette Bleeding: Minimum Hemostasis Achieved: Pressure Response to Treatment: Procedure was tolerated well Level of Consciousness  (Post- Awake and Alert procedure): Post Debridement Measurements of Total Wound Length: (cm) 1.4 Width: (cm) 1 Depth: (cm) 0.3 Volume: (cm) 0.33 Character of Wound/Ulcer Post Debridement: Improved Severity of Tissue Post Debridement: Fat layer exposed Post Procedure Diagnosis Same as Pre-procedure Electronic Signature(s) Signed: 09/22/2021 11:09:34 AM By: Kalman Shan DO Signed: 09/22/2021 1:20:37 PM By: Donnamarie Poag Entered By: Donnamarie Poag on 09/22/2021 10:49:22 Caitlin Jenkins ([id number]) -------------------------------------------------------------------------------- HPI Details Patient Name: Caitlin Jenkins Date of Service: 09/22/2021 10:00 AM Medical Record Number: [id number] Patient Account Number: [id number] Date of Birth/Sex: 1932-01-22 (87 y.o. 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 She states she hit her leg against the car door. She was initially seen in the ED on 12/2 for this issue and had 3 sutures in place and started on doxycycline. She followed up in the ED on 12/14 and had sutures removed. She has been placing antibiotic ointment on the wound bed daily. She currently denies signs of infection. Electronic Signature(s) Signed: 09/22/2021 11:09:34 AM By: Kalman Shan DO Entered By: Kalman Shan on 09/22/2021 11:05:24 Caitlin Jenkins ([id number]) -------------------------------------------------------------------------------- Physical Exam Details Patient  Name: Caitlin Jenkins Date of Service: 09/22/2021 10:00 AM Medical Record Number: [id number] Patient Account Number: [id number] Date of Birth/Sex: April 16, 1932 (87 y.o. F) Treating RN: Donnamarie Poag Primary Care Provider: Delsa Grana Other Clinician: Referring Provider: Laurene Footman Treating Provider/Extender: Yaakov Guthrie in Treatment: 0 Constitutional . Cardiovascular . Psychiatric . Notes Right lower extremity: To the anterior aspect there is an open wound with nonviable tissue throughout. No signs of infection. Electronic Signature(s) Signed: 09/22/2021 11:09:34 AM By: Kalman Shan DO Entered By: Kalman Shan on 09/22/2021 11:05:57 Caitlin Jenkins ([id number]) --------------------------------------------------------------------------------  Physician Orders Details Patient Name: Caitlin Jenkins Date of Service: 09/22/2021 10:00 AM Medical Record Number: [id number] Patient Account Number: [id number] Date of Birth/Sex: 06/26/1932 (87 y.o. F) Treating RN: Donnamarie Poag Primary Care Provider: Delsa Grana Other Clinician: Referring Provider: Laurene Footman Treating Provider/Extender: Yaakov Guthrie in Treatment: 0 Verbal / Phone Orders: No Diagnosis Coding ICD-10 Coding Code Description [phone] Non-pressure chronic ulcer of other part of right lower leg with fat layer exposed Follow-up Appointments o Return Appointment in 1 week. o Nurse Visit as needed Bathing/ Shower/ Hygiene o Clean wound with Normal Saline or wound cleanser. - change dressing after shower or keep it dry o No tub bath. Anesthetic (Use 'Patient Medications' Section for Anesthetic Order Entry) o Lidocaine applied to wound bed Edema Control - Lymphedema / Segmental Compressive Device / Other o Elevate leg(s) parallel to the floor when sitting. o DO YOUR BEST to sleep in the bed at night. DO NOT sleep in your recliner. Long hours of sitting in a recliner leads  to swelling of the legs and/or potential wounds on your backside. Additional Orders / Instructions o Follow Nutritious Diet and Increase Protein Intake Wound Treatment Wound #1 - Lower Leg Wound Laterality: Right, Anterior Cleanser: Byram Ancillary Kit - 15 Day Supply (DME) (Generic) 1 x Per Day/15 Days Discharge Instructions: Use supplies as instructed; Kit contains: (15) Saline Bullets; (15) 3x3 Gauze; 15 pr Gloves Cleanser: Normal Saline (Generic) 1 x Per Day/15 Days Discharge Instructions: Wash your hands with soap and water. Remove old dressing, discard into plastic bag and place into trash. Cleanse the wound with Normal Saline prior to applying a clean dressing using gauze sponges, not tissues or cotton balls. Do not scrub or use excessive force. Pat dry using gauze sponges, not tissue or cotton balls. Cleanser: Soap and Water 1 x Per HUD/14 Days Discharge Instructions: Gently cleanse wound with antibacterial soap, rinse and pat dry prior to dressing wounds Primary Dressing: Hydrofera Blue Ready Transfer Foam, 2.5x2.5 (in/in) 1 x Per Day/15 Days Discharge Instructions: Apply Hydrofera Blue Ready to wound bed as directed Secondary Dressing: Zetuvit Plus Silicone Border Dressing 4x4 (in/in) (DME) (Generic) 1 x Per Day/15 Days Electronic Signature(s) Signed: 09/22/2021 11:09:34 AM By: Kalman Shan DO Signed: 09/22/2021 1:20:37 PM By: Donnamarie Poag Entered By: Donnamarie Poag on 09/22/2021 10:52:55 Caitlin Jenkins ([id number]) -------------------------------------------------------------------------------- Problem List Details Patient Name: Caitlin Jenkins Date of Service: 09/22/2021 10:00 AM Medical Record Number: [id number] Patient Account Number: [id number] Date of Birth/Sex: 17-Feb-1932 (87 y.o. F) Treating RN: Donnamarie Poag Primary Care Provider: Delsa Grana Other Clinician: Referring Provider: Laurene Footman Treating Provider/Extender: Yaakov Guthrie in Treatment:  0 Active Problems ICD-10 Encounter Code Description Active Date MDM Diagnosis [phone] Non-pressure chronic ulcer of other part of right lower leg with fat layer 09/22/2021 No Yes exposed E03.9 Hypothyroidism, unspecified 09/22/2021 No Yes I10 Essential (primary) hypertension 09/22/2021 No Yes K74.3 Primary biliary cirrhosis 09/22/2021 No Yes T79.8XXA Other early complications of trauma, initial encounter 09/22/2021 No Yes Inactive Problems Resolved Problems Electronic Signature(s) Signed: 09/22/2021 11:09:34 AM By: Kalman Shan DO Entered By: Kalman Shan on 09/22/2021 11:08:35 Caitlin Jenkins ([id number]) -------------------------------------------------------------------------------- Progress Note Details Patient Name: Caitlin Jenkins Date of Service: 09/22/2021 10:00 AM Medical Record Number: [id number] Patient Account Number: [id number] Date of Birth/Sex: [birth year] (87 y.o. F) Treating RN: Donnamarie Poag Primary Care Provider: Delsa Grana Other Clinician: Referring Provider: Laurene Footman Treating Provider/Extender: Yaakov Guthrie in Treatment: 0 Subjective Chief Complaint Information obtained from Patient  Right lower extremity wound caused by trauma History of Present Illness (HPI) Admission 09/22/2021 Ms. 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 She states she hit her leg against the car door. She was initially seen in the ED on 12/2 for this issue and had 3 sutures in place and started on doxycycline. She followed up in the ED on 12/14 and had sutures removed. She has been placing antibiotic ointment on the wound bed daily. She currently denies signs of infection. Patient History Information obtained from  Patient. Allergies penicillin (Severity: Moderate), Levaquin (Severity: Moderate), Sulfa (Sulfonamide Antibiotics) (Severity: Moderate), Indocin, Augmentin, ACE Inhibitors Social History Never smoker, Marital Status - Widowed, Alcohol Use - Rarely, Drug Use - No History, Caffeine Use - Daily. Medical History Cardiovascular Patient has history of Coronary Artery Disease, Hypertension Gastrointestinal Patient has history of Cirrhosis Musculoskeletal Patient has history of Gout Review of Systems (ROS) Constitutional Symptoms ([place]) Denies complaints or symptoms of Fatigue, Fever, Chills, Marked Weight Change. Eyes Denies complaints or symptoms of Dry Eyes, Vision Changes, Glasses / Contacts. Ear/Nose/Mouth/Throat Denies complaints or symptoms of Difficult clearing ears, Sinusitis. Hematologic/Lymphatic stated had a fall in summer with a "brain bleed" Respiratory Denies complaints or symptoms of Chronic or frequent coughs, Shortness of Breath. Endocrine Complains or has symptoms of Thyroid disease. Genitourinary CKD stage 3 Immunological Denies complaints or symptoms of Hives, Itching. Integumentary (Skin) hx basal cell skin removed Neurologic Denies complaints or symptoms of Numbness/parasthesias, Focal/Weakness. Psychiatric Denies complaints or symptoms of Anxiety, Claustrophobia. Caitlin Jenkins, Caitlin Jenkins ([id number]) Objective Constitutional Vitals Time Taken: 10:18 AM, Height: 64 in, Source: Stated, Weight: 150 lbs, Source: Measured, BMI: 25.7, Temperature: 97.8 F, Pulse: 85 bpm, Respiratory Rate: 16 breaths/min, Blood Pressure: 188/101 mmHg. General Notes: stated BP runs high when rushing; follows with PCP; no c/o dizziness or h/a General Notes: Right lower extremity: To the anterior aspect there is an open wound with nonviable tissue throughout. No signs of infection. Integumentary (Hair, Skin) Wound #1 status is Open. Original cause of wound was Laceration. The  date acquired was: 08/27/2021. The wound is located on the Right,Anterior Lower Leg. The wound measures 1.4cm length x 1cm width x 0.1cm depth; 1.1cm^2 area and 0.11cm^3 volume. There is Fat Layer (Subcutaneous Tissue) exposed. There is no tunneling noted, however, there is undermining starting at 5:00 and ending at 6:00 with a maximum distance of 0.2cm. There is a medium amount of serosanguineous drainage noted. There is small (1-33%) red, pink granulation within the wound bed. There is a large (67-100%) amount of necrotic tissue within the wound bed including Eschar and Adherent Slough. Assessment Active Problems ICD-10 Non-pressure chronic ulcer of other part of right lower leg with fat layer exposed Hypothyroidism, unspecified Essential (primary) hypertension Primary biliary cirrhosis Patient presents with a 1 month history of wound to her right lower extremity due to trauma. I debrided nonviable tissue. No signs of infection on exam. ABIs in office were 0.95 on the right. At this time I recommended Hydrofera Blue daily. Follow-up in 1 week Procedures Wound #1 Pre-procedure diagnosis of Wound #1 is a Venous Leg Ulcer located on the Right,Anterior Lower Leg .Severity of Tissue Pre Debridement is: Fat layer exposed. There was a Excisional Skin/Subcutaneous Tissue Debridement with a total area of 1.4 sq cm performed by Kalman Shan,  MD. With the following instrument(s): Curette to remove Viable and Non-Viable tissue/material. Material removed includes Subcutaneous Tissue and Slough and after achieving pain control using Lidocaine. A time out was conducted at 10:45, prior to the start of the procedure. A Minimum amount of bleeding was controlled with Pressure. The procedure was tolerated well. Post Debridement Measurements: 1.4cm length x 1cm width x 0.3cm depth; 0.33cm^3 volume. Character of Wound/Ulcer Post Debridement is improved. Severity of Tissue Post Debridement is: Fat layer  exposed. Post procedure Diagnosis Wound #1: Same as Pre-Procedure Plan Follow-up Appointments: Return Appointment in 1 week. Nurse Visit as needed Bathing/ Shower/ Hygiene: Clean wound with Normal Saline or wound cleanser. - change dressing after shower or keep it dry No tub bath. Anesthetic (Use 'Patient Medications' Section for Anesthetic Order Entry): Lidocaine applied to wound bed Edema Control - Lymphedema / Segmental Compressive Device / Other: Elevate leg(s) parallel to the floor when sitting. DO YOUR BEST to sleep in the bed at night. DO NOT sleep in your recliner. Long hours of sitting in a recliner leads to swelling of the legs and/or potential wounds on your backside. Additional Orders / Instructions: Follow Nutritious Diet and Increase Protein Intake WOUND #1: - Lower Leg Wound Laterality: Right, Anterior Caitlin Jenkins, Caitlin Jenkins. ([id number]) Cleanser: Byram Ancillary Kit - 15 Day Supply (DME) (Generic) 1 x Per Day/15 Days Discharge Instructions: Use supplies as instructed; Kit contains: (15) Saline Bullets; (15) 3x3 Gauze; 15 pr Gloves Cleanser: Normal Saline (Generic) 1 x Per Day/15 Days Discharge Instructions: Wash your hands with soap and water. Remove old dressing, discard into plastic bag and place into trash. Cleanse the wound with Normal Saline prior to applying a clean dressing using gauze sponges, not tissues or cotton balls. Do not scrub or use excessive force. Pat dry using gauze sponges, not tissue or cotton balls. Cleanser: Soap and Water 1 x Per Day/15 Days Discharge Instructions: Gently cleanse wound with antibacterial soap, rinse and pat dry prior to dressing wounds Primary Dressing: Hydrofera Blue Ready Transfer Foam, 2.5x2.5 (in/in) 1 x Per Day/15 Days Discharge Instructions: Apply Hydrofera Blue Ready to wound bed as directed Secondary Dressing: Zetuvit Plus Silicone Border Dressing 4x4 (in/in) (DME) (Generic) 1 x Per Day/15 Days 1. In office sharp  debridement 2. Hydrofera Blue 3. Follow-up in 1 week Electronic Signature(s) Signed: 09/22/2021 11:09:34 AM By: Kalman Shan DO Entered By: Kalman Shan on 09/22/2021 11:07:03 Caitlin Jenkins ([id number]) -------------------------------------------------------------------------------- ROS/PFSH Details Patient Name: Caitlin Jenkins Date of Service: 09/22/2021 10:00 AM Medical Record Number: [id number] Patient Account Number: [id number] Date of Birth/Sex: 04-09-1932 (87 y.o. F) Treating RN: Donnamarie Poag Primary Care Provider: Delsa Grana Other Clinician: Referring Provider: Laurene Footman Treating Provider/Extender: Yaakov Guthrie in Treatment: 0 Information Obtained From Patient Constitutional Symptoms (General Health) Complaints and Symptoms: Negative for: Fatigue; Fever; Chills; Marked Weight Change Eyes Complaints and Symptoms: Negative for: Dry Eyes; Vision Changes; Glasses / Contacts Ear/Nose/Mouth/Throat Complaints and Symptoms: Negative for: Difficult clearing ears; Sinusitis Respiratory Complaints and Symptoms: Negative for: Chronic or frequent coughs; Shortness of Breath Endocrine Complaints and Symptoms: Positive for: Thyroid disease Immunological Complaints and Symptoms: Negative for: Hives; Itching Neurologic Complaints and Symptoms: Negative for: Numbness/parasthesias; Focal/Weakness Psychiatric Complaints and Symptoms: Negative for: Anxiety; Claustrophobia Hematologic/Lymphatic Complaints and Symptoms: Review of System Notes: stated had a fall in summer with a "brain bleed" Cardiovascular Medical History: Positive for: Coronary Artery Disease; Hypertension Gastrointestinal Medical History: Positive for: Cirrhosis Caitlin Jenkins, Caitlin Jenkins. ([id number]) Genitourinary Complaints and Symptoms: Review of System  Notes: CKD stage 3 Integumentary (Skin) Complaints and Symptoms: Review of System Notes: hx basal cell skin  removed Musculoskeletal Medical History: Positive for: Gout Oncologic Immunizations Pneumococcal Vaccine: Received Pneumococcal Vaccination: No Implantable Devices None Family and Social History Never smoker; Marital Status - Widowed; Alcohol Use: Rarely; Drug Use: No History; Caffeine Use: Daily Electronic Signature(s) Signed: 09/22/2021 10:35:23 AM By: Kalman Shan DO Signed: 09/22/2021 1:20:37 PM By: Donnamarie Poag Entered By: Donnamarie Poag on 09/22/2021 10:26:08 Caitlin Jenkins ([id number]) -------------------------------------------------------------------------------- SuperBill Details Patient Name: Caitlin Jenkins Date of Service: 09/22/2021 Medical Record Number: [id number] Patient Account Number: [id number] Date of Birth/Sex: October 17, 1931 (87 y.o. F) Treating RN: Donnamarie Poag Primary Care Provider: Delsa Grana Other Clinician: Referring Provider: Laurene Footman Treating Provider/Extender: Yaakov Guthrie in Treatment: 0 Diagnosis Coding ICD-10 Codes Code Description [phone] Non-pressure chronic ulcer of other part of right lower leg with fat layer exposed E03.9 Hypothyroidism, unspecified I10 Essential (primary) hypertension K74.3 Primary biliary cirrhosis T79.8XXA Other early complications of trauma, initial encounter Facility Procedures CPT4 Code: [id number] Description: 99213 - WOUND CARE VISIT-LEV 3 EST PT Modifier: Quantity: 1 CPT4 Code: 28833744 Description: 51460 - DEB SUBQ TISSUE 20 SQ CM/< Modifier: Quantity: 1 CPT4 Code: Description: ICD-10 Diagnosis Description Q79.987 Non-pressure chronic ulcer of other part of right lower leg with fat laye Modifier: r exposed Quantity: Physician Procedures CPT4 Code: [id number] Description: WC PHYS LEVEL 3 o NEW PT Modifier: Quantity: 1 CPT4 Code: Description: ICD-10 Diagnosis Description M18.485 Non-pressure chronic ulcer of other part of right lower leg with fat lay T79.8XXA Other early complications  of trauma, initial encounter I10 Essential (primary) hypertension Modifier: er exposed Quantity: CPT4 Code: [id number] Description: 11042 - WC PHYS SUBQ TISS 20 SQ CM Modifier: Quantity: 1 CPT4 Code: Description: ICD-10 Diagnosis Description L97.812 Non-pressure chronic ulcer of other part of right lower leg with fat lay Modifier: er exposed Quantity: Electronic Signature(s) Signed: 09/22/2021 11:09:34 AM By: Kalman Shan DO Entered By: Kalman Shan on 09/22/2021 11:09:01

## 2021-09-22 NOTE — Progress Notes (Signed)
JAZAE, GANDOLFI ([id number]) Visit Report for 09/22/2021 Allergy List Details Patient Name: Caitlin Jenkins Date of Service: 09/22/2021 10:00 AM Medical Record Number: [id number] Patient Account Number: [id number] Date of Birth/Sex: June 04, 1932 (87 y.o. F) Treating RN: Donnamarie Poag Primary Care Trayvon Trumbull: Delsa Grana Other Clinician: Referring Rafael Salway: Laurene Footman Treating Syana Degraffenreid/Extender: Yaakov Guthrie in Treatment: 0 Allergies Active Allergies penicillin Severity: Moderate Levaquin Severity: Moderate Sulfa (Sulfonamide Antibiotics) Severity: Moderate Indocin Augmentin ACE Inhibitors Allergy Notes Electronic Signature(s) Signed: 09/22/2021 1:20:37 PM By: Donnamarie Poag Entered By: Donnamarie Poag on 09/22/2021 10:22:42 Caitlin Jenkins ([id number]) -------------------------------------------------------------------------------- Arrival Information Details Patient Name: Caitlin Jenkins Date of Service: 09/22/2021 10:00 AM Medical Record Number: [id number] Patient Account Number: [id number] Date of Birth/Sex: 16-Nov-1931 (87 y.o. F) Treating RN: Donnamarie Poag Primary Care Kimya Mccahill: Delsa Grana Other Clinician: Referring Macdonald Rigor: Laurene Footman Treating Alisah Grandberry/Extender: Yaakov Guthrie in Treatment: 0 Visit Information Patient Arrived: Ambulatory Arrival Time: 10:17 Accompanied By: daughter Transfer Assistance: None Patient Identification Verified: Yes Secondary Verification Process Completed: Yes Patient Requires Transmission-Based Precautions: No Patient Has Alerts: Yes Patient Alerts: NOT diabetic Electronic Signature(s) Signed: 09/22/2021 1:20:37 PM By: Donnamarie Poag Entered By: Donnamarie Poag on 09/22/2021 10:17:35 Caitlin Jenkins ([id number]) -------------------------------------------------------------------------------- Clinic Level of Care Assessment Details Patient Name: Caitlin Jenkins Date of Service: 09/22/2021 10:00  AM Medical Record Number: [id number] Patient Account Number: [id number] Date of Birth/Sex: 1932/08/16 (87 y.o. F) Treating RN: Donnamarie Poag Primary Care Nicolette Gieske: Delsa Grana Other Clinician: Referring Marua Qin: Laurene Footman Treating Ephriam Turman/Extender: Yaakov Guthrie in Treatment: 0 Clinic Level of Care Assessment Items TOOL 1 Quantity Score X - Use when EandM and Procedure is performed on INITIAL visit 1 0 ASSESSMENTS - Nursing Assessment / Reassessment X - General Physical Exam (combine w/ comprehensive assessment (listed just below) when performed on new 1 20 pt. evals) X- 1 25 Comprehensive Assessment (HX, ROS, Risk Assessments, Wounds Hx, etc.) ASSESSMENTS - Wound and Skin Assessment / Reassessment _0  - Dermatologic / Skin Assessment (not related to wound area) 0 ASSESSMENTS - Ostomy and/or Continence Assessment and Care _1  - Incontinence Assessment and Management 0 _2  - 0 Ostomy Care Assessment and Management (repouching, etc.) PROCESS - Coordination of Care X - Simple Patient / Family Education for ongoing care 1 15 _3  - 0 Complex (extensive) Patient / Family Education for ongoing care X- 1 10 Staff obtains Programmer, systems, Records, Test Results / Process Orders _4  - 0 Staff telephones HHA, Nursing Homes / Clarify orders / etc _5  - 0 Routine Transfer to another Facility (non-emergent condition) _6  - 0 Routine Hospital Admission (non-emergent condition) X- 1 15 New Admissions / Biomedical engineer / Ordering NPWT, Apligraf, etc. _7  - 0 Emergency Hospital Admission (emergent condition) PROCESS - Special Needs _8  - Pediatric / Minor Patient Management 0 _9  - 0 Isolation Patient Management _10  - 0 Hearing / Language / Visual special needs _11  - 0 Assessment of Community assistance (transportation, D/C planning, etc.) _12  - 0 Additional assistance / Altered mentation _13  - 0 Support Surface(s) Assessment (bed, cushion, seat, etc.) INTERVENTIONS - Miscellaneous _14   - External ear exam 0 _15  - 0 Patient Transfer (multiple staff / Civil Service fast streamer / Similar devices) _16  - 0 Simple Staple / Suture removal (25 or less) _17  - 0 Complex Staple / Suture removal (26 or more) _18  - 0 Hypo/Hyperglycemic Management (do not check if billed separately) X- 1 15 Ankle / Brachial Index (ABI) - do not check if billed separately Has the patient been seen at  the hospital within the last three years: Yes Total Score: 100 Level Of Care: New/Established - Level 3 EDGAR, CORRIGAN. ([id number]) Electronic Signature(s) Signed: 09/22/2021 1:20:37 PM By: Donnamarie Poag Entered By: Donnamarie Poag on 09/22/2021 10:53:14 Caitlin Jenkins ([id number]) -------------------------------------------------------------------------------- Encounter Discharge Information Details Patient Name: Caitlin Jenkins Date of Service: 09/22/2021 10:00 AM Medical Record Number: [id number] Patient Account Number: [id number] Date of Birth/Sex: Feb 12, 1932 (87 y.o. F) Treating RN: Donnamarie Poag Primary Care Hadden Steig: Delsa Grana Other Clinician: Referring Caden Fatica: Laurene Footman Treating Tereso Unangst/Extender: Yaakov Guthrie in Treatment: 0 Encounter Discharge Information Items Post Procedure Vitals Discharge Condition: Stable Temperature (F): 97.8 Ambulatory Status: Ambulatory Pulse (bpm): 85 Discharge Destination: Home Respiratory Rate (breaths/min): 16 Transportation: Private Auto Blood Pressure (mmHg): 188/101 Accompanied By: daughter Schedule Follow-up Appointment: Yes Clinical Summary of Care: Notes stated documents BP at home and follows with PCP Electronic Signature(s) Signed: 09/22/2021 1:20:37 PM By: Donnamarie Poag Entered ByDonnamarie Poag on 09/22/2021 11:08:09 Caitlin Jenkins ([id number]) -------------------------------------------------------------------------------- Lower Extremity Assessment Details Patient Name: Caitlin Jenkins Date of Service: 09/22/2021 10:00  AM Medical Record Number: [id number] Patient Account Number: [id number] Date of Birth/Sex: Nov 08, 1931 (87 y.o. F) Treating RN: Donnamarie Poag Primary Care Rochell Puett: Delsa Grana Other Clinician: Referring Daymon Hora: Laurene Footman Treating Anjoli Diemer/Extender: Yaakov Guthrie in Treatment: 0 Edema Assessment Assessed: [Left: No] [Right: Yes] Edema: [Left: N] [Right: o] Calf Left: Right: Point of Measurement: 30 cm From Medial Instep 32 cm Ankle Left: Right: Point of Measurement: 9 cm From Medial Instep 19.5 cm Knee To Floor Left: Right: From Medial Instep 40 cm Vascular Assessment Pulses: Dorsalis Pedis Palpable: [Right:Yes] Blood Pressure: Brachial: [Right:168] Ankle: [Right:Dorsalis Pedis: 160 0.95] Electronic Signature(s) Signed: 09/22/2021 1:20:37 PM By: Donnamarie Poag Entered ByDonnamarie Poag on 09/22/2021 10:38:03 Caitlin Jenkins ([id number]) -------------------------------------------------------------------------------- Multi Wound Chart Details Patient Name: Caitlin Jenkins Date of Service: 09/22/2021 10:00 AM Medical Record Number: [id number] Patient Account Number: [id number] Date of Birth/Sex: 06-25-1932 (87 y.o. F) Treating RN: Donnamarie Poag Primary Care Kayelee Herbig: Delsa Grana Other Clinician: Referring Hadas Jessop: Laurene Footman Treating Inella Kuwahara/Extender: Yaakov Guthrie in Treatment: 0 Vital Signs Height(in): 73 Pulse(bpm): 27 Weight(lbs): 150 Blood Pressure(mmHg): 188/101 Body Mass Index(BMI): 26 Temperature(F): 97.8 Respiratory Rate(breaths/min): 16 Photos: [N/A:N/A] Wound Location: Right, Anterior Lower Leg N/A N/A Wounding Event: Laceration N/A N/A Primary Etiology: Venous Leg Ulcer N/A N/A Secondary Etiology: Trauma, Other N/A N/A Comorbid History: Coronary Artery Disease, N/A N/A Hypertension, Cirrhosis , Gout Date Acquired: 08/27/2021 N/A N/A Weeks of Treatment: 0 N/A N/A Wound Status: Open N/A N/A Measurements L x W x D (cm)  1.4x1x0.1 N/A N/A Area (cm) : 1.1 N/A N/A Volume (cm) : 0.11 N/A N/A % Reduction in Area: 0.00% N/A N/A % Reduction in Volume: 0.00% N/A N/A Starting Position 1 (o'clock): 5 Ending Position 1 (o'clock): 6 Maximum Distance 1 (cm): 0.2 Undermining: Yes N/A N/A Classification: Full Thickness Without Exposed N/A N/A Support Structures Exudate Amount: Medium N/A N/A Exudate Type: Serosanguineous N/A N/A Exudate Color: red, brown N/A N/A Granulation Amount: Small (1-33%) N/A N/A Granulation Quality: Red, Pink N/A N/A Necrotic Amount: Large (67-100%) N/A N/A Necrotic Tissue: Eschar, Adherent Slough N/A N/A Exposed Structures: Fat Layer (Subcutaneous Tissue): N/A N/A Yes Fascia: No Tendon: No Muscle: No Joint: No Bone: No Debridement: Debridement - Excisional N/A N/A Pre-procedure Verification/Time 10:45 N/A N/A Out Taken: Pain Control: Lidocaine N/A N/A Tissue Debrided: Subcutaneous, Slough N/A N/A Level: Skin/Subcutaneous Tissue N/A N/A Debridement Area (sq cm): 1.4 N/A N/A Instrument:  Curette N/A N/A DILAN, FULLENWIDER ([id number]) Bleeding: Minimum N/A N/A Hemostasis Achieved: Pressure N/A N/A Debridement Treatment Procedure was tolerated well N/A N/A Response: Post Debridement 1.4x1x0.3 N/A N/A Measurements L x W x D (cm) Post Debridement Volume: 0.33 N/A N/A (cm) Procedures Performed: Debridement N/A N/A Treatment Notes Electronic Signature(s) Signed: 09/22/2021 11:09:34 AM By: Kalman Shan DO Entered By: Kalman Shan on 09/22/2021 11:01:44 Caitlin Jenkins ([id number]) -------------------------------------------------------------------------------- [place] Details Patient Name: Caitlin Jenkins Date of Service: 09/22/2021 10:00 AM Medical Record Number: [id number] Patient Account Number: [id number] Date of Birth/Sex: November 07, 1931 (87 y.o. F) Treating RN: Donnamarie Poag Primary Care Morris Longenecker: Delsa Grana Other  Clinician: Referring Antaniya Venuti: Laurene Footman Treating Macoy Rodwell/Extender: Yaakov Guthrie in Treatment: 0 Active Inactive Orientation to the Wound Care Program Nursing Diagnoses: Knowledge deficit related to the wound healing center program Goals: Patient/caregiver will verbalize understanding of the [place] Program Date Initiated: 09/22/2021 Target Resolution Date: 10/01/2021 Goal Status: Active Interventions: Provide education on orientation to the wound center Notes: Wound/Skin Impairment Nursing Diagnoses: Impaired tissue integrity Knowledge deficit related to smoking impact on wound healing Knowledge deficit related to ulceration/compromised skin integrity Goals: Patient/caregiver will verbalize understanding of skin care regimen Date Initiated: 09/22/2021 Target Resolution Date: 10/01/2021 Goal Status: Active Ulcer/skin breakdown will have a volume reduction of 30% by week 4 Date Initiated: 09/22/2021 Target Resolution Date: 10/20/2021 Goal Status: Active Ulcer/skin breakdown will have a volume reduction of 50% by week 8 Date Initiated: 09/22/2021 Target Resolution Date: 11/17/2021 Goal Status: Active Ulcer/skin breakdown will have a volume reduction of 80% by week 12 Date Initiated: 09/22/2021 Target Resolution Date: 12/08/2021 Goal Status: Active Ulcer/skin breakdown will heal within 14 weeks Date Initiated: 09/22/2021 Target Resolution Date: 12/22/2021 Goal Status: Active Interventions: Assess patient/caregiver ability to obtain necessary supplies Assess patient/caregiver ability to perform ulcer/skin care regimen upon admission and as needed Assess ulceration(s) every visit Notes: Electronic Signature(s) Signed: 09/22/2021 1:20:37 PM By: Donnamarie Poag Entered ByDonnamarie Poag on 09/22/2021 10:45:54 Caitlin Jenkins ([id number]) -------------------------------------------------------------------------------- Pain Assessment Details Patient  Name: Caitlin Jenkins Date of Service: 09/22/2021 10:00 AM Medical Record Number: [id number] Patient Account Number: [id number] Date of Birth/Sex: 11-Nov-1931 (87 y.o. F) Treating RN: Donnamarie Poag Primary Care Arlee Bossard: Delsa Grana Other Clinician: Referring Jamieon Lannen: Laurene Footman Treating Kirsti Mcalpine/Extender: Yaakov Guthrie in Treatment: 0 Active Problems Location of Pain Severity and Description of Pain Patient Has Paino No Site Locations Rate the pain. Current Pain Level: 0 Pain Management and Medication Current Pain Management: Electronic Signature(s) Signed: 09/22/2021 1:20:37 PM By: Donnamarie Poag Entered By: Donnamarie Poag on 09/22/2021 10:19:03 Caitlin Jenkins ([id number]) -------------------------------------------------------------------------------- Patient/Caregiver Education Details Patient Name: Caitlin Jenkins Date of Service: 09/22/2021 10:00 AM Medical Record Number: [id number] Patient Account Number: [id number] Date of Birth/Gender: 11-Nov-1931 (87 y.o. F) Treating RN: Donnamarie Poag Primary Care Physician: Delsa Grana Other Clinician: Referring Physician: Laurene Footman Treating Physician/Extender: Yaakov Guthrie in Treatment: 0 Education Assessment Education Provided To: Patient Education Topics Provided Basic Hygiene: Nutrition: Welcome To [facility]: Wound Debridement: Wound/Skin Impairment: Electronic Signature(s) Signed: 09/22/2021 1:20:37 PM By: Donnamarie Poag Entered By: Donnamarie Poag on 09/22/2021 11:03:21 Caitlin Jenkins ([id number]) -------------------------------------------------------------------------------- Wound Assessment Details Patient Name: Caitlin Jenkins Date of Service: 09/22/2021 10:00 AM Medical Record Number: [id number] Patient Account Number: [id number] Date of Birth/Sex: 08-27-1932 (87 y.o. F) Treating RN: Donnamarie Poag Primary Care Lovis More: Delsa Grana Other Clinician: Referring Koleman Marling:  Laurene Footman Treating Adrick Kestler/Extender: Yaakov Guthrie in Treatment:  0 Wound Status Wound Number: 1 Primary Etiology: Venous Leg Ulcer Wound Location: Right, Anterior Lower Leg Secondary Trauma, Other Etiology: Wounding Event: Laceration Wound Status: Open Date Acquired: 08/27/2021 Comorbid History: Coronary Artery Disease, Hypertension, Cirrhosis , Weeks Of Treatment: 0 Gout Clustered Wound: No Photos Wound Measurements Length: (cm) 1.4 Width: (cm) 1 Depth: (cm) 0.1 Area: (cm) 1.1 Volume: (cm) 0.11 % Reduction in Area: 0% % Reduction in Volume: 0% Tunneling: No Undermining: Yes Starting Position (o'clock): 5 Ending Position (o'clock): 6 Maximum Distance: (cm) 0.2 Wound Description Classification: Full Thickness Without Exposed Support Structu Exudate Amount: Medium Exudate Type: Serosanguineous Exudate Color: red, brown res Foul Odor After Cleansing: No Slough/Fibrino Yes Wound Bed Granulation Amount: Small (1-33%) Exposed Structure Granulation Quality: Red, Pink Fascia Exposed: No Necrotic Amount: Large (67-100%) Fat Layer (Subcutaneous Tissue) Exposed: Yes Necrotic Quality: Eschar, Adherent Slough Tendon Exposed: No Muscle Exposed: No Joint Exposed: No Bone Exposed: No Treatment Notes Wound #1 (Lower Leg) Wound Laterality: Right, Anterior Cleanser [place] Ancillary Kit - [address] [place], [place]. ([id number]) Discharge Instruction: Use supplies as instructed; Kit contains: (15) Saline Bullets; (15) 3x3 Gauze; 15 pr Gloves Normal Saline Discharge Instruction: Wash your hands with soap and water. Remove old dressing, discard into plastic bag and place into trash. Cleanse the wound with Normal Saline prior to applying a clean dressing using gauze sponges, not tissues or cotton balls. Do not scrub or use excessive force. Pat dry using gauze sponges, not tissue or cotton balls. Soap and Water Discharge Instruction: Gently cleanse wound with  antibacterial soap, rinse and pat dry prior to dressing wounds Peri-Wound Care Topical Primary Dressing Hydrofera Blue Ready Transfer Foam, 2.5x2.5 (in/in) Discharge Instruction: Apply Hydrofera Blue Ready to wound bed as directed Secondary Dressing Zetuvit Plus Silicone Border Dressing 4x4 (in/in) Secured With Compression Wrap Compression Stockings Add-Ons Electronic Signature(s) Signed: 09/22/2021 1:20:37 PM By: Donnamarie Poag Entered By: Donnamarie Poag on 09/22/2021 10:52:29 Caitlin Jenkins ([id number]) -------------------------------------------------------------------------------- [place] Details Patient Name: Caitlin Jenkins Date of Service: 09/22/2021 10:00 AM Medical Record Number: [id number] Patient Account Number: [id number] Date of Birth/Sex: Jul 13, 1932 (87 y.o. F) Treating RN: Donnamarie Poag Primary Care Krystie Leiter: Delsa Grana Other Clinician: Referring Kerry Odonohue: Laurene Footman Treating Esaiah Wanless/Extender: Yaakov Guthrie in Treatment: 0 Vital Signs Time Taken: 10:18 Temperature (F): 97.8 Height (in): 64 Pulse (bpm): 85 Source: Stated Respiratory Rate (breaths/min): 16 Weight (lbs): 150 Blood Pressure (mmHg): 188/101 Source: Measured Reference Range: 80 - 120 mg / dl Body Mass Index (BMI): 25.7 Notes stated BP runs high when rushing; follows with PCP; no c/o dizziness or h/a Electronic Signature(s) Signed: 09/22/2021 1:20:37 PM By: Donnamarie Poag Entered ByDonnamarie Poag on 09/22/2021 10:20:47

## 2021-09-22 NOTE — Progress Notes (Signed)
[name], [name] ([id number]) Visit Report for 09/22/2021 Abuse/Suicide Risk Screen Details Patient Name: Caitlin Jenkins Date of Service: 09/22/2021 10:00 AM Medical Record Number: [id number] Patient Account Number: [id number] Date of Birth/Sex: 15-Oct-1931 (87 y.o. F) Treating RN: Donnamarie Poag Primary Care Jaquaya Coyle: Delsa Grana Other Clinician: Referring Faysal Fenoglio: Laurene Footman Treating Dysen Edmondson/Extender: Yaakov Guthrie in Treatment: 0 Abuse/Suicide Risk Screen Items Answer ABUSE RISK SCREEN: Has anyone close to you tried to hurt or harm you recentlyo No Do you feel uncomfortable with anyone in your familyo No Has anyone forced you do things that you didnot want to doo No Electronic Signature(s) Signed: 09/22/2021 1:20:37 PM By: Donnamarie Poag Entered ByDonnamarie Poag on 09/22/2021 10:26:26 Caitlin Jenkins ([id number]) -------------------------------------------------------------------------------- Activities of Daily Living Details Patient Name: Caitlin Jenkins Date of Service: 09/22/2021 10:00 AM Medical Record Number: [id number] Patient Account Number: [id number] Date of Birth/Sex: April 26, 1932 (87 y.o. F) Treating RN: Donnamarie Poag Primary Care Merek Niu: Delsa Grana Other Clinician: Referring Jhoel Stieg: Laurene Footman Treating Kache Mcclurg/Extender: Yaakov Guthrie in Treatment: 0 Activities of Daily Living Items Answer Activities of Daily Living (Please select one for each item) Drive Automobile Completely Able Take Medications Completely Able Use Telephone Completely Able Care for Appearance Completely Able Use Toilet Completely Able Bath / Shower Completely Able Dress Self Completely Able Feed Self Completely Able Walk Completely Able Get In / Out Bed Completely Able Housework Completely Able Prepare Meals Completely [place] for Self Completely Able Electronic Signature(s) Signed: 09/22/2021 1:20:37 PM By: Donnamarie Poag Entered ByDonnamarie Poag on 09/22/2021 10:27:05 Caitlin Jenkins ([id number]) -------------------------------------------------------------------------------- Education Screening Details Patient Name: Caitlin Jenkins Date of Service: 09/22/2021 10:00 AM Medical Record Number: [id number] Patient Account Number: [id number] Date of Birth/Sex: [birth year] (87 y.o. F) Treating RN: Donnamarie Poag Primary Care Miette Molenda: Delsa Grana Other Clinician: Referring Chevy Sweigert: Laurene Footman Treating Esperansa Sarabia/Extender: Yaakov Guthrie in Treatment: 0 Primary Learner Assessed: Patient Learning Preferences/Education Level/Primary Language Learning Preference: Explanation Highest Education Level: College or Above Preferred Language: English Cognitive Barrier Language Barrier: No Translator Needed: No Memory Deficit: No Emotional Barrier: No Cultural/Religious Beliefs Affecting Medical Care: No Physical Barrier Impaired Vision: No Impaired Hearing: Yes Decreased Hand dexterity: No Knowledge/Comprehension Knowledge Level: High Comprehension Level: High Ability to understand written instructions: High Ability to understand verbal instructions: High Motivation Anxiety Level: Calm Cooperation: Cooperative Education Importance: Acknowledges Need Interest in Health Problems: Asks Questions Perception: Coherent Willingness to Engage in Self-Management High Activities: Readiness to Engage in Self-Management High Activities: Electronic Signature(s) Signed: 09/22/2021 1:20:37 PM By: Donnamarie Poag Entered ByDonnamarie Poag on 09/22/2021 10:27:36 Caitlin Jenkins ([id number]) -------------------------------------------------------------------------------- Fall Risk Assessment Details Patient Name: Caitlin Jenkins Date of Service: 09/22/2021 10:00 AM Medical Record Number: [id number] Patient Account Number: [id number] Date of Birth/Sex: 17-Jun-1932 (87 y.o. F) Treating RN: Donnamarie Poag Primary Care Jniya Madara: Delsa Grana Other Clinician: Referring Keisha Amer: Laurene Footman Treating Demitria Hay/Extender: Yaakov Guthrie in Treatment: 0 Fall Risk Assessment Items Have you had 2 or more falls in the last 12 monthso 0 Yes Have you had any fall that resulted in injury in the last 12 monthso 0 Yes FALLS RISK SCREEN History of falling - immediate or within 3 months 0 No Secondary diagnosis (Do you have 2 or more medical diagnoseso) 15 Yes Ambulatory aid None/bed rest/wheelchair/nurse 0 Yes Crutches/cane/walker 0 No Furniture 0 No Intravenous therapy Access/Saline/Heparin Lock 0 No Gait/Transferring Normal/ bed rest/ wheelchair 0 Yes Weak (short steps with or without shuffle,  stooped but able to lift head while walking, may 0 No seek support from furniture) Impaired (short steps with shuffle, may have difficulty arising from chair, head down, impaired 0 No balance) Mental Status Oriented to own ability 0 Yes Electronic Signature(s) Signed: 09/22/2021 1:20:37 PM By: Donnamarie Poag Entered By: Donnamarie Poag on 09/22/2021 10:27:49 Caitlin Jenkins ([id number]) -------------------------------------------------------------------------------- Foot Assessment Details Patient Name: Caitlin Jenkins Date of Service: 09/22/2021 10:00 AM Medical Record Number: [id number] Patient Account Number: [id number] Date of Birth/Sex: September 02, 1932 (87 y.o. F) Treating RN: Donnamarie Poag Primary Care Hayleen Clinkscales: Delsa Grana Other Clinician: Referring Sophonie Goforth: Laurene Footman Treating Marion Rosenberry/Extender: Yaakov Guthrie in Treatment: 0 Foot Assessment Items Site Locations + = Sensation present, - = Sensation absent, C = Callus, U = Ulcer R = Redness, W = Warmth, M = Maceration, PU = Pre-ulcerative lesion F = Fissure, S = Swelling, D = Dryness Assessment Right: Left: Other Deformity: No No Prior Foot Ulcer: No No Prior Amputation: No No Charcot Joint: No No Ambulatory  Status: Ambulatory Without Help Gait: Steady Electronic Signature(s) Signed: 09/22/2021 1:20:37 PM By: Donnamarie Poag Entered ByDonnamarie Poag on 09/22/2021 10:31:18 Caitlin Jenkins ([id number]) -------------------------------------------------------------------------------- Nutrition Risk Screening Details Patient Name: Caitlin Jenkins Date of Service: 09/22/2021 10:00 AM Medical Record Number: [id number] Patient Account Number: [id number] Date of Birth/Sex: 03/10/1932 (87 y.o. F) Treating RN: Donnamarie Poag Primary Care Enio Hornback: Delsa Grana Other Clinician: Referring Toa Mia: Laurene Footman Treating Monzerrath Mcburney/Extender: Yaakov Guthrie in Treatment: 0 Height (in): 64 Weight (lbs): 150 Body Mass Index (BMI): 25.7 Nutrition Risk Screening Items Score Screening NUTRITION RISK SCREEN: I have an illness or condition that made me change the kind and/or amount of food I eat 0 No I eat fewer than two meals per day 3 Yes I eat few fruits and vegetables, or milk products 2 Yes I have three or more drinks of beer, liquor or wine almost every day 0 No I have tooth or mouth problems that make it hard for me to eat 0 No I don't always have enough money to buy the food I need 0 No I eat alone most of the time 1 Yes I take three or more different prescribed or over-the-counter drugs a day 1 Yes Without wanting to, I have lost or gained 10 pounds in the last six months 0 No I am not always physically able to shop, cook and/or feed myself 0 No Nutrition Protocols Good Risk Protocol Moderate Risk Protocol 0 Provide education on nutrition High Risk Proctocol Risk Level: High Risk Score: 7 Electronic Signature(s) Signed: 09/22/2021 1:20:37 PM By: Donnamarie Poag Entered ByDonnamarie Poag on 09/22/2021 10:28:03

## 2021-09-28 DIAGNOSIS — L97812 Non-pressure chronic ulcer of other part of right lower leg with fat layer exposed: Secondary | ICD-10-CM | POA: Diagnosis not present

## 2021-09-29 ENCOUNTER — Encounter: Payer: PPO | Attending: Internal Medicine | Admitting: Internal Medicine

## 2021-09-29 ENCOUNTER — Other Ambulatory Visit: Payer: Self-pay

## 2021-09-29 DIAGNOSIS — L97812 Non-pressure chronic ulcer of other part of right lower leg with fat layer exposed: Secondary | ICD-10-CM | POA: Insufficient documentation

## 2021-09-29 DIAGNOSIS — I10 Essential (primary) hypertension: Secondary | ICD-10-CM | POA: Diagnosis not present

## 2021-09-29 DIAGNOSIS — X58XXXA Exposure to other specified factors, initial encounter: Secondary | ICD-10-CM | POA: Diagnosis not present

## 2021-09-29 DIAGNOSIS — E039 Hypothyroidism, unspecified: Secondary | ICD-10-CM | POA: Diagnosis not present

## 2021-09-29 DIAGNOSIS — K743 Primary biliary cirrhosis: Secondary | ICD-10-CM | POA: Insufficient documentation

## 2021-09-29 DIAGNOSIS — T798XXA Other early complications of trauma, initial encounter: Secondary | ICD-10-CM | POA: Insufficient documentation

## 2021-09-29 NOTE — Progress Notes (Signed)
CHONG, JANUARY ([id number]) Visit Report for 09/29/2021 Arrival Information Details Patient Name: Caitlin Jenkins Date of Service: 09/29/2021 11:15 AM Medical Record Number: [id number] Patient Account Number: [id number] Date of Birth/Sex: November 24, 1931 (89 y.o. F) Treating RN: Levora Dredge Primary Care Dabria Wadas: Delsa Grana Other Clinician: Referring Rajiv Parlato: Delsa Grana Treating Stesha Neyens/Extender: Yaakov Guthrie in Treatment: 1 Visit Information History Since Last Visit Added or deleted any medications: No Patient Arrived: Ambulatory Any new allergies or adverse reactions: No Arrival Time: 11:37 Had a fall or experienced change in No Accompanied By: daughter activities of daily living that may affect Transfer Assistance: None risk of falls: Patient Identification Verified: Yes Hospitalized since last visit: No Secondary Verification Process Completed: Yes Has Dressing in Place as Prescribed: Yes Patient Requires Transmission-Based Precautions: No Pain Present Now: No Patient Has Alerts: Yes Patient Alerts: NOT diabetic Electronic Signature(s) Signed: 09/29/2021 4:31:39 PM By: Levora Dredge Entered By: Levora Dredge on 09/29/2021 11:40:18 Caitlin Jenkins ([id number]) -------------------------------------------------------------------------------- Encounter Discharge Information Details Patient Name: Caitlin Jenkins Date of Service: 09/29/2021 11:15 AM Medical Record Number: [id number] Patient Account Number: [id number] Date of Birth/Sex: 10/30/1931 (89 y.o. F) Treating RN: Levora Dredge Primary Care Darryl Blumenstein: Delsa Grana Other Clinician: Referring Jacklynn Dehaas: Delsa Grana Treating Mirza Kidney/Extender: Yaakov Guthrie in Treatment: 1 Encounter Discharge Information Items Post Procedure Vitals Discharge Condition: Stable Temperature (F): 97.8 Ambulatory Status: Ambulatory Pulse (bpm): 76 Discharge Destination: Home Respiratory Rate  (breaths/min): 18 Transportation: Private Auto Blood Pressure (mmHg): 168/100 Accompanied By: daughter Schedule Follow-up Appointment: Yes Clinical Summary of Care: Notes MD Heber Deferiet made aware of increased BP Electronic Signature(s) Signed: 09/29/2021 4:31:39 PM By: Levora Dredge Entered By: Levora Dredge on 09/29/2021 12:15:31 Caitlin Jenkins ([id number]) -------------------------------------------------------------------------------- Lower Extremity Assessment Details Patient Name: Caitlin Jenkins Date of Service: 09/29/2021 11:15 AM Medical Record Number: [id number] Patient Account Number: [id number] Date of Birth/Sex: 1932-08-12 (89 y.o. F) Treating RN: Levora Dredge Primary Care Marisue Canion: Delsa Grana Other Clinician: Referring Demani Weyrauch: Delsa Grana Treating Viona Hosking/Extender: Yaakov Guthrie in Treatment: 1 Edema Assessment Assessed: [Left: No] [Right: No] Edema: [Left: N] [Right: o] Calf Left: Right: Point of Measurement: 30 cm From Medial Instep 32.4 cm Ankle Left: Right: Point of Measurement: 9 cm From Medial Instep 19.8 cm Vascular Assessment Pulses: Dorsalis Pedis Palpable: [Right:Yes] Electronic Signature(s) Signed: 09/29/2021 4:31:39 PM By: Levora Dredge Entered By: Levora Dredge on 09/29/2021 11:49:59 Caitlin Jenkins ([id number]) -------------------------------------------------------------------------------- Multi Wound Chart Details Patient Name: Caitlin Jenkins Date of Service: 09/29/2021 11:15 AM Medical Record Number: [id number] Patient Account Number: [id number] Date of Birth/Sex: 1931-12-12 (89 y.o. F) Treating RN: Levora Dredge Primary Care Rodderick Holtzer: Delsa Grana Other Clinician: Referring Teylor Wolven: Delsa Grana Treating Curtina Grills/Extender: Yaakov Guthrie in Treatment: 1 Vital Signs Height(in): 60 Pulse(bpm): 30 Weight(lbs): 150 Blood Pressure(mmHg): 168/100 Body Mass Index(BMI): 26 Temperature(F):  97.8 Respiratory Rate(breaths/min): 18 Photos: [N/A:N/A] Wound Location: Right, Anterior Lower Leg N/A N/A Wounding Event: Laceration N/A N/A Primary Etiology: Venous Leg Ulcer N/A N/A Secondary Etiology: Trauma, Other N/A N/A Comorbid History: Coronary Artery Disease, N/A N/A Hypertension, Cirrhosis , Gout Date Acquired: 08/27/2021 N/A N/A Weeks of Treatment: 1 N/A N/A Wound Status: Open N/A N/A Measurements L x W x D (cm) 1.5x1x0.2 N/A N/A Area (cm) : 1.178 N/A N/A Volume (cm) : 0.236 N/A N/A % Reduction in Area: -7.10% N/A N/A % Reduction in Volume: -114.50% N/A N/A Classification: Full Thickness Without Exposed N/A N/A Support Structures Exudate Amount: Medium N/A N/A Exudate Type: Serosanguineous N/A N/A Exudate Color:  red, brown N/A N/A Granulation Amount: Large (67-100%) N/A N/A Granulation Quality: Red, Pink N/A N/A Necrotic Amount: Small (1-33%) N/A N/A Necrotic Tissue: Eschar, Adherent Slough N/A N/A Exposed Structures: Fat Layer (Subcutaneous Tissue): N/A N/A Yes Fascia: No Tendon: No Muscle: No Joint: No Bone: No Epithelialization: None N/A N/A Treatment Notes Electronic Signature(s) Signed: 09/29/2021 4:31:39 PM By: Levora Dredge Entered By: Levora Dredge on 09/29/2021 11:55:11 Caitlin Jenkins ([id number]) -------------------------------------------------------------------------------- Multi-Disciplinary Care Plan Details Patient Name: Caitlin Jenkins Date of Service: 09/29/2021 11:15 AM Medical Record Number: [id number] Patient Account Number: [id number] Date of Birth/Sex: 1932-09-06 (89 y.o. F) Treating RN: Levora Dredge Primary Care Breshay Ilg: Delsa Grana Other Clinician: Referring Darragh Nay: Delsa Grana Treating Teauna Dubach/Extender: Yaakov Guthrie in Treatment: 1 Active Inactive Orientation to the Wound Care Program Nursing Diagnoses: Knowledge deficit related to the wound healing center program Goals: Patient/caregiver will  verbalize understanding of the [place] Program Date Initiated: 09/22/2021 Target Resolution Date: 10/01/2021 Goal Status: Active Interventions: Provide education on orientation to the wound center Notes: Wound/Skin Impairment Nursing Diagnoses: Impaired tissue integrity Knowledge deficit related to smoking impact on wound healing Knowledge deficit related to ulceration/compromised skin integrity Goals: Patient/caregiver will verbalize understanding of skin care regimen Date Initiated: 09/22/2021 Target Resolution Date: 10/01/2021 Goal Status: Active Ulcer/skin breakdown will have a volume reduction of 30% by week 4 Date Initiated: 09/22/2021 Target Resolution Date: 10/20/2021 Goal Status: Active Ulcer/skin breakdown will have a volume reduction of 50% by week 8 Date Initiated: 09/22/2021 Target Resolution Date: 11/17/2021 Goal Status: Active Ulcer/skin breakdown will have a volume reduction of 80% by week 12 Date Initiated: 09/22/2021 Target Resolution Date: 12/08/2021 Goal Status: Active Ulcer/skin breakdown will heal within 14 weeks Date Initiated: 09/22/2021 Target Resolution Date: 12/22/2021 Goal Status: Active Interventions: Assess patient/caregiver ability to obtain necessary supplies Assess patient/caregiver ability to perform ulcer/skin care regimen upon admission and as needed Assess ulceration(s) every visit Notes: Electronic Signature(s) Signed: 09/29/2021 4:31:39 PM By: Levora Dredge Entered By: Levora Dredge on 09/29/2021 11:54:56 Caitlin Jenkins ([id number]) -------------------------------------------------------------------------------- Pain Assessment Details Patient Name: Caitlin Jenkins Date of Service: 09/29/2021 11:15 AM Medical Record Number: [id number] Patient Account Number: [id number] Date of Birth/Sex: 01/06/1932 (89 y.o. F) Treating RN: Levora Dredge Primary Care Windsor Goeken: Delsa Grana Other Clinician: Referring Olis Viverette: Delsa Grana Treating Adalai Perl/Extender: Yaakov Guthrie in Treatment: 1 Active Problems Location of Pain Severity and Description of Pain Patient Has Paino No Site Locations Rate the pain. Current Pain Level: 0 Pain Management and Medication Current Pain Management: Electronic Signature(s) Signed: 09/29/2021 4:31:39 PM By: Levora Dredge Entered By: Levora Dredge on 09/29/2021 11:42:58 Caitlin Jenkins ([id number]) -------------------------------------------------------------------------------- Patient/Caregiver Education Details Patient Name: Caitlin Jenkins Date of Service: 09/29/2021 11:15 AM Medical Record Number: [id number] Patient Account Number: [id number] Date of Birth/Gender: Jun 12, 1932 (89 y.o. F) Treating RN: Levora Dredge Primary Care Physician: Delsa Grana Other Clinician: Referring Physician: Delsa Grana Treating Physician/Extender: Yaakov Guthrie in Treatment: 1 Education Assessment Education Provided To: Patient and Caregiver Education Topics Provided Wound/Skin Impairment: Handouts: Caring for Your Ulcer Methods: Explain/Verbal Responses: State content correctly Electronic Signature(s) Signed: 09/29/2021 4:31:39 PM By: Levora Dredge Entered By: Levora Dredge on 09/29/2021 12:14:23 Caitlin Jenkins ([id number]) -------------------------------------------------------------------------------- Wound Assessment Details Patient Name: Caitlin Jenkins Date of Service: 09/29/2021 11:15 AM Medical Record Number: [id number] Patient Account Number: [id number] Date of Birth/Sex: 1932-04-02 (89 y.o. F) Treating RN: Levora Dredge Primary Care Addyson Traub: Delsa Grana Other Clinician: Referring Saahas Hidrogo: Delsa Grana Treating Paislyn Domenico/Extender: Kalman Shan  Weeks in Treatment: 1 Wound Status Wound Number: 1 Primary Etiology: Venous Leg Ulcer Wound Location: Right, Anterior Lower Leg Secondary Trauma, Other Etiology: Wounding Event:  Laceration Wound Status: Open Date Acquired: 08/27/2021 Comorbid History: Coronary Artery Disease, Hypertension, Cirrhosis , Weeks Of Treatment: 1 Gout Clustered Wound: No Photos Wound Measurements Length: (cm) 1.5 Width: (cm) 1 Depth: (cm) 0.2 Area: (cm) 1.178 Volume: (cm) 0.236 % Reduction in Area: -7.1% % Reduction in Volume: -114.5% Epithelialization: None Tunneling: No Undermining: No Wound Description Classification: Full Thickness Without Exposed Support Structures Exudate Amount: Medium Exudate Type: Serosanguineous Exudate Color: red, brown Foul Odor After Cleansing: No Slough/Fibrino Yes Wound Bed Granulation Amount: Large (67-100%) Exposed Structure Granulation Quality: Red, Pink Fascia Exposed: No Necrotic Amount: Small (1-33%) Fat Layer (Subcutaneous Tissue) Exposed: Yes Necrotic Quality: Eschar, Adherent Slough Tendon Exposed: No Muscle Exposed: No Joint Exposed: No Bone Exposed: No Treatment Notes Wound #1 (Lower Leg) Wound Laterality: Right, Anterior Cleanser [place] Ancillary Kit - 15 Day Supply Discharge Instruction: Use supplies as instructed; Kit contains: (15) Saline Bullets; (15) 3x3 Gauze; 15 pr Gloves Normal Saline Discharge Instruction: Wash your hands with soap and water. Remove old dressing, discard into plastic bag and place into trash. Cleanse the wound with Normal Saline prior to applying a clean dressing using gauze sponges, not tissues or cotton balls. Do not DELLIE, PIASECKI. ([id number]) scrub or use excessive force. Pat dry using gauze sponges, not tissue or cotton balls. Soap and Water Discharge Instruction: Gently cleanse wound with antibacterial soap, rinse and pat dry prior to dressing wounds Peri-Wound Care Skin Prep Discharge Instruction: Use skin prep as directed Topical Primary Dressing Hydrofera Blue Ready Transfer Foam, 2.5x2.5 (in/in) Discharge Instruction: Apply Hydrofera Blue Ready to wound bed as  directed Secondary Dressing Zetuvit Plus Silicone Border Dressing 4x4 (in/in) Secured With 38M Medipore H Soft Cloth Surgical Tape, 2x2 (in/yd) Kerlix Roll Sterile or Non-Sterile 6-ply 4.5x4 (yd/yd) Discharge Instruction: Apply Kerlix as directed Compression Wrap Compression Stockings Add-Ons Electronic Signature(s) Signed: 09/29/2021 4:31:39 PM By: Levora Dredge Entered By: Levora Dredge on 09/29/2021 11:48:46 Caitlin Jenkins ([id number]) -------------------------------------------------------------------------------- [place] Details Patient Name: Caitlin Jenkins Date of Service: 09/29/2021 11:15 AM Medical Record Number: [id number] Patient Account Number: [id number] Date of Birth/Sex: March 30, 1932 (89 y.o. F) Treating RN: Levora Dredge Primary Care Devaris Quirk: Delsa Grana Other Clinician: Referring Jaasia Viglione: Delsa Grana Treating Leeasia Secrist/Extender: Yaakov Guthrie in Treatment: 1 Vital Signs Time Taken: 11:40 Temperature (F): 97.8 Height (in): 64 Pulse (bpm): 76 Weight (lbs): 150 Respiratory Rate (breaths/min): 18 Body Mass Index (BMI): 25.7 Blood Pressure (mmHg): 168/100 Reference Range: 80 - 120 mg / dl Notes pt states asymptomatic to increased BP Electronic Signature(s) Signed: 09/29/2021 4:31:39 PM By: Levora Dredge Entered By: Levora Dredge on 09/29/2021 11:42:49

## 2021-09-29 NOTE — Progress Notes (Signed)
Caitlin, Jenkins ([id number]) Visit Report for 09/29/2021 Chief Complaint Document Details Patient Name: Caitlin Jenkins Date of Service: 09/29/2021 11:15 AM Medical Record Number: [id number] Patient Account Number: [id number] Date of Birth/Sex: [birth year] (89 y.o. F) Treating RN: Levora Dredge Primary Care Provider: Delsa Grana Other Clinician: Referring Provider: Delsa Grana Treating Provider/Extender: Yaakov Guthrie in Treatment: 1 Information Obtained from: Patient Chief Complaint Right lower extremity wound caused by trauma Electronic Signature(s) Signed: 09/29/2021 12:33:06 PM By: Kalman Shan DO Entered By: Kalman Shan on 09/29/2021 12:29:41 Caitlin Jenkins ([id number]) -------------------------------------------------------------------------------- Debridement Details Patient Name: Caitlin Jenkins Date of Service: 09/29/2021 11:15 AM Medical Record Number: [id number] Patient Account Number: [id number] Date of Birth/Sex: 1932-08-25 (89 y.o. F) Treating RN: Levora Dredge Primary Care Provider: Delsa Grana Other Clinician: Referring Provider: Delsa Grana Treating Provider/Extender: Yaakov Guthrie in Treatment: 1 Debridement Performed for Wound #1 Right,Anterior Lower Leg Assessment: Performed By: Physician Kalman Shan, MD Debridement Type: Debridement Severity of Tissue Pre Debridement: Fat layer exposed Level of Consciousness (Pre- Awake and Alert procedure): Pre-procedure Verification/Time Out Yes - 11:55 Taken: Pain Control: Lidocaine 4% Topical Solution Total Area Debrided (L x W): 1.5 (cm) x 1 (cm) = 1.5 (cm) Tissue and other material Viable, Non-Viable, Slough, Subcutaneous, Slough debrided: Level: Skin/Subcutaneous Tissue Debridement Description: Excisional Instrument: Curette Bleeding: Minimum Hemostasis Achieved: Pressure Response to Treatment: Procedure was tolerated well Level of Consciousness (Post- Awake  and Alert procedure): Post Debridement Measurements of Total Wound Length: (cm) 1.5 Width: (cm) 1 Depth: (cm) 0.2 Volume: (cm) 0.236 Character of Wound/Ulcer Post Debridement: Stable Severity of Tissue Post Debridement: Fat layer exposed Post Procedure Diagnosis Same as Pre-procedure Electronic Signature(s) Signed: 09/29/2021 12:33:06 PM By: Kalman Shan DO Signed: 09/29/2021 4:31:39 PM By: Levora Dredge Entered By: Levora Dredge on 09/29/2021 11:57:49 Caitlin Jenkins ([id number]) -------------------------------------------------------------------------------- HPI Details Patient Name: Caitlin Jenkins Date of Service: 09/29/2021 11:15 AM Medical Record Number: [id number] Patient Account Number: [id number] Date of Birth/Sex: 07/19/1932 (89 y.o. F) Treating RN: Levora Dredge Primary Care Provider: Delsa Grana Other Clinician: Referring Provider: Delsa Grana Treating Provider/Extender: Yaakov Guthrie in Treatment: 1 History of Present Illness HPI Description: Admission 09/22/2021 Ms. 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 She states she hit her leg against the car door. She was initially seen in the ED on 12/2 for this issue and had 3 sutures in place and started on doxycycline. She followed up in the ED on 12/14 and had sutures removed. She has been placing antibiotic ointment on the wound bed daily. She currently denies signs of infection. 1/4; patient presents for follow-up. She has been using Hydrofera Blue without issues. She denies signs of infection. Electronic Signature(s) Signed: 09/29/2021 12:33:06 PM By: Kalman Shan DO Entered By: Kalman Shan on 09/29/2021 12:30:03 Caitlin Jenkins  ([id number]) -------------------------------------------------------------------------------- Physical Exam Details Patient Name: Caitlin Jenkins Date of Service: 09/29/2021 11:15 AM Medical Record Number: [id number] Patient Account Number: [id number] Date of Birth/Sex: 04-18-1932 (89 y.o. F) Treating RN: Levora Dredge Primary Care Provider: Delsa Grana Other Clinician: Referring Provider: Delsa Grana Treating Provider/Extender: Yaakov Guthrie in Treatment: 1 Constitutional . Cardiovascular . Psychiatric . Notes Right lower extremity: To the anterior aspect there is an open wound with granulation tissue and nonviable tissue present. No signs of infection. Electronic  Signature(s) Signed: 09/29/2021 12:33:06 PM By: Kalman Shan DO Entered By: Kalman Shan on 09/29/2021 12:30:41 Caitlin Jenkins ([id number]) -------------------------------------------------------------------------------- Physician Orders Details Patient Name: Caitlin Jenkins Date of Service: 09/29/2021 11:15 AM Medical Record Number: [id number] Patient Account Number: [id number] Date of Birth/Sex: 1932-08-30 (89 y.o. F) Treating RN: Levora Dredge Primary Care Provider: Delsa Grana Other Clinician: Referring Provider: Delsa Grana Treating Provider/Extender: Yaakov Guthrie in Treatment: 1 Verbal / Phone Orders: No Diagnosis Coding Follow-up Appointments o Return Appointment in 1 week. o Nurse Visit as needed Bathing/ Shower/ Hygiene o Clean wound with Normal Saline or wound cleanser. - change dressing after shower or keep it dry o No tub bath. Anesthetic (Use 'Patient Medications' Section for Anesthetic Order Entry) o Lidocaine applied to wound bed Edema Control - Lymphedema / Segmental Compressive Device / Other o Elevate leg(s) parallel to the floor when sitting. o DO YOUR BEST to sleep in the bed at night. DO NOT sleep in your recliner. Long hours of sitting  in a recliner leads to swelling of the legs and/or potential wounds on your backside. Additional Orders / Instructions o Follow Nutritious Diet and Increase Protein Intake Wound Treatment Wound #1 - Lower Leg Wound Laterality: Right, Anterior Cleanser: Byram Ancillary Kit - 15 Day Supply (Generic) Every Other Day/30 Days Discharge Instructions: Use supplies as instructed; Kit contains: (15) Saline Bullets; (15) 3x3 Gauze; 15 pr Gloves Cleanser: Normal Saline (Generic) Every Other Day/30 Days Discharge Instructions: Wash your hands with soap and water. Remove old dressing, discard into plastic bag and place into trash. Cleanse the wound with Normal Saline prior to applying a clean dressing using gauze sponges, not tissues or cotton balls. Do not scrub or use excessive force. Pat dry using gauze sponges, not tissue or cotton balls. Cleanser: Soap and Water Every Other Day/30 Days Discharge Instructions: Gently cleanse wound with antibacterial soap, rinse and pat dry prior to dressing wounds Peri-Wound Care: Skin Prep (Generic) Every Other Day/30 Days Discharge Instructions: Use skin prep as directed Primary Dressing: Hydrofera Blue Ready Transfer Foam, 2.5x2.5 (in/in) Every Other Day/30 Days Discharge Instructions: Apply Hydrofera Blue Ready to wound bed as directed Secondary Dressing: Zetuvit Plus Silicone Border Dressing 4x4 (in/in) (Generic) Every Other Day/30 Days Secured With: 44M Medipore H Soft Cloth Surgical Tape, 2x2 (in/yd) (Generic) Every Other Day/30 Days Secured With: Kerlix Roll Sterile or Non-Sterile 6-ply 4.5x4 (yd/yd) (Generic) Every Other Day/30 Days Discharge Instructions: Apply Kerlix as directed Electronic Signature(s) Signed: 09/29/2021 12:33:06 PM By: Kalman Shan DO Entered By: Kalman Shan on 09/29/2021 12:32:14 Caitlin Jenkins ([id number]) -------------------------------------------------------------------------------- Problem List Details Patient Name:  Caitlin Jenkins Date of Service: 09/29/2021 11:15 AM Medical Record Number: [id number] Patient Account Number: [id number] Date of Birth/Sex: 1932/03/07 (89 y.o. F) Treating RN: Levora Dredge Primary Care Provider: Delsa Grana Other Clinician: Referring Provider: Delsa Grana Treating Provider/Extender: Yaakov Guthrie in Treatment: 1 Active Problems ICD-10 Encounter Code Description Active Date MDM Diagnosis [phone] Non-pressure chronic ulcer of other part of right lower leg with fat layer 09/22/2021 No Yes exposed E03.9 Hypothyroidism, unspecified 09/22/2021 No Yes I10 Essential (primary) hypertension 09/22/2021 No Yes K74.3 Primary biliary cirrhosis 09/22/2021 No Yes T79.8XXA Other early complications of trauma, initial encounter 09/22/2021 No Yes Inactive Problems Resolved Problems Electronic Signature(s) Signed: 09/29/2021 12:33:06 PM By: Kalman Shan DO Entered By: Kalman Shan on 09/29/2021 12:29:34 Caitlin Jenkins ([id number]) -------------------------------------------------------------------------------- Progress Note Details Patient Name: Caitlin Jenkins Date of Service: 09/29/2021 11:15 AM Medical Record Number: [id number]  Patient Account Number: [id number] Date of Birth/Sex: Oct 29, 1931 (89 y.o. F) Treating RN: Levora Dredge Primary Care Provider: Delsa Grana Other Clinician: Referring Provider: Delsa Grana Treating Provider/Extender: Yaakov Guthrie in Treatment: 1 Subjective Chief Complaint Information obtained from Patient Right lower extremity wound caused by trauma History of Present Illness (HPI) Admission 09/22/2021 Ms. 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 She states she hit her leg  against the car door. She was initially seen in the ED on 12/2 for this issue and had 3 sutures in place and started on doxycycline. She followed up in the ED on 12/14 and had sutures removed. She has been placing antibiotic ointment on the wound bed daily. She currently denies signs of infection. 1/4; patient presents for follow-up. She has been using Hydrofera Blue without issues. She denies signs of infection. Objective Constitutional Vitals Time Taken: 11:40 AM, Height: 64 in, Weight: 150 lbs, BMI: 25.7, Temperature: 97.8 F, Pulse: 76 bpm, Respiratory Rate: 18 breaths/min, Blood Pressure: 168/100 mmHg. General Notes: pt states asymptomatic to increased BP General Notes: Right lower extremity: To the anterior aspect there is an open wound with granulation tissue and nonviable tissue present. No signs of infection. Integumentary (Hair, Skin) Wound #1 status is Open. Original cause of wound was Laceration. The date acquired was: 08/27/2021. The wound has been in treatment 1 weeks. The wound is located on the Right,Anterior Lower Leg. The wound measures 1.5cm length x 1cm width x 0.2cm depth; 1.178cm^2 area and 0.236cm^3 volume. There is Fat Layer (Subcutaneous Tissue) exposed. There is no tunneling or undermining noted. There is a medium amount of serosanguineous drainage noted. There is large (67-100%) red, pink granulation within the wound bed. There is a small (1-33%) amount of necrotic tissue within the wound bed including Eschar and Adherent Slough. Assessment Active Problems ICD-10 Non-pressure chronic ulcer of other part of right lower leg with fat layer exposed Hypothyroidism, unspecified Essential (primary) hypertension Primary biliary cirrhosis Other early complications of trauma, initial encounter Patient's wound has shown improvement in appearance since last clinic visit. She has more granulation tissue present. No signs of infection on exam. I debrided nonviable tissue. I  recommended continuing Hydrofera Blue. Follow-up in 1 week. ANOKHI, SHANNON ([id number]) Procedures Wound #1 Pre-procedure diagnosis of Wound #1 is a Venous Leg Ulcer located on the Right,Anterior Lower Leg .Severity of Tissue Pre Debridement is: Fat layer exposed. There was a Excisional Skin/Subcutaneous Tissue Debridement with a total area of 1.5 sq cm performed by Kalman Shan, MD. With the following instrument(s): Curette to remove Viable and Non-Viable tissue/material. Material removed includes Subcutaneous Tissue and Slough and after achieving pain control using Lidocaine 4% Topical Solution. No specimens were taken. A time out was conducted at 11:55, prior to the start of the procedure. A Minimum amount of bleeding was controlled with Pressure. The procedure was tolerated well. Post Debridement Measurements: 1.5cm length x 1cm width x 0.2cm depth; 0.236cm^3 volume. Character of Wound/Ulcer Post Debridement is stable. Severity of Tissue Post Debridement is: Fat layer exposed. Post procedure Diagnosis Wound #1: Same as Pre-Procedure Plan Follow-up Appointments: Return Appointment in 1 week. Nurse Visit as needed Bathing/ Shower/ Hygiene: Clean wound with Normal Saline or wound cleanser. - change dressing after shower or keep it dry No tub bath. Anesthetic (Use 'Patient  Medications' Section for Anesthetic Order Entry): Lidocaine applied to wound bed Edema Control - Lymphedema / Segmental Compressive Device / Other: Elevate leg(s) parallel to the floor when sitting. DO YOUR BEST to sleep in the bed at night. DO NOT sleep in your recliner. Long hours of sitting in a recliner leads to swelling of the legs and/or potential wounds on your backside. Additional Orders / Instructions: Follow Nutritious Diet and Increase Protein Intake WOUND #1: - Lower Leg Wound Laterality: Right, Anterior Cleanser: Byram Ancillary Kit - 15 Day Supply (Generic) Every Other Day/30 Days Discharge  Instructions: Use supplies as instructed; Kit contains: (15) Saline Bullets; (15) 3x3 Gauze; 15 pr Gloves Cleanser: Normal Saline (Generic) Every Other Day/30 Days Discharge Instructions: Wash your hands with soap and water. Remove old dressing, discard into plastic bag and place into trash. Cleanse the wound with Normal Saline prior to applying a clean dressing using gauze sponges, not tissues or cotton balls. Do not scrub or use excessive force. Pat dry using gauze sponges, not tissue or cotton balls. Cleanser: Soap and Water Every Other Day/30 Days Discharge Instructions: Gently cleanse wound with antibacterial soap, rinse and pat dry prior to dressing wounds Peri-Wound Care: Skin Prep (Generic) Every Other Day/30 Days Discharge Instructions: Use skin prep as directed Primary Dressing: Hydrofera Blue Ready Transfer Foam, 2.5x2.5 (in/in) Every Other Day/30 Days Discharge Instructions: Apply Hydrofera Blue Ready to wound bed as directed Secondary Dressing: Zetuvit Plus Silicone Border Dressing 4x4 (in/in) (Generic) Every Other Day/30 Days Secured With: 22M Medipore H Soft Cloth Surgical Tape, 2x2 (in/yd) (Generic) Every Other Day/30 Days Secured With: Kerlix Roll Sterile or Non-Sterile 6-ply 4.5x4 (yd/yd) (Generic) Every Other Day/30 Days Discharge Instructions: Apply Kerlix as directed 1. In office sharp debridement 2. Hydrofera Blue 3. Follow-up in 1 week Electronic Signature(s) Signed: 09/29/2021 12:33:06 PM By: Kalman Shan DO Entered By: Kalman Shan on 09/29/2021 12:31:37 Caitlin Jenkins ([id number]) -------------------------------------------------------------------------------- [place] Details Patient Name: Caitlin Jenkins Date of Service: 09/29/2021 Medical Record Number: [id number] Patient Account Number: [id number] Date of Birth/Sex: 05-12-1932 (89 y.o. F) Treating RN: Levora Dredge Primary Care Provider: Delsa Grana Other Clinician: Referring Provider: Delsa Grana Treating Provider/Extender: Yaakov Guthrie in Treatment: 1 Diagnosis Coding ICD-10 Codes Code Description [phone] Non-pressure chronic ulcer of other part of right lower leg with fat layer exposed E03.9 Hypothyroidism, unspecified I10 Essential (primary) hypertension K74.3 Primary biliary cirrhosis T79.8XXA Other early complications of trauma, initial encounter Facility Procedures CPT4 Code: [id number] Description: 95188 - DEB SUBQ TISSUE 20 SQ CM/< Modifier: Quantity: 1 CPT4 Code: Description: ICD-10 Diagnosis Description C16.606 Non-pressure chronic ulcer of other part of right lower leg with fat lay Modifier: er exposed Quantity: Physician Procedures CPT4 Code: [id number] Description: 11042 - WC PHYS SUBQ TISS 20 SQ CM Modifier: Quantity: 1 CPT4 Code: Description: ICD-10 Diagnosis Description X32.355 Non-pressure chronic ulcer of other part of right lower leg with fat lay Modifier: er exposed Quantity: Electronic Signature(s) Signed: 09/29/2021 12:33:06 PM By: Kalman Shan DO Entered By: Kalman Shan on 09/29/2021 12:31:56

## 2021-10-06 ENCOUNTER — Other Ambulatory Visit: Payer: Self-pay

## 2021-10-06 ENCOUNTER — Encounter (HOSPITAL_BASED_OUTPATIENT_CLINIC_OR_DEPARTMENT_OTHER): Payer: PPO | Admitting: Internal Medicine

## 2021-10-06 DIAGNOSIS — T798XXA Other early complications of trauma, initial encounter: Secondary | ICD-10-CM

## 2021-10-06 DIAGNOSIS — I10 Essential (primary) hypertension: Secondary | ICD-10-CM | POA: Diagnosis not present

## 2021-10-06 DIAGNOSIS — L97812 Non-pressure chronic ulcer of other part of right lower leg with fat layer exposed: Secondary | ICD-10-CM | POA: Diagnosis not present

## 2021-10-06 DIAGNOSIS — E039 Hypothyroidism, unspecified: Secondary | ICD-10-CM | POA: Diagnosis not present

## 2021-10-06 NOTE — Progress Notes (Addendum)
Caitlin Jenkins ([id number]) Visit Report for 10/06/2021 Chief Complaint Document Details Patient Name: Caitlin Jenkins Date of Service: 10/06/2021 9:00 AM Medical Record Number: [id number] Patient Account Number: [id number] Date of Birth/Sex: [birth year] (88 y.o. F) Treating RN: Levora Dredge Primary Care Provider: Delsa Grana Other Clinician: Referring Provider: Delsa Grana Treating Provider/Extender: Yaakov Guthrie in Treatment: 2 Information Obtained from: Patient Chief Complaint Right lower extremity wound caused by trauma Electronic Signature(s) Signed: 10/06/2021 9:54:38 AM By: Kalman Shan DO Entered By: Kalman Shan on 10/06/2021 09:50:50 Caitlin Jenkins ([id number]) -------------------------------------------------------------------------------- HPI Details Patient Name: Caitlin Jenkins Date of Service: 10/06/2021 9:00 AM Medical Record Number: [id number] Patient Account Number: [id number] Date of Birth/Sex: 08/08/1932 (88 y.o. F) Treating RN: Levora Dredge Primary Care Provider: Delsa Grana Other Clinician: Referring Provider: Delsa Grana Treating Provider/Extender: Yaakov Guthrie in Treatment: 2 History of Present Illness HPI Description: Admission 09/22/2021 Ms. 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 She states she hit her leg against the car door. She was initially seen in the ED on 12/2 for this issue and had 3 sutures in place and started on doxycycline. She followed up in the ED on 12/14 and had sutures removed. She has been placing antibiotic ointment on the wound bed daily. She currently denies signs of infection. 1/4; patient presents for follow-up. She has been using Hydrofera Blue  without issues. She denies signs of infection. 1/11; patient presents for follow-up. She has been using Hydrofera Blue to the wound bed. She has no issues or complaints today and denies signs of infection. Electronic Signature(s) Signed: 10/06/2021 9:54:38 AM By: Kalman Shan DO Entered By: Kalman Shan on 10/06/2021 09:51:10 Caitlin Jenkins ([id number]) -------------------------------------------------------------------------------- Physical Exam Details Patient Name: Caitlin Jenkins Date of Service: 10/06/2021 9:00 AM Medical Record Number: [id number] Patient Account Number: [id number] Date of Birth/Sex: [birth year] (88 y.o. F) Treating RN: Levora Dredge Primary Care Provider: Delsa Grana Other Clinician: Referring Provider: Delsa Grana Treating Provider/Extender: Yaakov Guthrie in Treatment: 2 Constitutional . Cardiovascular . Psychiatric . Notes Right lower extremity: To the anterior aspect there is an open wound with granulation tissue throughout. There is undermining to the 10:00 and 1 o'clock position however this is minimal. No signs of infection. Electronic Signature(s) Signed: 10/06/2021 9:54:38 AM By: Kalman Shan DO Entered By: Kalman Shan on 10/06/2021 09:51:50 Caitlin Jenkins ([id number]) -------------------------------------------------------------------------------- Physician Orders Details Patient Name: Caitlin Jenkins Date of Service: 10/06/2021 9:00 AM Medical Record Number: [id number] Patient Account Number: [id number] Date of Birth/Sex: 15-Sep-1932 (88 y.o. F) Treating RN: Levora Dredge Primary Care Provider: Delsa Grana Other Clinician: Referring Provider: Delsa Grana Treating Provider/Extender: Yaakov Guthrie in Treatment: 2 Verbal / Phone Orders: No Diagnosis Coding Follow-up Appointments o Return Appointment in 2 weeks. o Nurse Visit as needed Bathing/ Shower/ Hygiene o Clean wound with  Normal Saline or wound cleanser. - change dressing after shower or keep it dry o No tub bath. Anesthetic (Use 'Patient Medications' Section for Anesthetic Order Entry) o Lidocaine applied to wound bed Edema Control - Lymphedema / Segmental Compressive Device / Other o Elevate leg(s) parallel to the floor when sitting. o DO YOUR BEST to sleep in the bed at night. DO NOT sleep in your recliner. Long hours of sitting in a recliner leads to swelling  of the legs and/or potential wounds on your backside. Additional Orders / Instructions o Follow Nutritious Diet and Increase Protein Intake Wound Treatment Wound #1 - Lower Leg Wound Laterality: Right, Anterior Cleanser: Normal Saline (Generic) Every Other Day/30 Days Discharge Instructions: Wash your hands with soap and water. Remove old dressing, discard into plastic bag and place into trash. Cleanse the wound with Normal Saline prior to applying a clean dressing using gauze sponges, not tissues or cotton balls. Do not scrub or use excessive force. Pat dry using gauze sponges, not tissue or cotton balls. Cleanser: Soap and Water Every Other Day/30 Days Discharge Instructions: Gently cleanse wound with antibacterial soap, rinse and pat dry prior to dressing wounds Peri-Wound Care: Skin Prep (Generic) Every Other Day/30 Days Discharge Instructions: Use skin prep as directed Peri-Wound Care: UNI-SOLVE Adhesive Remover Wipes, 2.25x2 (in/in) Every Other Day/30 Days Primary Dressing: Hydrofera Blue Ready Transfer Foam, 2.5x2.5 (in/in) Every Other Day/30 Days Discharge Instructions: Apply Hydrofera Blue Ready to wound bed as directed Secondary Dressing: Zetuvit Plus Silicone Border Dressing 4x4 (in/in) (Generic) Every Other Day/30 Days Secured With: 42M Medipore H Soft Cloth Surgical Tape, 2x2 (in/yd) (Generic) Every Other Day/30 Days Discharge Instructions: optional for pt is needed to secure dressing Secured With: Kerlix Roll Sterile or  Non-Sterile 6-ply 4.5x4 (yd/yd) (Generic) Every Other Day/30 Days Discharge Instructions: Apply Kerlix as directed optional for patient if needs to use to secure dressing Electronic Signature(s) Signed: 10/06/2021 2:21:45 PM By: Kalman Shan DO Signed: 10/07/2021 12:48:18 PM By: Levora Dredge Previous Signature: 10/06/2021 9:54:38 AM Version By: Kalman Shan DO Entered By: Levora Dredge on 10/06/2021 14:21:21 Caitlin Jenkins ([id number]) -------------------------------------------------------------------------------- Problem List Details Patient Name: Caitlin Jenkins Date of Service: 10/06/2021 9:00 AM Medical Record Number: [id number] Patient Account Number: [id number] Date of Birth/Sex: 08-19-1932 (88 y.o. F) Treating RN: Levora Dredge Primary Care Provider: Delsa Grana Other Clinician: Referring Provider: Delsa Grana Treating Provider/Extender: Yaakov Guthrie in Treatment: 2 Active Problems ICD-10 Encounter Code Description Active Date MDM Diagnosis [phone] Non-pressure chronic ulcer of other part of right lower leg with fat layer 09/22/2021 No Yes exposed E03.9 Hypothyroidism, unspecified 09/22/2021 No Yes I10 Essential (primary) hypertension 09/22/2021 No Yes K74.3 Primary biliary cirrhosis 09/22/2021 No Yes T79.8XXA Other early complications of trauma, initial encounter 09/22/2021 No Yes Inactive Problems Resolved Problems Electronic Signature(s) Signed: 10/06/2021 9:54:38 AM By: Kalman Shan DO Entered By: Kalman Shan on 10/06/2021 09:50:45 Veasey, Lillette Boxer ([id number]) -------------------------------------------------------------------------------- Progress Note Details Patient Name: Caitlin Jenkins Date of Service: 10/06/2021 9:00 AM Medical Record Number: [id number] Patient Account Number: [id number] Date of Birth/Sex: 14-Jul-1932 (88 y.o. F) Treating RN: Levora Dredge Primary Care Provider: Delsa Grana Other  Clinician: Referring Provider: Delsa Grana Treating Provider/Extender: Yaakov Guthrie in Treatment: 2 Subjective Chief Complaint Information obtained from Patient Right lower extremity wound caused by trauma History of Present Illness (HPI) Admission 09/22/2021 Ms. 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 She states she hit her leg against the car door. She was initially seen in the ED on 12/2 for this issue and had 3 sutures in place and started on doxycycline. She followed up in the ED on 12/14 and had sutures removed. She has been placing antibiotic ointment on the wound bed daily. She currently denies signs of infection. 1/4; patient  presents for follow-up. She has been using Hydrofera Blue without issues. She denies signs of infection. 1/11; patient presents for follow-up. She has been using Hydrofera Blue to the wound bed. She has no issues or complaints today and denies signs of infection. Objective Constitutional Vitals Time Taken: 9:03 AM, Height: 64 in, Weight: 150 lbs, BMI: 25.7, Temperature: 97.4 F, Pulse: 81 bpm, Respiratory Rate: 18 breaths/min, Blood Pressure: 168/88 mmHg. General Notes: Right lower extremity: To the anterior aspect there is an open wound with granulation tissue throughout. There is undermining to the 10:00 and 1 o'clock position however this is minimal. No signs of infection. Integumentary (Hair, Skin) Wound #1 status is Open. Original cause of wound was Laceration. The date acquired was: 08/27/2021. The wound has been in treatment 2 weeks. The wound is located on the Right,Anterior Lower Leg. The wound measures 1.2cm length x 0.9cm width x 0.2cm depth; 0.848cm^2 area and 0.17cm^3 volume. There is Fat Layer  (Subcutaneous Tissue) exposed. There is no tunneling or undermining noted. There is a medium amount of serosanguineous drainage noted. There is large (67-100%) red, pink granulation within the wound bed. There is a small (1-33%) amount of necrotic tissue within the wound bed including Adherent Slough. Assessment Active Problems ICD-10 Non-pressure chronic ulcer of other part of right lower leg with fat layer exposed Hypothyroidism, unspecified Essential (primary) hypertension Primary biliary cirrhosis Other early complications of trauma, initial encounter HAJAR, PENNINGER. ([id number]) Patient's wound has shown improvement in size and appearance since last clinic visit. No signs of infection on exam. There is no need for debridement today. At this time I recommended continuing Hydrofera Blue with dressing changes. Follow-up in 2 weeks Plan Follow-up Appointments: Return Appointment in 2 weeks. Nurse Visit as needed Bathing/ Shower/ Hygiene: Clean wound with Normal Saline or wound cleanser. - change dressing after shower or keep it dry No tub bath. Anesthetic (Use 'Patient Medications' Section for Anesthetic Order Entry): Lidocaine applied to wound bed Edema Control - Lymphedema / Segmental Compressive Device / Other: Elevate leg(s) parallel to the floor when sitting. DO YOUR BEST to sleep in the bed at night. DO NOT sleep in your recliner. Long hours of sitting in a recliner leads to swelling of the legs and/or potential wounds on your backside. Additional Orders / Instructions: Follow Nutritious Diet and Increase Protein Intake WOUND #1: - Lower Leg Wound Laterality: Right, Anterior Cleanser: [place] Ancillary Kit - 15 Day Supply (Generic) Every Other Day/30 Days Discharge Instructions: Use supplies as instructed; Kit contains: (15) Saline Bullets; (15) 3x3 Gauze; 15 pr Gloves Cleanser: Normal Saline (Generic) Every Other Day/30 Days Discharge Instructions: Wash your hands with  soap and water. Remove old dressing, discard into plastic bag and place into trash. Cleanse the wound with Normal Saline prior to applying a clean dressing using gauze sponges, not tissues or cotton balls. Do not scrub or use excessive force. Pat dry using gauze sponges, not tissue or cotton balls. Cleanser: Soap and Water Every Other Day/30 Days Discharge Instructions: Gently cleanse wound with antibacterial soap, rinse and pat dry prior to dressing wounds Peri-Wound Care: Skin Prep (Generic) Every Other Day/30 Days Discharge Instructions: Use skin prep as directed Peri-Wound Care: UNI-SOLVE Adhesive Remover Wipes, 2.25x2 (in/in) Every Other Day/30 Days Primary Dressing: Hydrofera Blue Ready Transfer Foam, 2.5x2.5 (in/in) Every Other Day/30 Days Discharge Instructions: Apply Hydrofera Blue Ready to wound bed as directed Secondary Dressing: Zetuvit Plus Silicone Border Dressing 4x4 (in/in) (Generic) Every Other Day/30 Days Secured With: 73M  Medipore H Soft Cloth Surgical Tape, 2x2 (in/yd) (Generic) Every Other Day/30 Days Discharge Instructions: optional for pt is needed to secure dressing Secured With: Kerlix Roll Sterile or Non-Sterile 6-ply 4.5x4 (yd/yd) (Generic) Every Other Day/30 Days Discharge Instructions: Apply Kerlix as directed optional for patient if needs to use to secure dressing 1. Hydrofera Blue 2. Follow-up in 2 weeks Electronic Signature(s) Signed: 10/06/2021 9:54:38 AM By: Kalman Shan DO Entered By: Kalman Shan on 10/06/2021 09:53:07 Caitlin Jenkins ([id number]) -------------------------------------------------------------------------------- SuperBill Details Patient Name: Caitlin Jenkins Date of Service: 10/06/2021 Medical Record Number: [id number] Patient Account Number: [id number] Date of Birth/Sex: [birth year] (88 y.o. F) Treating RN: Levora Dredge Primary Care Provider: Delsa Grana Other Clinician: Referring Provider: Delsa Grana Treating  Provider/Extender: Yaakov Guthrie in Treatment: 2 Diagnosis Coding ICD-10 Codes Code Description [phone] Non-pressure chronic ulcer of other part of right lower leg with fat layer exposed E03.9 Hypothyroidism, unspecified I10 Essential (primary) hypertension K74.3 Primary biliary cirrhosis T79.8XXA Other early complications of trauma, initial encounter Facility Procedures CPT4 Code: [id number] Description: [id number] - WOUND CARE VISIT-LEV 2 EST PT Modifier: Quantity: 1 Physician Procedures CPT4 Code: [id number] Description: 92330 - WC PHYS LEVEL 3 - EST PT Modifier: Quantity: 1 CPT4 Code: Description: ICD-10 Diagnosis Description Q76.226 Non-pressure chronic ulcer of other part of right lower leg with fat la T79.8XXA Other early complications of trauma, initial encounter I10 Essential (primary) hypertension E03.9 Hypothyroidism,  unspecified Modifier: yer exposed Quantity: Electronic Signature(s) Signed: 10/06/2021 9:54:38 AM By: Kalman Shan DO Entered By: Kalman Shan on 10/06/2021 09:53:31

## 2021-10-07 NOTE — Progress Notes (Signed)
EVERLEY, EVORA ([id number]) Visit Report for 10/06/2021 Arrival Information Details Patient Name: Caitlin Jenkins Date of Service: 10/06/2021 9:00 AM Medical Record Number: [id number] Patient Account Number: [id number] Date of Birth/Sex: [birth year] (88 y.o. F) Treating RN: Levora Dredge Primary Care Kajuan Guyton: Delsa Grana Other Clinician: Referring Daemyn Gariepy: Delsa Grana Treating Bates Collington/Extender: Yaakov Guthrie in Treatment: 2 Visit Information History Since Last Visit Added or deleted any medications: No Patient Arrived: Ambulatory Any new allergies or adverse reactions: No Arrival Time: 09:02 Had a fall or experienced change in No Accompanied By: daughter activities of daily living that may affect Transfer Assistance: None risk of falls: Patient Identification Verified: Yes Hospitalized since last visit: No Secondary Verification Process Completed: Yes Has Dressing in Place as Prescribed: Yes Patient Requires Transmission-Based Precautions: No Pain Present Now: No Patient Has Alerts: Yes Patient Alerts: NOT diabetic Electronic Signature(s) Signed: 10/07/2021 12:48:18 PM By: Levora Dredge Entered By: Levora Dredge on 10/06/2021 09:02:58 Caitlin Jenkins ([id number]) -------------------------------------------------------------------------------- Clinic Level of Care Assessment Details Patient Name: Caitlin Jenkins Date of Service: 10/06/2021 9:00 AM Medical Record Number: [id number] Patient Account Number: [id number] Date of Birth/Sex: [birth year] (88 y.o. F) Treating RN: Levora Dredge Primary Care Armonte Tortorella: Delsa Grana Other Clinician: Referring Lakin Romer: Delsa Grana Treating Eldean Klatt/Extender: Yaakov Guthrie in Treatment: 2 Clinic Level of Care Assessment Items TOOL 4 Quantity Score X - Use when only an EandM is performed on FOLLOW-UP visit 1 0 ASSESSMENTS - Nursing Assessment / Reassessment []  - Reassessment of Co-morbidities  (includes updates in patient status) 0 []  - 0 Reassessment of Adherence to Treatment Plan ASSESSMENTS - Wound and Skin Assessment / Reassessment X - Simple Wound Assessment / Reassessment - one wound 1 5 []  - 0 Complex Wound Assessment / Reassessment - multiple wounds []  - 0 Dermatologic / Skin Assessment (not related to wound area) ASSESSMENTS - Focused Assessment []  - Circumferential Edema Measurements - multi extremities 0 []  - 0 Nutritional Assessment / Counseling / Intervention []  - 0 Lower Extremity Assessment (monofilament, tuning fork, pulses) []  - 0 Peripheral Arterial Disease Assessment (using hand held doppler) ASSESSMENTS - Ostomy and/or Continence Assessment and Care []  - Incontinence Assessment and Management 0 []  - 0 Ostomy Care Assessment and Management (repouching, etc.) PROCESS - Coordination of Care X - Simple Patient / Family Education for ongoing care 1 15 []  - 0 Complex (extensive) Patient / Family Education for ongoing care []  - 0 Staff obtains Programmer, systems, Records, Test Results / Process Orders []  - 0 Staff telephones HHA, Nursing Homes / Clarify orders / etc []  - 0 Routine Transfer to another Facility (non-emergent condition) []  - 0 Routine Hospital Admission (non-emergent condition) []  - 0 New Admissions / Biomedical engineer / Ordering NPWT, Apligraf, etc. []  - 0 Emergency Hospital Admission (emergent condition) X- 1 10 Simple Discharge Coordination []  - 0 Complex (extensive) Discharge Coordination PROCESS - Special Needs []  - Pediatric / Minor Patient Management 0 []  - 0 Isolation Patient Management []  - 0 Hearing / Language / Visual special needs []  - 0 Assessment of Community assistance (transportation, D/C planning, etc.) []  - 0 Additional assistance / Altered mentation []  - 0 Support Surface(s) Assessment (bed, cushion, seat, etc.) INTERVENTIONS - Wound Cleansing / Measurement Monette, Ronnisha W. ([id number]) X- 1 5 Simple  Wound Cleansing - one wound []  - 0 Complex Wound Cleansing - multiple wounds X- 1 5 Wound Imaging (photographs - any number of wounds) []  - 0 Wound Tracing (instead of photographs) X- 1 5 Simple  Wound Measurement - one wound []  - 0 Complex Wound Measurement - multiple wounds INTERVENTIONS - Wound Dressings X - Small Wound Dressing one or multiple wounds 1 10 []  - 0 Medium Wound Dressing one or multiple wounds []  - 0 Large Wound Dressing one or multiple wounds []  - 0 Application of Medications - topical []  - 0 Application of Medications - injection INTERVENTIONS - Miscellaneous []  - External ear exam 0 []  - 0 Specimen Collection (cultures, biopsies, blood, body fluids, etc.) []  - 0 Specimen(s) / Culture(s) sent or taken to Lab for analysis []  - 0 Patient Transfer (multiple staff / Civil Service fast streamer / Similar devices) []  - 0 Simple Staple / Suture removal (25 or less) []  - 0 Complex Staple / Suture removal (26 or more) []  - 0 Hypo / Hyperglycemic Management (close monitor of Blood Glucose) []  - 0 Ankle / Brachial Index (ABI) - do not check if billed separately X- 1 5 Vital Signs Has the patient been seen at the hospital within the last three years: Yes Total Score: 60 Level Of Care: New/Established - Level 2 Electronic Signature(s) Signed: 10/07/2021 12:48:18 PM By: Levora Dredge Entered By: Levora Dredge on 10/06/2021 09:46:09 Caitlin Jenkins ([id number]) -------------------------------------------------------------------------------- Encounter Discharge Information Details Patient Name: Caitlin Jenkins Date of Service: 10/06/2021 9:00 AM Medical Record Number: [id number] Patient Account Number: [id number] Date of Birth/Sex: 06/23/1932 (88 y.o. F) Treating RN: Levora Dredge Primary Care Carmell Elgin: Delsa Grana Other Clinician: Referring Penn Grissett: Delsa Grana Treating Corbitt Cloke/Extender: Yaakov Guthrie in Treatment: 2 Encounter Discharge Information  Items Discharge Condition: Stable Ambulatory Status: Ambulatory Discharge Destination: Home Transportation: Private Auto Accompanied By: daughter Schedule Follow-up Appointment: Yes Clinical Summary of Care: Electronic Signature(s) Signed: 10/07/2021 12:48:18 PM By: Levora Dredge Entered By: Levora Dredge on 10/06/2021 09:49:35 Caitlin Jenkins ([id number]) -------------------------------------------------------------------------------- Lower Extremity Assessment Details Patient Name: Caitlin Jenkins Date of Service: 10/06/2021 9:00 AM Medical Record Number: [id number] Patient Account Number: [id number] Date of Birth/Sex: 02/05/1932 (88 y.o. F) Treating RN: Levora Dredge Primary Care Lama Narayanan: Delsa Grana Other Clinician: Referring Noami Bove: Delsa Grana Treating Jasiah Buntin/Extender: Yaakov Guthrie in Treatment: 2 Edema Assessment Assessed: [Left: No] [Right: No] Edema: [Left: N] [Right: o] Calf Left: Right: Point of Measurement: From Medial Instep 32.5 cm Ankle Left: Right: Point of Measurement: From Medial Instep 19.6 cm Vascular Assessment Pulses: Dorsalis Pedis Palpable: [Right:Yes] Posterior Tibial Palpable: [Right:Yes] Electronic Signature(s) Signed: 10/07/2021 12:48:18 PM By: Levora Dredge Entered By: Levora Dredge on 10/06/2021 09:12:18 Caitlin Jenkins ([id number]) -------------------------------------------------------------------------------- Multi Wound Chart Details Patient Name: Caitlin Jenkins Date of Service: 10/06/2021 9:00 AM Medical Record Number: [id number] Patient Account Number: [id number] Date of Birth/Sex: 08-27-1932 (88 y.o. F) Treating RN: Levora Dredge Primary Care Ronnetta Currington: Delsa Grana Other Clinician: Referring Biagio Snelson: Delsa Grana Treating Taila Basinski/Extender: Yaakov Guthrie in Treatment: 2 Vital Signs Height(in): 64 Pulse(bpm): 93 Weight(lbs): 150 Blood Pressure(mmHg): 168/88 Body Mass  Index(BMI): 26 Temperature(F): 97.4 Respiratory Rate(breaths/min): 18 Photos: [N/A:N/A] Wound Location: Right, Anterior Lower Leg N/A N/A Wounding Event: Laceration N/A N/A Primary Etiology: Venous Leg Ulcer N/A N/A Secondary Etiology: Trauma, Other N/A N/A Comorbid History: Coronary Artery Disease, N/A N/A Hypertension, Cirrhosis , Gout Date Acquired: 08/27/2021 N/A N/A Weeks of Treatment: 2 N/A N/A Wound Status: Open N/A N/A Measurements L x W x D (cm) 1.2x0.9x0.2 N/A N/A Area (cm) : 0.848 N/A N/A Volume (cm) : 0.17 N/A N/A % Reduction in Area: 22.90% N/A N/A % Reduction in Volume: -54.50% N/A N/A Classification: Full Thickness Without  Exposed N/A N/A Support Structures Exudate Amount: Medium N/A N/A Exudate Type: Serosanguineous N/A N/A Exudate Color: red, brown N/A N/A Granulation Amount: Large (67-100%) N/A N/A Granulation Quality: Red, Pink N/A N/A Necrotic Amount: Small (1-33%) N/A N/A Exposed Structures: Fat Layer (Subcutaneous Tissue): N/A N/A Yes Fascia: No Tendon: No Muscle: No Joint: No Bone: No Epithelialization: None N/A N/A Treatment Notes Electronic Signature(s) Signed: 10/07/2021 12:48:18 PM By: Levora Dredge Entered By: Levora Dredge on 10/06/2021 09:35:23 Caitlin Jenkins ([id number]) -------------------------------------------------------------------------------- [place] Details Patient Name: Caitlin Jenkins Date of Service: 10/06/2021 9:00 AM Medical Record Number: [id number] Patient Account Number: [id number] Date of Birth/Sex: October 09, 1931 (88 y.o. F) Treating RN: Levora Dredge Primary Care Severino Paolo: Delsa Grana Other Clinician: Referring Dany Harten: Delsa Grana Treating Keta [place]/Extender: Yaakov Guthrie in Treatment: 2 Active Inactive Orientation to the Wound Care Program Nursing Diagnoses: Knowledge deficit related to the wound healing center program Goals: Patient/caregiver will verbalize  understanding of the [facility] Program Date Initiated: 09/22/2021 Target Resolution Date: 10/01/2021 Goal Status: Active Interventions: Provide education on orientation to the wound center Notes: Wound/Skin Impairment Nursing Diagnoses: Impaired tissue integrity Knowledge deficit related to smoking impact on wound healing Knowledge deficit related to ulceration/compromised skin integrity Goals: Patient/caregiver will verbalize understanding of skin care regimen Date Initiated: 09/22/2021 Target Resolution Date: 10/01/2021 Goal Status: Active Ulcer/skin breakdown will have a volume reduction of 30% by week 4 Date Initiated: 09/22/2021 Target Resolution Date: 10/20/2021 Goal Status: Active Ulcer/skin breakdown will have a volume reduction of 50% by week 8 Date Initiated: 09/22/2021 Target Resolution Date: 11/17/2021 Goal Status: Active Ulcer/skin breakdown will have a volume reduction of 80% by week 12 Date Initiated: 09/22/2021 Target Resolution Date: 12/08/2021 Goal Status: Active Ulcer/skin breakdown will heal within 14 weeks Date Initiated: 09/22/2021 Target Resolution Date: 12/22/2021 Goal Status: Active Interventions: Assess patient/caregiver ability to obtain necessary supplies Assess patient/caregiver ability to perform ulcer/skin care regimen upon admission and as needed Assess ulceration(s) every visit Notes: Electronic Signature(s) Signed: 10/07/2021 12:48:18 PM By: Levora Dredge Entered By: Levora Dredge on 10/06/2021 09:35:12 Caitlin Jenkins ([id number]) -------------------------------------------------------------------------------- Pain Assessment Details Patient Name: Caitlin Jenkins Date of Service: 10/06/2021 9:00 AM Medical Record Number: [id number] Patient Account Number: [id number] Date of Birth/Sex: 1932/02/13 (88 y.o. F) Treating RN: Levora Dredge Primary Care Siboney Requejo: Delsa Grana Other Clinician: Referring Jarrell Armond: Delsa Grana Treating Antino Mayabb/Extender: Yaakov Guthrie in Treatment: 2 Active Problems Location of Pain Severity and Description of Pain Patient Has Paino No Site Locations Rate the pain. Current Pain Level: 0 Pain Management and Medication Current Pain Management: Electronic Signature(s) Signed: 10/07/2021 12:48:18 PM By: Levora Dredge Entered By: Levora Dredge on 10/06/2021 09:05:08 Caitlin Jenkins ([id number]) -------------------------------------------------------------------------------- Patient/Caregiver Education Details Patient Name: Caitlin Jenkins Date of Service: 10/06/2021 9:00 AM Medical Record Number: [id number] Patient Account Number: [id number] Date of Birth/Gender: 1932/06/01 (88 y.o. F) Treating RN: Levora Dredge Primary Care Physician: Delsa Grana Other Clinician: Referring Physician: Delsa Grana Treating Physician/Extender: Yaakov Guthrie in Treatment: 2 Education Assessment Education Provided To: Patient and Caregiver Education Topics Provided Wound/Skin Impairment: Handouts: Caring for Your Ulcer Methods: Explain/Verbal Responses: State content correctly Electronic Signature(s) Signed: 10/07/2021 12:48:18 PM By: Levora Dredge Entered By: Levora Dredge on 10/06/2021 09:46:26 Caitlin Jenkins ([id number]) -------------------------------------------------------------------------------- Wound Assessment Details Patient Name: Caitlin Jenkins Date of Service: 10/06/2021 9:00 AM Medical Record Number: [id number] Patient Account Number: [id number] Date of Birth/Sex: Feb 23, 1932 (88 y.o. F) Treating RN: Levora Dredge Primary Care Kenora Spayd: Delsa Grana  Other Clinician: Referring Tarryn Bogdan: Delsa Grana Treating Gatlyn Lipari/Extender: Yaakov Guthrie in Treatment: 2 Wound Status Wound Number: 1 Primary Etiology: Venous Leg Ulcer Wound Location: Right, Anterior Lower Leg Secondary Trauma, Other Etiology: Wounding Event:  Laceration Wound Status: Open Date Acquired: 08/27/2021 Comorbid History: Coronary Artery Disease, Hypertension, Cirrhosis , Weeks Of Treatment: 2 Gout Clustered Wound: No Photos Wound Measurements Length: (cm) 1.2 Width: (cm) 0.9 Depth: (cm) 0.2 Area: (cm) 0.848 Volume: (cm) 0.17 % Reduction in Area: 22.9% % Reduction in Volume: -54.5% Epithelialization: None Tunneling: No Undermining: No Wound Description Classification: Full Thickness Without Exposed Support Structures Exudate Amount: Medium Exudate Type: Serosanguineous Exudate Color: red, brown Foul Odor After Cleansing: No Slough/Fibrino Yes Wound Bed Granulation Amount: Large (67-100%) Exposed Structure Granulation Quality: Red, Pink Fascia Exposed: No Necrotic Amount: Small (1-33%) Fat Layer (Subcutaneous Tissue) Exposed: Yes Necrotic Quality: Adherent Slough Tendon Exposed: No Muscle Exposed: No Joint Exposed: No Bone Exposed: No Treatment Notes Wound #1 (Lower Leg) Wound Laterality: Right, Anterior Cleanser [place] Ancillary Kit - 15 Day Supply Discharge Instruction: Use supplies as instructed; Kit contains: (15) Saline Bullets; (15) 3x3 Gauze; 15 pr Gloves Normal Saline Discharge Instruction: Wash your hands with soap and water. Remove old dressing, discard into plastic bag and place into trash. Cleanse the wound with Normal Saline prior to applying a clean dressing using gauze sponges, not tissues or cotton balls. Do not JOE, TANNEY. ([id number]) scrub or use excessive force. Pat dry using gauze sponges, not tissue or cotton balls. Soap and Water Discharge Instruction: Gently cleanse wound with antibacterial soap, rinse and pat dry prior to dressing wounds Peri-Wound Care Skin Prep Discharge Instruction: Use skin prep as directed UNI-SOLVE Adhesive Remover Wipes, 2.25x2 (in/in) Topical Primary Dressing Hydrofera Blue Ready Transfer Foam, 2.5x2.5 (in/in) Discharge Instruction: Apply Hydrofera  Blue Ready to wound bed as directed Secondary Dressing Zetuvit Plus Silicone Border Dressing 4x4 (in/in) Secured With 44M Medipore H Soft Cloth Surgical Tape, 2x2 (in/yd) Kerlix Roll Sterile or Non-Sterile 6-ply 4.5x4 (yd/yd) Discharge Instruction: Apply Kerlix as directed Compression Wrap Compression Stockings Add-Ons Electronic Signature(s) Signed: 10/07/2021 12:48:18 PM By: Levora Dredge Entered By: Levora Dredge on 10/06/2021 09:09:38 Caitlin Jenkins ([id number]) -------------------------------------------------------------------------------- [place] Details Patient Name: Caitlin Jenkins Date of Service: 10/06/2021 9:00 AM Medical Record Number: [id number] Patient Account Number: [id number] Date of Birth/Sex: [birth year] (88 y.o. F) Treating RN: Levora Dredge Primary Care Tangy Drozdowski: Delsa Grana Other Clinician: Referring Ryiah Bellissimo: Delsa Grana Treating Ramyah Pankowski/Extender: Yaakov Guthrie in Treatment: 2 Vital Signs Time Taken: 09:03 Temperature (F): 97.4 Height (in): 64 Pulse (bpm): 81 Weight (lbs): 150 Respiratory Rate (breaths/min): 18 Body Mass Index (BMI): 25.7 Blood Pressure (mmHg): 168/88 Reference Range: 80 - 120 mg / dl Electronic Signature(s) Signed: 10/07/2021 12:48:18 PM By: Levora Dredge Entered By: Levora Dredge on 10/06/2021 09:04:57

## 2021-10-08 DIAGNOSIS — L97812 Non-pressure chronic ulcer of other part of right lower leg with fat layer exposed: Secondary | ICD-10-CM | POA: Diagnosis not present

## 2021-10-13 ENCOUNTER — Encounter: Payer: PPO | Admitting: Internal Medicine

## 2021-10-18 ENCOUNTER — Ambulatory Visit: Payer: PPO | Admitting: Family Medicine

## 2021-10-20 ENCOUNTER — Other Ambulatory Visit: Payer: Self-pay

## 2021-10-20 ENCOUNTER — Encounter (HOSPITAL_BASED_OUTPATIENT_CLINIC_OR_DEPARTMENT_OTHER): Payer: PPO | Admitting: Internal Medicine

## 2021-10-20 DIAGNOSIS — L97812 Non-pressure chronic ulcer of other part of right lower leg with fat layer exposed: Secondary | ICD-10-CM

## 2021-10-20 NOTE — Progress Notes (Signed)
Caitlin Jenkins ([id number]) Visit Report for 10/20/2021 Chief Complaint Document Details Patient Name: Caitlin Jenkins Date of Service: 10/20/2021 9:15 AM Medical Record Number: [id number] Patient Account Number: [id number] Date of Birth/Sex: 1932/07/25 (88 y.o. F) Treating RN: Donnamarie Poag Primary Care Provider: Delsa Grana Other Clinician: Referring Provider: Delsa Grana Treating Provider/Extender: Yaakov Guthrie in Treatment: 4 Information Obtained from: Patient Chief Complaint Right lower extremity wound caused by trauma Electronic Signature(s) Signed: 10/20/2021 10:13:20 AM By: Kalman Shan DO Entered By: Kalman Shan on 10/20/2021 10:08:54 Caitlin Jenkins ([id number]) -------------------------------------------------------------------------------- Debridement Details Patient Name: Caitlin Jenkins Date of Service: 10/20/2021 9:15 AM Medical Record Number: [id number] Patient Account Number: [id number] Date of Birth/Sex: 12-Aug-1932 (88 y.o. F) Treating RN: Donnamarie Poag Primary Care Provider: Delsa Grana Other Clinician: Referring Provider: Delsa Grana Treating Provider/Extender: Yaakov Guthrie in Treatment: 4 Debridement Performed for Wound #1 Right,Anterior Lower Leg Assessment: Performed By: Physician Kalman Shan, MD Debridement Type: Debridement Severity of Tissue Pre Debridement: Fat layer exposed Level of Consciousness (Pre- Awake and Alert procedure): Pre-procedure Verification/Time Out Yes - 09:38 Taken: Start Time: 09:39 Pain Control: Lidocaine Total Area Debrided (L x W): 0.5 (cm) x 0.3 (cm) = 0.15 (cm) Tissue and other material Viable, Non-Viable, Slough, Subcutaneous, Slough debrided: Level: Skin/Subcutaneous Tissue Debridement Description: Excisional Instrument: Curette Bleeding: Minimum Hemostasis Achieved: Pressure Response to Treatment: Procedure was tolerated well Level of Consciousness (Post- Awake and  Alert procedure): Post Debridement Measurements of Total Wound Length: (cm) 0.5 Width: (cm) 0.3 Depth: (cm) 0.1 Volume: (cm) 0.012 Character of Wound/Ulcer Post Debridement: Improved Severity of Tissue Post Debridement: Fat layer exposed Post Procedure Diagnosis Same as Pre-procedure Electronic Signature(s) Signed: 10/20/2021 10:13:20 AM By: Kalman Shan DO Signed: 10/20/2021 1:37:32 PM By: Donnamarie Poag Entered By: Donnamarie Poag on 10/20/2021 Caitlin Jenkins. ([id number]) -------------------------------------------------------------------------------- HPI Details Patient Name: Caitlin Jenkins Date of Service: 10/20/2021 9:15 AM Medical Record Number: [id number] Patient Account Number: [id number] Date of Birth/Sex: October 14, 1931 (88 y.o. 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 She states she hit her leg against the car door. She was initially seen in the ED on 12/2 for this issue and had 3 sutures in place and started on doxycycline. She followed up in the ED on 12/14 and had sutures removed. She has been placing antibiotic ointment on the wound bed daily. She currently denies signs of infection. 1/4; patient presents for follow-up. She has been using Hydrofera Blue without issues. She denies signs of infection. 1/11; patient presents for follow-up. She has been using Hydrofera Blue to the wound bed. She has no issues or complaints today and denies signs of infection. 1/25; patient presents  for follow-up. She has been using Hydrofera Blue to the wound bed with no issues. She reports improvement in wound healing. She denies signs of infection. Electronic Signature(s) Signed: 10/20/2021 10:13:20 AM By: Kalman Shan DO Entered By: Kalman Shan on 10/20/2021 10:10:26 Caitlin Jenkins ([id number]) -------------------------------------------------------------------------------- Physical Exam Details Patient Name: Caitlin Jenkins Date of Service: 10/20/2021 9:15 AM Medical Record Number: [id number] Patient Account Number: [id number] Date of Birth/Sex: 1932/04/09 (88  y.o. F) Treating RN: Donnamarie Poag Primary Care Provider: Delsa Grana Other Clinician: Referring Provider: Delsa Grana Treating Provider/Extender: Yaakov Guthrie in Treatment: 4 Constitutional . Cardiovascular . Psychiatric . Notes Right lower extremity: To the anterior aspect there is an open wound with granulation tissue and nonviable tissue present. No undermining noted. No signs of infection. Electronic Signature(s) Signed: 10/20/2021 10:13:20 AM By: Kalman Shan DO Entered By: Kalman Shan on 10/20/2021 10:11:45 Caitlin Jenkins ([id number]) -------------------------------------------------------------------------------- Physician Orders Details Patient Name: Caitlin Jenkins Date of Service: 10/20/2021 9:15 AM Medical Record Number: [id number] Patient Account Number: [id number] Date of Birth/Sex: 1932/07/29 (88 y.o. F) Treating RN: Donnamarie Poag Primary Care Provider: Delsa Grana Other Clinician: Referring Provider: Delsa Grana Treating Provider/Extender: Yaakov Guthrie in Treatment: 4 Verbal / Phone Orders: No Diagnosis Coding Follow-up Appointments o Return Appointment in 2 weeks. o Nurse Visit as needed Bathing/ Shower/ Hygiene o Clean wound with Normal Saline or wound cleanser. - change dressing after shower or keep it dry o No tub  bath. Anesthetic (Use 'Patient Medications' Section for Anesthetic Order Entry) o Lidocaine applied to wound bed Edema Control - Lymphedema / Segmental Compressive Device / Other o Elevate leg(s) parallel to the floor when sitting. o DO YOUR BEST to sleep in the bed at night. DO NOT sleep in your recliner. Long hours of sitting in a recliner leads to swelling of the legs and/or potential wounds on your backside. Additional Orders / Instructions o Follow Nutritious Diet and Increase Protein Intake Wound Treatment Wound #1 - Lower Leg Wound Laterality: Right, Anterior Cleanser: Normal Saline (Generic) Every Other Day/30 Days Discharge Instructions: Wash your hands with soap and water. Remove old dressing, discard into plastic bag and place into trash. Cleanse the wound with Normal Saline prior to applying a clean dressing using gauze sponges, not tissues or cotton balls. Do not scrub or use excessive force. Pat dry using gauze sponges, not tissue or cotton balls. Cleanser: Soap and Water Every Other Day/30 Days Discharge Instructions: Gently cleanse wound with antibacterial soap, rinse and pat dry prior to dressing wounds Peri-Wound Care: Skin Prep (Generic) Every Other Day/30 Days Discharge Instructions: Use skin prep as directed PRN Peri-Wound Care: UNI-SOLVE Adhesive Remover Wipes, 2.25x2 (in/in) Every Other Day/30 Days Discharge Instructions: PRN Primary Dressing: Hydrofera Blue Ready Transfer Foam, 2.5x2.5 (in/in) Every Other Day/30 Days Discharge Instructions: Apply Hydrofera Blue Ready to wound bed as directed Secondary Dressing: Zetuvit Plus Silicone Border Dressing 4x4 (in/in) (Generic) Every Other Day/30 Days Electronic Signature(s) Signed: 10/20/2021 10:13:20 AM By: Kalman Shan DO Entered By: Kalman Shan on 10/20/2021 10:12:49 Caitlin Jenkins ([id number]) -------------------------------------------------------------------------------- Problem List  Details Patient Name: Caitlin Jenkins Date of Service: 10/20/2021 9:15 AM Medical Record Number: [id number] Patient Account Number: [id number] Date of Birth/Sex: [birth year] (88 y.o. F) Treating RN: Donnamarie Poag Primary Care Provider: Delsa Grana Other Clinician: Referring Provider: Delsa Grana Treating Provider/Extender: Yaakov Guthrie in Treatment: 4 Active Problems ICD-10 Encounter Code Description Active Date MDM Diagnosis [phone] Non-pressure chronic ulcer of other part of right lower leg with fat layer 09/22/2021 No Yes exposed E03.9 Hypothyroidism, unspecified 09/22/2021 No Yes I10 Essential (primary) hypertension 09/22/2021 No Yes K74.3 Primary biliary cirrhosis 09/22/2021 No Yes T79.8XXA Other early complications of trauma, initial encounter 09/22/2021 No Yes Inactive Problems Resolved Problems Electronic Signature(s) Signed: 10/20/2021 10:13:20 AM By: Kalman Shan DO Entered By: Kalman Shan on 10/20/2021 10:08:50 Caitlin Jenkins ([id number]) -------------------------------------------------------------------------------- Progress Note Details Patient Name: Lozada, Melayah W. Date of  Service: 10/20/2021 9:15 AM Medical Record Number: [id number] Patient Account Number: [id number] Date of Birth/Sex: [birth year] (88 y.o. F) Treating RN: Donnamarie Poag Primary Care Provider: Delsa Grana Other Clinician: Referring Provider: Delsa Grana Treating Provider/Extender: Yaakov Guthrie in Treatment: 4 Subjective Chief Complaint Information obtained from Patient Right lower extremity wound caused by trauma History of Present Illness (HPI) Admission 09/22/2021 Ms. 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 She states she  hit her leg against the car door. She was initially seen in the ED on 12/2 for this issue and had 3 sutures in place and started on doxycycline. She followed up in the ED on 12/14 and had sutures removed. She has been placing antibiotic ointment on the wound bed daily. She currently denies signs of infection. 1/4; patient presents for follow-up. She has been using Hydrofera Blue without issues. She denies signs of infection. 1/11; patient presents for follow-up. She has been using Hydrofera Blue to the wound bed. She has no issues or complaints today and denies signs of infection. 1/25; patient presents for follow-up. She has been using Hydrofera Blue to the wound bed with no issues. She reports improvement in wound healing. She denies signs of infection. Objective Constitutional Vitals Time Taken: 9:20 AM, Height: 64 in, Weight: 150 lbs, BMI: 25.7, Temperature: 98.1 F, Pulse: 90 bpm, Respiratory Rate: 16 breaths/min, Blood Pressure: 176/91 mmHg. General Notes: Right lower extremity: To the anterior aspect there is an open wound with granulation tissue and nonviable tissue present. No undermining noted. No signs of infection. Integumentary (Hair, Skin) Wound #1 status is Open. Original cause of wound was Laceration. The date acquired was: 08/27/2021. The wound has been in treatment 4 weeks. The wound is located on the Right,Anterior Lower Leg. The wound measures 0.5cm length x 0.3cm width x 0.1cm depth; 0.118cm^2 area and 0.012cm^3 volume. There is Fat Layer (Subcutaneous Tissue) exposed. There is no tunneling or undermining noted. There is a medium amount of serosanguineous drainage noted. There is medium (34-66%) red, pink granulation within the wound bed. There is a medium (34-66%) amount of necrotic tissue within the wound bed including Adherent Slough. Assessment Active Problems ICD-10 Non-pressure chronic ulcer of other part of right lower leg with fat layer exposed Hypothyroidism,  unspecified Essential (primary) hypertension Primary biliary cirrhosis Other early complications of trauma, initial encounter RICHANDA, DARIN. ([id number]) Patient's wound has shown significant improvement in size and appearance since last clinic visit. I debrided nonviable tissue. No signs of infection on exam. I recommended continue with Hydrofera Blue. Follow-up in 2 weeks. Procedures Wound #1 Pre-procedure diagnosis of Wound #1 is a Venous Leg Ulcer located on the Right,Anterior Lower Leg .Severity of Tissue Pre Debridement is: Fat layer exposed. There was a Excisional Skin/Subcutaneous Tissue Debridement with a total area of 0.15 sq cm performed by Kalman Shan, MD. With the following instrument(s): Curette to remove Viable and Non-Viable tissue/material. Material removed includes Subcutaneous Tissue and Slough and after achieving pain control using Lidocaine. A time out was conducted at 09:38, prior to the start of the procedure. A Minimum amount of bleeding was controlled with Pressure. The procedure was tolerated well. Post Debridement Measurements: 0.5cm length x 0.3cm width x 0.1cm depth; 0.012cm^3 volume. Character of Wound/Ulcer Post Debridement is improved. Severity of Tissue Post Debridement is: Fat  layer exposed. Post procedure Diagnosis Wound #1: Same as Pre-Procedure Plan Follow-up Appointments: Return Appointment in 2 weeks. Nurse Visit as needed Bathing/ Shower/ Hygiene: Clean wound with Normal Saline or wound cleanser. - change dressing after shower or keep it dry No tub bath. Anesthetic (Use 'Patient Medications' Section for Anesthetic Order Entry): Lidocaine applied to wound bed Edema Control - Lymphedema / Segmental Compressive Device / Other: Elevate leg(s) parallel to the floor when sitting. DO YOUR BEST to sleep in the bed at night. DO NOT sleep in your recliner. Long hours of sitting in a recliner leads to swelling of the legs and/or potential wounds on  your backside. Additional Orders / Instructions: Follow Nutritious Diet and Increase Protein Intake WOUND #1: - Lower Leg Wound Laterality: Right, Anterior Cleanser: Normal Saline (Generic) Every Other Day/30 Days Discharge Instructions: Wash your hands with soap and water. Remove old dressing, discard into plastic bag and place into trash. Cleanse the wound with Normal Saline prior to applying a clean dressing using gauze sponges, not tissues or cotton balls. Do not scrub or use excessive force. Pat dry using gauze sponges, not tissue or cotton balls. Cleanser: Soap and Water Every Other Day/30 Days Discharge Instructions: Gently cleanse wound with antibacterial soap, rinse and pat dry prior to dressing wounds Peri-Wound Care: Skin Prep (Generic) Every Other Day/30 Days Discharge Instructions: Use skin prep as directed PRN Peri-Wound Care: UNI-SOLVE Adhesive Remover Wipes, 2.25x2 (in/in) Every Other Day/30 Days Discharge Instructions: PRN Primary Dressing: Hydrofera Blue Ready Transfer Foam, 2.5x2.5 (in/in) Every Other Day/30 Days Discharge Instructions: Apply Hydrofera Blue Ready to wound bed as directed Secondary Dressing: Zetuvit Plus Silicone Border Dressing 4x4 (in/in) (Generic) Every Other Day/30 Days 1. Hydrofera Blue 2. In office sharp debridement 3. Follow-up in 2 weeks Electronic Signature(s) Signed: 10/20/2021 10:13:20 AM By: Kalman Shan DO Entered By: Kalman Shan on 10/20/2021 10:12:17 Caitlin Jenkins ([id number]) -------------------------------------------------------------------------------- SuperBill Details Patient Name: Caitlin Jenkins Date of Service: 10/20/2021 Medical Record Number: [id number] Patient Account Number: [id number] Date of Birth/Sex: [birth year] (88 y.o. F) Treating RN: Donnamarie Poag Primary Care Provider: Delsa Grana Other Clinician: Referring Provider: Delsa Grana Treating Provider/Extender: Yaakov Guthrie in Treatment:  4 Diagnosis Coding ICD-10 Codes Code Description [phone] Non-pressure chronic ulcer of other part of right lower leg with fat layer exposed E03.9 Hypothyroidism, unspecified I10 Essential (primary) hypertension K74.3 Primary biliary cirrhosis T79.8XXA Other early complications of trauma, initial encounter Facility Procedures CPT4 Code: [id number] Description: 35670 - DEB SUBQ TISSUE 20 SQ CM/< Modifier: Quantity: 1 CPT4 Code: Description: ICD-10 Diagnosis Description L41.030 Non-pressure chronic ulcer of other part of right lower leg with fat lay Modifier: er exposed Quantity: Physician Procedures CPT4 Code: [id number] Description: 11042 - WC PHYS SUBQ TISS 20 SQ CM Modifier: Quantity: 1 CPT4 Code: Description: ICD-10 Diagnosis Description L97.812 Non-pressure chronic ulcer of other part of right lower leg with fat lay Modifier: er exposed Quantity: Electronic Signature(s) Signed: 10/20/2021 10:13:20 AM By: Kalman Shan DO Entered By: Kalman Shan on 10/20/2021 10:12:29

## 2021-10-20 NOTE — Progress Notes (Signed)
LUCIELLE, VOKES ([id number]) Visit Report for 10/20/2021 Arrival Information Details Patient Name: Caitlin Jenkins Date of Service: 10/20/2021 9:15 AM Medical Record Number: [id number] Patient Account Number: [id number] Date of Birth/Sex: July 26, 1932 (88 y.o. F) Treating RN: Donnamarie Poag Primary Care Khalani Novoa: Delsa Grana Other Clinician: Referring Niguel Moure: Delsa Grana Treating Jamen Loiseau/Extender: Yaakov Guthrie in Treatment: 4 Visit Information History Since Last Visit Added or deleted any medications: No Patient Arrived: Ambulatory Had a fall or experienced change in No Arrival Time: 09:16 activities of daily living that may affect Accompanied By: daughter risk of falls: Transfer Assistance: None Hospitalized since last visit: No Patient Identification Verified: Yes Has Dressing in Place as Prescribed: Yes Secondary Verification Process Completed: Yes Pain Present Now: No Patient Requires Transmission-Based Precautions: No Patient Has Alerts: Yes Patient Alerts: NOT diabetic Electronic Signature(s) Signed: 10/20/2021 1:37:32 PM By: Donnamarie Poag Entered ByDonnamarie Poag on 10/20/2021 09:18:17 Caitlin Jenkins ([id number]) -------------------------------------------------------------------------------- Encounter Discharge Information Details Patient Name: Caitlin Jenkins Date of Service: 10/20/2021 9:15 AM Medical Record Number: [id number] Patient Account Number: [id number] Date of Birth/Sex: 1932-04-20 (88 y.o. F) Treating RN: Donnamarie Poag Primary Care Anaisabel Pederson: Delsa Grana Other Clinician: Referring Casmere Hollenbeck: Delsa Grana Treating Klint Lezcano/Extender: Yaakov Guthrie in Treatment: 4 Encounter Discharge Information Items Post Procedure Vitals Discharge Condition: Stable Temperature (F): 98.1 Ambulatory Status: Ambulatory Pulse (bpm): 90 Discharge Destination: Home Respiratory Rate (breaths/min): 16 Transportation: Private Auto Blood Pressure  (mmHg): 176/91 Accompanied By: daughter Schedule Follow-up Appointment: Yes Clinical Summary of Care: Electronic Signature(s) Signed: 10/20/2021 1:37:32 PM By: Donnamarie Poag Entered By: Donnamarie Poag on 10/20/2021 09:46:00 Caitlin Jenkins ([id number]) -------------------------------------------------------------------------------- Lower Extremity Assessment Details Patient Name: Caitlin Jenkins Date of Service: 10/20/2021 9:15 AM Medical Record Number: [id number] Patient Account Number: [id number] Date of Birth/Sex: 1932/01/19 (88 y.o. F) Treating RN: Donnamarie Poag Primary Care Harlee Eckroth: Delsa Grana Other Clinician: Referring Janace Decker: Delsa Grana Treating Genean Adamski/Extender: Yaakov Guthrie in Treatment: 4 Edema Assessment Assessed: [Left: No] Patrice Paradise: Yes] [Left: Edema] [Right: :] Calf Left: Right: Point of Measurement: From Medial Instep 32 cm Ankle Left: Right: Point of Measurement: From Medial Instep 20 cm Vascular Assessment Pulses: Dorsalis Pedis Palpable: [Right:Yes] Electronic Signature(s) Signed: 10/20/2021 1:37:32 PM By: Donnamarie Poag Entered ByDonnamarie Poag on 10/20/2021 09:27:25 Caitlin Jenkins ([id number]) -------------------------------------------------------------------------------- Multi Wound Chart Details Patient Name: Caitlin Jenkins Date of Service: 10/20/2021 9:15 AM Medical Record Number: [id number] Patient Account Number: [id number] Date of Birth/Sex: Nov 09, 1931 (88 y.o. F) Treating RN: Donnamarie Poag Primary Care Yoan Sallade: Delsa Grana Other Clinician: Referring Domenique Southers: Delsa Grana Treating Jameila Keeny/Extender: Yaakov Guthrie in Treatment: 4 Vital Signs Height(in): 64 Pulse(bpm): 90 Weight(lbs): 150 Blood Pressure(mmHg): 176/91 Body Mass Index(BMI): 25.7 Temperature(F): 98.1 Respiratory Rate(breaths/min): 16 Photos: [N/A:N/A] Wound Location: Right, Anterior Lower Leg N/A N/A Wounding Event: Laceration N/A N/A Primary  Etiology: Venous Leg Ulcer N/A N/A Secondary Etiology: Trauma, Other N/A N/A Comorbid History: Coronary Artery Disease, N/A N/A Hypertension, Cirrhosis , Gout Date Acquired: 08/27/2021 N/A N/A Weeks of Treatment: 4 N/A N/A Wound Status: Open N/A N/A Wound Recurrence: No N/A N/A Measurements L x W x D (cm) 0.5x0.3x0.1 N/A N/A Area (cm) : 0.118 N/A N/A Volume (cm) : 0.012 N/A N/A % Reduction in Area: 89.30% N/A N/A % Reduction in Volume: 89.10% N/A N/A Classification: Full Thickness Without Exposed N/A N/A Support Structures Exudate Amount: Medium N/A N/A Exudate Type: Serosanguineous N/A N/A Exudate Color: red, brown N/A N/A Granulation Amount: Medium (34-66%) N/A N/A Granulation Quality: Red, Pink N/A  N/A Necrotic Amount: Medium (34-66%) N/A N/A Exposed Structures: Fat Layer (Subcutaneous Tissue): N/A N/A Yes Fascia: No Tendon: No Muscle: No Joint: No Bone: No Epithelialization: None N/A N/A Treatment Notes Electronic Signature(s) Signed: 10/20/2021 1:37:32 PM By: Donnamarie Poag Entered By: Donnamarie Poag on 10/20/2021 09:39:08 Caitlin Jenkins ([id number]) -------------------------------------------------------------------------------- [place] Details Patient Name: Caitlin Jenkins Date of Service: 10/20/2021 9:15 AM Medical Record Number: [id number] Patient Account Number: [id number] Date of Birth/Sex: May 12, 1932 (88 y.o. F) Treating RN: Donnamarie Poag Primary Care Esker Dever: Delsa Grana Other Clinician: Referring Amarria Andreasen: Delsa Grana Treating Marylen Zuk/Extender: Yaakov Guthrie in Treatment: 4 Active Inactive Wound/Skin Impairment Nursing Diagnoses: Impaired tissue integrity Knowledge deficit related to smoking impact on wound healing Knowledge deficit related to ulceration/compromised skin integrity Goals: Patient/caregiver will verbalize understanding of skin care regimen Date Initiated: 09/22/2021 Date Inactivated: 10/20/2021 Target  Resolution Date: 10/01/2021 Goal Status: Met Ulcer/skin breakdown will have a volume reduction of 30% by week 4 Date Initiated: 09/22/2021 Date Inactivated: 10/20/2021 Target Resolution Date: 10/20/2021 Goal Status: Met Ulcer/skin breakdown will have a volume reduction of 50% by week 8 Date Initiated: 09/22/2021 Target Resolution Date: 11/17/2021 Goal Status: Active Ulcer/skin breakdown will have a volume reduction of 80% by week 12 Date Initiated: 09/22/2021 Target Resolution Date: 12/08/2021 Goal Status: Active Ulcer/skin breakdown will heal within 14 weeks Date Initiated: 09/22/2021 Target Resolution Date: 12/22/2021 Goal Status: Active Interventions: Assess patient/caregiver ability to obtain necessary supplies Assess patient/caregiver ability to perform ulcer/skin care regimen upon admission and as needed Assess ulceration(s) every visit Notes: Electronic Signature(s) Signed: 10/20/2021 1:37:32 PM By: Donnamarie Poag Entered ByDonnamarie Poag on 10/20/2021 09:27:38 Caitlin Jenkins ([id number]) -------------------------------------------------------------------------------- Pain Assessment Details Patient Name: Caitlin Jenkins Date of Service: 10/20/2021 9:15 AM Medical Record Number: [id number] Patient Account Number: [id number] Date of Birth/Sex: [birth year] (88 y.o. F) Treating RN: Donnamarie Poag Primary Care Manuelito Poage: Delsa Grana Other Clinician: Referring Martavius Lusty: Delsa Grana Treating Madiha Bambrick/Extender: Yaakov Guthrie in Treatment: 4 Active Problems Location of Pain Severity and Description of Pain Patient Has Paino No Site Locations Rate the pain. Current Pain Level: 0 Pain Management and Medication Current Pain Management: Electronic Signature(s) Signed: 10/20/2021 1:37:32 PM By: Donnamarie Poag Entered By: Donnamarie Poag on 10/20/2021 09:21:01 Caitlin Jenkins  ([id number]) -------------------------------------------------------------------------------- Patient/Caregiver Education Details Patient Name: Caitlin Jenkins Date of Service: 10/20/2021 9:15 AM Medical Record Number: [id number] Patient Account Number: [id number] Date of Birth/Gender: 1931-09-29 (88 y.o. F) Treating RN: Donnamarie Poag Primary Care Physician: Delsa Grana Other Clinician: Referring Physician: Delsa Grana Treating Physician/Extender: Yaakov Guthrie in Treatment: 4 Education Assessment Education Provided To: Patient Education Topics Provided Basic Hygiene: Wound Debridement: Wound/Skin Impairment: Electronic Signature(s) Signed: 10/20/2021 1:37:32 PM By: Donnamarie Poag Entered By: Donnamarie Poag on 10/20/2021 09:45:14 Caitlin Jenkins ([id number]) -------------------------------------------------------------------------------- Wound Assessment Details Patient Name: Caitlin Jenkins Date of Service: 10/20/2021 9:15 AM Medical Record Number: [id number] Patient Account Number: [id number] Date of Birth/Sex: [birth year] (88 y.o. F) Treating RN: Donnamarie Poag Primary Care Bristyl Mclees: Delsa Grana Other Clinician: Referring Jillianna Stanek: Delsa Grana Treating Harmonee Tozer/Extender: Yaakov Guthrie in Treatment: 4 Wound Status Wound Number: 1 Primary Etiology: Venous Leg Ulcer Wound Location: Right, Anterior Lower Leg Secondary Trauma, Other Etiology: Wounding Event: Laceration Wound Status: Open Date Acquired: 08/27/2021 Comorbid History: Coronary Artery Disease, Hypertension, Cirrhosis , Weeks Of Treatment: 4 Gout Clustered Wound: No Photos Wound Measurements Length: (cm) 0.5 Width: (cm) 0.3 Depth: (cm) 0.1 Area: (cm) 0.118 Volume: (cm) 0.012 % Reduction in Area: 89.3% %  Reduction in Volume: 89.1% Epithelialization: None Tunneling: No Undermining: No Wound Description Classification: Full Thickness Without Exposed Support Structures Exudate Amount:  Medium Exudate Type: Serosanguineous Exudate Color: red, brown Foul Odor After Cleansing: No Slough/Fibrino Yes Wound Bed Granulation Amount: Medium (34-66%) Exposed Structure Granulation Quality: Red, Pink Fascia Exposed: No Necrotic Amount: Medium (34-66%) Fat Layer (Subcutaneous Tissue) Exposed: Yes Necrotic Quality: Adherent Slough Tendon Exposed: No Muscle Exposed: No Joint Exposed: No Bone Exposed: No Treatment Notes Wound #1 (Lower Leg) Wound Laterality: Right, Anterior Cleanser Normal Saline Discharge Instruction: Wash your hands with soap and water. Remove old dressing, discard into plastic bag and place into trash. Cleanse the wound with Normal Saline prior to applying a clean dressing using gauze sponges, not tissues or cotton balls. Do not scrub or use excessive force. Pat dry using gauze sponges, not tissue or cotton balls. Soap and [address] LEATHA, ROHNER ([id number]) Discharge Instruction: Gently cleanse wound with antibacterial soap, rinse and pat dry prior to dressing wounds Peri-Wound Care Skin Prep Discharge Instruction: Use skin prep as directed PRN UNI-SOLVE Adhesive Remover Wipes, 2.25x2 (in/in) Discharge Instruction: PRN Topical Primary Dressing Hydrofera Blue Ready Transfer Foam, 2.5x2.5 (in/in) Discharge Instruction: Apply Hydrofera Blue Ready to wound bed as directed Secondary Dressing Zetuvit Plus Silicone Border Dressing 4x4 (in/in) Secured With Compression Wrap Compression Stockings Add-Ons Electronic Signature(s) Signed: 10/20/2021 1:37:32 PM By: Donnamarie Poag Entered ByDonnamarie Poag on 10/20/2021 09:26:45 Caitlin Jenkins ([id number]) -------------------------------------------------------------------------------- [place] Details Patient Name: Caitlin Jenkins Date of Service: 10/20/2021 9:15 AM Medical Record Number: [id number] Patient Account Number: [id number] Date of Birth/Sex: 21-Jun-1932 (88 y.o. F) Treating RN: Donnamarie Poag Primary Care Daphine Loch: Delsa Grana Other Clinician: Referring Thomos Domine: Delsa Grana Treating Zaki Gertsch/Extender: Yaakov Guthrie in Treatment: 4 Vital Signs Time Taken: 09:20 Temperature (F): 98.1 Height (in): 64 Pulse (bpm): 90 Weight (lbs): 150 Respiratory Rate (breaths/min): 16 Body Mass Index (BMI): 25.7 Blood Pressure (mmHg): 176/91 Reference Range: 80 - 120 mg / dl Electronic Signature(s) Signed: 10/20/2021 1:37:32 PM By: Donnamarie Poag Entered ByDonnamarie Poag on 10/20/2021 09:20:53

## 2021-11-02 ENCOUNTER — Ambulatory Visit (INDEPENDENT_AMBULATORY_CARE_PROVIDER_SITE_OTHER): Payer: PPO | Admitting: Unknown Physician Specialty

## 2021-11-02 ENCOUNTER — Encounter: Payer: Self-pay | Admitting: Unknown Physician Specialty

## 2021-11-02 DIAGNOSIS — E782 Mixed hyperlipidemia: Secondary | ICD-10-CM

## 2021-11-02 DIAGNOSIS — N2581 Secondary hyperparathyroidism of renal origin: Secondary | ICD-10-CM | POA: Diagnosis not present

## 2021-11-02 DIAGNOSIS — I10 Essential (primary) hypertension: Secondary | ICD-10-CM | POA: Diagnosis not present

## 2021-11-02 DIAGNOSIS — E039 Hypothyroidism, unspecified: Secondary | ICD-10-CM

## 2021-11-02 DIAGNOSIS — E890 Postprocedural hypothyroidism: Secondary | ICD-10-CM

## 2021-11-02 MED ORDER — AMLODIPINE BESYLATE 5 MG PO TABS: 5.0000 mg | ORAL_TABLET | Freq: Every day | ORAL | 0 refills | Status: DC

## 2021-11-02 MED ORDER — LOSARTAN POTASSIUM 100 MG PO TABS: ORAL_TABLET | ORAL | 3 refills | Status: AC

## 2021-11-02 MED ORDER — LEVOTHYROXINE SODIUM 75 MCG PO TABS: 75.0000 ug | ORAL_TABLET | Freq: Every day | ORAL | 1 refills | Status: DC

## 2021-11-02 NOTE — Assessment & Plan Note (Signed)
BP at goal at home.  Will continue current treatment with well known white coat hypertension

## 2021-11-02 NOTE — Assessment & Plan Note (Signed)
Risk vs benefits of statins discussed.  Pt elects to stop her Atorvastatin

## 2021-11-02 NOTE — Assessment & Plan Note (Signed)
Stable, continue present medications.   

## 2021-11-02 NOTE — Progress Notes (Signed)
BP (!) 170/90    Pulse 96    Temp 97.9 F (36.6 C) (Oral)    Resp 16    Ht 5\' 4"  (1.626 m)    Wt 151 lb 8 oz (68.7 kg)    SpO2 98%    BMI 26.00 kg/m    Subjective:    Patient ID: Caitlin Jenkins, female    DOB: [birth year], 88 y.o.   MRN: [id number]  HPI: Caitlin Jenkins is a 88 y.o. female  Chief Complaint  Patient presents with   Follow-up   Hypertension   Hyperlipidemia   Hypothyroidism   Pt here to f/u on multiple antibioitcs  Hypertension Using medications without difficulty Average home BPs typically below 140/90 but has well kown white coat hypertentions   No problems or lightheadedness No chest pain with exertion or shortness of breath No Edema   Hyperlipidemia Using medications but cut back to every other day.  Wants to know if she "needs it"/   Muscle aches since taking Levaquin Diet compliance: Eats well   Hypothyroid TSH at goal 3 days ago  Pt is on a number of supplements and wonders if she needs them  Relevant past medical, surgical, family and social history reviewed and updated as indicated. Interim medical history since our last visit reviewed. Allergies and medications reviewed and updated.  Review of Systems  Per HPI unless specifically indicated above     Objective:    BP (!) 170/90    Pulse 96    Temp 97.9 F (36.6 C) (Oral)    Resp 16    Ht 5\' 4"  (1.626 m)    Wt 151 lb 8 oz (68.7 kg)    SpO2 98%    BMI 26.00 kg/m   Wt Readings from Last 3 Encounters:  11/02/21 151 lb 8 oz (68.7 kg)  09/08/21 164 lb 14.5 oz (74.8 kg)  08/27/21 165 lb (74.8 kg)    Physical Exam Constitutional:      General: She is not in acute distress.    Appearance: Normal appearance. She is well-developed.  HENT:     Head: Normocephalic and atraumatic.  Eyes:     General: Lids are normal. No scleral icterus.       Right eye: No discharge.        Left eye: No discharge.     Conjunctiva/sclera: Conjunctivae normal.  Neck:     Vascular: No carotid bruit or  JVD.  Cardiovascular:     Rate and Rhythm: Normal rate and regular rhythm.     Heart sounds: Normal heart sounds.  Pulmonary:     Effort: Pulmonary effort is normal. No respiratory distress.     Breath sounds: Normal breath sounds.  Abdominal:     Palpations: There is no hepatomegaly or splenomegaly.  Musculoskeletal:        General: Normal range of motion.     Cervical back: Normal range of motion and neck supple.  Skin:    General: Skin is warm and dry.     Coloration: Skin is not pale.     Findings: No rash.  Neurological:     Mental Status: She is alert and oriented to person, place, and time.  Psychiatric:        Behavior: Behavior normal.        Thought Content: Thought content normal.        Judgment: Judgment normal.    Results for orders placed or performed in visit on  07/13/21  Comprehensive metabolic panel  Result Value Ref Range   Glucose, Bld 86 65 - 139 mg/dL   BUN 25 7 - 25 mg/dL   Creat 1.46 (H) 0.60 - 0.95 mg/dL   BUN/Creatinine Ratio 17 6 - 22 (calc)   Sodium 132 (L) 135 - 146 mmol/L   Potassium 4.6 3.5 - 5.3 mmol/L   Chloride 98 98 - 110 mmol/L   CO2 24 20 - 32 mmol/L   Calcium 10.2 8.6 - 10.4 mg/dL   Total Protein 7.2 6.1 - 8.1 g/dL   Albumin 4.4 3.6 - 5.1 g/dL   Globulin 2.8 1.9 - 3.7 g/dL (calc)   AG Ratio 1.6 1.0 - 2.5 (calc)   Total Bilirubin 0.4 0.2 - 1.2 mg/dL   Alkaline phosphatase (APISO) 93 37 - 153 U/L   AST 21 10 - 35 U/L   ALT 8 6 - 29 U/L  TSH  Result Value Ref Range   TSH 2.64 0.40 - 4.50 mIU/L      Assessment & Plan:   Problem List Items Addressed This Visit       Unprioritized   Essential hypertension, benign (Chronic)    BP at goal at home.  Will continue current treatment with well known white coat hypertension      Relevant Medications   amLODipine (NORVASC) 5 MG tablet   losartan (COZAAR) 100 MG tablet   Hyperlipidemia (Chronic)    Risk vs benefits of statins discussed.  Pt elects to stop her Atorvastatin       Relevant Medications   amLODipine (NORVASC) 5 MG tablet   losartan (COZAAR) 100 MG tablet   Hypothyroidism (Chronic)    Stable, continue present medications.        Relevant Medications   levothyroxine (SYNTHROID) 75 MCG tablet   Hyperparathyroidism, secondary renal ([place])    Will get Vit D checked with nephrology       Reviewed supplements, will stop Niacinamide, B1, tumeric, Vit B12, Calcium, and Magnesium  Follow up plan: Return in about 6 months (around 05/02/2022).

## 2021-11-02 NOTE — Assessment & Plan Note (Signed)
Will get Vit D checked with nephrology

## 2021-11-03 ENCOUNTER — Encounter: Payer: PPO | Attending: Internal Medicine | Admitting: Internal Medicine

## 2021-11-03 ENCOUNTER — Other Ambulatory Visit: Payer: Self-pay

## 2021-11-03 DIAGNOSIS — T798XXA Other early complications of trauma, initial encounter: Secondary | ICD-10-CM | POA: Insufficient documentation

## 2021-11-03 DIAGNOSIS — L97812 Non-pressure chronic ulcer of other part of right lower leg with fat layer exposed: Secondary | ICD-10-CM | POA: Diagnosis not present

## 2021-11-03 DIAGNOSIS — W230XXA Caught, crushed, jammed, or pinched between moving objects, initial encounter: Secondary | ICD-10-CM | POA: Diagnosis not present

## 2021-11-03 DIAGNOSIS — E039 Hypothyroidism, unspecified: Secondary | ICD-10-CM | POA: Diagnosis not present

## 2021-11-03 DIAGNOSIS — K743 Primary biliary cirrhosis: Secondary | ICD-10-CM | POA: Diagnosis not present

## 2021-11-03 DIAGNOSIS — I2510 Atherosclerotic heart disease of native coronary artery without angina pectoris: Secondary | ICD-10-CM | POA: Diagnosis not present

## 2021-11-03 DIAGNOSIS — I10 Essential (primary) hypertension: Secondary | ICD-10-CM | POA: Insufficient documentation

## 2021-11-04 ENCOUNTER — Telehealth: Payer: Self-pay | Admitting: Family Medicine

## 2021-11-04 DIAGNOSIS — I10 Essential (primary) hypertension: Secondary | ICD-10-CM

## 2021-11-04 MED ORDER — AMLODIPINE BESYLATE 5 MG PO TABS: 5.0000 mg | ORAL_TABLET | Freq: Every day | ORAL | 1 refills | Status: DC

## 2021-11-04 NOTE — Progress Notes (Signed)
NAN, Caitlin ([id number]) Visit Report for 11/03/2021 Chief Complaint Document Details Patient Name: Caitlin Jenkins Date of Service: 11/03/2021 2:45 PM Medical Record Number: [id number] Patient Account Number: [id number] Date of Birth/Sex: 1931/10/12 (88 y.o. F) Treating RN: Carlene Coria Primary Care Provider: Delsa Grana Other Clinician: Referring Provider: Delsa Grana Treating Provider/Extender: Yaakov Guthrie in Treatment: 6 Information Obtained from: Patient Chief Complaint Right lower extremity wound caused by trauma Electronic Signature(s) Signed: 11/04/2021 12:22:13 PM By: Kalman Shan DO Entered By: Kalman Shan on 11/04/2021 12:15:49 Caitlin Jenkins ([id number]) -------------------------------------------------------------------------------- HPI Details Patient Name: Caitlin Jenkins Date of Service: 11/03/2021 2:45 PM Medical Record Number: [id number] Patient Account Number: [id number] Date of Birth/Sex: [birth year] (88 y.o. F) Treating RN: Carlene Coria Primary Care Provider: Delsa Grana Other Clinician: Referring Provider: Delsa Grana Treating Provider/Extender: Yaakov Guthrie in Treatment: 6 History of Present Illness HPI Description: Admission 09/22/2021 Ms. 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 She states she hit her leg against the car door. She was initially seen in the ED on 12/2 for this issue and had 3 sutures in place and started on doxycycline. She followed up in the ED on 12/14 and had sutures removed. She has been placing antibiotic ointment on the wound bed daily. She currently denies signs of infection. 1/4; patient presents for follow-up. She has been using Hydrofera Blue without  issues. She denies signs of infection. 1/11; patient presents for follow-up. She has been using Hydrofera Blue to the wound bed. She has no issues or complaints today and denies signs of infection. 1/25; patient presents for follow-up. She has been using Hydrofera Blue to the wound bed with no issues. She reports improvement in wound healing. She denies signs of infection. 2/8; patient presents for follow-up. She continues to use [facility] with no issues. She is pleased with her wound healing. She denies signs of infection. Electronic Signature(s) Signed: 11/04/2021 12:22:13 PM By: Kalman Shan DO Entered By: Kalman Shan on 11/04/2021 12:16:12 Caitlin Jenkins ([id number]) -------------------------------------------------------------------------------- Physical Exam Details Patient Name: Caitlin Jenkins Date of Service: 11/03/2021 2:45 PM Medical Record Number: [id number] Patient Account Number: [id number] Date of Birth/Sex: March 08, 1932 (88 y.o. F) Treating RN: Carlene Coria Primary Care Provider: Delsa Grana Other Clinician: Referring Provider: Delsa Grana Treating Provider/Extender: Yaakov Guthrie in Treatment: 6 Constitutional . Cardiovascular . Psychiatric . Notes Right lower extremity: To the anterior aspect there is a small open wound with granulation tissue. Appears well-healing. No signs of infection. Electronic Signature(s) Signed: 11/04/2021 12:22:13 PM By: Kalman Shan DO Entered By: Kalman Shan on 11/04/2021 12:17:08 Caitlin Jenkins ([id number]) -------------------------------------------------------------------------------- Physician Orders Details Patient Name: Caitlin Jenkins Date of Service: 11/03/2021 2:45 PM Medical Record Number: [id number] Patient Account Number: [id number] Date of Birth/Sex: 1932/07/27 (88 y.o. F) Treating RN: Carlene Coria Primary Care Provider: Delsa Grana Other Clinician: Referring Provider: Delsa Grana Treating Provider/Extender: Yaakov Guthrie in Treatment: 6 Verbal / Phone Orders: No Diagnosis Coding Follow-up Appointments o Return Appointment in 1 week. o Nurse Visit as needed Bathing/ Shower/ Hygiene o Clean wound with Normal Saline or wound cleanser. - change dressing after shower or keep it dry o No tub bath. Anesthetic (Use 'Patient Medications' Section for Anesthetic Order Entry) o Lidocaine applied to wound bed Edema Control -  Lymphedema / Segmental Compressive Device / Other o Elevate leg(s) parallel to the floor when sitting. o DO YOUR BEST to sleep in the bed at night. DO NOT sleep in your recliner. Long hours of sitting in a recliner leads to swelling of the legs and/or potential wounds on your backside. Additional Orders / Instructions o Follow Nutritious Diet and Increase Protein Intake Wound Treatment Wound #1 - Lower Leg Wound Laterality: Right, Anterior Cleanser: Normal Saline (Generic) 1 x Per Day/30 Days Discharge Instructions: Wash your hands with soap and water. Remove old dressing, discard into plastic bag and place into trash. Cleanse the wound with Normal Saline prior to applying a clean dressing using gauze sponges, not tissues or cotton balls. Do not scrub or use excessive force. Pat dry using gauze sponges, not tissue or cotton balls. Cleanser: Soap and Water 1 x Per Day/30 Days Discharge Instructions: Gently cleanse wound with antibacterial soap, rinse and pat dry prior to dressing wounds Primary Dressing: Xeroform 4x4-HBD (in/in) 1 x Per Day/30 Days Discharge Instructions: Apply Xeroform 4x4-HBD (in/in) as directed Secondary Dressing: Zetuvit Plus Silicone Border Dressing 4x4 (in/in) (Generic) 1 x Per Day/30 Days Electronic Signature(s) Signed: 11/04/2021 12:22:13 PM By: Kalman Shan DO Previous Signature: 11/04/2021 8:26:07 AM Version By: Carlene Coria RN Entered By: Kalman Shan on 11/04/2021 12:21:06 Caitlin Jenkins ([id number]) -------------------------------------------------------------------------------- Problem List Details Patient Name: Caitlin Jenkins Date of Service: 11/03/2021 2:45 PM Medical Record Number: [id number] Patient Account Number: [id number] Date of Birth/Sex: 1932-01-19 (88 y.o. F) Treating RN: Carlene Coria Primary Care Provider: Delsa Grana Other Clinician: Referring Provider: Delsa Grana Treating Provider/Extender: Yaakov Guthrie in Treatment: 6 Active Problems ICD-10 Encounter Code Description Active Date MDM Diagnosis [phone] Non-pressure chronic ulcer of other part of right lower leg with fat layer 09/22/2021 No Yes exposed E03.9 Hypothyroidism, unspecified 09/22/2021 No Yes I10 Essential (primary) hypertension 09/22/2021 No Yes K74.3 Primary biliary cirrhosis 09/22/2021 No Yes T79.8XXA Other early complications of trauma, initial encounter 09/22/2021 No Yes Inactive Problems Resolved Problems Electronic Signature(s) Signed: 11/04/2021 12:22:13 PM By: Kalman Shan DO Entered By: Kalman Shan on 11/04/2021 12:14:16 Caitlin Jenkins ([id number]) -------------------------------------------------------------------------------- Progress Note Details Patient Name: Caitlin Jenkins Date of Service: 11/03/2021 2:45 PM Medical Record Number: [id number] Patient Account Number: [id number] Date of Birth/Sex: Mar 29, 1932 (88 y.o. F) Treating RN: Carlene Coria Primary Care Provider: Delsa Grana Other Clinician: Referring Provider: Delsa Grana Treating Provider/Extender: Yaakov Guthrie in Treatment: 6 Subjective Chief Complaint Information obtained from Patient Right lower extremity wound caused by trauma History of Present Illness (HPI) Admission 09/22/2021 Ms. 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 She states she hit her leg against the car door. She was initially seen in the ED on 12/2 for this issue and had 3 sutures in place and started on doxycycline. She followed up in the ED on 12/14 and had sutures removed. She has been placing antibiotic ointment on the wound bed daily. She currently denies signs of infection. 1/4; patient presents for follow-up. She has been using Hydrofera Blue without issues. She denies signs of infection. 1/11; patient presents for follow-up. She has been using Hydrofera Blue to the wound bed. She has no issues or complaints today and denies signs of infection. 1/25; patient presents for follow-up. She has been using  Hydrofera Blue to the wound bed with no issues. She reports improvement in wound healing. She denies signs of infection. 2/8; patient presents for follow-up. She continues to use [facility] with no issues. She is pleased with her wound healing. She denies signs of infection. Objective Constitutional Vitals Time Taken: 3:01 PM, Height: 64 in, Weight: 150 lbs, BMI: 25.7, Temperature: 98.2 F, Pulse: 80 bpm, Respiratory Rate: 18 breaths/min, Blood Pressure: 167/91 mmHg. General Notes: Right lower extremity: To the anterior aspect there is a small open wound with granulation tissue. Appears well-healing. No signs of infection. Integumentary (Hair, Skin) Wound #1 status is Open. Original cause of wound was Laceration. The date acquired was: 08/27/2021. The wound has been in treatment 6 weeks. The wound is located on the Right,Anterior Lower Leg. The wound measures 0.1cm length x 0.1cm width x 0.1cm depth; 0.008cm^2 area and 0.001cm^3 volume. There is Fat Layer (Subcutaneous Tissue) exposed. There is no tunneling or undermining noted. There is a medium amount of serosanguineous drainage noted. There is medium (34-66%) red, pink granulation within the  wound bed. There is a medium (34-66%) amount of necrotic tissue within the wound bed. Assessment Active Problems ICD-10 Non-pressure chronic ulcer of other part of right lower leg with fat layer exposed Hypothyroidism, unspecified Essential (primary) hypertension Primary biliary cirrhosis Other early complications of trauma, initial encounter KHALIE, WINCE. ([id number]) Patient's wound has shown improvement in size and appearance since last clinic visit. I recommended switching to Xeroform with dressing changes as the wound appears almost closed. Follow-up in 1 week. Plan Follow-up Appointments: Return Appointment in 1 week. Nurse Visit as needed Bathing/ Shower/ Hygiene: Clean wound with Normal Saline or wound cleanser. - change dressing after shower or keep it dry No tub bath. Anesthetic (Use 'Patient Medications' Section for Anesthetic Order Entry): Lidocaine applied to wound bed Edema Control - Lymphedema / Segmental Compressive Device / Other: Elevate leg(s) parallel to the floor when sitting. DO YOUR BEST to sleep in the bed at night. DO NOT sleep in your recliner. Long hours of sitting in a recliner leads to swelling of the legs and/or potential wounds on your backside. Additional Orders / Instructions: Follow Nutritious Diet and Increase Protein Intake WOUND #1: - Lower Leg Wound Laterality: Right, Anterior Cleanser: Normal Saline (Generic) 1 x Per Day/30 Days Discharge Instructions: Wash your hands with soap and water. Remove old dressing, discard into plastic bag and place into trash. Cleanse the wound with Normal Saline prior to applying a clean dressing using gauze sponges, not tissues or cotton balls. Do not scrub or use excessive force. Pat dry using gauze sponges, not tissue or cotton balls. Cleanser: Soap and Water 1 x Per Day/30 Days Discharge Instructions: Gently cleanse wound with antibacterial soap, rinse and pat dry prior to dressing wounds Primary  Dressing: Xeroform 4x4-HBD (in/in) 1 x Per Day/30 Days Discharge Instructions: Apply Xeroform 4x4-HBD (in/in) as directed Secondary Dressing: Zetuvit Plus Silicone Border Dressing 4x4 (in/in) (Generic) 1 x Per Day/30 Days 1. Xeroform 2. Follow-up in 1 week Electronic Signature(s) Signed: 11/04/2021 12:22:13 PM By: Kalman Shan DO Entered By: Kalman Shan on 11/04/2021 12:19:26 Caitlin Jenkins ([id number]) -------------------------------------------------------------------------------- [place] Details Patient Name: Caitlin Jenkins Date of Service: 11/03/2021 Medical Record Number: [id number] Patient Account Number: [id number] Date of Birth/Sex: 17-Nov-1931 (88 y.o. F) Treating RN: Carlene Coria Primary Care Provider: Delsa Grana Other Clinician: Referring Provider: Delsa Grana Treating Provider/Extender: Yaakov Guthrie in Treatment: 6 Diagnosis Coding ICD-10 Codes Code Description [phone] Non-pressure  chronic ulcer of other part of right lower leg with fat layer exposed E03.9 Hypothyroidism, unspecified I10 Essential (primary) hypertension K74.3 Primary biliary cirrhosis T79.8XXA Other early complications of trauma, initial encounter Facility Procedures CPT4 Code: [id number] Description: [phone] - WOUND CARE VISIT-LEV 2 EST PT Modifier: Quantity: 1 Physician Procedures CPT4 Code: [id number] Description: 81157 - WC PHYS LEVEL 3 - EST PT Modifier: Quantity: 1 CPT4 Code: Description: ICD-10 Diagnosis Description W62.035 Non-pressure chronic ulcer of other part of right lower leg with fat la T79.8XXA Other early complications of trauma, initial encounter E03.9 Hypothyroidism, unspecified I10 Essential (primary)  hypertension Modifier: yer exposed Quantity: Electronic Signature(s) Signed: 11/04/2021 12:22:13 PM By: Kalman Shan DO Previous Signature: 11/04/2021 8:26:07 AM Version By: Carlene Coria RN Entered By: Kalman Shan on 11/04/2021 12:20:42

## 2021-11-04 NOTE — Telephone Encounter (Signed)
Requested Prescriptions  Pending Prescriptions Disp Refills   amLODipine (NORVASC) 5 MG tablet 30 tablet 0    Sig: Take 1 tablet (5 mg total) by mouth daily.     Cardiovascular: Calcium Channel Blockers 2 Failed - 11/04/2021  1:02 PM      Failed - Last BP in normal range    BP Readings from Last 1 Encounters:  11/02/21 (!) 170/90         Passed - Last Heart Rate in normal range    Pulse Readings from Last 1 Encounters:  11/02/21 96         Passed - Valid encounter within last 6 months    Recent Outpatient Visits          2 days ago Essential hypertension, benign   [facility], [place], NP   3 months ago 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 [facility])   [place], DO   6 months ago Essential hypertension, benign   [place], DO   7 months ago Encounter for screening involving social determinants of health (SDoH)   [place], NP   7 months ago Encounter for examination following treatment at [facility] Delsa Grana, PA-C      Future Appointments            In 3 months  [facility], [place]   In 5 months Teodora Medici, [facility], [facility]

## 2021-11-04 NOTE — Progress Notes (Addendum)
Caitlin Jenkins ([id number]) Visit Report for 11/03/2021 Arrival Information Details Patient Name: Caitlin Jenkins Date of Service: 11/03/2021 2:45 PM Medical Record Number: [id number] Patient Account Number: [id number] Date of Birth/Sex: 10/12/1931 (88 y.o. F) Treating RN: Caitlin Jenkins Primary Care Caitlin Jenkins: Caitlin Jenkins Other Clinician: Referring Caitlin Jenkins: Caitlin Jenkins Treating Caitlin Jenkins/Extender: Caitlin Jenkins in Treatment: 6 Visit Information History Since Last Visit All ordered tests and consults were completed: No Patient Arrived: Ambulatory Added or deleted any medications: No Arrival Time: 14:58 Any new allergies or adverse reactions: No Accompanied By: daughter Had a fall or experienced change in No Transfer Assistance: None activities of daily living that may affect Patient Identification Verified: Yes risk of falls: Secondary Verification Process Completed: Yes Signs or symptoms of abuse/neglect since last visito No Patient Requires Transmission-Based Precautions: No Hospitalized since last visit: No Patient Has Alerts: Yes Implantable device outside of the clinic excluding No Patient Alerts: NOT diabetic cellular tissue based products placed in the center since last visit: Has Dressing in Place as Prescribed: Yes Pain Present Now: No Electronic Signature(s) Signed: 11/04/2021 8:26:07 AM By: Caitlin Coria RN Entered By: Caitlin Jenkins on 11/03/2021 15:01:27 Caitlin Jenkins ([id number]) -------------------------------------------------------------------------------- Clinic Level of Care Assessment Details Patient Name: Caitlin Jenkins Date of Service: 11/03/2021 2:45 PM Medical Record Number: [id number] Patient Account Number: [id number] Date of Birth/Sex: 1931-11-01 (88 y.o. F) Treating RN: Caitlin Jenkins Primary Care Corbyn Wildey: Caitlin Jenkins Other Clinician: Referring Nicha Hemann: Caitlin Jenkins Treating Caitlin Jenkins/Extender: Caitlin Jenkins in  Treatment: 6 Clinic Level of Care Assessment Items TOOL 4 Quantity Score X - Use when only an EandM is performed on FOLLOW-UP visit 1 0 ASSESSMENTS - Nursing Assessment / Reassessment X - Reassessment of Co-morbidities (includes updates in patient status) 1 10 X- 1 5 Reassessment of Adherence to Treatment Plan ASSESSMENTS - Wound and Skin Assessment / Reassessment X - Simple Wound Assessment / Reassessment - one wound 1 5 []  - 0 Complex Wound Assessment / Reassessment - multiple wounds []  - 0 Dermatologic / Skin Assessment (not related to wound area) ASSESSMENTS - Focused Assessment []  - Circumferential Edema Measurements - multi extremities 0 []  - 0 Nutritional Assessment / Counseling / Intervention []  - 0 Lower Extremity Assessment (monofilament, tuning fork, pulses) []  - 0 Peripheral Arterial Disease Assessment (using hand held doppler) ASSESSMENTS - Ostomy and/or Continence Assessment and Care []  - Incontinence Assessment and Management 0 []  - 0 Ostomy Care Assessment and Management (repouching, etc.) PROCESS - Coordination of Care X - Simple Patient / Family Education for ongoing care 1 15 []  - 0 Complex (extensive) Patient / Family Education for ongoing care []  - 0 Staff obtains Programmer, systems, Records, Test Results / Process Orders []  - 0 Staff telephones HHA, Nursing Homes / Clarify orders / etc []  - 0 Routine Transfer to another Facility (non-emergent condition) []  - 0 Routine Hospital Admission (non-emergent condition) []  - 0 New Admissions / Biomedical engineer / Ordering NPWT, Apligraf, etc. []  - 0 Emergency Hospital Admission (emergent condition) X- 1 10 Simple Discharge Coordination []  - 0 Complex (extensive) Discharge Coordination PROCESS - Special Needs []  - Pediatric / Minor Patient Management 0 []  - 0 Isolation Patient Management []  - 0 Hearing / Language / Visual special needs []  - 0 Assessment of Community assistance (transportation, D/C  planning, etc.) []  - 0 Additional assistance / Altered mentation []  - 0 Support Surface(s) Assessment (bed, cushion, seat, etc.) INTERVENTIONS - Wound Cleansing / Measurement Pantaleo, Ama W. ([id number]) X- 1 5  Simple Wound Cleansing - one wound []  - 0 Complex Wound Cleansing - multiple wounds []  - 0 Wound Imaging (photographs - any number of wounds) []  - 0 Wound Tracing (instead of photographs) []  - 0 Simple Wound Measurement - one wound []  - 0 Complex Wound Measurement - multiple wounds INTERVENTIONS - Wound Dressings X - Small Wound Dressing one or multiple wounds 1 10 []  - 0 Medium Wound Dressing one or multiple wounds []  - 0 Large Wound Dressing one or multiple wounds X- 1 5 Application of Medications - topical []  - 0 Application of Medications - injection INTERVENTIONS - Miscellaneous []  - External ear exam 0 []  - 0 Specimen Collection (cultures, biopsies, blood, body fluids, etc.) []  - 0 Specimen(s) / Culture(s) sent or taken to Lab for analysis []  - 0 Patient Transfer (multiple staff / Civil Service fast streamer / Similar devices) []  - 0 Simple Staple / Suture removal (25 or less) []  - 0 Complex Staple / Suture removal (26 or more) []  - 0 Hypo / Hyperglycemic Management (close monitor of Blood Glucose) []  - 0 Ankle / Brachial Index (ABI) - do not check if billed separately X- 1 5 Vital Signs Has the patient been seen at the hospital within the last three years: Yes Total Score: 70 Level Of Care: New/Established - Level 2 Electronic Signature(s) Signed: 11/04/2021 8:26:07 AM By: Caitlin Coria RN Entered By: Caitlin Jenkins on 11/03/2021 15:12:46 Caitlin Jenkins ([id number]) -------------------------------------------------------------------------------- Encounter Discharge Information Details Patient Name: Caitlin Jenkins Date of Service: 11/03/2021 2:45 PM Medical Record Number: [id number] Patient Account Number: [id number] Date of Birth/Sex: 1931-10-11 (88 y.o.  F) Treating RN: Caitlin Jenkins Primary Care Deserie Dirks: Caitlin Jenkins Other Clinician: Referring Caitlin Jenkins: Caitlin Jenkins Treating Caitlin Jenkins/Extender: Caitlin Jenkins in Treatment: 6 Encounter Discharge Information Items Discharge Condition: Stable Ambulatory Status: Ambulatory Discharge Destination: Home Transportation: Private Auto Accompanied By: daughter Schedule Follow-up Appointment: Yes Clinical Summary of Care: Patient Declined Electronic Signature(s) Signed: 11/04/2021 8:26:07 AM By: Caitlin Coria RN Entered By: Caitlin Jenkins on 11/03/2021 15:18:14 Caitlin Jenkins ([id number]) -------------------------------------------------------------------------------- Lower Extremity Assessment Details Patient Name: Caitlin Jenkins Date of Service: 11/03/2021 2:45 PM Medical Record Number: [id number] Patient Account Number: [id number] Date of Birth/Sex: [birth year] (88 y.o. F) Treating RN: Caitlin Jenkins Primary Care Jarelle Ates: Caitlin Jenkins Other Clinician: Referring Marvell Tamer: Caitlin Jenkins Treating Nur Rabold/Extender: Caitlin Jenkins in Treatment: 6 Vascular Assessment Pulses: Dorsalis Pedis Palpable: [Right:Yes] Electronic Signature(s) Signed: 11/04/2021 8:26:07 AM By: Caitlin Coria RN Entered By: Caitlin Jenkins on 11/03/2021 15:08:41 Caitlin Jenkins ([id number]) -------------------------------------------------------------------------------- Multi Wound Chart Details Patient Name: Caitlin Jenkins Date of Service: 11/03/2021 2:45 PM Medical Record Number: [id number] Patient Account Number: [id number] Date of Birth/Sex: 08/04/1932 (88 y.o. F) Treating RN: Caitlin Jenkins Primary Care Felicia Both: Caitlin Jenkins Other Clinician: Referring Eluterio Seymour: Caitlin Jenkins Treating Errin Chewning/Extender: Caitlin Jenkins in Treatment: 6 Vital Signs Height(in): 64 Pulse(bpm): 80 Weight(lbs): 150 Blood Pressure(mmHg): 167/91 Body Mass Index(BMI): 25.7 Temperature(F): 98.2 Respiratory  Rate(breaths/min): 18 Photos: [N/A:N/A] Wound Location: Right, Anterior Lower Leg N/A N/A Wounding Event: Laceration N/A N/A Primary Etiology: Venous Leg Ulcer N/A N/A Secondary Etiology: Trauma, Other N/A N/A Comorbid History: Coronary Artery Disease, N/A N/A Hypertension, Cirrhosis , Gout Date Acquired: 08/27/2021 N/A N/A Weeks of Treatment: 6 N/A N/A Wound Status: Open N/A N/A Wound Recurrence: No N/A N/A Measurements L x W x D (cm) 0.1x0.1x0.1 N/A N/A Area (cm) : 0.008 N/A N/A Volume (cm) : 0.001 N/A N/A % Reduction in Area: 99.30% N/A N/A %  Reduction in Volume: 99.10% N/A N/A Classification: Full Thickness Without Exposed N/A N/A Support Structures Exudate Amount: Medium N/A N/A Exudate Type: Serosanguineous N/A N/A Exudate Color: red, brown N/A N/A Granulation Amount: Medium (34-66%) N/A N/A Granulation Quality: Red, Pink N/A N/A Necrotic Amount: Medium (34-66%) N/A N/A Exposed Structures: Fat Layer (Subcutaneous Tissue): N/A N/A Yes Fascia: No Tendon: No Muscle: No Joint: No Bone: No Epithelialization: None N/A N/A Treatment Notes Electronic Signature(s) Signed: 11/04/2021 8:26:07 AM By: Caitlin Coria RN Entered By: Caitlin Jenkins on 11/03/2021 15:10:43 Caitlin Jenkins ([id number]) -------------------------------------------------------------------------------- [place] Details Patient Name: Caitlin Jenkins Date of Service: 11/03/2021 2:45 PM Medical Record Number: [id number] Patient Account Number: [id number] Date of Birth/Sex: 1932/05/19 (88 y.o. F) Treating RN: Caitlin Jenkins Primary Care Reynald Woods: Caitlin Jenkins Other Clinician: Referring Marnell Mcdaniel: Caitlin Jenkins Treating Sonjia Wilcoxson/Extender: Caitlin Jenkins in Treatment: 6 Active Inactive Wound/Skin Impairment Nursing Diagnoses: Impaired tissue integrity Knowledge deficit related to smoking impact on wound healing Knowledge deficit related to ulceration/compromised skin  integrity Goals: Patient/caregiver will verbalize understanding of skin care regimen Date Initiated: 09/22/2021 Date Inactivated: 10/20/2021 Target Resolution Date: 10/01/2021 Goal Status: Met Ulcer/skin breakdown will have a volume reduction of 30% by week 4 Date Initiated: 09/22/2021 Date Inactivated: 10/20/2021 Target Resolution Date: 10/20/2021 Goal Status: Met Ulcer/skin breakdown will have a volume reduction of 50% by week 8 Date Initiated: 09/22/2021 Target Resolution Date: 11/17/2021 Goal Status: Active Ulcer/skin breakdown will have a volume reduction of 80% by week 12 Date Initiated: 09/22/2021 Target Resolution Date: 12/08/2021 Goal Status: Active Ulcer/skin breakdown will heal within 14 weeks Date Initiated: 09/22/2021 Target Resolution Date: 12/22/2021 Goal Status: Active Interventions: Assess patient/caregiver ability to obtain necessary supplies Assess patient/caregiver ability to perform ulcer/skin care regimen upon admission and as needed Assess ulceration(s) every visit Notes: Electronic Signature(s) Signed: 11/04/2021 8:26:07 AM By: Caitlin Coria RN Entered By: Caitlin Jenkins on 11/03/2021 15:10:30 Caitlin Jenkins ([id number]) -------------------------------------------------------------------------------- Pain Assessment Details Patient Name: Caitlin Jenkins Date of Service: 11/03/2021 2:45 PM Medical Record Number: [id number] Patient Account Number: [id number] Date of Birth/Sex: 05-03-1932 (88 y.o. F) Treating RN: Caitlin Jenkins Primary Care Tarissa Kerin: Caitlin Jenkins Other Clinician: Referring Neilah Fulwider: Caitlin Jenkins Treating Jamillia Closson/Extender: Caitlin Jenkins in Treatment: 6 Active Problems Location of Pain Severity and Description of Pain Patient Has Paino No Site Locations Pain Management and Medication Current Pain Management: Electronic Signature(s) Signed: 11/04/2021 8:26:07 AM By: Caitlin Coria RN Entered By: Caitlin Jenkins on 11/03/2021  15:02:01 Caitlin Jenkins ([id number]) -------------------------------------------------------------------------------- Patient/Caregiver Education Details Patient Name: Caitlin Jenkins Date of Service: 11/03/2021 2:45 PM Medical Record Number: [id number] Patient Account Number: [id number] Date of Birth/Gender: 07-21-1932 (88 y.o. F) Treating RN: Caitlin Jenkins Primary Care Physician: Caitlin Jenkins Other Clinician: Referring Physician: Delsa Jenkins Treating Physician/Extender: Caitlin Jenkins in Treatment: 6 Education Assessment Education Provided To: Patient Education Topics Provided Wound/Skin Impairment: Methods: Explain/Verbal Responses: State content correctly Electronic Signature(s) Signed: 11/04/2021 8:26:07 AM By: Caitlin Coria RN Entered By: Caitlin Jenkins on 11/03/2021 15:17:36 Caitlin Jenkins ([id number]) -------------------------------------------------------------------------------- Wound Assessment Details Patient Name: Caitlin Jenkins Date of Service: 11/03/2021 2:45 PM Medical Record Number: [id number] Patient Account Number: [id number] Date of Birth/Sex: July 16, 1932 (88 y.o. F) Treating RN: Caitlin Jenkins Primary Care Lakai Moree: Caitlin Jenkins Other Clinician: Referring Cindy Fullman: Caitlin Jenkins Treating Quest Tavenner/Extender: Caitlin Jenkins in Treatment: 6 Wound Status Wound Number: 1 Primary Etiology: Venous Leg Ulcer Wound Location: Right, Anterior Lower Leg Secondary Trauma, Other Etiology: Wounding Event: Laceration Wound Status: Open Date Acquired:  08/27/2021 Comorbid History: Coronary Artery Disease, Hypertension, Cirrhosis , Weeks Of Treatment: 6 Gout Clustered Wound: No Photos Wound Measurements Length: (cm) 0.1 Width: (cm) 0.1 Depth: (cm) 0.1 Area: (cm) 0.008 Volume: (cm) 0.001 % Reduction in Area: 99.3% % Reduction in Volume: 99.1% Epithelialization: None Tunneling: No Undermining: No Wound Description Classification: Full  Thickness Without Exposed Support Structures Exudate Amount: Medium Exudate Type: Serosanguineous Exudate Color: red, brown Foul Odor After Cleansing: No Slough/Fibrino Yes Wound Bed Granulation Amount: Medium (34-66%) Exposed Structure Granulation Quality: Red, Pink Fascia Exposed: No Necrotic Amount: Medium (34-66%) Fat Layer (Subcutaneous Tissue) Exposed: Yes Tendon Exposed: No Muscle Exposed: No Joint Exposed: No Bone Exposed: No Treatment Notes Wound #1 (Lower Leg) Wound Laterality: Right, Anterior Cleanser Normal Saline Discharge Instruction: Wash your hands with soap and water. Remove old dressing, discard into plastic bag and place into trash. Cleanse the wound with Normal Saline prior to applying a clean dressing using gauze sponges, not tissues or cotton balls. Do not scrub or use excessive force. Pat dry using gauze sponges, not tissue or cotton balls. Soap and [address] NORAH, DEVIN ([id number]) Discharge Instruction: Gently cleanse wound with antibacterial soap, rinse and pat dry prior to dressing wounds Peri-Wound Care Skin Prep Discharge Instruction: Use skin prep as directed PRN Topical Primary Dressing Xeroform 4x4-HBD (in/in) Discharge Instruction: Apply Xeroform 4x4-HBD (in/in) as directed Secondary Dressing Zetuvit Plus Silicone Border Dressing 4x4 (in/in) Secured With Compression Wrap Compression Stockings Add-Ons Electronic Signature(s) Signed: 11/04/2021 8:26:07 AM By: Caitlin Coria RN Entered By: Caitlin Jenkins on 11/03/2021 15:10:09 Caitlin Jenkins ([id number]) -------------------------------------------------------------------------------- [place] Details Patient Name: Caitlin Jenkins Date of Service: 11/03/2021 2:45 PM Medical Record Number: [id number] Patient Account Number: [id number] Date of Birth/Sex: 11/11/1931 (88 y.o. F) Treating RN: Caitlin Jenkins Primary Care Xiara Knisley: Caitlin Jenkins Other Clinician: Referring Bearl Talarico: Caitlin Jenkins Treating Evart Mcdonnell/Extender: Caitlin Jenkins in Treatment: 6 Vital Signs Time Taken: 15:01 Temperature (F): 98.2 Height (in): 64 Pulse (bpm): 80 Weight (lbs): 150 Respiratory Rate (breaths/min): 18 Body Mass Index (BMI): 25.7 Blood Pressure (mmHg): 167/91 Reference Range: 80 - 120 mg / dl Electronic Signature(s) Signed: 11/04/2021 8:26:07 AM By: Caitlin Coria RN Entered By: Caitlin Jenkins on 11/03/2021 15:01:50

## 2021-11-04 NOTE — Telephone Encounter (Signed)
Copied from [place] [id number]. Topic: Quick Communication - Rx Refill/Question >> Nov 04, 2021  8:34 AM Leward Quan A wrote: Medication: amLODipine (NORVASC) 5 MG tablet Request a 90 day supply please  Has the patient contacted their pharmacy? No. (Agent: If no, request that the patient contact the pharmacy for the refill. If patient does not wish to contact the pharmacy document the reason why and proceed with request.) (Agent: If yes, when and what did the pharmacy advise?)  Preferred Pharmacy (with phone number or street name): [facility]) - [place], [place]  Phone:  [phone] Fax:  [phone]    Has the patient been seen for an appointment in the last year OR does the patient have an upcoming appointment? Yes.    Agent: Please be advised that RX refills may take up to 3 business days. We ask that you follow-up with your pharmacy.

## 2021-11-08 DIAGNOSIS — I739 Peripheral vascular disease, unspecified: Secondary | ICD-10-CM | POA: Diagnosis not present

## 2021-11-08 DIAGNOSIS — F0390 Unspecified dementia without behavioral disturbance: Secondary | ICD-10-CM | POA: Diagnosis not present

## 2021-11-08 DIAGNOSIS — J449 Chronic obstructive pulmonary disease, unspecified: Secondary | ICD-10-CM | POA: Diagnosis not present

## 2021-11-08 DIAGNOSIS — N2581 Secondary hyperparathyroidism of renal origin: Secondary | ICD-10-CM | POA: Diagnosis not present

## 2021-11-08 DIAGNOSIS — K7460 Unspecified cirrhosis of liver: Secondary | ICD-10-CM | POA: Diagnosis not present

## 2021-11-08 DIAGNOSIS — N183 Chronic kidney disease, stage 3 unspecified: Secondary | ICD-10-CM | POA: Diagnosis not present

## 2021-11-09 DIAGNOSIS — C44319 Basal cell carcinoma of skin of other parts of face: Secondary | ICD-10-CM | POA: Diagnosis not present

## 2021-11-10 ENCOUNTER — Telehealth: Payer: Self-pay

## 2021-11-10 ENCOUNTER — Encounter (HOSPITAL_BASED_OUTPATIENT_CLINIC_OR_DEPARTMENT_OTHER): Payer: PPO | Admitting: Internal Medicine

## 2021-11-10 ENCOUNTER — Other Ambulatory Visit: Payer: Self-pay

## 2021-11-10 DIAGNOSIS — E039 Hypothyroidism, unspecified: Secondary | ICD-10-CM

## 2021-11-10 DIAGNOSIS — L97812 Non-pressure chronic ulcer of other part of right lower leg with fat layer exposed: Secondary | ICD-10-CM | POA: Diagnosis not present

## 2021-11-10 DIAGNOSIS — I10 Essential (primary) hypertension: Secondary | ICD-10-CM

## 2021-11-10 DIAGNOSIS — T798XXA Other early complications of trauma, initial encounter: Secondary | ICD-10-CM | POA: Diagnosis not present

## 2021-11-10 NOTE — Progress Notes (Signed)
ANTIONETTA, ATOR ([id number]) Visit Report for 11/10/2021 Chief Complaint Document Details Patient Name: Caitlin Jenkins Date of Service: 11/10/2021 11:30 AM Medical Record Number: [id number] Patient Account Number: [id number] Date of Birth/Sex: Sep 11, 1932 (88 y.o. F) Treating RN: Donnamarie Poag Primary Care Provider: Delsa Grana Other Clinician: Referring Provider: Delsa Grana Treating Provider/Extender: Yaakov Guthrie in Treatment: 7 Information Obtained from: Patient Chief Complaint Right lower extremity wound caused by trauma Electronic Signature(s) Signed: 11/10/2021 1:16:45 PM By: Kalman Shan DO Entered By: Kalman Shan on 11/10/2021 13:13:24 Caitlin Jenkins ([id number]) -------------------------------------------------------------------------------- HPI Details Patient Name: Caitlin Jenkins Date of Service: 11/10/2021 11:30 AM Medical Record Number: [id number] Patient Account Number: [id number] Date of Birth/Sex: 12-Jan-1932 (88 y.o. 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 She states she hit her leg against the car door. She was initially seen in the ED on 12/2 for this issue and had 3 sutures in place and started on doxycycline. She followed up in the ED on 12/14 and had sutures removed. She has been placing antibiotic ointment on the wound bed daily. She currently denies signs of infection. 1/4; patient presents for follow-up. She has been using Hydrofera Blue without  issues. She denies signs of infection. 1/11; patient presents for follow-up. She has been using Hydrofera Blue to the wound bed. She has no issues or complaints today and denies signs of infection. 1/25; patient presents for follow-up. She has been using Hydrofera Blue to the wound bed with no issues. She reports improvement in wound healing. She denies signs of infection. 2/8; patient presents for follow-up. She continues to use [facility] with no issues. She is pleased with her wound healing. She denies signs of infection. 2/15; patient presents for follow-up. She states that her wound has healed. She has no issues or complaints today. Electronic Signature(s) Signed: 11/10/2021 1:16:45 PM By: Kalman Shan DO Entered By: Kalman Shan on 11/10/2021 13:13:46 Caitlin Jenkins ([id number]) -------------------------------------------------------------------------------- Physical Exam Details Patient Name: Caitlin Jenkins Date of Service: 11/10/2021 11:30 AM Medical Record Number: [id number] Patient Account Number: [id number] Date of Birth/Sex: [birth year] (88 y.o. F) Treating RN: Donnamarie Poag Primary Care Provider: Delsa Grana Other Clinician: Referring Provider: Delsa Grana Treating Provider/Extender: Yaakov Guthrie in Treatment: 7 Constitutional . Cardiovascular . Psychiatric . Notes Right lower extremity: To the anterior aspect there is epithelization to the previous wound site. Surrounding skin is intact. Electronic Signature(s) Signed: 11/10/2021 1:16:45 PM By: Kalman Shan DO Entered By: Kalman Shan on 11/10/2021 13:14:19 Caitlin Jenkins ([id number]) -------------------------------------------------------------------------------- Physician Orders Details Patient Name: Caitlin Jenkins Date of Service: 11/10/2021 11:30 AM Medical Record Number: [id number] Patient Account Number: [id number] Date of Birth/Sex: 28-Mar-1932 (88 y.o. F) Treating  RN: Donnamarie Poag Primary Care Provider: Delsa Grana Other Clinician: Referring Provider: Delsa Grana Treating Provider/Extender: Yaakov Guthrie in Treatment: 7 Verbal / Phone Orders: No Diagnosis Coding Discharge From [facility] Services o Discharge from [facility] Complete o Moisturize legs daily after removing compression garments. - Recommend Eucerin or Cerave or Lubriderm o Elevate, Exercise Daily and Avoid Standing for Long Periods of Time. Additional  Orders / Instructions o Other: - keep newly healed skin covered with a dressing and/or a sock to protect it for about 2 weeks Electronic Signature(s) Signed: 11/10/2021 1:16:45 PM By: Kalman Shan DO Previous Signature: 11/10/2021 12:16:10 PM Version By: Donnamarie Poag Entered By: Kalman Shan on 11/10/2021 13:16:13 Caitlin Jenkins ([id number]) -------------------------------------------------------------------------------- Problem List Details Patient Name: Caitlin Jenkins Date of Service: 11/10/2021 11:30 AM Medical Record Number: [id number] Patient Account Number: [id number] Date of Birth/Sex: 08/13/1932 (88 y.o. F) Treating RN: Donnamarie Poag Primary Care Provider: Delsa Grana Other Clinician: Referring Provider: Delsa Grana Treating Provider/Extender: Yaakov Guthrie in Treatment: 7 Active Problems ICD-10 Encounter Code Description Active Date MDM Diagnosis [phone] Non-pressure chronic ulcer of other part of right lower leg with fat layer 09/22/2021 No Yes exposed E03.9 Hypothyroidism, unspecified 09/22/2021 No Yes I10 Essential (primary) hypertension 09/22/2021 No Yes K74.3 Primary biliary cirrhosis 09/22/2021 No Yes T79.8XXA Other early complications of trauma, initial encounter 09/22/2021 No Yes Inactive Problems Resolved Problems Electronic Signature(s) Signed: 11/10/2021 1:16:45 PM By: Kalman Shan DO Entered By: Kalman Shan on 11/10/2021 13:13:20 Caitlin Jenkins ([id number]) -------------------------------------------------------------------------------- Progress Note Details Patient Name: Caitlin Jenkins Date of Service: 11/10/2021 11:30 AM Medical Record Number: [id number] Patient Account Number: [id number] Date of Birth/Sex: Feb 15, 1932 (88 y.o. F) Treating RN: Donnamarie Poag Primary Care Provider: Delsa Grana Other Clinician: Referring Provider: Delsa Grana Treating Provider/Extender: Yaakov Guthrie in Treatment: 7 Subjective Chief Complaint Information obtained from Patient Right lower extremity wound caused by trauma History of Present Illness (HPI) Admission 09/22/2021 Ms. 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 She states she hit her leg against the car door. She was initially seen in the ED on 12/2 for this issue and had 3 sutures in place and started on doxycycline. She followed up in the ED on 12/14 and had sutures removed. She has been placing antibiotic ointment on the wound bed daily. She currently denies signs of infection. 1/4; patient presents for follow-up. She has been using Hydrofera Blue without issues. She denies signs of infection. 1/11; patient presents for follow-up. She has been using Hydrofera Blue to the wound bed. She has no issues or complaints today and denies signs of infection. 1/25; patient presents for follow-up. She has been using Hydrofera Blue to the wound bed with no issues. She reports improvement in wound healing. She denies signs of infection. 2/8; patient presents for follow-up. She continues to use [facility] with no issues. She is pleased with her wound healing. She denies signs of infection. 2/15; patient presents for follow-up. She states that  her wound has healed. She has no issues or complaints today. Objective Constitutional Vitals Time Taken: 11:35 AM, Height: 64 in, Weight: 150 lbs, BMI: 25.7, Temperature: 98.0 F, Pulse: 83 bpm, Respiratory Rate: 16 breaths/min, Blood Pressure: 177/89 mmHg. General Notes: Right lower extremity: To the anterior aspect there is epithelization to the previous wound site. Surrounding skin is intact. Integumentary (Hair, Skin) Wound #1 status is Healed - Epithelialized. Original cause of wound was Laceration. The date acquired was: 08/27/2021. The wound has been in treatment 7 weeks. The wound is located on the Right,Anterior Lower Leg. The wound measures 0cm length x 0cm width x 0cm depth; 0cm^2 area and 0cm^3 volume. There is a none present amount of  drainage noted. There is no granulation within the wound bed. There is no necrotic tissue within the wound bed. Assessment Active Problems ICD-10 Non-pressure chronic ulcer of other part of right lower leg with fat layer exposed Hypothyroidism, unspecified Essential (primary) hypertension Primary biliary cirrhosis Other early complications of trauma, initial encounter Caitlin Jenkins, Caitlin Jenkins. ([id number]) Patient has done well with Hydrofera Blue. Her wound has healed. I recommended using a foam border dressing to the area to help with padding and keeping the area protected For the 1-2 weeks. She can follow-up as needed. She knows to call with any questions or concerns. Plan Discharge From [facility] Services: Discharge from [place] [place] [facility] Complete Moisturize legs daily after removing compression garments. - Recommend Eucerin or Cerave or Lubriderm Elevate, Exercise Daily and Avoid Standing for Long Periods of Time. Additional Orders / Instructions: Other: - keep newly healed skin covered with a dressing and/or a sock to protect it for about 2 weeks 1. Discharge from clinic due to closed wound 2. Follow-up as needed Electronic  Signature(s) Signed: 11/10/2021 1:16:45 PM By: Kalman Shan DO Entered By: Kalman Shan on 11/10/2021 13:15:43 Caitlin Jenkins ([id number]) -------------------------------------------------------------------------------- SuperBill Details Patient Name: Caitlin Jenkins Date of Service: 11/10/2021 Medical Record Number: [id number] Patient Account Number: [id number] Date of Birth/Sex: [birth year] (88 y.o. F) Treating RN: Donnamarie Poag Primary Care Provider: Delsa Grana Other Clinician: Referring Provider: Delsa Grana Treating Provider/Extender: Yaakov Guthrie in Treatment: 7 Diagnosis Coding ICD-10 Codes Code Description [phone] Non-pressure chronic ulcer of other part of right lower leg with fat layer exposed E03.9 Hypothyroidism, unspecified I10 Essential (primary) hypertension K74.3 Primary biliary cirrhosis T79.8XXA Other early complications of trauma, initial encounter Facility Procedures CPT4 Code: [id number] Description: [phone] - WOUND CARE VISIT-LEV 2 EST PT Modifier: Quantity: 1 Physician Procedures CPT4 Code: 3016010 Description: 93235 - WC PHYS LEVEL 3 - EST PT Modifier: Quantity: 1 CPT4 Code: Description: ICD-10 Diagnosis Description T73.220 Non-pressure chronic ulcer of other part of right lower leg with fat la E03.9 Hypothyroidism, unspecified I10 Essential (primary) hypertension T79.8XXA Other early complications of trauma, initial  encounter Modifier: yer exposed Quantity: Electronic Signature(s) Signed: 11/10/2021 1:16:45 PM By: Kalman Shan DO Previous Signature: 11/10/2021 12:16:10 PM Version By: Donnamarie Poag Entered By: Kalman Shan on 11/10/2021 13:16:00

## 2021-11-10 NOTE — Progress Notes (Addendum)
SKYANN, GANIM ([id number]) Visit Report for 11/10/2021 Arrival Information Details Patient Name: Caitlin Jenkins Date of Service: 11/10/2021 11:30 AM Medical Record Number: [id number] Patient Account Number: [id number] Date of Birth/Sex: September 21, 1932 (88 y.o. F) Treating RN: Donnamarie Poag Primary Care Kelse Ploch: Delsa Grana Other Clinician: Referring Carita Sollars: Delsa Grana Treating Glenora Morocho/Extender: Yaakov Guthrie in Treatment: 7 Visit Information History Since Last Visit Added or deleted any medications: No Patient Arrived: Ambulatory Had a fall or experienced change in No Arrival Time: 11:34 activities of daily living that may affect Accompanied By: self risk of falls: Transfer Assistance: None Hospitalized since last visit: No Patient Identification Verified: Yes Has Dressing in Place as Prescribed: Yes Secondary Verification Process Completed: Yes Pain Present Now: No Patient Requires Transmission-Based Precautions: No Patient Has Alerts: Yes Patient Alerts: NOT diabetic Electronic Signature(s) Signed: 11/10/2021 12:16:10 PM By: Donnamarie Poag Entered By: Donnamarie Poag on 11/10/2021 11:36:10 Caitlin Jenkins ([id number]) -------------------------------------------------------------------------------- Clinic Level of Care Assessment Details Patient Name: Caitlin Jenkins Date of Service: 11/10/2021 11:30 AM Medical Record Number: [id number] Patient Account Number: [id number] Date of Birth/Sex: 02-16-1932 (88 y.o. F) Treating RN: Donnamarie Poag Primary Care Britteney Ayotte: Delsa Grana Other Clinician: Referring Rickell Wiehe: Delsa Grana Treating Roderica Cathell/Extender: Yaakov Guthrie in Treatment: 7 Clinic Level of Care Assessment Items TOOL 4 Quantity Score []  - Use when only an EandM is performed on FOLLOW-UP visit 0 ASSESSMENTS - Nursing Assessment / Reassessment []  - Reassessment of Co-morbidities (includes updates in patient status) 0 []  - 0 Reassessment of  Adherence to Treatment Plan ASSESSMENTS - Wound and Skin Assessment / Reassessment X - Simple Wound Assessment / Reassessment - one wound 1 5 []  - 0 Complex Wound Assessment / Reassessment - multiple wounds []  - 0 Dermatologic / Skin Assessment (not related to wound area) ASSESSMENTS - Focused Assessment []  - Circumferential Edema Measurements - multi extremities 0 []  - 0 Nutritional Assessment / Counseling / Intervention []  - 0 Lower Extremity Assessment (monofilament, tuning fork, pulses) []  - 0 Peripheral Arterial Disease Assessment (using hand held doppler) ASSESSMENTS - Ostomy and/or Continence Assessment and Care []  - Incontinence Assessment and Management 0 []  - 0 Ostomy Care Assessment and Management (repouching, etc.) PROCESS - Coordination of Care X - Simple Patient / Family Education for ongoing care 1 15 []  - 0 Complex (extensive) Patient / Family Education for ongoing care []  - 0 Staff obtains Programmer, systems, Records, Test Results / Process Orders []  - 0 Staff telephones HHA, Nursing Homes / Clarify orders / etc []  - 0 Routine Transfer to another Facility (non-emergent condition) []  - 0 Routine Hospital Admission (non-emergent condition) []  - 0 New Admissions / Biomedical engineer / Ordering NPWT, Apligraf, etc. []  - 0 Emergency Hospital Admission (emergent condition) X- 1 10 Simple Discharge Coordination []  - 0 Complex (extensive) Discharge Coordination PROCESS - Special Needs []  - Pediatric / Minor Patient Management 0 []  - 0 Isolation Patient Management []  - 0 Hearing / Language / Visual special needs []  - 0 Assessment of Community assistance (transportation, D/C planning, etc.) []  - 0 Additional assistance / Altered mentation []  - 0 Support Surface(s) Assessment (bed, cushion, seat, etc.) INTERVENTIONS - Wound Cleansing / Measurement Adamek, Monay W. ([id number]) X- 1 5 Simple Wound Cleansing - one wound []  - 0 Complex Wound Cleansing -  multiple wounds X- 1 5 Wound Imaging (photographs - any number of wounds) []  - 0 Wound Tracing (instead of photographs) X- 1 5 Simple Wound Measurement - one wound []  - 0  Complex Wound Measurement - multiple wounds INTERVENTIONS - Wound Dressings X - Small Wound Dressing one or multiple wounds 1 10 []  - 0 Medium Wound Dressing one or multiple wounds []  - 0 Large Wound Dressing one or multiple wounds []  - 0 Application of Medications - topical []  - 0 Application of Medications - injection INTERVENTIONS - Miscellaneous []  - External ear exam 0 []  - 0 Specimen Collection (cultures, biopsies, blood, body fluids, etc.) []  - 0 Specimen(s) / Culture(s) sent or taken to Lab for analysis []  - 0 Patient Transfer (multiple staff / Civil Service fast streamer / Similar devices) []  - 0 Simple Staple / Suture removal (25 or less) []  - 0 Complex Staple / Suture removal (26 or more) []  - 0 Hypo / Hyperglycemic Management (close monitor of Blood Glucose) []  - 0 Ankle / Brachial Index (ABI) - do not check if billed separately X- 1 5 Vital Signs Has the patient been seen at the hospital within the last three years: Yes Total Score: 60 Level Of Care: New/Established - Level 2 Electronic Signature(s) Signed: 11/10/2021 12:16:10 PM By: Donnamarie Poag Entered By: Donnamarie Poag on 11/10/2021 11:47:40 Caitlin Jenkins ([id number]) -------------------------------------------------------------------------------- Encounter Discharge Information Details Patient Name: Caitlin Jenkins Date of Service: 11/10/2021 11:30 AM Medical Record Number: [id number] Patient Account Number: [id number] Date of Birth/Sex: Nov 02, 1931 (88 y.o. F) Treating RN: Donnamarie Poag Primary Care Amity Roes: Delsa Grana Other Clinician: Referring Media Pizzini: Delsa Grana Treating Kimberlyann Hollar/Extender: Yaakov Guthrie in Treatment: 7 Encounter Discharge Information Items Discharge Condition: Stable Ambulatory Status:  Ambulatory Discharge Destination: Home Transportation: Private Auto Accompanied By: daughter Schedule Follow-up Appointment: No Clinical Summary of Care: Electronic Signature(s) Signed: 11/10/2021 12:16:10 PM By: Donnamarie Poag Entered By: Donnamarie Poag on 11/10/2021 12:05:41 Caitlin Jenkins ([id number]) -------------------------------------------------------------------------------- Lower Extremity Assessment Details Patient Name: Caitlin Jenkins Date of Service: 11/10/2021 11:30 AM Medical Record Number: [id number] Patient Account Number: [id number] Date of Birth/Sex: Dec 23, 1931 (88 y.o. F) Treating RN: Donnamarie Poag Primary Care Thai Hemrick: Delsa Grana Other Clinician: Referring Juvenal Umar: Delsa Grana Treating Meeka Cartelli/Extender: Yaakov Guthrie in Treatment: 7 Edema Assessment Assessed: [Left: No] [Right: Yes] Edema: [Left: N] [Right: o] Vascular Assessment Pulses: Dorsalis Pedis Palpable: [Right:Yes] Electronic Signature(s) Signed: 11/10/2021 12:16:10 PM By: Donnamarie Poag Entered By: Donnamarie Poag on 11/10/2021 11:39:23 Caitlin Jenkins ([id number]) -------------------------------------------------------------------------------- Multi Wound Chart Details Patient Name: Caitlin Jenkins Date of Service: 11/10/2021 11:30 AM Medical Record Number: [id number] Patient Account Number: [id number] Date of Birth/Sex: 01-22-1932 (88 y.o. F) Treating RN: Donnamarie Poag Primary Care Rikki Smestad: Delsa Grana Other Clinician: Referring Janaysha Depaulo: Delsa Grana Treating Cynthea Zachman/Extender: Yaakov Guthrie in Treatment: 7 Vital Signs Height(in): 41 Pulse(bpm): 23 Weight(lbs): 150 Blood Pressure(mmHg): 177/89 Body Mass Index(BMI): 25.7 Temperature(F): 98.0 Respiratory Rate(breaths/min): 16 Photos: [N/A:N/A] Wound Location: Right, Anterior Lower Leg N/A N/A Wounding Event: Laceration N/A N/A Primary Etiology: Venous Leg Ulcer N/A N/A Secondary Etiology: Trauma, Other N/A  N/A Comorbid History: Coronary Artery Disease, N/A N/A Hypertension, Cirrhosis , Gout Date Acquired: 08/27/2021 N/A N/A Weeks of Treatment: 7 N/A N/A Wound Status: Healed - Epithelialized N/A N/A Wound Recurrence: No N/A N/A Measurements L x W x D (cm) 0x0x0 N/A N/A Area (cm) : 0 N/A N/A Volume (cm) : 0 N/A N/A % Reduction in Area: 100.00% N/A N/A % Reduction in Volume: 100.00% N/A N/A Classification: Full Thickness Without Exposed N/A N/A Support Structures Exudate Amount: None Present N/A N/A Granulation Amount: None Present (0%) N/A N/A Necrotic Amount: None Present (0%) N/A N/A Exposed Structures:  Fascia: No N/A N/A Fat Layer (Subcutaneous Tissue): No Tendon: No Muscle: No Joint: No Bone: No Epithelialization: None N/A N/A Treatment Notes Electronic Signature(s) Signed: 11/10/2021 12:16:10 PM By: Donnamarie Poag Entered By: Donnamarie Poag on 11/10/2021 11:44:49 Caitlin Jenkins ([id number]) -------------------------------------------------------------------------------- [place] Details Patient Name: Caitlin Jenkins Date of Service: 11/10/2021 11:30 AM Medical Record Number: [id number] Patient Account Number: [id number] Date of Birth/Sex: 1932/09/26 (88 y.o. F) Treating RN: Donnamarie Poag Primary Care Emrah Ariola: Delsa Grana Other Clinician: Referring Chelsae Zanella: Delsa Grana Treating Norman Piacentini/Extender: Yaakov Guthrie in Treatment: 7 Active Inactive Electronic Signature(s) Signed: 11/10/2021 12:16:10 PM By: Donnamarie Poag Entered By: Donnamarie Poag on 11/10/2021 11:39:34 Caitlin Jenkins ([id number]) -------------------------------------------------------------------------------- Pain Assessment Details Patient Name: Caitlin Jenkins Date of Service: 11/10/2021 11:30 AM Medical Record Number: [id number] Patient Account Number: [id number] Date of Birth/Sex: 08-Dec-1931 (88 y.o. F) Treating RN: Donnamarie Poag Primary Care Shakela Donati: Delsa Grana Other  Clinician: Referring Myelle Poteat: Delsa Grana Treating Kamarie Veno/Extender: Yaakov Guthrie in Treatment: 7 Active Problems Location of Pain Severity and Description of Pain Patient Has Paino No Site Locations Rate the pain. Current Pain Level: 0 Pain Management and Medication Current Pain Management: Electronic Signature(s) Signed: 11/10/2021 12:16:10 PM By: Donnamarie Poag Entered By: Donnamarie Poag on 11/10/2021 11:37:57 Caitlin Jenkins ([id number]) -------------------------------------------------------------------------------- Patient/Caregiver Education Details Patient Name: Caitlin Jenkins Date of Service: 11/10/2021 11:30 AM Medical Record Number: [id number] Patient Account Number: [id number] Date of Birth/Gender: July 02, 1932 (88 y.o. F) Treating RN: Donnamarie Poag Primary Care Physician: Delsa Grana Other Clinician: Referring Physician: Delsa Grana Treating Physician/Extender: Yaakov Guthrie in Treatment: 7 Education Assessment Education Provided To: Patient Education Topics Provided Basic Hygiene: Electronic Signature(s) Signed: 11/10/2021 12:16:10 PM By: Donnamarie Poag Entered By: Donnamarie Poag on 11/10/2021 12:03:20 Caitlin Jenkins ([id number]) -------------------------------------------------------------------------------- Wound Assessment Details Patient Name: Caitlin Jenkins Date of Service: 11/10/2021 11:30 AM Medical Record Number: [id number] Patient Account Number: [id number] Date of Birth/Sex: September 06, 1932 (88 y.o. F) Treating RN: Donnamarie Poag Primary Care Erol Flanagin: Delsa Grana Other Clinician: Referring Maxi Carreras: Delsa Grana Treating Devaeh Amadi/Extender: Yaakov Guthrie in Treatment: 7 Wound Status Wound Number: 1 Primary Etiology: Venous Leg Ulcer Wound Location: Right, Anterior Lower Leg Secondary Trauma, Other Etiology: Wounding Event: Laceration Wound Status: Healed - Epithelialized Date Acquired: 08/27/2021 Comorbid History:  Coronary Artery Disease, Hypertension, Cirrhosis , Weeks Of Treatment: 7 Gout Clustered Wound: No Photos Wound Measurements Length: (cm) 0 Width: (cm) 0 Depth: (cm) 0 Area: (cm) Volume: (cm) % Reduction in Area: 100% % Reduction in Volume: 100% Epithelialization: None 0 0 Wound Description Classification: Full Thickness Without Exposed Support Structures Exudate Amount: None Present Foul Odor After Cleansing: No Slough/Fibrino No Wound Bed Granulation Amount: None Present (0%) Exposed Structure Necrotic Amount: None Present (0%) Fascia Exposed: No Fat Layer (Subcutaneous Tissue) Exposed: No Tendon Exposed: No Muscle Exposed: No Joint Exposed: No Bone Exposed: No Electronic Signature(s) Signed: 11/10/2021 12:16:10 PM By: Donnamarie Poag Entered ByDonnamarie Poag on 11/10/2021 11:39:00 Caitlin Jenkins ([id number]) -------------------------------------------------------------------------------- [place] Details Patient Name: Caitlin Jenkins Date of Service: 11/10/2021 11:30 AM Medical Record Number: [id number] Patient Account Number: [id number] Date of Birth/Sex: Jul 14, 1932 (88 y.o. F) Treating RN: Donnamarie Poag Primary Care Gaspar Fowle: Delsa Grana Other Clinician: Referring Jamelah Sitzer: Delsa Grana Treating Hansen Carino/Extender: Yaakov Guthrie in Treatment: 7 Vital Signs Time Taken: 11:35 Temperature (F): 98.0 Height (in): 64 Pulse (bpm): 83 Weight (lbs): 150 Respiratory Rate (breaths/min): 16 Body Mass Index (BMI): 25.7 Blood Pressure (mmHg): 177/89 Reference Range: 80 - 120  mg / dl Electronic Signature(s) Signed: 11/10/2021 12:16:10 PM By: Donnamarie Poag Entered ByDonnamarie Poag on 11/10/2021 11:36:27

## 2021-11-10 NOTE — Telephone Encounter (Signed)
Copied from [place] [phone]. Topic: General - Other >> Nov 10, 2021 12:54 PM McGill, [name] wrote: Reason for CRM: [name] from [facility], DDS PA stated pt is having a root canal procedure on March 8th, and pt is requesting medication to calm her down .  Velna Hatchet is requesting medical clearance to send medication for pt.  Fax - [phone]

## 2021-11-11 NOTE — Telephone Encounter (Signed)
Return [place] call, Velna Hatchet was not in the office spoke to [place]. She stated if I could call on Monday due to [facility] being there.

## 2021-11-15 ENCOUNTER — Other Ambulatory Visit: Payer: Self-pay

## 2021-11-15 NOTE — Telephone Encounter (Signed)
Spoke to [place] she states 5mg  Valium. instructions would be for pt to take 1 pill at night before the the appointment and 1 pill an hr prior to appointment. Pt states to her that one of her daughters will be assisting with transportation the day of her appointment.

## 2021-11-15 NOTE — Telephone Encounter (Signed)
Medical clearance form has been faxed over to [facility] with [place].

## 2021-11-25 IMAGING — MR MR MRA NECK WO/W CM
2 of 3 series · 28 of 48 positions shown · IV contrast (6ml Gadavist)
Comparison: Head CT performed earlier today 03/30/2021. Brain MRI
12/26/2012.

CLINICAL DATA: Neuro deficit, acute, stroke suspected. Additional
history provided: Headache. Evaluate for intracranial hemorrhage.
Fall 2 weeks ago with history of subdural hematoma.

EXAM:
MRI HEAD WITHOUT CONTRAST
MRA HEAD WITHOUT CONTRAST
MRA NECK WITHOUT AND WITH CONTRAST
TECHNIQUE: Multiplanar, multi-echo pulse sequences of the brain and surrounding
structures were acquired without intravenous contrast. Angiographic
images of the Circle of Willis were acquired using MRA technique
without intravenous contrast. Angiographic images of the neck were
acquired using MRA technique without and with intravenous contrast.
Carotid stenosis measurements (when applicable) are obtained
utilizing NASCET criteria, using the distal internal carotid
diameter as the denominator.
CONTRAST:  6mL GADAVIST GADOBUTROL 1 MMOL/ML IV SOLN

[Series 9: angio_fl3d_cor_pre_ttc=2.0s · coronal · 0.9mm · 0.85mm/px · 14 of 96 slices shown]
[im 1/96]
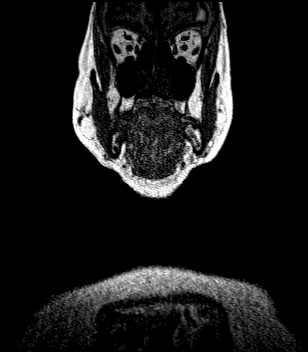
[im 8/96]
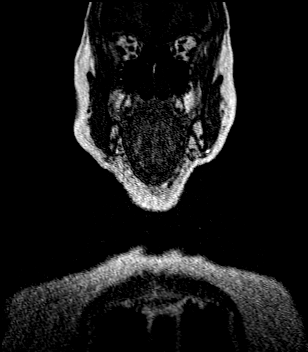
[im 15/96]
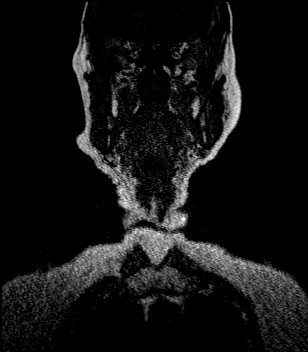
[im 22/96]
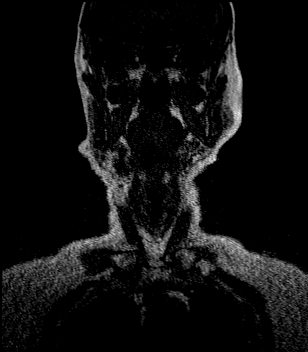
[im 30/96]
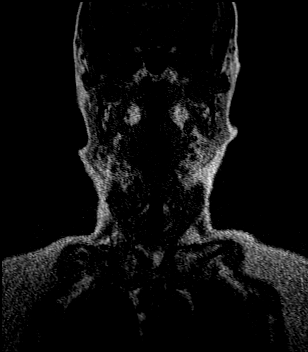
[im 37/96]
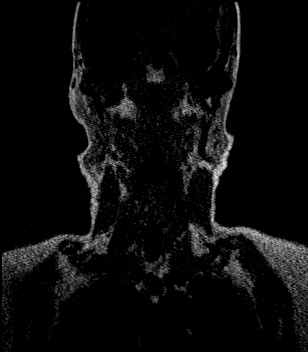
[im 44/96]
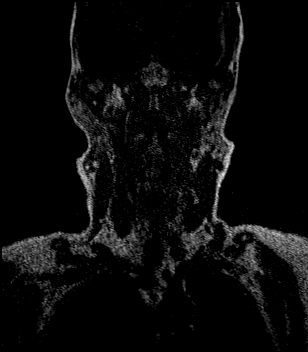
[im 52/96]
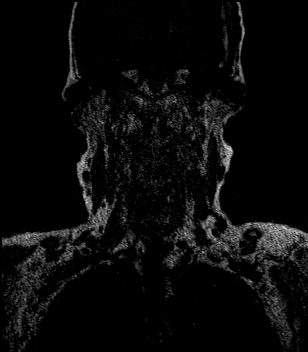
[im 59/96]
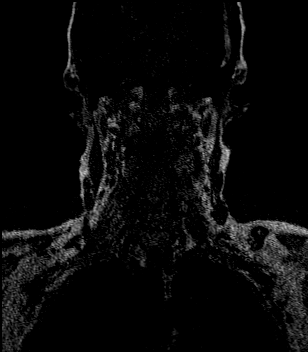
[im 66/96]
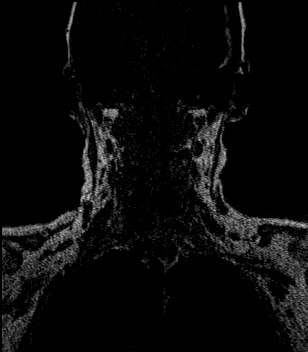
[im 74/96]
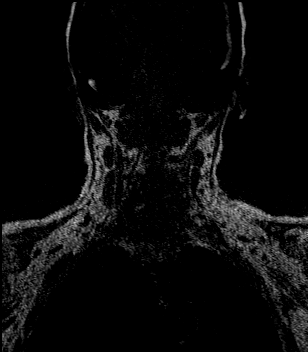
[im 81/96]
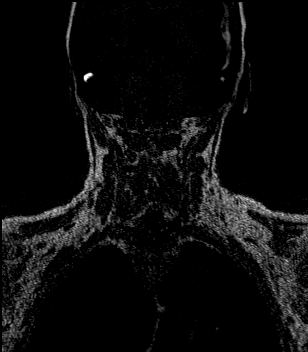
[im 88/96]
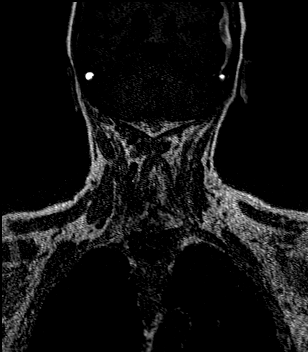
[im 96/96]
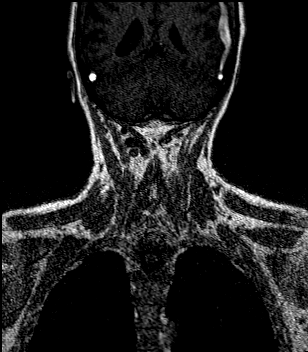

[Series 11: angio_fl3d_cor_post_ttc=2.0s · coronal · 0.9mm · 0.85mm/px · 14 of 94 slices shown]
[im 1/94]
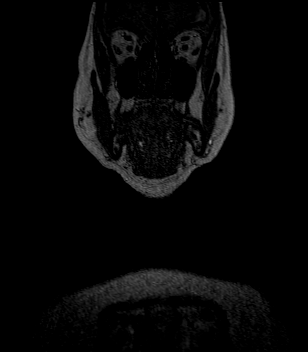
[im 8/94]
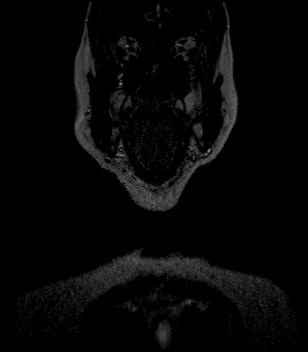
[im 15/94]
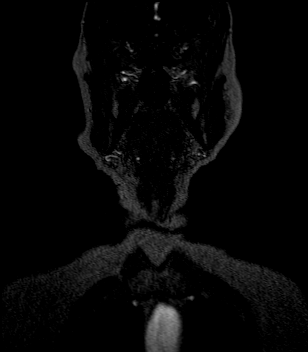
[im 22/94]
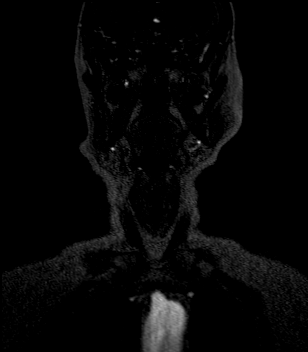
[im 29/94]
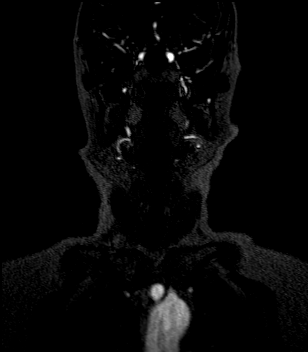
[im 36/94]
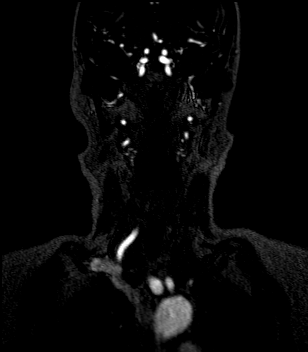
[im 43/94]
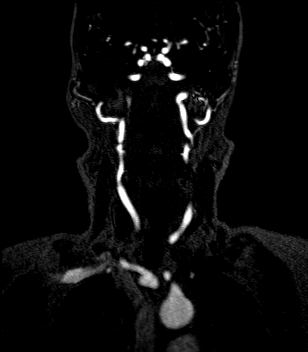
[im 51/94]
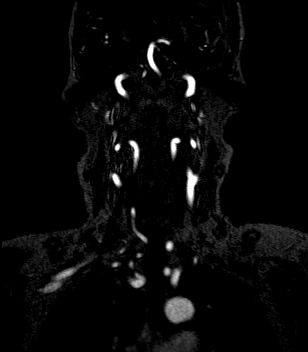
[im 58/94]
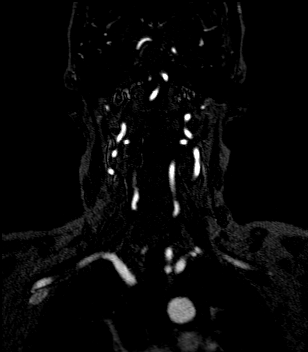
[im 65/94]
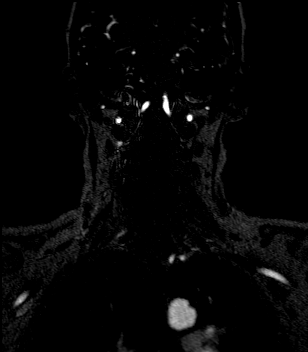
[im 72/94]
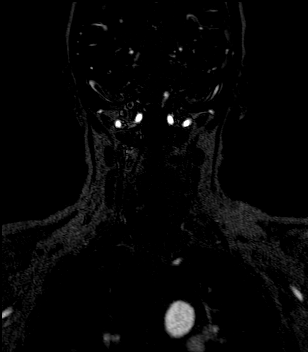
[im 79/94]
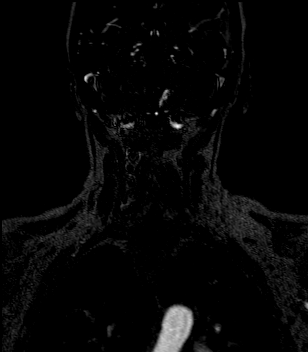
[im 86/94]
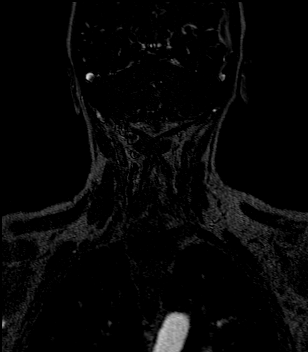
[im 94/94]
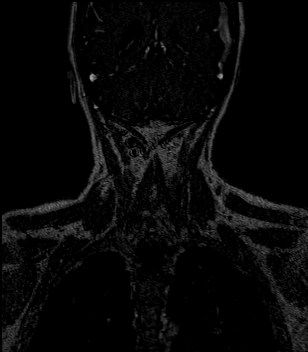

[28 of 48 positions shown; findings below may reference images not displayed]

FINDINGS: MRI HEAD FINDINGS

Brain:

Mild generalized cerebral and cerebellar atrophy.

There is a subdural hematoma overlying the left cerebral hemisphere
measuring up to 10 mm. The subdural hematoma is characterized by
precontrast T1 hyperintensity, T2 FLAIR hyperintensity and
heterogeneous SWI signal loss. This is most compatible with a
subacute subdural hemorrhage. The collection also demonstrates
apparent restricted diffusion (which may reflect susceptibility
artifact from blood products at this site). This collection does not
appear significantly changed in size as compared to the head CT
performed earlier today. As before, there is mild mass effect upon
the underlying left cerebral hemisphere without midline shift.

There is trace subacute subarachnoid hemorrhage overlying the left
cerebral hemisphere (for instance as seen on series 15, image 31).

Trace subacute subdural hemorrhage is also present along the falx
and overlying the posterior right frontal lobe (for instance as seen
on series 15, image 48) (series 9, image 12).

Mild multifocal T2/FLAIR hyperintensity within the cerebral white
matter and pons, nonspecific but compatible with chronic small
vessel ischemic disease.

There is no acute infarct.

No evidence of an intracranial mass.

Vascular: Expected proximal arterial flow voids.

Skull and upper cervical spine: No focal marrow lesion.

Sinuses/Orbits: Visualized orbits show no acute finding. Bilateral
lens replacements. Trace scattered paranasal sinus mucosal
thickening.

MRA HEAD FINDINGS

Anterior circulation:

The intracranial internal carotid arteries are patent. The M1 middle
cerebral arteries are patent. Mild/moderate stenosis within the
proximal M1 segment of the right middle cerebral artery.
Atherosclerotic irregularity of the M2 and more distal middle
cerebral artery branches bilaterally. No M2 proximal branch
occlusion or high-grade proximal stenosis is identified. The
anterior cerebral arteries are patent. Motion artifact limits
evaluation of the distal A2 and proximal A3 segments of the anterior
cerebral arteries.

There appear to be 2 anterior communicating arteries. There is a 2
mm aneurysm arising from the more anterior of the anterior
communicating arteries (for instance as seen on series 5, image
107). 2 mm ventrally projecting vascular protrusion arising from the
cavernous right ICA suspicious for additional aneurysm (series 5,
image 77) (series 2020, image 236). 2 mm inferiorly projecting
vascular protrusion arising from the supraclinoid left ICA, with an
appearance most suggestive of a small aneurysm (series 2020, image
194) (series 105, image 86). 1-2 mm inferiorly projecting vascular
protrusion arising from the supraclinoid right ICA, which may
reflect an infundibulum or small aneurysm.

Posterior circulation:

The intracranial vertebral arteries are patent. The basilar artery
is patent. The posterior cerebral arteries are patent. Posterior
communicating arteries are hypoplastic or absent bilaterally.

Anatomic variants: As described

MRA NECK FINDINGS

Aortic arch: Standard aortic branching. The visualized aortic arch
is normal in caliber. No hemodynamically significant innominate or
proximal subclavian artery stenosis.

Right carotid system: The common carotid and internal carotid
arteries are patent within the neck without hemodynamically
significant stenosis (50% or greater).

Left carotid system: The common carotid and internal carotid
arteries are patent within the neck without hemodynamically
significant stenosis (50% or greater).

Vertebral arteries: Vertebral arteries are codominant and patent
within the neck with antegrade flow. No hemodynamically significant
stenosis within these vessels.

MRI brain findings called by telephone at the time of interpretation
on 03/30/2021 at [DATE] to provider Dr. Madonna, who verbally
acknowledged these results.
IMPRESSION: MRI brain:

1. Redemonstrated subacute subdural hematoma overlying left cerebral
hemisphere, measuring up to 10 mm in thickness. There is apparent
restricted diffusion within the collection, which may reflect
susceptibility artifact from blood products at this site. However,
correlate clinically to exclude any signs or symptoms that would
suggest superimposed infection/empyema.
2. Trace subacute subdural hemorrhage also present along the falx
and overlying the posterior right frontal lobe.
3. Trace scattered subacute subarachnoid hemorrhage overlying the
left cerebral hemisphere.
4. Mild chronic small vessel ischemic changes within the cerebral
white matter and pons.
5. Mild generalized parenchymal atrophy.

MRA head:

1. Intracranial atherosclerotic disease, as described. Motion
artifact limits evaluation of the anterior cerebral arteries at the
A2/A3 junction. Within this limitation, no intracranial large vessel
occlusion or proximal high-grade arterial stenosis is identified.
2. There appear to be 2 anterior communicating arteries. There is a
2 mm aneurysm arising from the more anterior of the anterior
communicating arteries.
3. 2 mm aneurysm arising from the cavernous right ICA.
4. 2 mm aneurysm arising from the supraclinoid left ICA.
5. 1-2 mm inferiorly projecting vascular protrusion arising from the
supraclinoid right ICA, which may reflect an infundibulum or small
aneurysm.

MRA neck:

The common carotid, internal carotid and vertebral arteries are
patent within the neck without hemodynamically significant stenosis.

## 2021-12-27 ENCOUNTER — Telehealth: Payer: Self-pay

## 2021-12-27 DIAGNOSIS — I10 Essential (primary) hypertension: Secondary | ICD-10-CM

## 2021-12-27 NOTE — Telephone Encounter (Signed)
Pt states is only taking 2.5 of Norvasc.  Does not ever remember taking '5mg'$ .  Wants changed to 2.'5mg'$ ? ?

## 2021-12-27 NOTE — Telephone Encounter (Signed)
They have conflicting information and want to make sure it is correct.  ?

## 2021-12-27 NOTE — Telephone Encounter (Signed)
Erroneous encounter

## 2021-12-27 NOTE — Telephone Encounter (Signed)
Pharmacy called and stated that they need a new RX with correct dosage for Amlodipine. Please advise.  ?

## 2021-12-28 ENCOUNTER — Telehealth: Payer: Self-pay | Admitting: Internal Medicine

## 2021-12-28 MED ORDER — AMLODIPINE BESYLATE 2.5 MG PO TABS: 2.5000 mg | ORAL_TABLET | Freq: Every day | ORAL | 1 refills | Status: DC

## 2021-12-28 NOTE — Telephone Encounter (Signed)
Caitlin Jenkins called to verify the correct dose for amLODipine (NORVASC) 5 MG tablet/ stated they have 2 on the profile and need another Rx sent stating this is the "correct current dose"/ please advise  ?

## 2021-12-28 NOTE — Telephone Encounter (Signed)
Medication was refilled 12/28/21, by provider. ?

## 2021-12-28 NOTE — Telephone Encounter (Signed)
Reviewed chart. Was seen 2/7 for HTN with recommendations to continue 2.'5mg'$  dose. New rx sent. ?

## 2021-12-28 NOTE — Addendum Note (Signed)
Addended by: Myles Gip on: 12/28/2021 10:00 AM ? ? Modules accepted: Orders ? ?

## 2022-01-27 DIAGNOSIS — I129 Hypertensive chronic kidney disease with stage 1 through stage 4 chronic kidney disease, or unspecified chronic kidney disease: Secondary | ICD-10-CM | POA: Diagnosis not present

## 2022-01-27 DIAGNOSIS — N1832 Chronic kidney disease, stage 3b: Secondary | ICD-10-CM | POA: Diagnosis not present

## 2022-01-27 DIAGNOSIS — N2581 Secondary hyperparathyroidism of renal origin: Secondary | ICD-10-CM | POA: Diagnosis not present

## 2022-01-27 DIAGNOSIS — E871 Hypo-osmolality and hyponatremia: Secondary | ICD-10-CM | POA: Diagnosis not present

## 2022-01-31 DIAGNOSIS — F0390 Unspecified dementia without behavioral disturbance: Secondary | ICD-10-CM | POA: Diagnosis not present

## 2022-01-31 DIAGNOSIS — M1990 Unspecified osteoarthritis, unspecified site: Secondary | ICD-10-CM | POA: Diagnosis not present

## 2022-01-31 DIAGNOSIS — I10 Essential (primary) hypertension: Secondary | ICD-10-CM | POA: Diagnosis not present

## 2022-02-03 DIAGNOSIS — N1831 Chronic kidney disease, stage 3a: Secondary | ICD-10-CM | POA: Diagnosis not present

## 2022-02-03 DIAGNOSIS — R809 Proteinuria, unspecified: Secondary | ICD-10-CM | POA: Diagnosis not present

## 2022-02-03 DIAGNOSIS — R296 Repeated falls: Secondary | ICD-10-CM | POA: Diagnosis not present

## 2022-02-16 ENCOUNTER — Telehealth: Payer: Self-pay | Admitting: Gastroenterology

## 2022-02-16 MED ORDER — URSODIOL 300 MG PO CAPS: 300.0000 mg | ORAL_CAPSULE | Freq: Every day | ORAL | 0 refills | Status: DC

## 2022-02-16 NOTE — Telephone Encounter (Signed)
Patient states she needs a refill on her prescription (ursodiol 300 MG). Patient states she only has enough left for one week and it comes in through the mail.

## 2022-02-16 NOTE — Addendum Note (Signed)
Addended by: Lurlean Nanny on: 02/16/2022 10:52 AM   Modules accepted: Orders

## 2022-02-16 NOTE — Telephone Encounter (Signed)
Rx sent through [facility]  

## 2022-02-16 NOTE — Addendum Note (Signed)
Addended by: Lurlean Nanny on: 02/16/2022 01:40 PM   Modules accepted: Orders

## 2022-02-23 ENCOUNTER — Telehealth: Payer: Self-pay | Admitting: Internal Medicine

## 2022-02-23 NOTE — Telephone Encounter (Signed)
Hope w/ [facility] calling to ask if OK to change brand name of levothyroxine (SYNTHROID) 75 MCG tablet  To LUPIN  Last time it was something else. [id number]  Ref [id number]

## 2022-02-24 NOTE — Telephone Encounter (Signed)
Elana with [facility] has called re process of this approval.  [phone] [id number] ref

## 2022-02-25 NOTE — Telephone Encounter (Signed)
Left vm with [facility] for thyroid change

## 2022-03-03 ENCOUNTER — Ambulatory Visit (INDEPENDENT_AMBULATORY_CARE_PROVIDER_SITE_OTHER): Payer: PPO

## 2022-03-03 VITALS — BP 138/82 | HR 73 | Temp 97.8°F | Resp 15 | Ht 64.0 in | Wt 152.3 lb

## 2022-03-03 DIAGNOSIS — Z0000 Encounter for general adult medical examination without abnormal findings: Secondary | ICD-10-CM | POA: Diagnosis not present

## 2022-03-03 NOTE — Patient Instructions (Signed)
Caitlin Jenkins , Thank you for taking time to come for your Medicare Wellness Visit. I appreciate your ongoing commitment to your health goals. Please review the following plan we discussed and let me know if I can assist you in the future.   Screening recommendations/referrals: Colonoscopy: no longer required Mammogram: no longer required Bone Density: no longer required Recommended yearly ophthalmology/optometry visit for glaucoma screening and checkup Recommended yearly dental visit for hygiene and checkup  Vaccinations: Influenza vaccine: due fall 2023 Pneumococcal vaccine: done 05/16/14 Tdap vaccine: done 08/27/21 Shingles vaccine: done 07/24/18 & 11/07/18   Covid-19:declined  Conditions/risks identified: recommend drinking 6-8 glasses of water per day   Next appointment: Follow up in one year for your annual wellness visit    Preventive Care 30 Years and Older, Female Preventive care refers to lifestyle choices and visits with your health care provider that can promote health and wellness. What does preventive care include? A yearly physical exam. This is also called an annual well check. Dental exams once or twice a year. Routine eye exams. Ask your health care provider how often you should have your eyes checked. Personal lifestyle choices, including: Daily care of your teeth and gums. Regular physical activity. Eating a healthy diet. Avoiding tobacco and drug use. Limiting alcohol use. Practicing safe sex. Taking low-dose aspirin every day. Taking vitamin and mineral supplements as recommended by your health care provider. What happens during an annual well check? The services and screenings done by your health care provider during your annual well check will depend on your age, overall health, lifestyle risk factors, and family history of disease. Counseling  Your health care provider may ask you questions about your: Alcohol use. Tobacco use. Drug use. Emotional  well-being. Home and relationship well-being. Sexual activity. Eating habits. History of falls. Memory and ability to understand (cognition). Work and work Statistician. Reproductive health. Screening  You may have the following tests or measurements: Height, weight, and BMI. Blood pressure. Lipid and cholesterol levels. These may be checked every 5 years, or more frequently if you are over 56 years old. Skin check. Lung cancer screening. You may have this screening every year starting at age 69 if you have a 30-pack-year history of smoking and currently smoke or have quit within the past 15 years. Fecal occult blood test (FOBT) of the stool. You may have this test every year starting at age 54. Flexible sigmoidoscopy or colonoscopy. You may have a sigmoidoscopy every 5 years or a colonoscopy every 10 years starting at age 70. Hepatitis C blood test. Hepatitis B blood test. Sexually transmitted disease (STD) testing. Diabetes screening. This is done by checking your blood sugar (glucose) after you have not eaten for a while (fasting). You may have this done every 1-3 years. Bone density scan. This is done to screen for osteoporosis. You may have this done starting at age 56. Mammogram. This may be done every 1-2 years. Talk to your health care provider about how often you should have regular mammograms. Talk with your health care provider about your test results, treatment options, and if necessary, the need for more tests. Vaccines  Your health care provider may recommend certain vaccines, such as: Influenza vaccine. This is recommended every year. Tetanus, diphtheria, and acellular pertussis (Tdap, Td) vaccine. You may need a Td booster every 10 years. Zoster vaccine. You may need this after age 61. Pneumococcal 13-valent conjugate (PCV13) vaccine. One dose is recommended after age 35. Pneumococcal polysaccharide (PPSV23) vaccine. One dose  is recommended after age 15. Talk to your  health care provider about which screenings and vaccines you need and how often you need them. This information is not intended to replace advice given to you by your health care provider. Make sure you discuss any questions you have with your health care provider. Document Released: 10/09/2015 Document Revised: 06/01/2016 Document Reviewed: 07/14/2015 Elsevier Interactive Patient Education  2017 [place] Prevention in the Home Falls can cause injuries. They can happen to people of all ages. There are many things you can do to make your home safe and to help prevent falls. What can I do on the outside of my home? Regularly fix the edges of walkways and driveways and fix any cracks. Remove anything that might make you trip as you walk through a door, such as a raised step or threshold. Trim any bushes or trees on the path to your home. Use bright outdoor lighting. Clear any walking paths of anything that might make someone trip, such as rocks or tools. Regularly check to see if handrails are loose or broken. Make sure that both sides of any steps have handrails. Any raised decks and porches should have guardrails on the edges. Have any leaves, snow, or ice cleared regularly. Use sand or salt on walking paths during winter. Clean up any spills in your garage right away. This includes oil or grease spills. What can I do in the bathroom? Use night lights. Install grab bars by the toilet and in the tub and shower. Do not use towel bars as grab bars. Use non-skid mats or decals in the tub or shower. If you need to sit down in the shower, use a plastic, non-slip stool. Keep the floor dry. Clean up any water that spills on the floor as soon as it happens. Remove soap buildup in the tub or shower regularly. Attach bath mats securely with double-sided non-slip rug tape. Do not have throw rugs and other things on the floor that can make you trip. What can I do in the bedroom? Use night  lights. Make sure that you have a light by your bed that is easy to reach. Do not use any sheets or blankets that are too big for your bed. They should not hang down onto the floor. Have a firm chair that has side arms. You can use this for support while you get dressed. Do not have throw rugs and other things on the floor that can make you trip. What can I do in the kitchen? Clean up any spills right away. Avoid walking on wet floors. Keep items that you use a lot in easy-to-reach places. If you need to reach something above you, use a strong step stool that has a grab bar. Keep electrical cords out of the way. Do not use floor polish or wax that makes floors slippery. If you must use wax, use non-skid floor wax. Do not have throw rugs and other things on the floor that can make you trip. What can I do with my stairs? Do not leave any items on the stairs. Make sure that there are handrails on both sides of the stairs and use them. Fix handrails that are broken or loose. Make sure that handrails are as long as the stairways. Check any carpeting to make sure that it is firmly attached to the stairs. Fix any carpet that is loose or worn. Avoid having throw rugs at the top or bottom of the stairs.  If you do have throw rugs, attach them to the floor with carpet tape. Make sure that you have a light switch at the top of the stairs and the bottom of the stairs. If you do not have them, ask someone to add them for you. What else can I do to help prevent falls? Wear shoes that: Do not have high heels. Have rubber bottoms. Are comfortable and fit you well. Are closed at the toe. Do not wear sandals. If you use a stepladder: Make sure that it is fully opened. Do not climb a closed stepladder. Make sure that both sides of the stepladder are locked into place. Ask someone to hold it for you, if possible. Clearly mark and make sure that you can see: Any grab bars or handrails. First and last  steps. Where the edge of each step is. Use tools that help you move around (mobility aids) if they are needed. These include: Canes. Walkers. Scooters. Crutches. Turn on the lights when you go into a dark area. Replace any light bulbs as soon as they burn out. Set up your furniture so you have a clear path. Avoid moving your furniture around. If any of your floors are uneven, fix them. If there are any pets around you, be aware of where they are. Review your medicines with your doctor. Some medicines can make you feel dizzy. This can increase your chance of falling. Ask your doctor what other things that you can do to help prevent falls. This information is not intended to replace advice given to you by your health care provider. Make sure you discuss any questions you have with your health care provider. Document Released: 07/09/2009 Document Revised: 02/18/2016 Document Reviewed: 10/17/2014 Elsevier Interactive Patient Education  2017 [employer].

## 2022-03-03 NOTE — Progress Notes (Signed)
Subjective:   Caitlin Jenkins is a 88 y.o. female who presents for Medicare Annual (Subsequent) preventive examination.  Review of Systems     Cardiac Risk Factors include: advanced age (>71mn, >>64women);dyslipidemia;hypertension     Objective:    Today's Vitals   03/03/22 1343  BP: 138/82  Pulse: 73  Resp: 15  Temp: 97.8 F (36.6 C)  TempSrc: Oral  SpO2: 99%  Weight: 152 lb 4.8 oz (69.1 kg)  Height: '5\' 4"'$  (1.626 m)   Body mass index is 26.14 kg/m.     03/03/2022    1:57 PM 09/08/2021   11:53 AM 08/27/2021    5:52 PM 03/30/2021    1:01 PM 03/02/2021    1:46 PM 02/27/2020    2:57 PM 03/05/2019    2:29 PM  Advanced Directives  Does Patient Have a Medical Advance Directive? Yes No No Yes Yes Yes Yes  Type of AParamed[place]of [place]Living will   [place] will [place]Living will [place]Living will [place]Living will  Does patient want to make changes to medical advance directive?    No - Patient declined     Copy of [place] Chart? Yes - validated most recent copy scanned in chart (See row information)   No - copy requested Yes - validated most recent copy scanned in chart (See row information) Yes - validated most recent copy scanned in chart (See row information) Yes - validated most recent copy scanned in chart (See row information)    Current Medications (verified) Outpatient Encounter Medications as of 03/03/2022  Medication Sig   acetaminophen (TYLENOL) 500 MG tablet Take 500 mg by mouth every 6 (six) hours as needed.   amLODipine (NORVASC) 2.5 MG tablet Take 1 tablet (2.5 mg total) by mouth daily.   Cholecalciferol (VITAMIN D-3) 25 MCG (1000 UT) CAPS Take 1 capsule by mouth daily.   COLACE 100 MG capsule Take 100 mg by mouth daily.    Colchicine 0.6 MG CAPS Take 0.6 mg by mouth daily as needed.   levothyroxine (SYNTHROID) 75 MCG  tablet Take 1 tablet (75 mcg total) by mouth daily.   losartan (COZAAR) 100 MG tablet Take 1 tablet by mouth daily (Avoid salt substitutes, no fruit smoothies.)   Turmeric 450 MG CAPS Take 2 capsules by mouth daily.   ursodiol (ACTIGALL) 300 MG capsule Take 1 capsule (300 mg total) by mouth daily.   Zinc 50 MG CAPS Take by mouth.   [DISCONTINUED] doxycycline (VIBRAMYCIN) 100 MG capsule Take 1 capsule (100 mg total) by mouth 2 (two) times daily.   [DISCONTINUED] meclizine (ANTIVERT) 25 MG tablet Take 0.5-1 tablets (12.5-25 mg total) by mouth 3 (three) times daily as needed for dizziness.   [DISCONTINUED] MULTIPLE VITAMINS-CALCIUM PO Take by mouth.   [DISCONTINUED] pantoprazole (PROTONIX) 40 MG tablet Take 1 tablet (40 mg total) by mouth at bedtime.   [DISCONTINUED] Probiotic Product (PROBIOTIC DAILY PO) Take by mouth.   No facility-administered encounter medications on file as of 03/03/2022.    Allergies (verified) Indomethacin, Levofloxacin in d5w, Ace inhibitors, Augmentin [amoxicillin-pot clavulanate], Elemental sulfur, Penicillins, and Sulfa antibiotics   History: Past Medical History:  Diagnosis Date   Biliary cirrhosis (HCC)    Cancer of skin of leg    Cellulitis    CKD (chronic kidney disease) stage 3, GFR 30-59 ml/min (HCC)    Gout    Hiatal hernia Oct  2015   8.5cm   Hiatal hernia 06/26/2014   8.5cm    History of basal cell carcinoma (BCC) 09/12/2018   Oct 2016; Moh's surgery; right antitragus   History of diverticulitis 2000   Hyperlipidemia    Hyperparathyroidism, secondary renal ([place]) 07/11/2018   Managed by nephrologist   Hypertension    Hypomagnesemia 07/03/2019   Hypothyroidism    Skin cancer of nose    Past Surgical History:  Procedure Laterality Date   ABDOMINAL HYSTERECTOMY  1995   CATARACT EXTRACTION     FOOT SURGERY  2000   HEMORRHOID SURGERY     THYROID SURGERY  1999   para thyroid   Family History  Problem Relation Age of Onset   Emphysema Father     Asthma Father    Hypertension Daughter    Heart disease Daughter        3 stents   Diabetes Daughter    Hypertension Son    Cancer Son        prostate   Diabetes Son    Heart disease Daughter        heart attack, 2 stents   Multiple sclerosis Daughter    Diabetes Daughter    Fibromyalgia Daughter    Diabetes Daughter    Social History   Socioeconomic History   Marital status: Widowed    Spouse name: Joe   Number of children: 4   Years of education: some college   Highest education level: 12th grade  Occupational History   Occupation: Retired  Tobacco Use   Smoking status: Never   Smokeless tobacco: Never   Tobacco comments:    smoking cessation materials not required  Vaping Use   Vaping Use: Never used  Substance and Sexual Activity   Alcohol use: No   Drug use: No   Sexual activity: Not Currently  Other Topics Concern   Not on file  Social History Narrative   Pt lives alone.    Active working in her yard with over 200 rose bushes.    Social Determinants of Health   Financial Resource Strain: Low Risk  (03/03/2022)   Overall Financial Resource Strain (CARDIA)    Difficulty of Paying Living Expenses: Not hard at all  Food Insecurity: No Food Insecurity (03/03/2022)   Hunger Vital Sign    Worried About Running Out of Food in the Last Year: Never true    Ran Out of Food in the Last Year: Never true  Transportation Needs: No Transportation Needs (03/03/2022)   PRAPARE - Hydrologist (Medical): No    Lack of Transportation (Non-Medical): No  Physical Activity: Inactive (03/03/2022)   Exercise Vital Sign    Days of Exercise per Week: 0 days    Minutes of Exercise per Session: 0 min  Stress: No Stress Concern Present (03/03/2022)   [place]    Feeling of Stress : Not at all  Social Connections: Moderately Isolated (03/03/2022)   Social Connection and Isolation Panel [NHANES]     Frequency of Communication with Friends and Family: More than three times a week    Frequency of Social Gatherings with Friends and Family: Three times a week    Attends Religious Services: More than 4 times per year    Active Member of Clubs or Organizations: No    Attends Archivist Meetings: Never    Marital Status: Widowed    Tobacco Counseling Counseling  given: Not Answered Tobacco comments: smoking cessation materials not required   Clinical Intake:  Pre-visit preparation completed: Yes  Pain : No/denies pain     Nutritional Risks: None Diabetes: No  How often do you need to have someone help you when you read instructions, pamphlets, or other written materials from your doctor or pharmacy?: 1 - Never   Interpreter Needed?: No  Information entered by :: Clemetine Marker LPN   Activities of Daily Living    03/03/2022    1:59 PM 11/02/2021    1:27 PM  In your present state of health, do you have any difficulty performing the following activities:  Hearing? 1 1  Vision? 0 0  Difficulty concentrating or making decisions? 0 0  Walking or climbing stairs? 0 0  Dressing or bathing? 0 0  Doing errands, shopping? 0 0  Preparing Food and eating ? N   Using the Toilet? N   In the past six months, have you accidently leaked urine? N   Do you have problems with loss of bowel control? N   Managing your Medications? N   Managing your Finances? N   Housekeeping or managing your Housekeeping? N     Patient Care Team: Teodora Medici, DO as PCP - General (Internal Medicine) Lavonia Dana, MD as Consulting Physician (Internal Medicine) Lucilla Lame, MD as Consulting Physician (Gastroenterology) Oneta Rack, MD (Dermatology)  Indicate any recent Medical Services you may have received from other than [facility] providers in the past year (date may be approximate).     Assessment:   This is a routine wellness examination for Ayani.  Hearing/Vision  screen Hearing Screening - Comments:: Patient wears hearing aids Vision Screening - Comments:: Annual vision screens with Dr Ellin Mayhew  Dietary issues and exercise activities discussed: Current Exercise Habits: The patient does not participate in regular exercise at present, Exercise limited by: None identified   Goals Addressed   None    Depression Screen    03/03/2022    1:55 PM 11/02/2021    1:27 PM 07/13/2021   10:17 AM 04/12/2021   11:16 AM 04/02/2021    1:45 PM 03/23/2021    8:29 AM 03/16/2021    9:55 AM  PHQ 2/9 Scores  PHQ - 2 Score 0 0 0 0 0 0 4  PHQ- 9 Score  0 0 0  0 12    Fall Risk    03/03/2022    1:58 PM 11/02/2021    1:27 PM 07/13/2021   10:16 AM 04/12/2021   11:16 AM 03/23/2021    8:29 AM  Fall Risk   Falls in the past year? 0 0 1 1 0  Number falls in past yr: 0 0 0 0 0  Injury with Fall? 0 0 1 1 0  Risk for fall due to : No Fall Risks No Fall Risks Impaired balance/gait    Follow up Falls prevention discussed Falls prevention discussed Falls prevention discussed      FALL RISK PREVENTION PERTAINING TO THE HOME:  Any stairs in or around the home? Yes  If so, are there any without handrails? No Home free of loose throw rugs in walkways, pet beds, electrical cords, etc? Yes  Adequate lighting in your home to reduce risk of falls? Yes   ASSISTIVE DEVICES UTILIZED TO PREVENT FALLS:  Life alert? Yes  Use of a cane, walker or w/c? No  Grab bars in the bathroom? Yes  Shower chair or bench in shower? Yes  Elevated toilet seat or a handicapped toilet? No   TIMED UP AND GO:  Was the test performed? Yes .  Length of time to ambulate 10 feet: 5 sec.   Gait steady and fast with assistive device  Cognitive Function:        02/27/2020    2:11 PM 03/05/2019    2:35 PM 03/02/2018   10:59 AM  6CIT Screen  What Year? 0 points 0 points 0 points  What month? 0 points 0 points 0 points  What time? 0 points 0 points 0 points  Count back from 20 0 points 0 points 0 points   Months in reverse 0 points 0 points 0 points  Repeat phrase 0 points 0 points 0 points  Total Score 0 points 0 points 0 points    Immunizations Immunization History  Administered Date(s) Administered   Fluad Quad(high Dose 65+) 06/05/2019   Influenza, High Dose Seasonal PF 05/24/2016, 08/02/2017, 06/13/2018   Influenza, Seasonal, Injecte, Preservative Fre 07/22/2013, 07/15/2014   Influenza,inj,Quad PF,6+ Mos 06/19/2015   Influenza-Unspecified 07/18/2012, 07/21/2014, 08/02/2017   Pneumococcal Conjugate-13 05/16/2014   Pneumococcal Polysaccharide-23 09/26/2013   Tdap 08/27/2021   Zoster Recombinat (Shingrix) 07/24/2018, 11/07/2018   Zoster, Live 09/26/2010    TDAP status: Up to date  Flu Vaccine status: Due, Education has been provided regarding the importance of this vaccine. Advised may receive this vaccine at local pharmacy or Health Dept. Aware to provide a copy of the vaccination record if obtained from local pharmacy or Health Dept. Verbalized acceptance and understanding.  Pneumococcal vaccine status: Up to date  Covid-19 vaccine status: Declined, Education has been provided regarding the importance of this vaccine but patient still declined. Advised may receive this vaccine at local pharmacy or Health Dept.or vaccine clinic. Aware to provide a copy of the vaccination record if obtained from local pharmacy or Health Dept. Verbalized acceptance and understanding.  Qualifies for Shingles Vaccine? Yes   Zostavax completed Yes   Shingrix Completed?: Yes  Screening Tests Health Maintenance  Topic Date Due   DEXA SCAN  10/03/2016   INFLUENZA VACCINE  04/26/2022   TETANUS/TDAP  08/28/2031   Pneumonia Vaccine 94+ Years old  Completed   Zoster Vaccines- Shingrix  Completed   HPV VACCINES  Aged Out   COVID-19 Vaccine  Discontinued    Health Maintenance  Health Maintenance Due  Topic Date Due   DEXA SCAN  10/03/2016    Colorectal cancer screening: No longer required.    Mammogram status: No longer required due to age.  Bone density status: no longer required  Lung Cancer Screening: (Low Dose CT Chest recommended if Age 21-80 years, 30 pack-year currently smoking OR have quit w/in 15years.) does not qualify.   Additional Screening:  Hepatitis C Screening: does not qualify  Vision Screening: Recommended annual ophthalmology exams for early detection of glaucoma and other disorders of the eye. Is the patient up to date with their annual eye exam?  Yes  Who is the provider or what is the name of the office in which the patient attends annual eye exams? Dr. Ellin Mayhew.   Dental Screening: Recommended annual dental exams for proper oral hygiene  Community Resource Referral / Chronic Care Management: CRR required this visit?  No   CCM required this visit?  No      Plan:     I have personally reviewed and noted the following in the patient's chart:   Medical and social history Use of alcohol, tobacco or  illicit drugs  Current medications and supplements including opioid prescriptions.  Functional ability and status Nutritional status Physical activity Advanced directives List of other physicians Hospitalizations, surgeries, and ER visits in previous 12 months Vitals Screenings to include cognitive, depression, and falls Referrals and appointments  In addition, I have reviewed and discussed with patient certain preventive protocols, quality metrics, and best practice recommendations. A written personalized care plan for preventive services as well as general preventive health recommendations were provided to patient.     Clemetine Marker, LPN   [id number]   Nurse Notes: pt c/o weakness in legs. Pt stopped taking atorvastatin in March due to possible side effects. Labs by nephrology on 01/27/22 total cholesterol 203 with LDL 123. Please advise patient regarding cholesterol.   Pt starts outpatient physical therapy tomorrow for knees and legs, ordered  by Dr. Candiss Norse

## 2022-03-04 DIAGNOSIS — M25561 Pain in right knee: Secondary | ICD-10-CM | POA: Diagnosis not present

## 2022-03-04 DIAGNOSIS — R2689 Other abnormalities of gait and mobility: Secondary | ICD-10-CM | POA: Diagnosis not present

## 2022-03-04 DIAGNOSIS — M25562 Pain in left knee: Secondary | ICD-10-CM | POA: Diagnosis not present

## 2022-03-25 DIAGNOSIS — M25561 Pain in right knee: Secondary | ICD-10-CM | POA: Diagnosis not present

## 2022-03-25 DIAGNOSIS — R2689 Other abnormalities of gait and mobility: Secondary | ICD-10-CM | POA: Diagnosis not present

## 2022-03-25 DIAGNOSIS — M25562 Pain in left knee: Secondary | ICD-10-CM | POA: Diagnosis not present

## 2022-04-01 DIAGNOSIS — M25561 Pain in right knee: Secondary | ICD-10-CM | POA: Diagnosis not present

## 2022-04-01 DIAGNOSIS — M25562 Pain in left knee: Secondary | ICD-10-CM | POA: Diagnosis not present

## 2022-04-01 DIAGNOSIS — R2689 Other abnormalities of gait and mobility: Secondary | ICD-10-CM | POA: Diagnosis not present

## 2022-04-06 DIAGNOSIS — M25561 Pain in right knee: Secondary | ICD-10-CM | POA: Diagnosis not present

## 2022-04-06 DIAGNOSIS — M25562 Pain in left knee: Secondary | ICD-10-CM | POA: Diagnosis not present

## 2022-04-06 DIAGNOSIS — R2689 Other abnormalities of gait and mobility: Secondary | ICD-10-CM | POA: Diagnosis not present

## 2022-04-13 DIAGNOSIS — M25562 Pain in left knee: Secondary | ICD-10-CM | POA: Diagnosis not present

## 2022-04-13 DIAGNOSIS — R2689 Other abnormalities of gait and mobility: Secondary | ICD-10-CM | POA: Diagnosis not present

## 2022-04-13 DIAGNOSIS — M25561 Pain in right knee: Secondary | ICD-10-CM | POA: Diagnosis not present

## 2022-04-15 DIAGNOSIS — C44219 Basal cell carcinoma of skin of left ear and external auricular canal: Secondary | ICD-10-CM | POA: Diagnosis not present

## 2022-04-15 DIAGNOSIS — D2262 Melanocytic nevi of left upper limb, including shoulder: Secondary | ICD-10-CM | POA: Diagnosis not present

## 2022-04-15 DIAGNOSIS — D225 Melanocytic nevi of trunk: Secondary | ICD-10-CM | POA: Diagnosis not present

## 2022-04-15 DIAGNOSIS — C44311 Basal cell carcinoma of skin of nose: Secondary | ICD-10-CM | POA: Diagnosis not present

## 2022-04-15 DIAGNOSIS — D485 Neoplasm of uncertain behavior of skin: Secondary | ICD-10-CM | POA: Diagnosis not present

## 2022-04-15 DIAGNOSIS — L570 Actinic keratosis: Secondary | ICD-10-CM | POA: Diagnosis not present

## 2022-04-15 DIAGNOSIS — D2261 Melanocytic nevi of right upper limb, including shoulder: Secondary | ICD-10-CM | POA: Diagnosis not present

## 2022-04-15 DIAGNOSIS — L821 Other seborrheic keratosis: Secondary | ICD-10-CM | POA: Diagnosis not present

## 2022-04-15 DIAGNOSIS — D2272 Melanocytic nevi of left lower limb, including hip: Secondary | ICD-10-CM | POA: Diagnosis not present

## 2022-04-15 DIAGNOSIS — C4401 Basal cell carcinoma of skin of lip: Secondary | ICD-10-CM | POA: Diagnosis not present

## 2022-04-20 DIAGNOSIS — R2689 Other abnormalities of gait and mobility: Secondary | ICD-10-CM | POA: Diagnosis not present

## 2022-04-20 DIAGNOSIS — M25561 Pain in right knee: Secondary | ICD-10-CM | POA: Diagnosis not present

## 2022-04-20 DIAGNOSIS — M25562 Pain in left knee: Secondary | ICD-10-CM | POA: Diagnosis not present

## 2022-04-23 DIAGNOSIS — R059 Cough, unspecified: Secondary | ICD-10-CM | POA: Diagnosis not present

## 2022-04-23 DIAGNOSIS — R0981 Nasal congestion: Secondary | ICD-10-CM | POA: Diagnosis not present

## 2022-04-29 DIAGNOSIS — M25562 Pain in left knee: Secondary | ICD-10-CM | POA: Diagnosis not present

## 2022-04-29 DIAGNOSIS — M25561 Pain in right knee: Secondary | ICD-10-CM | POA: Diagnosis not present

## 2022-04-29 DIAGNOSIS — R2689 Other abnormalities of gait and mobility: Secondary | ICD-10-CM | POA: Diagnosis not present

## 2022-05-02 ENCOUNTER — Encounter: Payer: Self-pay | Admitting: Internal Medicine

## 2022-05-02 ENCOUNTER — Ambulatory Visit (INDEPENDENT_AMBULATORY_CARE_PROVIDER_SITE_OTHER): Payer: PPO | Admitting: Internal Medicine

## 2022-05-02 VITALS — BP 124/80 | HR 82 | Temp 97.8°F | Resp 16 | Ht 64.0 in | Wt 149.1 lb

## 2022-05-02 DIAGNOSIS — N1832 Chronic kidney disease, stage 3b: Secondary | ICD-10-CM

## 2022-05-02 DIAGNOSIS — Z8739 Personal history of other diseases of the musculoskeletal system and connective tissue: Secondary | ICD-10-CM | POA: Diagnosis not present

## 2022-05-02 DIAGNOSIS — H6123 Impacted cerumen, bilateral: Secondary | ICD-10-CM | POA: Diagnosis not present

## 2022-05-02 DIAGNOSIS — E039 Hypothyroidism, unspecified: Secondary | ICD-10-CM

## 2022-05-02 DIAGNOSIS — K743 Primary biliary cirrhosis: Secondary | ICD-10-CM | POA: Diagnosis not present

## 2022-05-02 DIAGNOSIS — I10 Essential (primary) hypertension: Secondary | ICD-10-CM

## 2022-05-02 DIAGNOSIS — E782 Mixed hyperlipidemia: Secondary | ICD-10-CM

## 2022-05-02 MED ORDER — LEVOTHYROXINE SODIUM 75 MCG PO TABS: 75.0000 ug | ORAL_TABLET | Freq: Every day | ORAL | 1 refills | Status: DC

## 2022-05-02 MED ORDER — AMLODIPINE BESYLATE 2.5 MG PO TABS: 2.5000 mg | ORAL_TABLET | Freq: Every day | ORAL | 1 refills | Status: AC

## 2022-05-02 MED ORDER — MECLIZINE HCL 25 MG PO TABS: 25.0000 mg | ORAL_TABLET | ORAL | 0 refills | Status: AC | PRN

## 2022-05-02 NOTE — Progress Notes (Signed)
Established Patient Office Visit  Subjective   Patient ID: Caitlin Jenkins, female    DOB: 1932/01/20  Age: 88 y.o. MRN: [id number]  Chief Complaint  Patient presents with   Follow-up   Hypertension   Hypothyroidism    HPI Patient is here today for follow up on chronic medical conditions.  Hypertension: -Medications: Amlodipine 2.5 mg, Losartan 100 mg  -Patient is compliant with above medications and reports no side effects. -Checking BP at home (average): 139-145/70-80 but lower in the last 2 weeks  -Denies any SOB, CP, vision changes, LE edema. Is having some dizziness in the morning when she wakes up, not at any other time of day. Usually passes in a few minutes on its own. Take blood pressure medication later in the morning. Did have a viral cold about 2 weeks ago, other symptoms resolving. Taking Meclizine as needed.   HLD: -Medications: Nothing currently - had been on a statin but stopped it last year due to increase muscle pains. -Last lipid panel: Lipid Panel - most recent results from care everywhere - 01/27/22 Total 203, LDL 123, HDL 55, triglycerides 133    Component Value Date/Time   CHOL 161 03/31/2021 0410   CHOL 149 03/31/2017 0819   TRIG 79 03/31/2021 0410   HDL 72 03/31/2021 0410   HDL 54 03/31/2017 0819   CHOLHDL 2.2 03/31/2021 0410   VLDL 16 03/31/2021 0410   LDLCALC 73 03/31/2021 0410   LDLCALC 50 02/18/2021 0000   LABVLDL 20 03/31/2017 0819   Hypothyroidism: -Medications: Levothyroxine 75 mcg  -Patient is compliant with the above medication (s) at the above dose and reports no medication side effects.  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  HENT:  Positive for hearing loss. Negative for ear discharge and ear pain.   Respiratory:  Negative for shortness of breath.   Cardiovascular:  Negative for chest pain.  Neurological:  Positive for dizziness.      Objective:     BP 124/80   Pulse 82   Temp 97.8 F (36.6 C)   Resp 16   Ht '5\' 4"'$  (1.626 m)   Wt 149 lb 1.6 oz (67.6 kg)   SpO2 93%   BMI 25.59 kg/m  BP Readings from Last 3 Encounters:  05/02/22 124/80  03/03/22 138/82  11/02/21 (!) 170/90   Wt Readings from Last 3 Encounters:  05/02/22 149 lb 1.6 oz (67.6 kg)  03/03/22 152 lb 4.8 oz (69.1 kg)  11/02/21 151 lb 8 oz (68.7 kg)      Physical Exam Constitutional:      Appearance: Normal appearance.  HENT:     Head: Normocephalic and atraumatic.     Right Ear: Ear canal and external ear normal. There is impacted cerumen.     Left Ear: Ear canal and external ear normal. There is impacted cerumen.  Ears:     Comments: Wears hearing aids bilaterally, HOH Eyes:     Conjunctiva/sclera: Conjunctivae normal.  Cardiovascular:     Rate and Rhythm: Normal rate and regular rhythm.  Pulmonary:     Effort: Pulmonary effort is normal.     Breath sounds: Normal breath sounds.  Musculoskeletal:     Right lower leg: No edema.     Left lower leg: No edema.  Skin:    General: Skin is warm and dry.  Neurological:     General: No focal deficit present.     Mental Status: She is alert. Mental status is at baseline.  Psychiatric:        Mood and Affect: Mood normal.        Behavior: Behavior normal.      No results found for any visits on 05/02/22.  Last CBC Lab Results  Component  Value Date   WBC 8.3 03/31/2021   HGB 11.9 (L) 03/31/2021   HCT 35.2 (L) 03/31/2021   MCV 90.0 03/31/2021   MCH 30.4 03/31/2021   RDW 13.2 03/31/2021   PLT 246 43/32/9518   Last metabolic panel Lab Results  Component Value Date   GLUCOSE 86 07/13/2021   NA 132 (L) 07/13/2021   K 4.6 07/13/2021   CL 98 07/13/2021   CO2 24 07/13/2021   BUN 25 07/13/2021   CREATININE 1.46 (H) 07/13/2021   GFRNONAA 58 (L) 03/31/2021   CALCIUM 10.2 07/13/2021   PROT 7.2 07/13/2021   ALBUMIN 3.8 03/31/2021   LABGLOB 3.2 05/24/2016   AGRATIO 1.3 05/24/2016   BILITOT 0.4 07/13/2021   ALKPHOS 93 03/31/2021   AST 21 07/13/2021   ALT 8 07/13/2021   ANIONGAP 7 03/31/2021   Last lipids Lab Results  Component Value Date   CHOL 161 03/31/2021   HDL 72 03/31/2021   LDLCALC 73 03/31/2021   TRIG 79 03/31/2021   CHOLHDL 2.2 03/31/2021   Last hemoglobin A1c Lab Results  Component Value Date   HGBA1C 5.4 03/31/2021   Last thyroid functions Lab Results  Component Value Date   TSH 2.64 07/13/2021   Last vitamin D Lab Results  Component Value Date   VD25OH 36 09/29/2017   Last vitamin B12 and Folate Lab Results  Component Value Date   VITAMINB12 1,155 (H) 03/31/2021      The ASCVD Risk score (Arnett DK, et al., 2019) failed to calculate for the following reasons:   The 2019 ASCVD risk score is only valid for ages 46 to 63    Assessment & Plan:   1. Essential hypertension, benign: Chronic and stable.  Blood pressure here today at goal.  Continue amlodipine 2.5 mg, refill sent today.  Also continue losartan 100 mg daily.  Due for annual labs, CBC, CMP today.  Follow-up in 6 months.  - COMPLETE METABOLIC PANEL WITH GFR - CBC w/Diff/Platelet - amLODipine (NORVASC) 2.5 MG tablet; Take 1 tablet (2.5 mg total) by mouth daily.  Dispense: 90 tablet; Refill: 1  2. Hypothyroidism, unspecified type: Will recheck TSH today.  Continue levothyroxine 75 mcg daily, refills today.  - TSH -  levothyroxine (SYNTHROID) 75 MCG tablet; Take 1 tablet (75 mcg total) by mouth daily.  Dispense: 90 tablet; Refill: 1  3. 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  Following with GI, note reviewed from 03/08/2021.  Following up with them later this week.  4. Stage 3b chronic kidney disease ([place]): Chronic and stable.  Following with nephrology, note and labs from 01/27/2022 reviewed.  5. Mixed hyperlipidemia: Reviewed lipid panel from May of this year.  Since patient has gone off of her statin, LDL elevated at 123, total cholesterol 203.  Discussed benefits and risks of staying off the medication, the patient endorses muscle and joint pains while on the statin, which she is worried will limit her mobility and increase her risk of falls.  After reviewing the labs and discussing her cardiovascular risk, the patient has decided to continue to stay off of a statin medication and work on her diet.  6. History of gout: Doing really well, no flares of gout in the last 3 years.  7. Bilateral impacted cerumen: Patient complaining of morning dizziness, thought to be postviral labyrinthitis.  Bilateral deep cerumen impaction on exam.  Ear lavage performed today.  Recheck ears at follow-up.  - Ear Lavage   Return in about 6 months (around 11/02/2022).    Teodora Medici, DO

## 2022-05-02 NOTE — Patient Instructions (Signed)
It was great seeing you today!  Plan discussed at today's visit: -Blood work ordered today, results will be uploaded to [place].  -Medications refilled today -Continue to monitor blood pressure, especially in the mornings   Follow up in: 6 months   Take care and let us know if you have any questions or concerns prior to your next visit.  Dr. Rosana Berger

## 2022-05-03 LAB — COMPLETE METABOLIC PANEL WITH GFR
AG Ratio: 1.4 (calc) (ref 1.0–2.5)
ALT: 9 U/L (ref 6–29)
AST: 19 U/L (ref 10–35)
Albumin: 4.2 g/dL (ref 3.6–5.1)
Alkaline phosphatase (APISO): 112 U/L (ref 37–153)
BUN/Creatinine Ratio: 17 (calc) (ref 6–22)
BUN: 20 mg/dL (ref 7–25)
CO2: 23 mmol/L (ref 20–32)
Calcium: 9.8 mg/dL (ref 8.6–10.4)
Chloride: 104 mmol/L (ref 98–110)
Creat: 1.19 mg/dL — ABNORMAL HIGH (ref 0.60–0.95)
Globulin: 3.1 g/dL (calc) (ref 1.9–3.7)
Glucose, Bld: 92 mg/dL (ref 65–99)
Potassium: 5.1 mmol/L (ref 3.5–5.3)
Sodium: 137 mmol/L (ref 135–146)
Total Bilirubin: 0.5 mg/dL (ref 0.2–1.2)
Total Protein: 7.3 g/dL (ref 6.1–8.1)
eGFR: 44 mL/min/{1.73_m2} — ABNORMAL LOW (ref >=60)

## 2022-05-03 LAB — CBC WITH DIFFERENTIAL/PLATELET
Absolute Monocytes: 907 cells/uL (ref 200–950)
Basophils Absolute: 34 cells/uL (ref 0–200)
Basophils Relative: 0.4 %
Eosinophils Absolute: 630 cells/uL — ABNORMAL HIGH (ref 15–500)
Eosinophils Relative: 7.5 %
HCT: 39.4 % (ref 35.0–45.0)
Hemoglobin: 13.5 g/dL (ref 11.7–15.5)
Lymphs Abs: 1982 cells/uL (ref 850–3900)
MCH: 29.9 pg (ref 27.0–33.0)
MCHC: 34.3 g/dL (ref 32.0–36.0)
MCV: 87.2 fL (ref 80.0–100.0)
MPV: 9.8 fL (ref 7.5–12.5)
Monocytes Relative: 10.8 %
Neutro Abs: 4847 cells/uL (ref 1500–7800)
Neutrophils Relative %: 57.7 %
Platelets: 304 10*3/uL (ref 140–400)
RBC: 4.52 10*6/uL (ref 3.80–5.10)
RDW: 13.2 % (ref 11.0–15.0)
Total Lymphocyte: 23.6 %
WBC: 8.4 10*3/uL (ref 3.8–10.8)

## 2022-05-03 LAB — TSH: TSH: 2.7 mIU/L (ref 0.40–4.50)

## 2022-05-04 DIAGNOSIS — R2689 Other abnormalities of gait and mobility: Secondary | ICD-10-CM | POA: Diagnosis not present

## 2022-05-04 DIAGNOSIS — M25562 Pain in left knee: Secondary | ICD-10-CM | POA: Diagnosis not present

## 2022-05-04 DIAGNOSIS — M25561 Pain in right knee: Secondary | ICD-10-CM | POA: Diagnosis not present

## 2022-05-05 ENCOUNTER — Encounter: Payer: Self-pay | Admitting: Gastroenterology

## 2022-05-05 ENCOUNTER — Ambulatory Visit (INDEPENDENT_AMBULATORY_CARE_PROVIDER_SITE_OTHER): Payer: PPO | Admitting: Gastroenterology

## 2022-05-05 VITALS — BP 156/94 | HR 80 | Temp 97.9°F | Wt 152.0 lb

## 2022-05-05 DIAGNOSIS — K743 Primary biliary cirrhosis: Secondary | ICD-10-CM | POA: Diagnosis not present

## 2022-05-05 MED ORDER — URSODIOL 300 MG PO CAPS: 300.0000 mg | ORAL_CAPSULE | Freq: Every day | ORAL | 2 refills | Status: DC

## 2022-05-05 NOTE — Progress Notes (Signed)
Primary Care Physician: Teodora Medici, DO  Primary Gastroenterologist:  Dr. Lucilla Lame  Chief Complaint  Patient presents with   Follow-up    Pt denies any new concerns   Medication Refill    Ursodiol    HPI: Caitlin Jenkins is a 88 y.o. female here with a history of PBC.  The patient has been doing well with normal liver enzymes.  The patient is here for follow-up and to get a refill of her medications.  There is no report of any jaundice fevers chills nausea vomiting black stools or bloody stools.  The patient does report that she has fatigue but attributes this to her age.  Past Medical History:  Diagnosis Date   Biliary cirrhosis ([place])    Cancer of skin of leg    Cellulitis    CKD (chronic kidney disease) stage 3, GFR 30-59 ml/min (HCC)    Gout    Hiatal hernia Oct 2015   8.5cm   Hiatal hernia 06/26/2014   8.5cm    History of basal cell carcinoma (BCC) 09/12/2018   Oct 2016; Moh's surgery; right antitragus   History of diverticulitis 2000   Hyperlipidemia    Hyperparathyroidism, secondary renal ([place]) 07/11/2018   Managed by nephrologist   Hypertension    Hypomagnesemia 07/03/2019   Hypothyroidism    Skin cancer of nose     Current Outpatient Medications  Medication Sig Dispense Refill   acetaminophen (TYLENOL) 500 MG tablet Take 500 mg by mouth every 6 (six) hours as needed.     amLODipine (NORVASC) 2.5 MG tablet Take 1 tablet (2.5 mg total) by mouth daily. 90 tablet 1   Cholecalciferol (VITAMIN D-3) 25 MCG (1000 UT) CAPS Take 1 capsule by mouth daily.     COLACE 100 MG capsule Take 100 mg by mouth daily.      Colchicine 0.6 MG CAPS Take 0.6 mg by mouth daily as needed. 30 capsule 0   levothyroxine (SYNTHROID) 75 MCG tablet Take 1 tablet (75 mcg total) by mouth daily. 90 tablet 1   losartan (COZAAR) 100 MG tablet Take 1 tablet by mouth daily (Avoid salt substitutes, no fruit smoothies.) 90 tablet 3   meclizine (ANTIVERT) 25 MG tablet Take 1 tablet (25 mg  total) by mouth as needed for dizziness. 20 tablet 0   Turmeric 450 MG CAPS Take 2 capsules by mouth daily.     ursodiol (ACTIGALL) 300 MG capsule Take 1 capsule (300 mg total) by mouth daily. 90 capsule 2   Zinc 50 MG CAPS Take by mouth.     No current facility-administered medications for this visit.    Allergies as of 05/05/2022 - Review Complete 05/02/2022  Allergen Reaction Noted   Indomethacin Hives 10/14/2014   Levofloxacin in d5w Other (See Comments) 02/18/2021   Ace inhibitors Swelling 09/25/2014   Augmentin [amoxicillin-pot clavulanate] Other (See Comments) 03/12/2015   Elemental sulfur Other (See Comments) 03/13/2015   Penicillins Nausea And Vomiting 03/12/2015   Sulfa antibiotics Swelling and Other (See Comments) 07/03/2019    ROS:  General: Negative for anorexia, weight loss, fever, chills, fatigue, weakness. ENT: Negative for hoarseness, difficulty swallowing , nasal congestion. CV: Negative for chest pain, angina, palpitations, dyspnea on exertion, peripheral edema.  Respiratory: Negative for dyspnea at rest, dyspnea on exertion, cough, sputum, wheezing.  GI: See history of present illness. GU:  Negative for dysuria, hematuria, urinary incontinence, urinary frequency, nocturnal urination.  Endo: Negative for unusual weight change.    Physical  Examination:   BP (!) 156/94   Pulse 80   Temp 97.9 F (36.6 C) (Oral)   Wt 152 lb (68.9 kg)   BMI 26.09 kg/m   General: Well-nourished, well-developed in no acute distress.  Eyes: No icterus. Conjunctivae pink. Neuro: Alert and oriented x 3.  Grossly intact. Skin: Warm and dry, no jaundice.   Psych: Alert and cooperative, normal mood and affect.  Labs:    Imaging Studies: No results found.  Assessment and Plan:   Caitlin Jenkins is a 88 y.o. y/o female who comes in today with a history of PBC on ursodeoxycholic acid.  The patient has been doing well and will continue on the medication.  The patient will  follow-up as needed for medication refills typically on a yearly basis.  The patient has been explained the plan and agrees with it.     Lucilla Lame, MD. Caitlin Jenkins    Note: This dictation was prepared with Dragon dictation along with smaller phrase technology. Any transcriptional errors that result from this process are unintentional.

## 2022-05-11 DIAGNOSIS — R2689 Other abnormalities of gait and mobility: Secondary | ICD-10-CM | POA: Diagnosis not present

## 2022-05-11 DIAGNOSIS — M25562 Pain in left knee: Secondary | ICD-10-CM | POA: Diagnosis not present

## 2022-05-11 DIAGNOSIS — M25561 Pain in right knee: Secondary | ICD-10-CM | POA: Diagnosis not present

## 2022-05-18 DIAGNOSIS — M25561 Pain in right knee: Secondary | ICD-10-CM | POA: Diagnosis not present

## 2022-05-18 DIAGNOSIS — R2689 Other abnormalities of gait and mobility: Secondary | ICD-10-CM | POA: Diagnosis not present

## 2022-05-18 DIAGNOSIS — M25562 Pain in left knee: Secondary | ICD-10-CM | POA: Diagnosis not present

## 2022-05-19 DIAGNOSIS — L814 Other melanin hyperpigmentation: Secondary | ICD-10-CM | POA: Diagnosis not present

## 2022-05-19 DIAGNOSIS — C44219 Basal cell carcinoma of skin of left ear and external auricular canal: Secondary | ICD-10-CM | POA: Diagnosis not present

## 2022-05-19 DIAGNOSIS — G8918 Other acute postprocedural pain: Secondary | ICD-10-CM | POA: Diagnosis not present

## 2022-05-19 DIAGNOSIS — C4401 Basal cell carcinoma of skin of lip: Secondary | ICD-10-CM | POA: Diagnosis not present

## 2022-05-19 DIAGNOSIS — C44311 Basal cell carcinoma of skin of nose: Secondary | ICD-10-CM | POA: Diagnosis not present

## 2022-05-19 DIAGNOSIS — I781 Nevus, non-neoplastic: Secondary | ICD-10-CM | POA: Diagnosis not present

## 2022-05-19 DIAGNOSIS — L988 Other specified disorders of the skin and subcutaneous tissue: Secondary | ICD-10-CM | POA: Diagnosis not present

## 2022-05-23 DIAGNOSIS — M1990 Unspecified osteoarthritis, unspecified site: Secondary | ICD-10-CM | POA: Diagnosis not present

## 2022-05-23 DIAGNOSIS — K7460 Unspecified cirrhosis of liver: Secondary | ICD-10-CM | POA: Diagnosis not present

## 2022-05-23 DIAGNOSIS — Z515 Encounter for palliative care: Secondary | ICD-10-CM | POA: Diagnosis not present

## 2022-05-23 DIAGNOSIS — I10 Essential (primary) hypertension: Secondary | ICD-10-CM | POA: Diagnosis not present

## 2022-06-01 DIAGNOSIS — R2689 Other abnormalities of gait and mobility: Secondary | ICD-10-CM | POA: Diagnosis not present

## 2022-06-01 DIAGNOSIS — M25561 Pain in right knee: Secondary | ICD-10-CM | POA: Diagnosis not present

## 2022-06-01 DIAGNOSIS — M25562 Pain in left knee: Secondary | ICD-10-CM | POA: Diagnosis not present

## 2022-06-08 DIAGNOSIS — M25562 Pain in left knee: Secondary | ICD-10-CM | POA: Diagnosis not present

## 2022-06-08 DIAGNOSIS — M25561 Pain in right knee: Secondary | ICD-10-CM | POA: Diagnosis not present

## 2022-06-08 DIAGNOSIS — R2689 Other abnormalities of gait and mobility: Secondary | ICD-10-CM | POA: Diagnosis not present

## 2022-06-15 DIAGNOSIS — M25561 Pain in right knee: Secondary | ICD-10-CM | POA: Diagnosis not present

## 2022-06-15 DIAGNOSIS — M25562 Pain in left knee: Secondary | ICD-10-CM | POA: Diagnosis not present

## 2022-06-15 DIAGNOSIS — R2689 Other abnormalities of gait and mobility: Secondary | ICD-10-CM | POA: Diagnosis not present

## 2022-06-22 DIAGNOSIS — M25562 Pain in left knee: Secondary | ICD-10-CM | POA: Diagnosis not present

## 2022-06-22 DIAGNOSIS — R2689 Other abnormalities of gait and mobility: Secondary | ICD-10-CM | POA: Diagnosis not present

## 2022-06-22 DIAGNOSIS — M25561 Pain in right knee: Secondary | ICD-10-CM | POA: Diagnosis not present

## 2022-07-04 DIAGNOSIS — M25562 Pain in left knee: Secondary | ICD-10-CM | POA: Diagnosis not present

## 2022-07-04 DIAGNOSIS — M25561 Pain in right knee: Secondary | ICD-10-CM | POA: Diagnosis not present

## 2022-07-04 DIAGNOSIS — R2689 Other abnormalities of gait and mobility: Secondary | ICD-10-CM | POA: Diagnosis not present

## 2022-08-10 DIAGNOSIS — Z6828 Body mass index (BMI) 28.0-28.9, adult: Secondary | ICD-10-CM | POA: Diagnosis not present

## 2022-08-10 DIAGNOSIS — I10 Essential (primary) hypertension: Secondary | ICD-10-CM | POA: Diagnosis not present

## 2022-08-10 DIAGNOSIS — Z515 Encounter for palliative care: Secondary | ICD-10-CM | POA: Diagnosis not present

## 2022-08-11 ENCOUNTER — Other Ambulatory Visit: Payer: Self-pay | Admitting: Unknown Physician Specialty

## 2022-08-11 DIAGNOSIS — E039 Hypothyroidism, unspecified: Secondary | ICD-10-CM

## 2022-08-11 NOTE — Telephone Encounter (Signed)
Requested Prescriptions  Pending Prescriptions Disp Refills   levothyroxine (SYNTHROID) 75 MCG tablet [Pharmacy Med Name: LEVOTHYROXINE 75 MCG TABLET] 90 tablet 0    Sig: Take 1 tablet (75 mcg total) by mouth daily.     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

## 2022-08-15 ENCOUNTER — Other Ambulatory Visit: Payer: Self-pay | Admitting: Internal Medicine

## 2022-08-15 ENCOUNTER — Ambulatory Visit: Payer: Self-pay | Admitting: *Deleted

## 2022-08-15 ENCOUNTER — Other Ambulatory Visit: Payer: Self-pay | Admitting: Unknown Physician Specialty

## 2022-08-15 ENCOUNTER — Encounter: Payer: Self-pay | Admitting: Internal Medicine

## 2022-08-15 DIAGNOSIS — M109 Gout, unspecified: Secondary | ICD-10-CM

## 2022-08-15 MED ORDER — COLCHICINE 0.6 MG PO CAPS: 0.6000 mg | ORAL_CAPSULE | Freq: Every day | ORAL | 0 refills | Status: AC | PRN

## 2022-08-15 NOTE — Telephone Encounter (Signed)
Lvm to schedule pt an appt per message

## 2022-08-15 NOTE — Telephone Encounter (Signed)
Requested medication (s) are due for refill today - expired Rx  Requested medication (s) are on the active medication list -yes  Future visit scheduled -yes  Last refill: 02/25/21 #30  Notes to clinic: expired Rx  Requested Prescriptions  Pending Prescriptions Disp Refills   Colchicine 0.6 MG CAPS 30 capsule 0    Sig: Take 0.6 mg by mouth daily as needed.     Endocrinology:  Gout Agents - colchicine Failed - 08/15/2022 11:38 AM      Failed - Cr in normal range and within 360 days    Creat  Date Value Ref Range Status  05/02/2022 1.19 (H) 0.60 - 0.95 mg/dL Final         Passed - ALT in normal range and within 360 days    ALT  Date Value Ref Range Status  05/02/2022 9 6 - 29 U/L Final         Passed - AST in normal range and within 360 days    AST  Date Value Ref Range Status  05/02/2022 19 10 - 35 U/L Final         Passed - Valid encounter within last 12 months    Recent Outpatient Visits           3 months ago Essential hypertension, benign   [facility] Teodora Medici, DO   9 months ago Essential hypertension, benign   [facility] [place], Malachy Mood, NP   1 year ago 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 [facility])   [facility] Myles Gip, DO   1 year ago Essential hypertension, benign   [facility] Myles Gip, DO   1 year ago Encounter for screening involving social determinants of health (SDoH)   [facility] [place], Malachy Mood, NP       Future Appointments             In 2 months Delsa Grana, PA-C [facility], [place] within normal limits and completed in the last 12 months    WBC  Date Value Ref Range Status  05/02/2022 8.4 3.8 - 10.8 Thousand/uL Final   RBC  Date Value Ref Range Status  05/02/2022 4.52 3.80 - 5.10 Million/uL Final   Hemoglobin  Date Value Ref Range Status  05/02/2022  13.5 11.7 - 15.5 g/dL Final  05/24/2016 13.2 11.1 - 15.9 g/dL Final   HCT  Date Value Ref Range Status  05/02/2022 39.4 35.0 - 45.0 % Final   Hematocrit  Date Value Ref Range Status  05/24/2016 38.9 34.0 - 46.6 % Final   MCHC  Date Value Ref Range Status  05/02/2022 34.3 32.0 - 36.0 g/dL Final   [facility]  Date Value Ref Range Status  05/02/2022 29.9 27.0 - 33.0 pg Final   MCV  Date Value Ref Range Status  05/02/2022 87.2 80.0 - 100.0 fL Final  05/24/2016 83 79 - 97 fL Final   No results found for: "PLTCOUNTKUC", "LABPLAT", "POCPLA" RDW  Date Value Ref Range Status  05/02/2022 13.2 11.0 - 15.0 % Final  05/24/2016 14.1 12.3 - 15.4 % Final            Requested Prescriptions  Pending Prescriptions Disp Refills   Colchicine 0.6 MG CAPS 30 capsule 0    Sig: Take 0.6 mg by mouth daily as needed.  Endocrinology:  Gout Agents - colchicine Failed - 08/15/2022 11:38 AM      Failed - Cr in normal range and within 360 days    Creat  Date Value Ref Range Status  05/02/2022 1.19 (H) 0.60 - 0.95 mg/dL Final         Passed - ALT in normal range and within 360 days    ALT  Date Value Ref Range Status  05/02/2022 9 6 - 29 U/L Final         Passed - AST in normal range and within 360 days    AST  Date Value Ref Range Status  05/02/2022 19 10 - 35 U/L Final         Passed - Valid encounter within last 12 months    Recent Outpatient Visits           3 months ago Essential hypertension, benign   [facility] Teodora Medici, DO   9 months ago Essential hypertension, benign   [facility] [place], Malachy Mood, NP   1 year ago 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 [facility])   [facility] Myles Gip, DO   1 year ago Essential hypertension, benign   [facility] Myles Gip, DO   1 year ago Encounter for screening involving social determinants of health (SDoH)   [facility] [place], Malachy Mood, NP       Future Appointments             In 2 months Delsa Grana, PA-C [facility], [place] within normal limits and completed in the last 12 months    WBC  Date Value Ref Range Status  05/02/2022 8.4 3.8 - 10.8 Thousand/uL Final   RBC  Date Value Ref Range Status  05/02/2022 4.52 3.80 - 5.10 Million/uL Final   Hemoglobin  Date Value Ref Range Status  05/02/2022 13.5 11.7 - 15.5 g/dL Final  05/24/2016 13.2 11.1 - 15.9 g/dL Final   HCT  Date Value Ref Range Status  05/02/2022 39.4 35.0 - 45.0 % Final   Hematocrit  Date Value Ref Range Status  05/24/2016 38.9 34.0 - 46.6 % Final   MCHC  Date Value Ref Range Status  05/02/2022 34.3 32.0 - 36.0 g/dL Final   [facility]  Date Value Ref Range Status  05/02/2022 29.9 27.0 - 33.0 pg Final   MCV  Date Value Ref Range Status  05/02/2022 87.2 80.0 - 100.0 fL Final  05/24/2016 83 79 - 97 fL Final   No results found for: "PLTCOUNTKUC", "LABPLAT", "POCPLA" RDW  Date Value Ref Range Status  05/02/2022 13.2 11.0 - 15.0 % Final  05/24/2016 14.1 12.3 - 15.4 % Final

## 2022-08-15 NOTE — Telephone Encounter (Signed)
Reason for Disposition  [1] Caller has URGENT medicine question about med that PCP or specialist prescribed AND [2] triager unable to answer question  Answer Assessment - Initial Assessment Questions 1. NAME of MEDICINE: "What medicine(s) are you calling about?"     Colchicine. 2. QUESTION: "What is your question?" (e.g., double dose of medicine, side effect)     Needs refill 3. PRESCRIBER: "Who prescribed the medicine?" Reason: if prescribed by specialist, call should be referred to that group.     CJulian Hy 02/25/21  Protocols used: Medication Question Call-A-AH

## 2022-08-15 NOTE — Telephone Encounter (Signed)
  Chief Complaint: Knee, hand, ankle joint pain Symptoms: States "Gout flare up." Swelling redness and warmth of joints "Where she has gout." Requesting refill of Colchicine, old script, 6/22.  Frequency: yesterday Pertinent Negatives: Patient denies  Disposition: '[]'$ ED /'[]'$ Urgent Care (no appt availability in office) / '[]'$ Appointment(In office/virtual)/ '[]'$  [facility]/ '[]'$ Home Care/ '[]'$ Refused Recommended Disposition /'[]'$ [place] [facility]/ '[x]'$  Follow-up with PCP Additional Notes: States they are at beach and can not travel with her in pain, where to leave today.  Requesting refill of med to be sent ASAP to [facility] in [place], [phone]

## 2022-08-15 NOTE — Telephone Encounter (Signed)
Medication Refill - Medication: Colchicine 0.6 MG CAPS   Has the patient contacted their pharmacy? No. Pt is out of town and having gout flare up  Preferred Pharmacy (with phone number or street name):  [place], [place] [address] Phone: [phone]  Fax: [phone]     Has the patient been seen for an appointment in the last year OR does the patient have an upcoming appointment? Yes.    Agent: Please be advised that RX refills may take up to 3 business days. We ask that you follow-up with your pharmacy.

## 2022-08-15 NOTE — Telephone Encounter (Signed)
Misroute- apologies  Requested Prescriptions  Pending Prescriptions Disp Refills   Colchicine 0.6 MG CAPS 30 capsule 0    Sig: Take 0.6 mg by mouth daily as needed.     Endocrinology:  Gout Agents - colchicine Failed - 08/15/2022 11:38 AM      Failed - Cr in normal range and within 360 days    Creat  Date Value Ref Range Status  05/02/2022 1.19 (H) 0.60 - 0.95 mg/dL Final         Passed - ALT in normal range and within 360 days    ALT  Date Value Ref Range Status  05/02/2022 9 6 - 29 U/L Final         Passed - AST in normal range and within 360 days    AST  Date Value Ref Range Status  05/02/2022 19 10 - 35 U/L Final         Passed - Valid encounter within last 12 months    Recent Outpatient Visits           3 months ago Essential hypertension, benign   [facility] Caitlin Medici, DO   9 months ago Essential hypertension, benign   [facility], Caitlin Mood, NP   1 year ago 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 [facility])   [facility] Caitlin Gip, DO   1 year ago Essential hypertension, benign   [facility] Caitlin Gip, DO   1 year ago Encounter for screening involving social determinants of health (SDoH)   [facility] [place], Caitlin Mood, NP       Future Appointments             In 2 months Delsa Grana, PA-C [facility] [place] within normal limits and completed in the last 12 months    WBC  Date Value Ref Range Status  05/02/2022 8.4 3.8 - 10.8 Thousand/uL Final   RBC  Date Value Ref Range Status  05/02/2022 4.52 3.80 - 5.10 Million/uL Final   Hemoglobin  Date Value Ref Range Status  05/02/2022 13.5 11.7 - 15.5 g/dL Final  05/24/2016 13.2 11.1 - 15.9 g/dL Final   HCT  Date Value Ref Range Status  05/02/2022 39.4 35.0 - 45.0 % Final   Hematocrit  Date Value Ref Range Status   05/24/2016 38.9 34.0 - 46.6 % Final   MCHC  Date Value Ref Range Status  05/02/2022 34.3 32.0 - 36.0 g/dL Final   [facility]  Date Value Ref Range Status  05/02/2022 29.9 27.0 - 33.0 pg Final   MCV  Date Value Ref Range Status  05/02/2022 87.2 80.0 - 100.0 fL Final  05/24/2016 83 79 - 97 fL Final   No results found for: "PLTCOUNTKUC", "LABPLAT", "POCPLA" RDW  Date Value Ref Range Status  05/02/2022 13.2 11.0 - 15.0 % Final  05/24/2016 14.1 12.3 - 15.4 % Final            Requested Prescriptions  Pending Prescriptions Disp Refills   Colchicine 0.6 MG CAPS 30 capsule 0    Sig: Take 0.6 mg by mouth daily as needed.     Endocrinology:  Gout Agents - colchicine Failed - 08/15/2022 11:38 AM      Failed - Cr in normal range and within 360 days    Creat  Date Value Ref Range Status  05/02/2022 1.19 (H) 0.60 - 0.95 mg/dL Final         Passed - ALT in normal range and within 360 days    ALT  Date Value Ref Range Status  05/02/2022 9 6 - 29 U/L Final         Passed - AST in normal range and within 360 days    AST  Date Value Ref Range Status  05/02/2022 19 10 - 35 U/L Final         Passed - Valid encounter within last 12 months    Recent Outpatient Visits           3 months ago Essential hypertension, benign   [facility] Caitlin Medici, DO   9 months ago Essential hypertension, benign   [facility] [place], Caitlin Mood, NP   1 year ago 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 [facility])   [facility] Caitlin Gip, DO   1 year ago Essential hypertension, benign   [facility] Caitlin Gip, DO   1 year ago Encounter for screening involving social determinants of health (SDoH)   [place], NP       Future Appointments             In 2 months Delsa Grana, PA-C [facility], [place] within  normal limits and completed in the last 12 months    WBC  Date Value Ref Range Status  05/02/2022 8.4 3.8 - 10.8 Thousand/uL Final   RBC  Date Value Ref Range Status  05/02/2022 4.52 3.80 - 5.10 Million/uL Final   Hemoglobin  Date Value Ref Range Status  05/02/2022 13.5 11.7 - 15.5 g/dL Final  05/24/2016 13.2 11.1 - 15.9 g/dL Final   HCT  Date Value Ref Range Status  05/02/2022 39.4 35.0 - 45.0 % Final   Hematocrit  Date Value Ref Range Status  05/24/2016 38.9 34.0 - 46.6 % Final   MCHC  Date Value Ref Range Status  05/02/2022 34.3 32.0 - 36.0 g/dL Final   [facility]  Date Value Ref Range Status  05/02/2022 29.9 27.0 - 33.0 pg Final   MCV  Date Value Ref Range Status  05/02/2022 87.2 80.0 - 100.0 fL Final  05/24/2016 83 79 - 97 fL Final   No results found for: "PLTCOUNTKUC", "LABPLAT", "POCPLA" RDW  Date Value Ref Range Status  05/02/2022 13.2 11.0 - 15.0 % Final  05/24/2016 14.1 12.3 - 15.4 % Final

## 2022-09-05 DIAGNOSIS — H524 Presbyopia: Secondary | ICD-10-CM | POA: Diagnosis not present

## 2022-09-05 DIAGNOSIS — H02883 Meibomian gland dysfunction of right eye, unspecified eyelid: Secondary | ICD-10-CM | POA: Diagnosis not present

## 2022-09-05 DIAGNOSIS — Z961 Presence of intraocular lens: Secondary | ICD-10-CM | POA: Diagnosis not present

## 2022-09-05 DIAGNOSIS — H0012 Chalazion right lower eyelid: Secondary | ICD-10-CM | POA: Diagnosis not present

## 2022-09-05 DIAGNOSIS — H02886 Meibomian gland dysfunction of left eye, unspecified eyelid: Secondary | ICD-10-CM | POA: Diagnosis not present

## 2022-09-05 DIAGNOSIS — H35033 Hypertensive retinopathy, bilateral: Secondary | ICD-10-CM | POA: Diagnosis not present

## 2022-09-07 DIAGNOSIS — M79672 Pain in left foot: Secondary | ICD-10-CM | POA: Diagnosis not present

## 2022-09-07 DIAGNOSIS — M79671 Pain in right foot: Secondary | ICD-10-CM | POA: Diagnosis not present

## 2022-09-12 DIAGNOSIS — Z8739 Personal history of other diseases of the musculoskeletal system and connective tissue: Secondary | ICD-10-CM | POA: Diagnosis not present

## 2022-09-12 DIAGNOSIS — Z09 Encounter for follow-up examination after completed treatment for conditions other than malignant neoplasm: Secondary | ICD-10-CM | POA: Diagnosis not present

## 2022-10-05 DIAGNOSIS — D2261 Melanocytic nevi of right upper limb, including shoulder: Secondary | ICD-10-CM | POA: Diagnosis not present

## 2022-10-05 DIAGNOSIS — X32XXXA Exposure to sunlight, initial encounter: Secondary | ICD-10-CM | POA: Diagnosis not present

## 2022-10-05 DIAGNOSIS — Z85828 Personal history of other malignant neoplasm of skin: Secondary | ICD-10-CM | POA: Diagnosis not present

## 2022-10-05 DIAGNOSIS — D2262 Melanocytic nevi of left upper limb, including shoulder: Secondary | ICD-10-CM | POA: Diagnosis not present

## 2022-10-05 DIAGNOSIS — L570 Actinic keratosis: Secondary | ICD-10-CM | POA: Diagnosis not present

## 2022-10-05 DIAGNOSIS — D2272 Melanocytic nevi of left lower limb, including hip: Secondary | ICD-10-CM | POA: Diagnosis not present

## 2022-10-22 DIAGNOSIS — M2550 Pain in unspecified joint: Secondary | ICD-10-CM | POA: Diagnosis not present

## 2022-11-03 ENCOUNTER — Ambulatory Visit: Payer: PPO | Admitting: Family Medicine

## 2023-01-23 ENCOUNTER — Telehealth: Payer: Self-pay | Admitting: Internal Medicine

## 2023-01-23 NOTE — Telephone Encounter (Signed)
Contacted Milynn ARMELLA STOGNER to schedule their annual wellness visit. Appointment made for 02/17/2023.  [facility] Care Guide [place] [place] [facility] AWV TEAM Direct Dial: [phone]

## 2023-02-07 DIAGNOSIS — I129 Hypertensive chronic kidney disease with stage 1 through stage 4 chronic kidney disease, or unspecified chronic kidney disease: Secondary | ICD-10-CM | POA: Diagnosis not present

## 2023-02-07 DIAGNOSIS — N2581 Secondary hyperparathyroidism of renal origin: Secondary | ICD-10-CM | POA: Diagnosis not present

## 2023-02-07 DIAGNOSIS — N1832 Chronic kidney disease, stage 3b: Secondary | ICD-10-CM | POA: Diagnosis not present

## 2023-02-07 DIAGNOSIS — R809 Proteinuria, unspecified: Secondary | ICD-10-CM | POA: Diagnosis not present

## 2023-02-07 DIAGNOSIS — R296 Repeated falls: Secondary | ICD-10-CM | POA: Diagnosis not present

## 2023-02-07 DIAGNOSIS — E871 Hypo-osmolality and hyponatremia: Secondary | ICD-10-CM | POA: Diagnosis not present

## 2023-02-07 DIAGNOSIS — N1831 Chronic kidney disease, stage 3a: Secondary | ICD-10-CM | POA: Diagnosis not present

## 2023-02-14 DIAGNOSIS — I129 Hypertensive chronic kidney disease with stage 1 through stage 4 chronic kidney disease, or unspecified chronic kidney disease: Secondary | ICD-10-CM | POA: Diagnosis not present

## 2023-02-14 DIAGNOSIS — N2581 Secondary hyperparathyroidism of renal origin: Secondary | ICD-10-CM | POA: Diagnosis not present

## 2023-02-14 DIAGNOSIS — M1000 Idiopathic gout, unspecified site: Secondary | ICD-10-CM | POA: Diagnosis not present

## 2023-02-14 DIAGNOSIS — N1831 Chronic kidney disease, stage 3a: Secondary | ICD-10-CM | POA: Diagnosis not present

## 2023-02-17 ENCOUNTER — Ambulatory Visit (INDEPENDENT_AMBULATORY_CARE_PROVIDER_SITE_OTHER): Payer: PPO

## 2023-02-17 VITALS — Wt 145.0 lb

## 2023-02-17 DIAGNOSIS — Z0000 Encounter for general adult medical examination without abnormal findings: Secondary | ICD-10-CM | POA: Diagnosis not present

## 2023-02-17 NOTE — Progress Notes (Signed)
Subjective:   Caitlin Jenkins is a 89 y.o. female who presents for Medicare Annual (Subsequent) preventive examination.  Review of Systems    I connected with  Caitlin Jenkins on 02/17/23 by a audio enabled telemedicine application and verified that I am speaking with the correct person using two identifiers.  Patient Location: Home  Provider Location: Home Office  I discussed the limitations of evaluation and management by telemedicine. The patient expressed understanding and agreed to proceed.  Cardiac Risk Factors include: advanced age (>54men, >103 women)     Objective:    Today's Vitals   02/17/23 1044  Weight: 145 lb (65.8 kg)   Body mass index is 24.89 kg/m.     02/17/2023   11:03 AM 03/03/2022    1:57 PM 09/08/2021   11:53 AM 08/27/2021    5:52 PM 03/30/2021    1:01 PM 03/02/2021    1:46 PM 02/27/2020    2:57 PM  Advanced Directives  Does Patient Have a Medical Advance Directive? No Yes No No Yes Yes Yes  Type of Furniture conservator/restorer;Living will   Healthcare Power of [place];Living will Healthcare Power of [place];Living will Healthcare Power of [place];Living will  Does patient want to make changes to medical advance directive?     No - Patient declined    Copy of Healthcare Power of Attorney in Chart?  Yes - validated most recent copy scanned in chart (See row information)   No - copy requested Yes - validated most recent copy scanned in chart (See row information) Yes - validated most recent copy scanned in chart (See row information)  Would patient like information on creating a medical advance directive? Yes (MAU/Ambulatory/Procedural Areas - Information given)          Current Medications (verified) Outpatient Encounter Medications as of 02/17/2023  Medication Sig   acetaminophen (TYLENOL) 500 MG tablet Take 500 mg by mouth every 6 (six) hours as needed.   amLODipine (NORVASC) 2.5 MG tablet Take 1 tablet (2.5 mg total) by mouth  daily.   Cholecalciferol (VITAMIN D-3) 25 MCG (1000 UT) CAPS Take 1 capsule by mouth daily.   COLACE 100 MG capsule Take 100 mg by mouth daily.    Colchicine 0.6 MG CAPS Take 0.6 mg by mouth daily as needed.   levothyroxine (SYNTHROID) 75 MCG tablet Take 1 tablet (75 mcg total) by mouth daily.   losartan (COZAAR) 100 MG tablet Take 1 tablet by mouth daily (Avoid salt substitutes, no fruit smoothies.)   meclizine (ANTIVERT) 25 MG tablet Take 1 tablet (25 mg total) by mouth as needed for dizziness.   Turmeric 450 MG CAPS Take 2 capsules by mouth daily.   ursodiol (ACTIGALL) 300 MG capsule Take 1 capsule (300 mg total) by mouth daily.   Zinc 50 MG CAPS Take by mouth.   No facility-administered encounter medications on file as of 02/17/2023.    Allergies (verified) Indomethacin, Levofloxacin in d5w, Ace inhibitors, Augmentin [amoxicillin-pot clavulanate], Elemental sulfur, Penicillins, and Sulfa antibiotics   History: Past Medical History:  Diagnosis Date   Biliary cirrhosis (HCC)    Cancer of skin of leg    Cellulitis    CKD (chronic kidney disease) stage 3, GFR 30-59 ml/min (HCC)    Gout    Hiatal hernia Oct 2015   8.5cm   Hiatal hernia 06/26/2014   8.5cm    History of basal cell carcinoma (BCC) 09/12/2018   Oct 2016; Moh's surgery; right  antitragus   History of diverticulitis 2000   Hyperlipidemia    Hyperparathyroidism, secondary renal (HCC) 07/11/2018   Managed by nephrologist   Hypertension    Hypomagnesemia 07/03/2019   Hypothyroidism    Skin cancer of nose    Past Surgical History:  Procedure Laterality Date   ABDOMINAL HYSTERECTOMY  1995   CATARACT EXTRACTION     FOOT SURGERY  2000   HEMORRHOID SURGERY     THYROID SURGERY  1999   para thyroid   Family History  Problem Relation Age of Onset   Emphysema Father    Asthma Father    Hypertension Daughter    Heart disease Daughter        3 stents   Diabetes Daughter    Hypertension Son    Cancer Son         prostate   Diabetes Son    Heart disease Daughter        heart attack, 2 stents   Multiple sclerosis Daughter    Diabetes Daughter    Fibromyalgia Daughter    Diabetes Daughter    Social History   Socioeconomic History   Marital status: Widowed    Spouse name: Joe   Number of children: 4   Years of education: some college   Highest education level: 12th grade  Occupational History   Occupation: Retired  Tobacco Use   Smoking status: Never   Smokeless tobacco: Never   Tobacco comments:    smoking cessation materials not required  Vaping Use   Vaping Use: Never used  Substance and Sexual Activity   Alcohol use: No   Drug use: No   Sexual activity: Not Currently  Other Topics Concern   Not on file  Social History Narrative   Pt lives alone.    Active working in her yard with over 200 rose bushes.    Social Determinants of Health   Financial Resource Strain: Low Risk  (02/17/2023)   Overall Financial Resource Strain (CARDIA)    Difficulty of Paying Living Expenses: Not hard at all  Food Insecurity: No Food Insecurity (02/17/2023)   Hunger Vital Sign    Worried About Running Out of Food in the Last Year: Never true    Ran Out of Food in the Last Year: Never true  Transportation Needs: No Transportation Needs (02/17/2023)   PRAPARE - Administrator, Civil Service (Medical): No    Lack of Transportation (Non-Medical): No  Physical Activity: Insufficiently Active (02/17/2023)   Exercise Vital Sign    Days of Exercise per Week: 7 days    Minutes of Exercise per Session: 20 min  Stress: No Stress Concern Present (02/17/2023)   [employer] of Occupational Health - Occupational Stress Questionnaire    Feeling of Stress : Not at all  Social Connections: Socially Integrated (02/17/2023)   Social Connection and Isolation Panel [NHANES]    Frequency of Communication with Friends and Family: More than three times a week    Frequency of Social Gatherings with  Friends and Family: More than three times a week    Attends Religious Services: More than 4 times per year    Active Member of [employer] or Organizations: Yes    Attends Engineer, structural: More than 4 times per year    Marital Status: Married    Tobacco Counseling Counseling given: Yes Tobacco comments: smoking cessation materials not required   Clinical Intake:  Pre-visit preparation completed: Yes  Pain :  No/denies pain     BMI - recorded: 24.89 Nutritional Status: BMI of 19-24  Normal Nutritional Risks: None Diabetes: No  How often do you need to have someone help you when you read instructions, pamphlets, or other written materials from your doctor or pharmacy?: 1 - Never  Diabetic?NO  Interpreter Needed?: No  Information entered by :: Fredirick Maudlin   Activities of Daily Living    02/17/2023   10:51 AM 05/02/2022    9:38 AM  In your present state of health, do you have any difficulty performing the following activities:  Hearing? 1 1  Vision? 0 0  Difficulty concentrating or making decisions? 0 1  Walking or climbing stairs? 1 1  Dressing or bathing? 0 0  Doing errands, shopping? 0 0  Preparing Food and eating ? N   Using the Toilet? N   In the past six months, have you accidently leaked urine? N   Do you have problems with loss of bowel control? N   Managing your Medications? N   Managing your Finances? N   Housekeeping or managing your Housekeeping? Y     Patient Care Team: Margarita Mail, DO as PCP - General (Internal Medicine) Lamont Dowdy, MD as Consulting Physician (Internal Medicine) Midge Minium, MD as Consulting Physician (Gastroenterology) Debbrah Alar, MD (Dermatology)  Indicate any recent Medical Services you may have received from other than [facility] providers in the past year (date may be approximate).     Assessment:   This is a routine wellness examination for [place].  Hearing/Vision screen Hearing Screening -  Comments:: Hearing both ears  Vision Screening - Comments::  up to date with routine eye exams with  Dr Clydene Pugh   Dietary issues and exercise activities discussed: Current Exercise Habits: Home exercise routine, Time (Minutes): 60, Frequency (Times/Week): 2, Weekly Exercise (Minutes/Week): 120, Exercise limited by: None identified   Goals Addressed               This Visit's Progress     Patient Stated     I think I am doing very well now managing my health (pt-stated)   On track     Current Barriers:  Hearing loss Chronic Disease Management Support  Nurse Case Manager Clinical Goal(s):  Over the next 60 days, patient will not experience hospital admission. Hospital Admissions in last 6 months = 1 Over the next 30 days, the patient will demonstrate ongoing self health care management ability as evidenced by taking medications as prescribed, attending all medical appointments, remaining active, and calling CCM RN CM is additional needs arise  Interventions:  Assessed for improvement in cellulitis on ankle and legs Assessed for additional educational needs/health management needs Provided patient with contact information and encouraged to contact CCM RN Cm if additional needs arise. Discussed and encouraged engagement with [facility]  Patient Self Care Activities:  Self administers medications as prescribed Attends all scheduled provider appointments Calls pharmacy for medication refills Attends church or other social activities Performs ADL's independently Performs IADL's independently Calls provider office for new concerns or questions  Please see past updates related to this goal by clicking on the "Past Updates" button in the selected goal        Depression Screen    02/17/2023   10:47 AM 05/02/2022    9:38 AM 03/03/2022    1:55 PM 11/02/2021    1:27 PM 07/13/2021   10:17 AM 04/12/2021   11:16 AM 04/02/2021    1:45 PM  PHQ 2/9 Scores  PHQ - 2 Score 0 0 0 0 0 0 0   PHQ- 9 Score  0  0 0 0     Fall Risk    02/17/2023   10:51 AM 05/02/2022    9:36 AM 03/03/2022    1:58 PM 11/02/2021    1:27 PM 07/13/2021   10:16 AM  Fall Risk   Falls in the past year? 0 0 0 0 1  Number falls in past yr: 0 0 0 0 0  Injury with Fall? 0 0 0 0 1  Risk for fall due to : No Fall Risks  No Fall Risks No Fall Risks Impaired balance/gait  Follow up Falls prevention discussed;Falls evaluation completed  Falls prevention discussed Falls prevention discussed Falls prevention discussed    FALL RISK PREVENTION PERTAINING TO THE HOME:  Any stairs in or around the home? Yes  If so, are there any without handrails? No  Home free of loose throw rugs in walkways, pet beds, electrical cords, etc? No  Adequate lighting in your home to reduce risk of falls? Yes   ASSISTIVE DEVICES UTILIZED TO PREVENT FALLS:  Life alert? Yes  Use of a cane, walker or w/c? No  Grab bars in the bathroom? Yes  Shower chair or bench in shower? Yes  Elevated toilet seat or a handicapped toilet? Yes   TIMED UP AND GO:  Was the test performed?  No telelvisit  .  cognitive Function:        02/17/2023   10:48 AM 02/27/2020    2:11 PM 03/05/2019    2:35 PM 03/02/2018   10:59 AM  6CIT Screen  What Year? 0 points 0 points 0 points 0 points  What month? 0 points 0 points 0 points 0 points  What time? 0 points 0 points 0 points 0 points  Count back from 20 0 points 0 points 0 points 0 points  Months in reverse 2 points 0 points 0 points 0 points  Repeat phrase 0 points 0 points 0 points 0 points  Total Score 2 points 0 points 0 points 0 points    Immunizations Immunization History  Administered Date(s) Administered   Fluad Quad(high Dose 65+) 06/05/2019   Influenza, High Dose Seasonal PF 05/24/2016, 08/02/2017, 06/13/2018   Influenza, Seasonal, Injecte, Preservative Fre 07/22/2013, 07/15/2014   Influenza,inj,Quad PF,6+ Mos 06/19/2015   Influenza-Unspecified 07/18/2012, 07/21/2014, 08/02/2017    Pneumococcal Conjugate-13 05/16/2014   Pneumococcal Polysaccharide-23 09/26/2013   Tdap 08/27/2021   Zoster Recombinat (Shingrix) 07/24/2018, 11/07/2018   Zoster, Live 09/26/2010    TDAP status: Up to date  Flu Vaccine status: Due, Education has been provided regarding the importance of this vaccine. Advised may receive this vaccine at local pharmacy or Health Dept. Aware to provide a copy of the vaccination record if obtained from local pharmacy or Health Dept. Verbalized acceptance and understanding.  Pneumococcal vaccine status: Declined,  Education has been provided regarding the importance of this vaccine but patient still declined. Advised may receive this vaccine at local pharmacy or Health Dept. Aware to provide a copy of the vaccination record if obtained from local pharmacy or Health Dept. Verbalized acceptance and understanding.   Covid-19 vaccine status: Declined, Education has been provided regarding the importance of this vaccine but patient still declined. Advised may receive this vaccine at local pharmacy or Health Dept.or vaccine clinic. Aware to provide a copy of the vaccination record if obtained from local pharmacy or Health Dept. Verbalized acceptance  and understanding.  Qualifies for Shingles Vaccine? Yes   Zostavax completed Yes   Shingrix Completed?: Yes  Screening Tests Health Maintenance  Topic Date Due   DEXA SCAN  10/03/2016   INFLUENZA VACCINE  04/27/2023   Medicare Annual Wellness (AWV)  02/17/2024   DTaP/Tdap/Td (2 - Td or Tdap) 08/28/2031   Pneumonia Vaccine 62+ Years old  Completed   Zoster Vaccines- Shingrix  Completed   HPV VACCINES  Aged Out   COVID-19 Vaccine  Discontinued    Health Maintenance  Health Maintenance Due  Topic Date Due   DEXA SCAN  10/03/2016    Colorectal cancer screening: No longer required.   Mammogram status: No longer required due to age.  Bone Density Patient Declined   Lung Cancer Screening: (Low Dose CT Chest  recommended if Age 56-80 years, 30 pack-year currently smoking OR have quit w/in 15years.) does not qualify.   Lung Cancer Screening Referral: NO  Additional Screening:  Hepatitis C Screening: does not qualify; Completed due to age  Vision Screening: Recommended annual ophthalmology exams for early detection of glaucoma and other disorders of the eye. Is the patient up to date with their annual eye exam?  Yes  Who is the provider or what is the name of the office in which the patient attends annual eye exams? Dr Clydene Pugh  If pt is not established with a provider, would they like to be referred to a provider to establish care? No .   Dental Screening: Recommended annual dental exams for proper oral hygiene  Community Resource Referral / Chronic Care Management: CRR required this visit?  No   CCM required this visit?  No      Plan:     I have personally reviewed and noted the following in the patient's chart:   Medical and social history Use of alcohol, tobacco or illicit drugs  Current medications and supplements including opioid prescriptions. Patient is not currently taking opioid prescriptions. Functional ability and status Nutritional status Physical activity Advanced directives List of other physicians Hospitalizations, surgeries, and ER visits in previous 12 months Vitals Screenings to include cognitive, depression, and falls Referrals and appointments  In addition, I have reviewed and discussed with patient certain preventive protocols, quality metrics, and best practice recommendations. A written personalized care plan for preventive services as well as general preventive health recommendations were provided to patient.     Annabell Sabal, CMA   02/17/2023   Nurse Notes: Patient stated that  she has had several flare up with gouts back in Nov.  And has been treated with medication.

## 2023-02-17 NOTE — Patient Instructions (Signed)
Caitlin Jenkins , Thank you for taking time to come for your Medicare Wellness Visit. I appreciate your ongoing commitment to your health goals. Please review the following plan we discussed and let me know if I can assist you in the future.   These are the goals we discussed:  Goals       Patient Stated     I think I am doing very well now managing my health (pt-stated)      Current Barriers:  Hearing loss Chronic Disease Management Support  Nurse Case Manager Clinical Goal(s):  Over the next 60 days, patient will not experience hospital admission. Hospital Admissions in last 6 months = 1 Over the next 30 days, the patient will demonstrate ongoing self health care management ability as evidenced by taking medications as prescribed, attending all medical appointments, remaining active, and calling CCM RN CM is additional needs arise  Interventions:  Assessed for improvement in cellulitis on ankle and legs Assessed for additional educational needs/health management needs Provided patient with contact information and encouraged to contact CCM RN Cm if additional needs arise. Discussed and encouraged engagement with [facility]  Patient Self Care Activities:  Self administers medications as prescribed Attends all scheduled provider appointments Calls pharmacy for medication refills Attends church or other social activities Performs ADL's independently Performs IADL's independently Calls provider office for new concerns or questions  Please see past updates related to this goal by clicking on the "Past Updates" button in the selected goal        Other     DIET - INCREASE WATER INTAKE      Recommend to drink at least 6-8 8oz glasses of water per day.        This is a list of the screening recommended for you and due dates:  Health Maintenance  Topic Date Due   DEXA scan (bone density measurement)  10/03/2016   Flu Shot  04/27/2023   Medicare Annual Wellness Visit   02/17/2024   DTaP/Tdap/Td vaccine (2 - Td or Tdap) 08/28/2031   Pneumonia Vaccine  Completed   Zoster (Shingles) Vaccine  Completed   HPV Vaccine  Aged Out   COVID-19 Vaccine  Discontinued    Advanced directives: Advance directive discussed with you today. I have provided a copy for you to complete at home and have notarized. Once this is complete please bring a copy in to our office so we can scan it into your chart.   Conditions/risks identified:Gout  Gout is painful swelling of your joints. Gout is a type of arthritis. It is caused by having too much uric acid in your body. Uric acid is a chemical that is made when your body breaks down substances called purines. If your body has too much uric acid, sharp crystals can form and build up in your joints. This causes pain and swelling. Gout attacks can happen quickly and be very painful (acute gout). Over time, the attacks can affect more joints and happen more often (chronic gout). What are the causes? Gout is caused by too much uric acid in your blood. This can happen because: Your kidneys do not remove enough uric acid from your blood. Your body makes too much uric acid. You eat too many foods that are high in purines. These foods include organ meats, some seafood, and beer. Trauma or stress can bring on an attack. What increases the risk? Having a family history of gout. Being female and middle-aged. Being female and having gone  through menopause. Having an organ transplant. Taking certain medicines. Having certain conditions, such as: Being very overweight (obese). Lead poisoning. Kidney disease. A skin condition called psoriasis. Other risks include: Losing weight too quickly. Not having enough water in the body (being dehydrated). Drinking alcohol, especially beer. Drinking beverages that are sweetened with a type of sugar called fructose. What are the signs or symptoms? An attack of acute gout often starts at night and  usually happens in just one joint. The most common place is the big toe. Other joints that may be affected include joints of the feet, ankle, knee, fingers, wrist, or elbow. Symptoms may include: Very bad pain. Warmth. Swelling. Stiffness. Tenderness. The affected joint may be very painful to touch. Shiny, red, or purple skin. Chills and fever. Chronic gout may cause symptoms more often. More joints may be involved. You may also have white or yellow lumps (tophi) on your hands or feet or in other areas near your joints. How is this treated? Treatment for an acute attack may include medicines for pain and swelling, such as: NSAIDs, such as ibuprofen. Steroids taken by mouth or injected into a joint. Colchicine. This can be given by mouth or through an IV tube. Treatment to prevent future attacks may include: Taking small doses of NSAIDs or colchicine daily. Using a medicine that reduces uric acid levels in your blood, such as allopurinol. Making changes to your diet. You may need to see a food expert (dietitian) about what to eat and drink to prevent gout. Follow these instructions at home: During a gout attack  If told, put ice on the painful area. To do this: Put ice in a plastic bag. Place a towel between your skin and the bag. Leave the ice on for 20 minutes, 2-3 times a day. Take off the ice if your skin turns bright red. This is very important. If you cannot feel pain, heat, or cold, you have a greater risk of damage to the area. Raise the painful joint above the level of your heart as often as you can. Rest the joint as much as possible. If the joint is in your leg, you may be given crutches. Follow instructions from your doctor about what you cannot eat or drink. Avoiding future gout attacks Eat a low-purine diet. Avoid foods and drinks such as: Liver. Kidney. Anchovies. Asparagus. Herring. Mushrooms. Mussels. Beer. Stay at a healthy weight. If you want to lose weight,  talk with your doctor. Do not lose weight too fast. Start or continue an exercise plan as told by your doctor. Eating and drinking Avoid drinks sweetened by fructose. Drink enough fluids to keep your pee (urine) pale yellow. If you drink alcohol: Limit how much you have to: 0-1 drink a day for women who are not pregnant. 0-2 drinks a day for men. Know how much alcohol is in a drink. In the [place] one drink equals one 12 oz bottle of beer (355 mL), one 5 oz glass of wine (148 mL), or one 1 oz glass of hard liquor (44 mL). General instructions Take over-the-counter and prescription medicines only as told by your doctor. Ask your doctor if you should avoid driving or using machines while you are taking your medicine. Return to your normal activities when your doctor says that it is safe. Keep all follow-up visits. Where to find more information [employer]: www.niams.http://www.myers.net/ Contact a doctor if: You have another gout attack. You still have symptoms of a gout attack  after 10 days of treatment. You have problems (side effects) because of your medicines. You have chills or a fever. You have burning pain when you pee (urinate). You have pain in your lower back or belly. Get help right away if: You have very bad pain. Your pain cannot be controlled. You cannot pee. Summary Gout is painful swelling of the joints. The most common site of pain is the big toe, but it can affect other joints. Medicines and avoiding some foods can help to prevent and treat gout attacks. This information is not intended to replace advice given to you by your health care provider. Make sure you discuss any questions you have with your health care provider. Document Revised: 06/16/2021 Document Reviewed: 06/16/2021 Elsevier Patient Education  2024 Elsevier Inc.   Next appointment: Follow up in one year for your annual wellness visit 02/18/2024   Preventive Care 65 Years and Older,  Female Preventive care refers to lifestyle choices and visits with your health care provider that can promote health and wellness. What does preventive care include? A yearly physical exam. This is also called an annual well check. Dental exams once or twice a year. Routine eye exams. Ask your health care provider how often you should have your eyes checked. Personal lifestyle choices, including: Daily care of your teeth and gums. Regular physical activity. Eating a healthy diet. Avoiding tobacco and drug use. Limiting alcohol use. Practicing safe sex. Taking low-dose aspirin every day. Taking vitamin and mineral supplements as recommended by your health care provider. What happens during an annual well check? The services and screenings done by your health care provider during your annual well check will depend on your age, overall health, lifestyle risk factors, and family history of disease. Counseling  Your health care provider may ask you questions about your: Alcohol use. Tobacco use. Drug use. Emotional well-being. Home and relationship well-being. Sexual activity. Eating habits. History of falls. Memory and ability to understand (cognition). Work and work Astronomer. Reproductive health. Screening  You may have the following tests or measurements: Height, weight, and BMI. Blood pressure. Lipid and cholesterol levels. These may be checked every 5 years, or more frequently if you are over 55 years old. Skin check. Lung cancer screening. You may have this screening every year starting at age 14 if you have a 30-pack-year history of smoking and currently smoke or have quit within the past 15 years. Fecal occult blood test (FOBT) of the stool. You may have this test every year starting at age 25. Flexible sigmoidoscopy or colonoscopy. You may have a sigmoidoscopy every 5 years or a colonoscopy every 10 years starting at age 40. Hepatitis C blood test. Hepatitis B blood  test. Sexually transmitted disease (STD) testing. Diabetes screening. This is done by checking your blood sugar (glucose) after you have not eaten for a while (fasting). You may have this done every 1-3 years. Bone density scan. This is done to screen for osteoporosis. You may have this done starting at age 55. Mammogram. This may be done every 1-2 years. Talk to your health care provider about how often you should have regular mammograms. Talk with your health care provider about your test results, treatment options, and if necessary, the need for more tests. Vaccines  Your health care provider may recommend certain vaccines, such as: Influenza vaccine. This is recommended every year. Tetanus, diphtheria, and acellular pertussis (Tdap, Td) vaccine. You may need a Td booster every 10 years. Zoster vaccine. You may  need this after age 85. Pneumococcal 13-valent conjugate (PCV13) vaccine. One dose is recommended after age 60. Pneumococcal polysaccharide (PPSV23) vaccine. One dose is recommended after age 73. Talk to your health care provider about which screenings and vaccines you need and how often you need them. This information is not intended to replace advice given to you by your health care provider. Make sure you discuss any questions you have with your health care provider. Document Released: 10/09/2015 Document Revised: 06/01/2016 Document Reviewed: 07/14/2015 Elsevier Interactive Patient Education  2017 [employer].  Fall Prevention in the Home Falls can cause injuries. They can happen to people of all ages. There are many things you can do to make your home safe and to help prevent falls. What can I do on the outside of my home? Regularly fix the edges of walkways and driveways and fix any cracks. Remove anything that might make you trip as you walk through a door, such as a raised step or threshold. Trim any bushes or trees on the path to your home. Use bright outdoor  lighting. Clear any walking paths of anything that might make someone trip, such as rocks or tools. Regularly check to see if handrails are loose or broken. Make sure that both sides of any steps have handrails. Any raised decks and porches should have guardrails on the edges. Have any leaves, snow, or ice cleared regularly. Use sand or salt on walking paths during winter. Clean up any spills in your garage right away. This includes oil or grease spills. What can I do in the bathroom? Use night lights. Install grab bars by the toilet and in the tub and shower. Do not use towel bars as grab bars. Use non-skid mats or decals in the tub or shower. If you need to sit down in the shower, use a plastic, non-slip stool. Keep the floor dry. Clean up any water that spills on the floor as soon as it happens. Remove soap buildup in the tub or shower regularly. Attach bath mats securely with double-sided non-slip rug tape. Do not have throw rugs and other things on the floor that can make you trip. What can I do in the bedroom? Use night lights. Make sure that you have a light by your bed that is easy to reach. Do not use any sheets or blankets that are too big for your bed. They should not hang down onto the floor. Have a firm chair that has side arms. You can use this for support while you get dressed. Do not have throw rugs and other things on the floor that can make you trip. What can I do in the kitchen? Clean up any spills right away. Avoid walking on wet floors. Keep items that you use a lot in easy-to-reach places. If you need to reach something above you, use a strong step stool that has a grab bar. Keep electrical cords out of the way. Do not use floor polish or wax that makes floors slippery. If you must use wax, use non-skid floor wax. Do not have throw rugs and other things on the floor that can make you trip. What can I do with my stairs? Do not leave any items on the stairs. Make  sure that there are handrails on both sides of the stairs and use them. Fix handrails that are broken or loose. Make sure that handrails are as long as the stairways. Check any carpeting to make sure that it is firmly attached  to the stairs. Fix any carpet that is loose or worn. Avoid having throw rugs at the top or bottom of the stairs. If you do have throw rugs, attach them to the floor with carpet tape. Make sure that you have a light switch at the top of the stairs and the bottom of the stairs. If you do not have them, ask someone to add them for you. What else can I do to help prevent falls? Wear shoes that: Do not have high heels. Have rubber bottoms. Are comfortable and fit you well. Are closed at the toe. Do not wear sandals. If you use a stepladder: Make sure that it is fully opened. Do not climb a closed stepladder. Make sure that both sides of the stepladder are locked into place. Ask someone to hold it for you, if possible. Clearly mark and make sure that you can see: Any grab bars or handrails. First and last steps. Where the edge of each step is. Use tools that help you move around (mobility aids) if they are needed. These include: Canes. Walkers. Scooters. Crutches. Turn on the lights when you go into a dark area. Replace any light bulbs as soon as they burn out. Set up your furniture so you have a clear path. Avoid moving your furniture around. If any of your floors are uneven, fix them. If there are any pets around you, be aware of where they are. Review your medicines with your doctor. Some medicines can make you feel dizzy. This can increase your chance of falling. Ask your doctor what other things that you can do to help prevent falls. This information is not intended to replace advice given to you by your health care provider. Make sure you discuss any questions you have with your health care provider. Document Released: 07/09/2009 Document Revised: 02/18/2016  Document Reviewed: 10/17/2014 Elsevier Interactive Patient Education  2017 [employer].

## 2023-04-20 ENCOUNTER — Telehealth: Payer: Self-pay | Admitting: *Deleted

## 2023-04-20 NOTE — Telephone Encounter (Signed)
I connected with Caitlin Jenkins on 7/25 at 1111 by telephone and verified that I am speaking with the correct person using two identifiers. According to the patient's chart they are due for follow up  with [facility]. Pt will call back she was driving at the time and didn't have her calendar. Nothing further was needed at the end of our conversation.

## 2023-07-20 ENCOUNTER — Telehealth: Payer: Self-pay | Admitting: *Deleted

## 2023-07-20 NOTE — Patient Outreach (Signed)
Care Coordination   07/20/2023 Name: Caitlin Jenkins MRN: [id number] DOB: 1931-12-21   Care Coordination Outreach Attempts:  An unsuccessful telephone outreach was attempted today to offer the patient information about available care coordination services.  Follow Up Plan:  Additional outreach attempts will be made to offer the patient care coordination information and services.   Encounter Outcome:  No Answer   Care Coordination Interventions:  No, not indicated    Kemper Durie RN, MSN, CCM [facility], [facility] RN Care Coordinator Direct Dial: [phone] / Main [phone] Fax [phone] Email: [email or web address] Website: [email or web address]

## 2023-07-24 ENCOUNTER — Telehealth: Payer: Self-pay | Admitting: *Deleted

## 2023-07-24 NOTE — Patient Outreach (Signed)
Care Coordination   Initial Visit Note   07/24/2023 Name: Caitlin Jenkins MRN: [id number] DOB: [birth year]  Caitlin Jenkins is a 89 y.o. year old female who sees Caitlin Mail, DO for primary care. I spoke with Caitlin Jenkins, daughter of Caitlin Jenkins by phone today.  What matters to the patients health and wellness today?  Per daughter, patient is no longer a patient at [facility] and does not need any care coordination services.     SDOH assessments and interventions completed:  No     Care Coordination Interventions:  No, not indicated   Follow up plan: No further intervention required.   Encounter Outcome:  Patient Refused   Caitlin Durie RN, MSN, CCM [place]  [facility], [facility] RN Care Coordinator Direct Dial: [phone] / Main [phone] Fax [phone] Email: [email or web address] Website: [email or web address]

## 2023-07-24 NOTE — Patient Outreach (Signed)
Care Coordination   07/24/2023 Name: Caitlin Jenkins MRN: [id number] DOB: [birth year]   Care Coordination Outreach Attempts:  A second unsuccessful outreach was attempted today to offer the patient with information about available care coordination services.  Follow Up Plan:  Additional outreach attempts will be made to offer the patient care coordination information and services.   Encounter Outcome:  No Answer   Care Coordination Interventions:  No, not indicated    Kemper Durie RN, MSN, CCM [facility], [facility] RN Care Coordinator Direct Dial: [phone] / Main [phone] Fax [phone] Email: [email or web address] Website: [email or web address]

## 2023-07-25 ENCOUNTER — Other Ambulatory Visit: Payer: Self-pay | Admitting: Gastroenterology

## 2023-08-08 DIAGNOSIS — N2581 Secondary hyperparathyroidism of renal origin: Secondary | ICD-10-CM | POA: Diagnosis not present

## 2023-08-08 DIAGNOSIS — R8290 Unspecified abnormal findings in urine: Secondary | ICD-10-CM | POA: Diagnosis not present

## 2023-08-08 DIAGNOSIS — N1831 Chronic kidney disease, stage 3a: Secondary | ICD-10-CM | POA: Diagnosis not present

## 2023-08-08 DIAGNOSIS — I129 Hypertensive chronic kidney disease with stage 1 through stage 4 chronic kidney disease, or unspecified chronic kidney disease: Secondary | ICD-10-CM | POA: Diagnosis not present

## 2023-08-10 DIAGNOSIS — N1831 Chronic kidney disease, stage 3a: Secondary | ICD-10-CM | POA: Diagnosis not present

## 2023-08-10 DIAGNOSIS — I129 Hypertensive chronic kidney disease with stage 1 through stage 4 chronic kidney disease, or unspecified chronic kidney disease: Secondary | ICD-10-CM | POA: Diagnosis not present

## 2023-08-14 ENCOUNTER — Encounter: Payer: Self-pay | Admitting: Gastroenterology

## 2023-08-14 ENCOUNTER — Ambulatory Visit: Payer: PPO | Admitting: Gastroenterology

## 2023-08-14 VITALS — BP 157/84 | HR 76 | Temp 97.7°F | Wt 148.0 lb

## 2023-08-14 DIAGNOSIS — K743 Primary biliary cirrhosis: Secondary | ICD-10-CM

## 2023-08-14 MED ORDER — URSODIOL 300 MG PO CAPS: 300.0000 mg | ORAL_CAPSULE | Freq: Every day | ORAL | 3 refills | Status: DC

## 2023-08-14 NOTE — Progress Notes (Signed)
Primary Care Physician: Emily Filbert, MD  Primary Gastroenterologist:  Dr. Midge Minium  Chief Complaint  Patient presents with   Follow-up   Primary biliary cholangitis    HPI: Caitlin Jenkins is a 89 y.o. female here with a history of PBC.  The patient has had normal liver enzymes and is doing well without any issues at the present time.  The patient is in need of a refill of her medication.  There is no report of any jaundice abdominal pain nausea vomiting or dark urine.  Past Medical History:  Diagnosis Date   Biliary cirrhosis (HCC)    Cancer of skin of leg    Cellulitis    CKD (chronic kidney disease) stage 3, GFR 30-59 ml/min (HCC)    Gout    Hiatal hernia Oct 2015   8.5cm   Hiatal hernia 06/26/2014   8.5cm    History of basal cell carcinoma (BCC) 09/12/2018   Oct 2016; Moh's surgery; right antitragus   History of diverticulitis 2000   Hyperlipidemia    Hyperparathyroidism, secondary renal (HCC) 07/11/2018   Managed by nephrologist   Hypertension    Hypomagnesemia 07/03/2019   Hypothyroidism    Skin cancer of nose     Current Outpatient Medications  Medication Sig Dispense Refill   acetaminophen (TYLENOL) 500 MG tablet Take 500 mg by mouth every 6 (six) hours as needed.     allopurinol (ZYLOPRIM) 300 MG tablet Take 300 mg by mouth daily.     amLODipine (NORVASC) 2.5 MG tablet Take 1 tablet (2.5 mg total) by mouth daily. 90 tablet 1   Cholecalciferol (VITAMIN D-3) 25 MCG (1000 UT) CAPS Take 1 capsule by mouth daily.     COLACE 100 MG capsule Take 100 mg by mouth daily.      Colchicine 0.6 MG CAPS Take 0.6 mg by mouth daily as needed. 30 capsule 0   levothyroxine (SYNTHROID) 75 MCG tablet Take 1 tablet (75 mcg total) by mouth daily. 90 tablet 0   losartan (COZAAR) 100 MG tablet Take 1 tablet by mouth daily (Avoid salt substitutes, no fruit smoothies.) 90 tablet 3   meclizine (ANTIVERT) 25 MG tablet Take 1 tablet (25 mg total) by mouth as needed for  dizziness. 20 tablet 0   Turmeric 450 MG CAPS Take 2 capsules by mouth daily.     ursodiol (ACTIGALL) 300 MG capsule Take 1 capsule (300 mg total) by mouth daily. MUST SCHEDULE OFFICE VISIT 90 capsule 0   Zinc 50 MG CAPS Take by mouth.     No current facility-administered medications for this visit.    Allergies as of 08/14/2023 - Review Complete 08/14/2023  Allergen Reaction Noted   Indomethacin Hives 10/14/2014   Levofloxacin in d5w Other (See Comments) 02/18/2021   Ace inhibitors Swelling 09/25/2014   Augmentin [amoxicillin-pot clavulanate] Other (See Comments) 03/12/2015   Elemental sulfur Other (See Comments) 03/13/2015   Penicillins Nausea And Vomiting 03/12/2015   Sulfa antibiotics Swelling and Other (See Comments) 07/03/2019    ROS:  General: Negative for anorexia, weight loss, fever, chills, fatigue, weakness. ENT: Negative for hoarseness, difficulty swallowing , nasal congestion. CV: Negative for chest pain, angina, palpitations, dyspnea on exertion, peripheral edema.  Respiratory: Negative for dyspnea at rest, dyspnea on exertion, cough, sputum, wheezing.  GI: See history of present illness. GU:  Negative for dysuria, hematuria, urinary incontinence, urinary frequency, nocturnal urination.  Endo: Negative for unusual weight change.    Physical Examination:  BP (!) 157/84 (BP Location: Left Arm, Patient Position: Sitting, Cuff Size: Normal)   Pulse 76   Temp 97.7 F (36.5 C) (Oral)   Wt 148 lb (67.1 kg)   BMI 25.40 kg/m   General: Well-nourished, well-developed in no acute distress.  Eyes: No icterus. Conjunctivae pink. Extremities: No lower extremity edema. No clubbing or deformities. Neuro: Alert and oriented x 3.  Grossly intact. Skin: Warm and dry, no jaundice.   Psych: Alert and cooperative, normal mood and affect.  Labs:    Imaging Studies: No results found.  Assessment and Plan:   Caitlin Jenkins is a 89 y.o. y/o female who comes in today with  a history of PBC doing well on her medication.  The patient will have her labs checked again today since I have not had liver enzymes since last year.  The patient has been given a refill of her medication.  She has been told to contact me if she has any further issues.  The patient has been explained the plan and agrees with it.     Midge Minium, MD. Clementeen Graham    Note: This dictation was prepared with Dragon dictation along with smaller phrase technology. Any transcriptional errors that result from this process are unintentional.

## 2023-08-15 LAB — HEPATIC FUNCTION PANEL
ALT: 8 [IU]/L (ref 0–32)
AST: 17 [IU]/L (ref 0–40)
Albumin: 4.3 g/dL (ref 3.7–4.7)
Alkaline Phosphatase: 128 [IU]/L — ABNORMAL HIGH (ref 44–121)
Bilirubin Total: 0.3 mg/dL (ref 0.0–1.2)
Bilirubin, Direct: 0.11 mg/dL (ref 0.00–0.40)
Total Protein: 6.5 g/dL (ref 6.0–8.5)

## 2023-08-31 ENCOUNTER — Telehealth: Payer: Self-pay | Admitting: Gastroenterology

## 2023-08-31 NOTE — Telephone Encounter (Signed)
Patient called in left a voicemail requesting the time to come to get so labs. I called the patient back to inform her we receive her message, and the patient stated that she is coming on 09/05/23.

## 2023-09-07 ENCOUNTER — Other Ambulatory Visit: Payer: Self-pay

## 2023-09-07 DIAGNOSIS — K743 Primary biliary cirrhosis: Secondary | ICD-10-CM | POA: Diagnosis not present

## 2023-09-08 LAB — HEPATIC FUNCTION PANEL
ALT: 9 [IU]/L (ref 0–32)
AST: 38 [IU]/L (ref 0–40)
Albumin: 4.2 g/dL (ref 3.7–4.7)
Alkaline Phosphatase: 135 [IU]/L — ABNORMAL HIGH (ref 44–121)
Bilirubin Total: 0.4 mg/dL (ref 0.0–1.2)
Bilirubin, Direct: <0.08 mg/dL (ref 0.00–0.40)
Total Protein: 7.3 g/dL (ref 6.0–8.5)

## 2023-09-11 DIAGNOSIS — H02886 Meibomian gland dysfunction of left eye, unspecified eyelid: Secondary | ICD-10-CM | POA: Diagnosis not present

## 2023-09-11 DIAGNOSIS — H02883 Meibomian gland dysfunction of right eye, unspecified eyelid: Secondary | ICD-10-CM | POA: Diagnosis not present

## 2023-09-11 DIAGNOSIS — H3581 Retinal edema: Secondary | ICD-10-CM | POA: Diagnosis not present

## 2023-09-11 DIAGNOSIS — H34832 Tributary (branch) retinal vein occlusion, left eye, with macular edema: Secondary | ICD-10-CM | POA: Diagnosis not present

## 2023-09-11 DIAGNOSIS — H524 Presbyopia: Secondary | ICD-10-CM | POA: Diagnosis not present

## 2023-09-11 DIAGNOSIS — H35033 Hypertensive retinopathy, bilateral: Secondary | ICD-10-CM | POA: Diagnosis not present

## 2023-09-11 DIAGNOSIS — Z961 Presence of intraocular lens: Secondary | ICD-10-CM | POA: Diagnosis not present

## 2023-09-15 MED ORDER — URSODIOL 300 MG PO CAPS: 300.0000 mg | ORAL_CAPSULE | Freq: Three times a day (TID) | ORAL | 1 refills | Status: DC

## 2023-10-09 ENCOUNTER — Telehealth: Payer: Self-pay

## 2023-10-09 DIAGNOSIS — K743 Primary biliary cirrhosis: Secondary | ICD-10-CM

## 2023-10-09 NOTE — Telephone Encounter (Signed)
 Pt is aware that her repeat hep funct panel is due... Pt states she will go to a [facility] drawing station today

## 2023-10-10 ENCOUNTER — Other Ambulatory Visit: Payer: Self-pay | Admitting: Gastroenterology

## 2023-10-10 LAB — HEPATIC FUNCTION PANEL
ALT: 7 [IU]/L (ref 0–32)
AST: 21 [IU]/L (ref 0–40)
Albumin: 4.1 g/dL (ref 3.6–4.6)
Alkaline Phosphatase: 109 [IU]/L (ref 44–121)
Bilirubin Total: 0.4 mg/dL (ref 0.0–1.2)
Bilirubin, Direct: 0.09 mg/dL (ref 0.00–0.40)
Total Protein: 6.5 g/dL (ref 6.0–8.5)

## 2023-10-20 ENCOUNTER — Other Ambulatory Visit: Payer: Self-pay | Admitting: Gastroenterology

## 2023-11-21 ENCOUNTER — Telehealth: Payer: Self-pay | Admitting: Gastroenterology

## 2023-11-21 NOTE — Telephone Encounter (Signed)
 The patient called in and left a message requesting to speak to [facility]. I called her to let her know we received her message and I sent the message to the nurse. No answer I left her a voicemail.

## 2023-11-23 NOTE — Telephone Encounter (Signed)
 Left message on voicemail.

## 2023-11-27 NOTE — Telephone Encounter (Signed)
 I spoke to pt and she had questions regarding the sig on her Rx Ursodiol... Nothing further needed at this time

## 2024-07-13 ENCOUNTER — Encounter: Payer: Self-pay | Admitting: Emergency Medicine

## 2024-07-13 ENCOUNTER — Ambulatory Visit
Admission: EM | Admit: 2024-07-13 | Discharge: 2024-07-13 | Disposition: A | Discharge status: Home / Self Care | Attending: Emergency Medicine | Admitting: Emergency Medicine

## 2024-07-13 DIAGNOSIS — N390 Urinary tract infection, site not specified: Secondary | ICD-10-CM | POA: Diagnosis present

## 2024-07-13 LAB — URINALYSIS, W/ REFLEX TO CULTURE (INFECTION SUSPECTED)
Bilirubin Urine: NEGATIVE
Glucose, UA: NEGATIVE mg/dL
Ketones, ur: NEGATIVE mg/dL
Nitrite: POSITIVE — AB
Protein, ur: 100 mg/dL — AB
Specific Gravity, Urine: 1.015 (ref 1.005–1.030)
WBC, UA: >50 WBC/hpf (ref 0–5)
pH: 6 (ref 5.0–8.0)

## 2024-07-13 MED ORDER — CEFDINIR 300 MG PO CAPS: 300.0000 mg | ORAL_CAPSULE | Freq: Two times a day (BID) | ORAL | 0 refills | Status: AC

## 2024-07-13 NOTE — Discharge Instructions (Addendum)
Take the cefdinir twice daily for 7 days with food for treatment of urinary tract infection.  Increase your oral fluid intake so that you increase your urine production and or flushing your urinary system.  Take an over-the-counter probiotic, such as Culturelle-Align-Activia, 1 hour after each dose of antibiotic to prevent diarrhea or yeast infections from forming.  We will culture urine and change the antibiotics if necessary.  Return for reevaluation, or see your primary care provider, for any new or worsening symptoms.

## 2024-07-13 NOTE — ED Provider Notes (Signed)
 [facility]    CSN: [id number] Arrival date & time: 07/13/24  1055      History   Chief Complaint Chief Complaint  Patient presents with   Urinary Tract Infection    HPI Caitlin Jenkins is a 89 y.o. female.   HPI  89 year old female with past medical history significant for hyperlipidemia, hypothyroidism, gout, chronic kidney disease stage III, hepatic cirrhosis, essential hypertension, carotid artery disease, hypoparathyroidism presents for evaluation of UTI symptoms that began yesterday.  She reports that she has had some burning with urination along with urgency and frequency.  She denies any fever but reports that last night she was unable to get warm.  She did not measure her temperature.  She denies any low back pain or blood in her urine.  Past Medical History:  Diagnosis Date   Biliary cirrhosis (HCC)    Cancer of skin of leg    Cellulitis    CKD (chronic kidney disease) stage 3, GFR 30-59 ml/min (HCC)    Gout    Hiatal hernia Oct 2015   8.5cm   Hiatal hernia 06/26/2014   8.5cm    History of basal cell carcinoma (BCC) 09/12/2018   Oct 2016; Moh's surgery; right antitragus   History of diverticulitis 2000   Hyperlipidemia    Hyperparathyroidism, secondary renal 07/11/2018   Managed by nephrologist   Hypertension    Hypomagnesemia 07/03/2019   Hypothyroidism    Skin cancer of nose     Patient Active Problem List   Diagnosis Date Noted   Malnutrition of moderate degree 03/31/2021   Fall 03/14/2021   SDH (subdural hematoma) 03/14/2021   History of basal cell carcinoma (BCC) 09/12/2018   Hyperparathyroidism, secondary renal 07/11/2018   Primary biliary cholangitis (HCC) 03/20/2017   Overweight (BMI 25.0-29.9) 11/29/2015   Carotid artery disease 10/09/2015   Nail abnormalities 10/09/2015   Arthralgia of multiple joints 10/09/2015   Essential hypertension, benign 06/19/2015   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HCC)  03/13/2015   Hyperlipidemia    Hypothyroidism    Gout    CKD (chronic kidney disease) stage 3, GFR 30-59 ml/min (HCC)     Past Surgical History:  Procedure Laterality Date   ABDOMINAL HYSTERECTOMY  1995   CATARACT EXTRACTION     FOOT SURGERY  2000   HEMORRHOID SURGERY     THYROID  SURGERY  1999   para thyroid     OB History   No obstetric history on file.      Home Medications    Prior to Admission medications   Medication Sig Start Date End Date Taking? Authorizing Provider  cefdinir  (OMNICEF ) 300 MG capsule Take 1 capsule (300 mg total) by mouth 2 (two) times daily for 7 days. 07/13/24 07/20/24 Yes Bernardino Ditch, NP  acetaminophen  (TYLENOL ) 500 MG tablet Take 500 mg by mouth every 6 (six) hours as needed.    [provider]  allopurinol  (ZYLOPRIM ) 300 MG tablet Take 300 mg by mouth daily. 12/30/22   [provider]  amLODipine  (NORVASC ) 2.5 MG tablet Take 1 tablet (2.5 mg total) by mouth daily. 05/02/22   Bernardo Fend, DO  Cholecalciferol  (VITAMIN D -3) 25 MCG (1000 UT) CAPS Take 1 capsule by mouth daily.    [provider]  COLACE 100 MG capsule Take 100 mg by mouth daily.  01/26/15   [provider]  Colchicine  0.6 MG CAPS Take 0.6 mg by mouth daily as needed. 08/15/22   Bernardo Fend, DO  levothyroxine  (SYNTHROID ) 75 MCG tablet Take 1 tablet (75 mcg total) by mouth daily. 08/11/22   Bernardo Fend, DO  losartan  (COZAAR ) 100 MG tablet Take 1 tablet by mouth daily (Avoid salt substitutes, no fruit smoothies.) 11/02/21   Daphane Rosella, NP  meclizine  (ANTIVERT ) 25 MG tablet Take 1 tablet (25 mg total) by mouth as needed for dizziness. 05/02/22   Bernardo Fend, DO  Turmeric 450 MG CAPS Take 2 capsules by mouth daily.    [provider]  ursodiol  (ACTIGALL ) 300 MG capsule TAKE 1 CAPSULE BY MOUTH THREE TIMES A DAY 10/10/23   Jinny Carmine, MD  Zinc 50 MG CAPS Take by mouth.    [provider]    Family History Family  History  Problem Relation Age of Onset   Emphysema Father    Asthma Father    Hypertension Daughter    Heart disease Daughter        3 stents   Diabetes Daughter    Hypertension Son    Cancer Son        prostate   Diabetes Son    Heart disease Daughter        heart attack, 2 stents   Multiple sclerosis Daughter    Diabetes Daughter    Fibromyalgia Daughter    Diabetes Daughter     Social History Social History   Tobacco Use   Smoking status: Never   Smokeless tobacco: Never   Tobacco comments:    smoking cessation materials not required  Vaping Use   Vaping status: Never Used  Substance Use Topics   Alcohol use: No   Drug use: No     Allergies   Indomethacin, Levofloxacin  in d5w, Ace inhibitors, Augmentin [amoxicillin-pot clavulanate], Elemental sulfur, Penicillins, and Sulfa  antibiotics   Review of Systems Review of Systems  Constitutional:  Positive for chills. Negative for fever.  Genitourinary:  Positive for dysuria, frequency and urgency. Negative for hematuria.  Musculoskeletal:  Negative for back pain.     Physical Exam Triage Vital Signs ED Triage Vitals  Encounter Vitals Group     BP      Girls Systolic BP Percentile      Girls Diastolic BP Percentile      Boys Systolic BP Percentile      Boys Diastolic BP Percentile      Pulse      Resp      Temp      Temp src      SpO2      Weight      Height      Head Circumference      Peak Flow      Pain Score      Pain Loc      Pain Education      Exclude from Growth Chart    No data found.  Updated Vital Signs BP (!) 165/104 (BP Location: Left Arm)   Pulse 81   Temp 98.4 F (36.9 C) (Oral)   SpO2 100%   Visual Acuity Right Eye Distance:   Left Eye Distance:   Bilateral Distance:    Right Eye Near:   Left Eye Near:    Bilateral Near:     Physical Exam Vitals and nursing note reviewed.  Constitutional:      Appearance: Normal appearance. She is not ill-appearing.  HENT:      Head: Normocephalic and atraumatic.  Cardiovascular:     Rate and Rhythm: Normal rate and regular rhythm.  Pulses: Normal pulses.     Heart sounds: Normal heart sounds. No murmur heard.    No friction rub. No gallop.  Pulmonary:     Effort: Pulmonary effort is normal.     Breath sounds: Normal breath sounds. No wheezing, rhonchi or rales.  Abdominal:     Tenderness: There is no right CVA tenderness or left CVA tenderness.  Skin:    General: Skin is warm and dry.     Capillary Refill: Capillary refill takes less than 2 seconds.     Findings: No rash.  Neurological:     General: No focal deficit present.     Mental Status: She is alert and oriented to person, place, and time.      UC Treatments / Results  Labs (all labs ordered are listed, but only abnormal results are displayed) Labs Reviewed  URINALYSIS, W/ REFLEX TO CULTURE (INFECTION SUSPECTED) - Abnormal; Notable for the following components:      Result Value   APPearance CLOUDY (*)    Hgb urine dipstick LARGE (*)    Protein, ur 100 (*)    Nitrite POSITIVE (*)    Leukocytes,Ua LARGE (*)    Bacteria, UA FEW (*)    All other components within normal limits  URINE CULTURE    EKG   Radiology No results found.  Procedures Procedures (including critical care time)  Medications Ordered in UC Medications - No data to display  Initial Impression / Assessment and Plan / UC Course  I have reviewed the triage vital signs and the nursing notes.  Pertinent labs & imaging results that were available during my care of the patient were reviewed by me and considered in my medical decision making (see chart for details).   Patient is a pleasant, nontoxic-appearing 89 year old female presenting for evaluation of possible UTI symptoms that began last night.  She reports that typically she does not have any symptoms when she has a urinary tract infection until she wants it being sent to the hospital.  Last night she developed  some slight burning along with urgency and frequency and also an inability to get warm but she did not measure her temperature.  In the exam room she is not in any acute distress.  She has no CVA tenderness on exam.  I will order a urinalysis to assess for the presence of UTI.  Urinalysis is cloudy in appearance with large hemoglobin, 100 protein, nitrite positive with large leukocyte esterase.  Greater than 50 WBCs, 11-20 RBCs, and few bacteria.  Urine will reflex to culture.  I will discharge patient with a diagnosis of UTI and start her on cefdinir  300 mg twice daily for 7 days.  Renal panel from [facility] dated 08/08/2023 shows patient having a creatinine of 1.12.  Calculated creatinine clearance is 31 mL/min which indicates there is no need for dosage adjustment.  While patient has an allergy documented to penicillin that was placed in 2016 that was placed in 2016 the patient has several instances of cefdinir  and ceftriaxone  being prescribed since that time.   Final Clinical Impressions(s) / UC Diagnoses   Final diagnoses:  Lower urinary tract infectious disease     Discharge Instructions      Take the cefdinir  twice daily for 7 days with food for treatment of urinary tract infection.  Increase your oral fluid intake so that you increase your urine production and or flushing your urinary system.  Take an over-the-counter probiotic, such as Culturelle-Align-Activia, 1  hour after each dose of antibiotic to prevent diarrhea or yeast infections from forming.  We will culture urine and change the antibiotics if necessary.  Return for reevaluation, or see your primary care provider, for any new or worsening symptoms.      ED Prescriptions     Medication Sig Dispense Auth. Provider   cefdinir  (OMNICEF ) 300 MG capsule Take 1 capsule (300 mg total) by mouth 2 (two) times daily for 7 days. 14 capsule Bernardino Ditch, NP      PDMP not reviewed this encounter.   Bernardino Ditch,  NP 07/13/24 [phone]

## 2024-07-13 NOTE — ED Triage Notes (Signed)
 Pt c/o urinary sx onset yesterday. She has had some burning. She states she does not have any sx usually when she has a UTI. She felt cold last night.

## 2024-07-15 ENCOUNTER — Ambulatory Visit (HOSPITAL_COMMUNITY): Payer: Self-pay

## 2024-07-15 LAB — URINE CULTURE: Culture: >=100000 — AB

## 2024-10-21 ENCOUNTER — Other Ambulatory Visit: Payer: Self-pay | Admitting: Gastroenterology
